# Patient Record
Sex: Male | Born: 1975 | Race: White | Hispanic: No
Health system: Southern US, Community
[De-identification: ages and names within clinical notes are randomized; demographics above are authoritative.]

## PROBLEM LIST (undated history)

## (undated) DIAGNOSIS — F1910 Other psychoactive substance abuse, uncomplicated: Secondary | ICD-10-CM

## (undated) DIAGNOSIS — R011 Cardiac murmur, unspecified: Secondary | ICD-10-CM

## (undated) DIAGNOSIS — K7460 Unspecified cirrhosis of liver: Secondary | ICD-10-CM

## (undated) DIAGNOSIS — M7110 Other infective bursitis, unspecified site: Secondary | ICD-10-CM

---

## 2012-08-23 ENCOUNTER — Emergency Department: Payer: Self-pay | Admitting: Emergency Medicine

## 2012-08-23 LAB — CBC WITH DIFFERENTIAL/PLATELET
Basophil %: 0.5 %
Eosinophil %: 0.7 %
HCT: 45.4 % (ref 40.0–52.0)
HGB: 16.1 g/dL (ref 13.0–18.0)
Lymphocyte %: 16.6 %
MCH: 32.9 pg (ref 26.0–34.0)
MCHC: 35.4 g/dL (ref 32.0–36.0)
MCV: 93 fL (ref 80–100)
Monocyte %: 10.2 %
Neutrophil #: 5.2 10*3/uL (ref 1.4–6.5)
RBC: 4.89 10*6/uL (ref 4.40–5.90)
WBC: 7.2 10*3/uL (ref 3.8–10.6)

## 2014-06-14 IMAGING — CR RIGHT HAND - COMPLETE 3+ VIEW
1 series · 3 of 3 positions shown · non-contrast
Comparison: none

REASON FOR EXAM: PAIN AND SWELLING, HX OF FB IN HAND 3 DAYS AGO
COMMENTS:

PROCEDURE:     DXR - DXR HAND RT COMPLETE W/OBLIQUES  - August 23, 2012  [DATE]
RESULT:     There is no evidence of fracture, dislocation, or malalignment.

[Series 1: pa · 0.17mm/px · 3 of 3 slices shown]
[im 1/3]
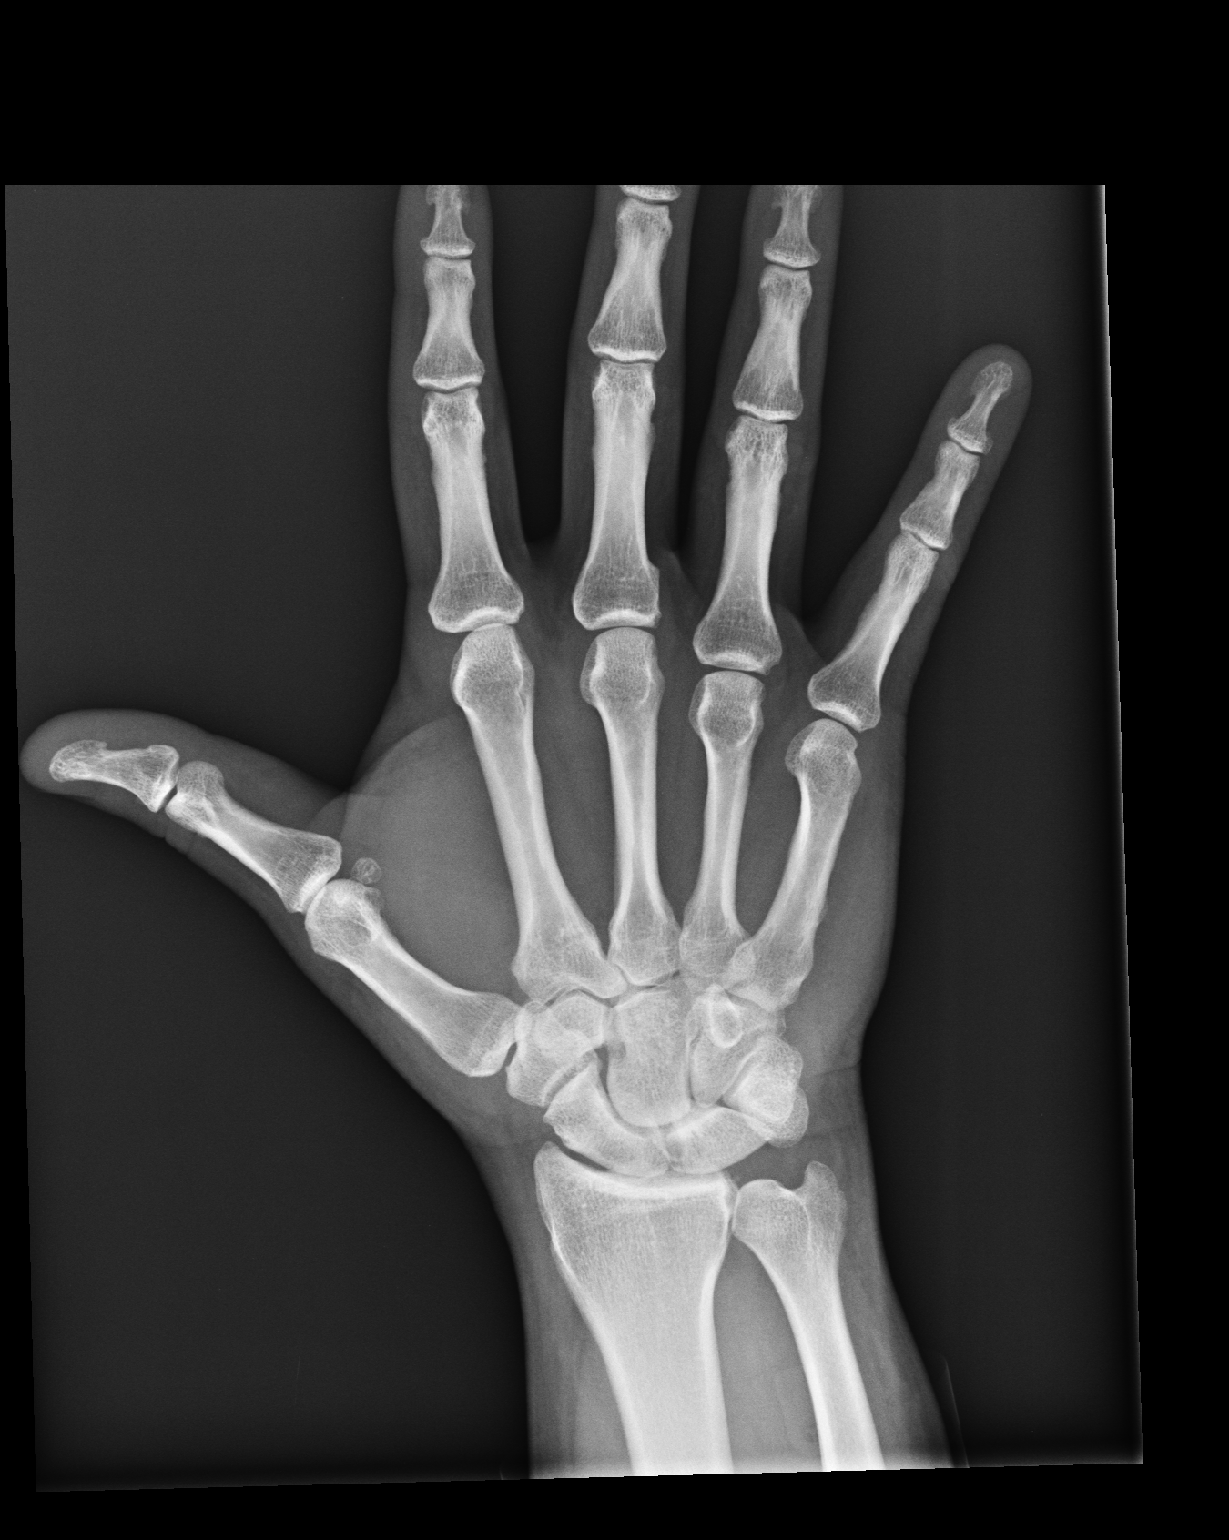
[im 2/3]
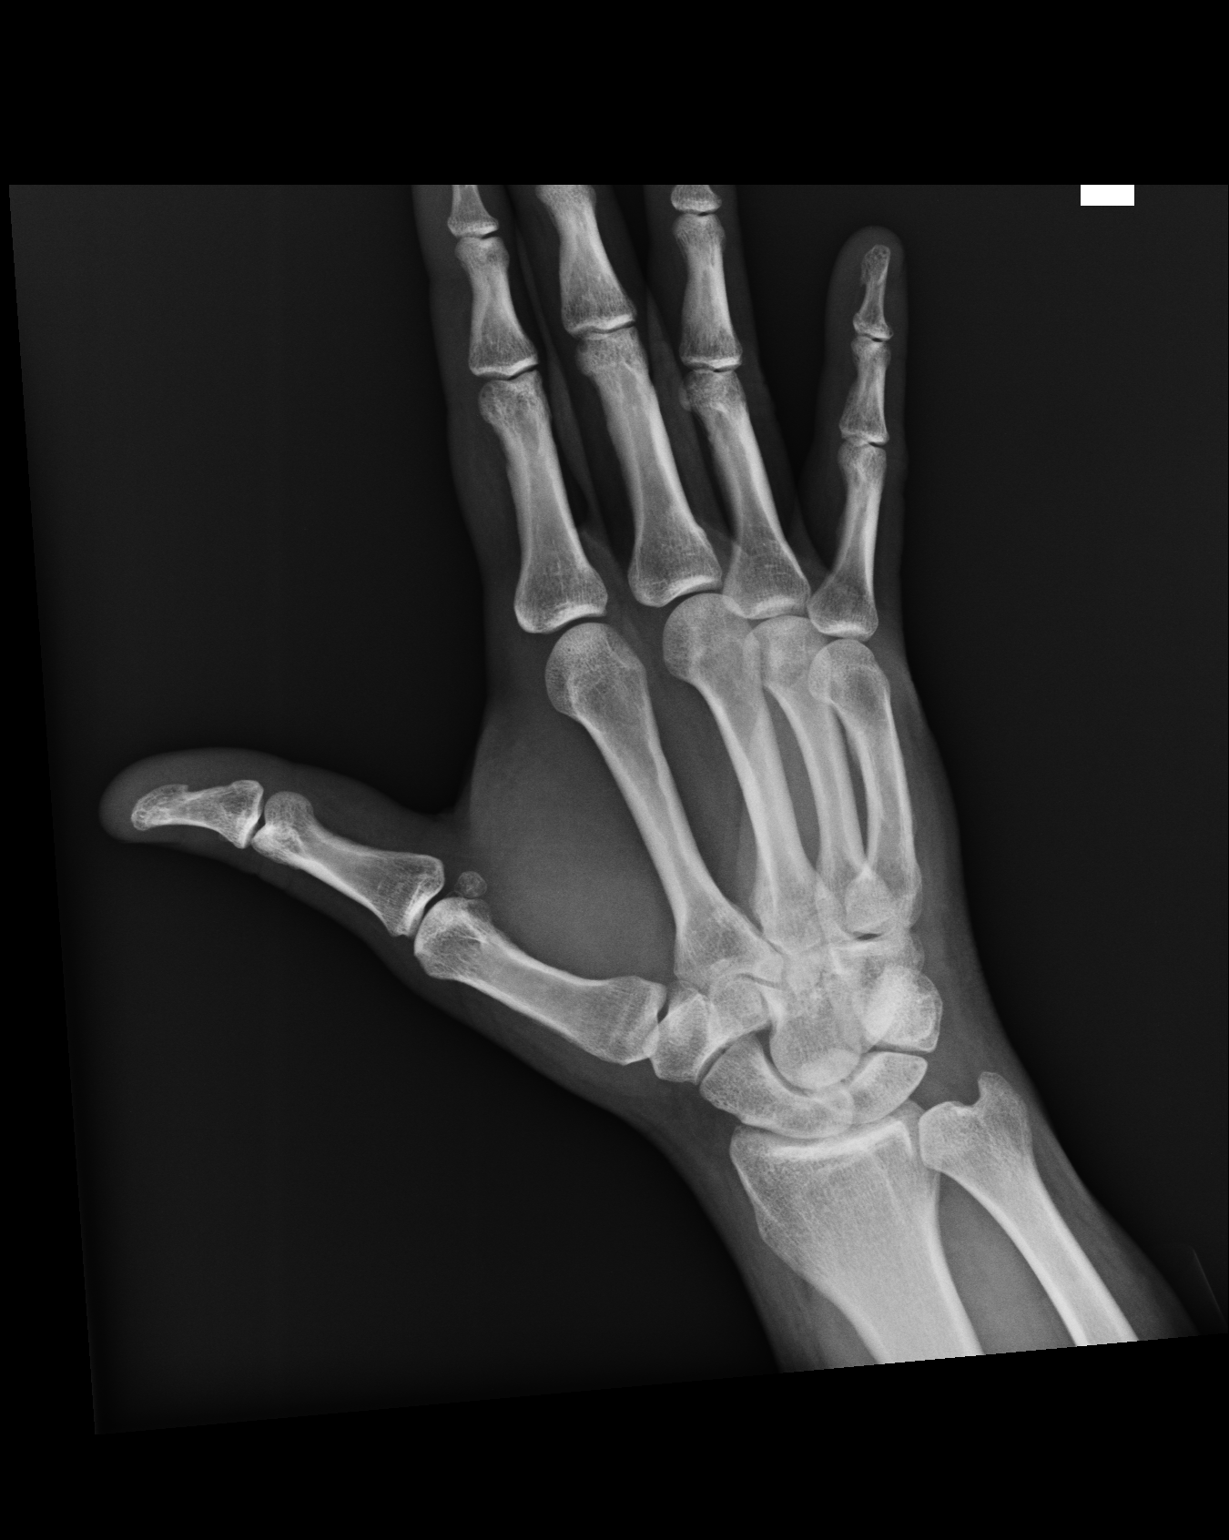
[im 3/3]
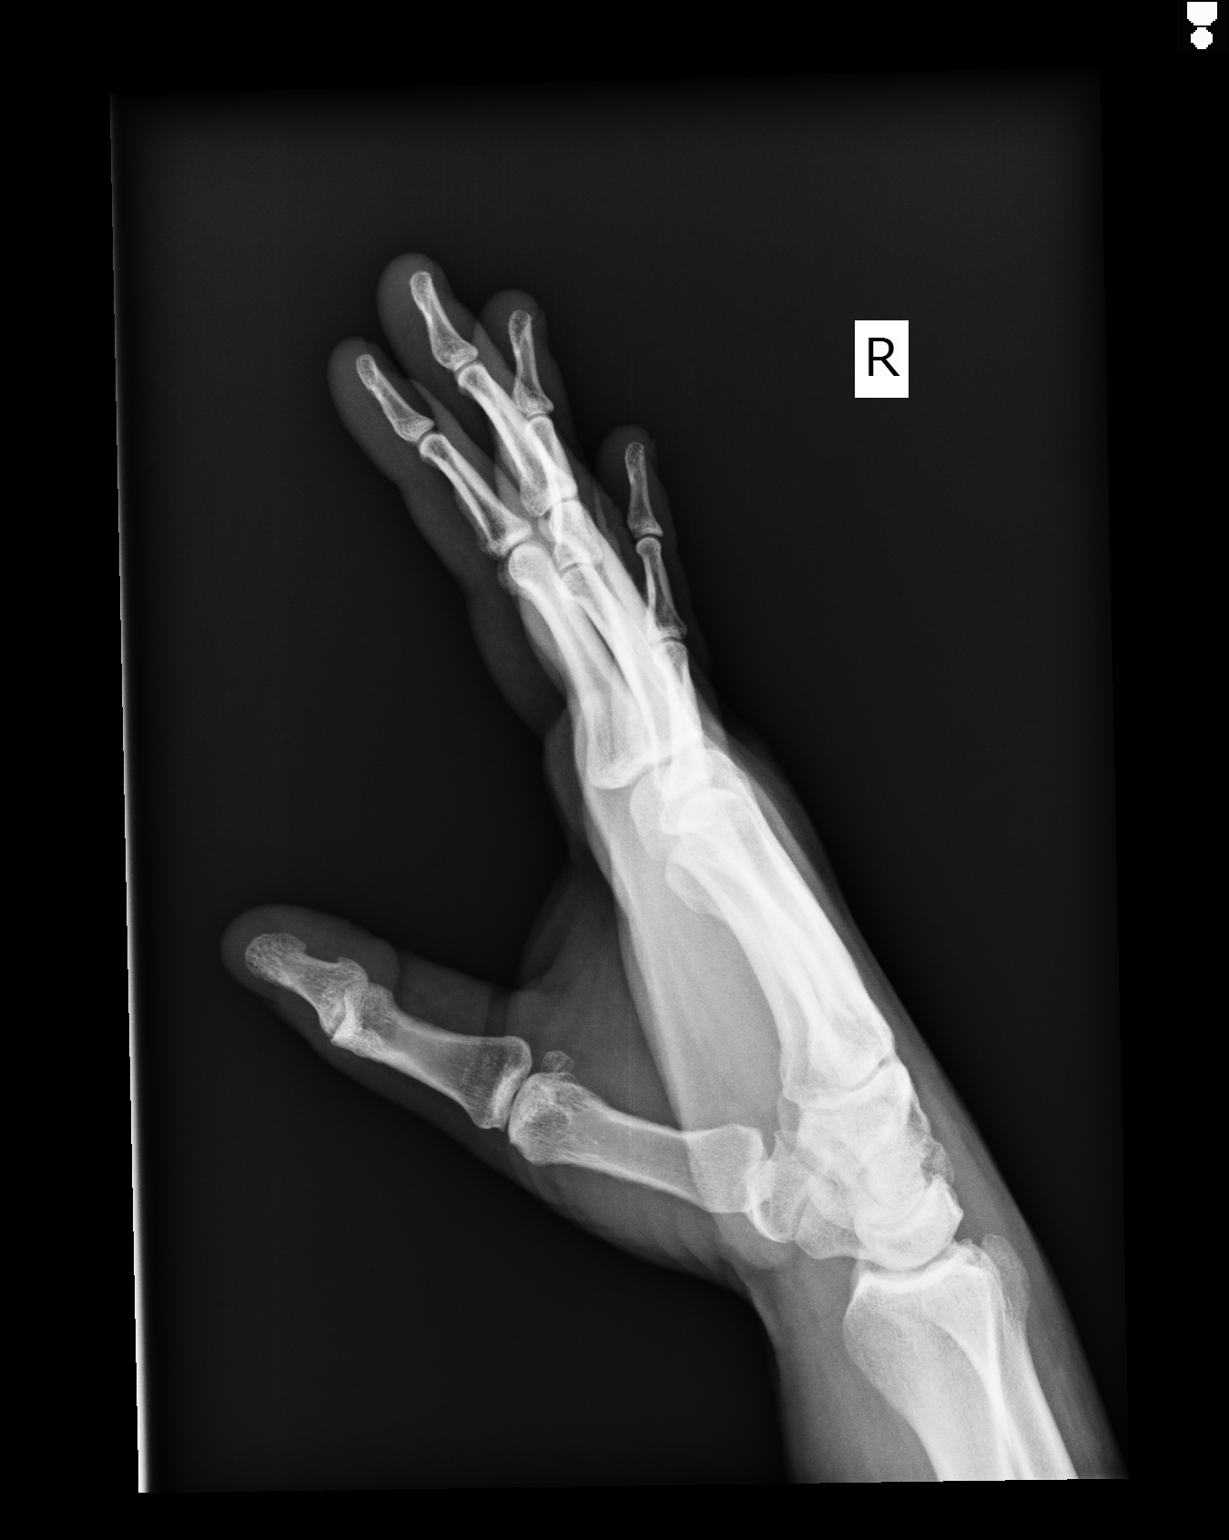

[3 of 3 positions shown; findings below may reference images not displayed]

IMPRESSION: 1. No evidence of acute abnormalities.
2. If there are persistent complaints of pain or persistent clinical
concern, a repeat evaluation in 7-10 days is recommended if clinically
warranted.

## 2022-04-19 ENCOUNTER — Other Ambulatory Visit: Payer: Self-pay

## 2022-04-19 DIAGNOSIS — K7031 Alcoholic cirrhosis of liver with ascites: Secondary | ICD-10-CM | POA: Diagnosis present

## 2022-04-19 DIAGNOSIS — F1010 Alcohol abuse, uncomplicated: Secondary | ICD-10-CM | POA: Diagnosis present

## 2022-04-19 DIAGNOSIS — D61818 Other pancytopenia: Secondary | ICD-10-CM | POA: Diagnosis present

## 2022-04-19 DIAGNOSIS — L03113 Cellulitis of right upper limb: Secondary | ICD-10-CM | POA: Diagnosis present

## 2022-04-19 DIAGNOSIS — Z880 Allergy status to penicillin: Secondary | ICD-10-CM

## 2022-04-19 DIAGNOSIS — K7682 Hepatic encephalopathy: Principal | ICD-10-CM | POA: Diagnosis present

## 2022-04-19 DIAGNOSIS — K8020 Calculus of gallbladder without cholecystitis without obstruction: Secondary | ICD-10-CM | POA: Diagnosis present

## 2022-04-19 DIAGNOSIS — F1210 Cannabis abuse, uncomplicated: Secondary | ICD-10-CM | POA: Diagnosis present

## 2022-04-19 DIAGNOSIS — I482 Chronic atrial fibrillation, unspecified: Secondary | ICD-10-CM | POA: Diagnosis present

## 2022-04-19 DIAGNOSIS — K140 Glossitis: Secondary | ICD-10-CM | POA: Diagnosis present

## 2022-04-19 DIAGNOSIS — F1510 Other stimulant abuse, uncomplicated: Secondary | ICD-10-CM | POA: Diagnosis present

## 2022-04-19 DIAGNOSIS — E876 Hypokalemia: Secondary | ICD-10-CM | POA: Diagnosis present

## 2022-04-19 DIAGNOSIS — F17210 Nicotine dependence, cigarettes, uncomplicated: Secondary | ICD-10-CM | POA: Diagnosis present

## 2022-04-20 ENCOUNTER — Other Ambulatory Visit: Payer: Self-pay

## 2022-04-20 ENCOUNTER — Inpatient Hospital Stay
Admission: EM | Admit: 2022-04-20 | Discharge: 2022-04-22 | DRG: 442 | Disposition: A | Discharge status: Home / Self Care | Payer: Self-pay | Attending: Internal Medicine | Admitting: Internal Medicine

## 2022-04-20 ENCOUNTER — Encounter: Payer: Self-pay | Admitting: Internal Medicine

## 2022-04-20 ENCOUNTER — Emergency Department: Payer: Self-pay

## 2022-04-20 ENCOUNTER — Inpatient Hospital Stay: Admit: 2022-04-20 | Payer: Self-pay

## 2022-04-20 DIAGNOSIS — R600 Localized edema: Principal | ICD-10-CM

## 2022-04-20 DIAGNOSIS — R188 Other ascites: Secondary | ICD-10-CM

## 2022-04-20 DIAGNOSIS — E876 Hypokalemia: Secondary | ICD-10-CM

## 2022-04-20 DIAGNOSIS — K8020 Calculus of gallbladder without cholecystitis without obstruction: Secondary | ICD-10-CM

## 2022-04-20 DIAGNOSIS — D61818 Other pancytopenia: Secondary | ICD-10-CM

## 2022-04-20 DIAGNOSIS — G9340 Encephalopathy, unspecified: Secondary | ICD-10-CM | POA: Diagnosis present

## 2022-04-20 DIAGNOSIS — K7682 Hepatic encephalopathy: Secondary | ICD-10-CM

## 2022-04-20 DIAGNOSIS — D649 Anemia, unspecified: Secondary | ICD-10-CM

## 2022-04-20 DIAGNOSIS — K7031 Alcoholic cirrhosis of liver with ascites: Secondary | ICD-10-CM

## 2022-04-20 HISTORY — DX: Unspecified cirrhosis of liver: K74.60

## 2022-04-20 HISTORY — DX: Cardiac murmur, unspecified: R01.1

## 2022-04-20 LAB — COMPREHENSIVE METABOLIC PANEL
ALT: 69 U/L — ABNORMAL HIGH (ref 0–44)
ALT: 65 U/L — ABNORMAL HIGH (ref 0–44)
AST: 81 U/L — ABNORMAL HIGH (ref 15–41)
AST: 73 U/L — ABNORMAL HIGH (ref 15–41)
Albumin: 2.9 g/dL — ABNORMAL LOW (ref 3.5–5.0)
Albumin: 2.7 g/dL — ABNORMAL LOW (ref 3.5–5.0)
Alkaline Phosphatase: 94 U/L (ref 38–126)
Alkaline Phosphatase: 94 U/L (ref 38–126)
Anion gap: 6 (ref 5–15)
Anion gap: 6 (ref 5–15)
BUN: 7 mg/dL (ref 6–20)
BUN: 6 mg/dL (ref 6–20)
CO2: 25 mmol/L (ref 22–32)
CO2: 26 mmol/L (ref 22–32)
Calcium: 8.3 mg/dL — ABNORMAL LOW (ref 8.9–10.3)
Calcium: 8.3 mg/dL — ABNORMAL LOW (ref 8.9–10.3)
Chloride: 107 mmol/L (ref 98–111)
Chloride: 107 mmol/L (ref 98–111)
Creatinine, Ser: 0.59 mg/dL — ABNORMAL LOW (ref 0.61–1.24)
Creatinine, Ser: 0.57 mg/dL — ABNORMAL LOW (ref 0.61–1.24)
GFR, Estimated: >60 mL/min (ref >60)
GFR, Estimated: >60 mL/min (ref >60)
Glucose, Bld: 142 mg/dL — ABNORMAL HIGH (ref 70–99)
Glucose, Bld: 114 mg/dL — ABNORMAL HIGH (ref 70–99)
Potassium: 2.9 mmol/L — ABNORMAL LOW (ref 3.5–5.1)
Potassium: 3.1 mmol/L — ABNORMAL LOW (ref 3.5–5.1)
Sodium: 138 mmol/L (ref 135–145)
Sodium: 139 mmol/L (ref 135–145)
Total Bilirubin: 5.5 mg/dL — ABNORMAL HIGH (ref 0.3–1.2)
Total Bilirubin: 5.6 mg/dL — ABNORMAL HIGH (ref 0.3–1.2)
Total Protein: 5.9 g/dL — ABNORMAL LOW (ref 6.5–8.1)
Total Protein: 5.7 g/dL — ABNORMAL LOW (ref 6.5–8.1)

## 2022-04-20 LAB — TSH: TSH: 0.833 u[IU]/mL (ref 0.350–4.500)

## 2022-04-20 LAB — CBC WITH DIFFERENTIAL/PLATELET
Abs Immature Granulocytes: 0.02 10*3/uL (ref 0.00–0.07)
Abs Immature Granulocytes: 0 10*3/uL (ref 0.00–0.07)
Basophils Absolute: 0 10*3/uL (ref 0.0–0.1)
Basophils Absolute: 0 10*3/uL (ref 0.0–0.1)
Basophils Relative: 1 %
Basophils Relative: 1 %
Eosinophils Absolute: 0.1 10*3/uL (ref 0.0–0.5)
Eosinophils Absolute: 0.1 10*3/uL (ref 0.0–0.5)
Eosinophils Relative: 4 %
Eosinophils Relative: 7 %
HCT: 29.2 % — ABNORMAL LOW (ref 39.0–52.0)
HCT: 30.5 % — ABNORMAL LOW (ref 39.0–52.0)
Hemoglobin: 10.1 g/dL — ABNORMAL LOW (ref 13.0–17.0)
Hemoglobin: 10.3 g/dL — ABNORMAL LOW (ref 13.0–17.0)
Immature Granulocytes: 1 %
Immature Granulocytes: 0 %
Lymphocytes Relative: 17 %
Lymphocytes Relative: 23 %
Lymphs Abs: 0.5 10*3/uL — ABNORMAL LOW (ref 0.7–4.0)
Lymphs Abs: 0.5 10*3/uL — ABNORMAL LOW (ref 0.7–4.0)
MCH: 35.4 pg — ABNORMAL HIGH (ref 26.0–34.0)
MCH: 35.2 pg — ABNORMAL HIGH (ref 26.0–34.0)
MCHC: 34.6 g/dL (ref 30.0–36.0)
MCHC: 33.8 g/dL (ref 30.0–36.0)
MCV: 102.5 fL — ABNORMAL HIGH (ref 80.0–100.0)
MCV: 104.1 fL — ABNORMAL HIGH (ref 80.0–100.0)
Monocytes Absolute: 0.4 10*3/uL (ref 0.1–1.0)
Monocytes Absolute: 0.2 10*3/uL (ref 0.1–1.0)
Monocytes Relative: 13 %
Monocytes Relative: 11 %
Neutro Abs: 1.9 10*3/uL (ref 1.7–7.7)
Neutro Abs: 1.2 10*3/uL — ABNORMAL LOW (ref 1.7–7.7)
Neutrophils Relative %: 64 %
Neutrophils Relative %: 58 %
Platelets: 49 10*3/uL — ABNORMAL LOW (ref 150–400)
Platelets: 41 10*3/uL — ABNORMAL LOW (ref 150–400)
RBC: 2.85 MIL/uL — ABNORMAL LOW (ref 4.22–5.81)
RBC: 2.93 MIL/uL — ABNORMAL LOW (ref 4.22–5.81)
RDW: 18.9 % — ABNORMAL HIGH (ref 11.5–15.5)
RDW: 18.9 % — ABNORMAL HIGH (ref 11.5–15.5)
WBC: 2.9 10*3/uL — ABNORMAL LOW (ref 4.0–10.5)
WBC: 2 10*3/uL — ABNORMAL LOW (ref 4.0–10.5)
nRBC: 0 % (ref 0.0–0.2)
nRBC: 0 % (ref 0.0–0.2)

## 2022-04-20 LAB — URINE DRUG SCREEN, QUALITATIVE (ARMC ONLY)
Amphetamines, Ur Screen: POSITIVE — AB
Barbiturates, Ur Screen: NOT DETECTED
Benzodiazepine, Ur Scrn: NOT DETECTED
Cannabinoid 50 Ng, Ur ~~LOC~~: POSITIVE — AB
Cocaine Metabolite,Ur ~~LOC~~: NOT DETECTED
MDMA (Ecstasy)Ur Screen: NOT DETECTED
Methadone Scn, Ur: NOT DETECTED
Opiate, Ur Screen: NOT DETECTED
Phencyclidine (PCP) Ur S: NOT DETECTED
Tricyclic, Ur Screen: NOT DETECTED

## 2022-04-20 LAB — URINALYSIS, ROUTINE W REFLEX MICROSCOPIC
Bilirubin Urine: NEGATIVE
Glucose, UA: NEGATIVE mg/dL
Hgb urine dipstick: NEGATIVE
Ketones, ur: NEGATIVE mg/dL
Leukocytes,Ua: NEGATIVE
Nitrite: NEGATIVE
Protein, ur: NEGATIVE mg/dL
Specific Gravity, Urine: 1.004 — ABNORMAL LOW (ref 1.005–1.030)
pH: 7 (ref 5.0–8.0)

## 2022-04-20 LAB — BRAIN NATRIURETIC PEPTIDE: B Natriuretic Peptide: 14.2 pg/mL (ref 0.0–100.0)

## 2022-04-20 LAB — IRON AND TIBC
Iron: 68 ug/dL (ref 45–182)
Saturation Ratios: 49 % — ABNORMAL HIGH (ref 17.9–39.5)
TIBC: 139 ug/dL — ABNORMAL LOW (ref 250–450)
UIBC: 71 ug/dL

## 2022-04-20 LAB — LIPASE, BLOOD: Lipase: 31 U/L (ref 11–51)

## 2022-04-20 LAB — ETHANOL: Alcohol, Ethyl (B): <10 mg/dL (ref <10)

## 2022-04-20 LAB — RETICULOCYTES
Immature Retic Fract: 24.5 % — ABNORMAL HIGH (ref 2.3–15.9)
RBC.: 2.94 MIL/uL — ABNORMAL LOW (ref 4.22–5.81)
Retic Count, Absolute: 143.8 10*3/uL (ref 19.0–186.0)
Retic Ct Pct: 4.9 % — ABNORMAL HIGH (ref 0.4–3.1)

## 2022-04-20 LAB — LACTIC ACID, PLASMA
Lactic Acid, Venous: 1.8 mmol/L (ref 0.5–1.9)
Lactic Acid, Venous: 1.6 mmol/L (ref 0.5–1.9)

## 2022-04-20 LAB — FOLATE: Folate: 21.3 ng/mL (ref >5.9)

## 2022-04-20 LAB — AMMONIA: Ammonia: 80 umol/L — ABNORMAL HIGH (ref 9–35)

## 2022-04-20 LAB — HIV ANTIBODY (ROUTINE TESTING W REFLEX): HIV Screen 4th Generation wRfx: NONREACTIVE

## 2022-04-20 LAB — TROPONIN I (HIGH SENSITIVITY)
Troponin I (High Sensitivity): 4 ng/L (ref <18)
Troponin I (High Sensitivity): 3 ng/L (ref <18)

## 2022-04-20 LAB — CBG MONITORING, ED: Glucose-Capillary: 86 mg/dL (ref 70–99)

## 2022-04-20 LAB — FERRITIN: Ferritin: 223 ng/mL (ref 24–336)

## 2022-04-20 LAB — VITAMIN B12: Vitamin B-12: 790 pg/mL (ref 180–914)

## 2022-04-20 MED ORDER — SODIUM CHLORIDE 0.9 % IV SOLN: INTRAVENOUS | Status: DC

## 2022-04-20 MED ORDER — FUROSEMIDE 40 MG PO TABS
40.0000 mg | ORAL_TABLET | Freq: Every day | ORAL | Status: DC
Start: 2022-04-20 — End: 2022-04-22
  Administered 2022-04-20 – 2022-04-22 (×3): 40 mg via ORAL
  Filled 2022-04-20 (×3): qty 1

## 2022-04-20 MED ORDER — SPIRONOLACTONE 25 MG PO TABS
100.0000 mg | ORAL_TABLET | Freq: Every day | ORAL | Status: DC
  Administered 2022-04-20 – 2022-04-22 (×3): 100 mg via ORAL
  Filled 2022-04-20 (×3): qty 4

## 2022-04-20 MED ORDER — ONDANSETRON HCL 4 MG PO TABS: 4.0000 mg | ORAL_TABLET | Freq: Four times a day (QID) | ORAL | Status: DC | PRN

## 2022-04-20 MED ORDER — LACTULOSE ENEMA
300.0000 mL | Freq: Three times a day (TID) | RECTAL | Status: DC
Start: 2022-04-20 — End: 2022-04-20
  Filled 2022-04-20 (×3): qty 300

## 2022-04-20 MED ORDER — LACTULOSE 10 GM/15ML PO SOLN
30.0000 g | Freq: Two times a day (BID) | ORAL | Status: DC
  Administered 2022-04-20 – 2022-04-21 (×3): 30 g via ORAL
  Administered 2022-04-22: 20 g via ORAL
  Filled 2022-04-20 (×4): qty 60

## 2022-04-20 MED ORDER — SODIUM CHLORIDE 0.9% FLUSH
3.0000 mL | Freq: Two times a day (BID) | INTRAVENOUS | Status: DC
Start: 2022-04-20 — End: 2022-04-22
  Administered 2022-04-20 – 2022-04-21 (×4): 3 mL via INTRAVENOUS

## 2022-04-20 MED ORDER — POTASSIUM CHLORIDE 10 MEQ/100ML IV SOLN
10.0000 meq | Freq: Once | INTRAVENOUS | Status: AC
  Administered 2022-04-20: 10 meq via INTRAVENOUS
  Filled 2022-04-20: qty 100

## 2022-04-20 MED ORDER — ONDANSETRON HCL 4 MG/2ML IJ SOLN: 4.0000 mg | Freq: Four times a day (QID) | INTRAMUSCULAR | Status: DC | PRN

## 2022-04-20 MED ORDER — ACETAMINOPHEN 325 MG PO TABS: 325.0000 mg | ORAL_TABLET | Freq: Once | ORAL | Status: DC

## 2022-04-20 MED ORDER — LACTULOSE ENEMA
300.0000 mL | Freq: Once | ORAL | Status: AC
  Administered 2022-04-20: 300 mL via RECTAL
  Filled 2022-04-20: qty 300

## 2022-04-20 NOTE — ED Notes (Signed)
Enema complete, pt assisted to the commode. Pt is alert at this time.

## 2022-04-20 NOTE — ED Notes (Signed)
Informed RN bed assigned 

## 2022-04-20 NOTE — ED Notes (Signed)
Hospitalist at the bedside 

## 2022-04-20 NOTE — H&P (Addendum)
History and Physical    Marco Spears GLO:[id number] DOB: 1975-11-24 DOA: 04/20/2022  PCP: Pcp, No  Patient coming from: home  I have personally briefly reviewed patient's old medical records in [facility]  Chief Complaint:  b/l leg swelling   HPI: Marco Spears is a 46 y.o. male with medical history significant of  Liver cirrhosis with ascites, hepatic encephalopathy, polysubstance abuse ( methamphetamine,marijuana ETOH), who has interim history of release for jail s/p 20 year sentence 3 weeks ago. Patient states since being out of jail he has relapsed and has abuse methamphetamine in addition to eoth. He states he has note taken his medication in 1-2 weeks. He notes he did not run out of his medications, but states he was not able to get to them. He states 2-3 days ago he has noted increase lower extremity edema that has been progressive.In addition patient also has complaint of RUQ pain. He does also endorse pain due to tongue ulcer. He denies any other medical problems at this time. He notes no chest pain, n/v/diarrhea/fever/ or chills. Due to  his progressive swelling and abdominal pain he presents to ED for further evaluation.  ED Course:  Afeb, bp 106/70, hr 88, rr 18  Sat 100% on ra  NA: 138, K 2.9, gly 142, cr 0.59, ast 81, alt 69  Lipase 31 ammonia 80 Wbc: 2.9, hgb 10.1, plt 49 RUQ u/s IMPRESSION: Changes of cirrhosis of the liver with mild ascites.   Cholelithiasis without complicating factors.   Venous dopolar negative   CT head 1. No acute finding. 2. 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   Review of Systems: As per HPI otherwise 10 point review of systems negative.   Past Medical History:  Diagnosis Date   Cirrhosis of liver (HCC)    Heart murmur     History reviewed. No pertinent surgical history. Unable to obtain at this time   reports that he has been smoking cigarettes. He has never  used smokeless tobacco. He reports current drug use. Drugs: Methamphetamines and Marijuana. He reports that he does not drink alcohol. Patient endorse etoh on my interview  Allergies  Allergen Reactions   Amoxicillin Rash    States it makes his skin turn red    History reviewed. No pertinent family history. Unable to obtain at this time   Prior to Admission medications   Not on File    Physical Exam: Vitals:   04/19/22 2319 04/19/22 2323 04/20/22 0330 04/20/22 0400  BP: 106/70  124/72 (!) 109/56  Pulse: 88  77 (!) 59  Resp: 18  (!) 27 15  Temp: 98.5 F (36.9 C)     TempSrc: Oral     SpO2: 100%  100% 99%  Weight:  104.3 kg    Height:  6\' 3"  (1.905 m)       Vitals:   04/19/22 2319 04/19/22 2323 04/20/22 0330 04/20/22 0400  BP: 106/70  124/72 (!) 109/56  Pulse: 88  77 (!) 59  Resp: 18  (!) 27 15  Temp: 98.5 F (36.9 C)     TempSrc: Oral     SpO2: 100%  100% 99%  Weight:  104.3 kg    Height:  6\' 3"  (1.905 m)    Constitutional: NAD, calm, comfortable, somnolent but arousable Eyes: PERRL, lids and conjunctivae normal ENMT: Mucous membranes are moist. Posterior pharynx clear of any exudate or lesions.Normal dentition. Tongue ulcer Neck:  normal, supple, no masses, no thyromegaly Respiratory: clear to auscultation bilaterally, no wheezing, no crackles. Normal respiratory effort. No accessory muscle use.  Cardiovascular: Regular rate and rhythm, no murmurs / rubs / gallops. No+ 3+ b/l edema lower extremity edema.  Abdomen: no tenderness, no masses palpated. No hepatosplenomegaly. Bowel sounds positive. No significant distention noted Musculoskeletal: no clubbing / cyanosis. No joint deformity upper and lower extremities. Good ROM, no contractures. Normal muscle tone.  Skin: no rashes, lesions, ulcers. No induration Neurologic: somnolent,CN 2-12 grossly intact. Sensation intact, . Strength 5/5 in all 4.  Psychiatric: Normal judgment and insight. Alert and oriented x 3. Normal  mood.    Labs on Admission: I have personally reviewed following labs and imaging studies  CBC: Recent Labs  Lab 04/19/22 2327  WBC 2.9*  NEUTROABS 1.9  HGB 10.1*  HCT 29.2*  MCV 102.5*  PLT 49*   Basic Metabolic Panel: Recent Labs  Lab 04/19/22 2327  NA 138  K 2.9*  CL 107  CO2 25  GLUCOSE 142*  BUN 7  CREATININE 0.59*  CALCIUM 8.3*   GFR: Estimated Creatinine Clearance: 147.6 mL/min (A) (by C-G formula based on SCr of 0.59 mg/dL (L)). Liver Function Tests: Recent Labs  Lab 04/19/22 2327  AST 81*  ALT 69*  ALKPHOS 94  BILITOT 5.5*  PROT 5.9*  ALBUMIN 2.9*   Recent Labs  Lab 04/19/22 2327  LIPASE 31   Recent Labs  Lab 04/19/22 2327  AMMONIA 80*   Coagulation Profile: No results for input(s): "INR", "PROTIME" in the last 168 hours. Cardiac Enzymes: No results for input(s): "CKTOTAL", "CKMB", "CKMBINDEX", "TROPONINI" in the last 168 hours. BNP (last 3 results) No results for input(s): "PROBNP" in the last 8760 hours. HbA1C: No results for input(s): "HGBA1C" in the last 72 hours. CBG: No results for input(s): "GLUCAP" in the last 168 hours. Lipid Profile: No results for input(s): "CHOL", "HDL", "LDLCALC", "TRIG", "CHOLHDL", "LDLDIRECT" in the last 72 hours. Thyroid Function Tests: No results for input(s): "TSH", "T4TOTAL", "FREET4", "T3FREE", "THYROIDAB" in the last 72 hours. Anemia Panel: No results for input(s): "VITAMINB12", "FOLATE", "FERRITIN", "TIBC", "IRON", "RETICCTPCT" in the last 72 hours. Urine analysis: No results found for: "COLORURINE", "APPEARANCEUR", "LABSPEC", "PHURINE", "GLUCOSEU", "HGBUR", "BILIRUBINUR", "KETONESUR", "PROTEINUR", "UROBILINOGEN", "NITRITE", "LEUKOCYTESUR"  Radiological Exams on Admission: CT Head Wo Contrast  Result Date: 04/20/2022 CLINICAL DATA:  Mental status change with unknown cause. Leg swelling and right upper quadrant pain. Recent drug abuse. EXAM: CT HEAD WITHOUT CONTRAST TECHNIQUE: Contiguous axial  images were obtained from the base of the skull through the vertex without intravenous contrast. RADIATION DOSE REDUCTION: This exam was performed according to the departmental dose-optimization program which includes automated exposure control, adjustment of the mA and/or kV according to patient size and/or use of iterative reconstruction technique. COMPARISON:  None Available. FINDINGS: Brain: Encephalomalacia in the left more than right anterior and inferior frontal lobes and in the medial left temporal lobe, a posttraumatic pattern. No evidence of acute infarct, hemorrhage, hydrocephalus, or mass. Vascular: No hyperdense vessel or unexpected calcification. Skull: Normal. Negative for fracture or focal lesion. Sinuses/Orbits: No acute finding. IMPRESSION: 1. No acute finding. 2. 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 Electronically Signed   By: Tiburcio Pea M.D.   On: 04/20/2022 05:06   US Venous Img Lower Bilateral (DVT)  Result Date: 04/20/2022 CLINICAL DATA:  Peripheral swelling for 1 day EXAM: BILATERAL LOWER EXTREMITY VENOUS DOPPLER ULTRASOUND TECHNIQUE: Gray-scale sonography  with graded compression, as well as color Doppler and duplex ultrasound were performed to evaluate the lower extremity deep venous systems from the level of the common femoral vein and including the common femoral, femoral, profunda femoral, popliteal and calf veins including the posterior tibial, peroneal and gastrocnemius veins when visible. The superficial great saphenous vein was also interrogated. Spectral Doppler was utilized to evaluate flow at rest and with distal augmentation maneuvers in the common femoral, femoral and popliteal veins. COMPARISON:  None Available. FINDINGS: RIGHT LOWER EXTREMITY Common Femoral Vein: No evidence of thrombus. Normal compressibility, respiratory phasicity and response to augmentation. Saphenofemoral Junction: No evidence of  thrombus. Normal compressibility and flow on color Doppler imaging. Profunda Femoral Vein: No evidence of thrombus. Normal compressibility and flow on color Doppler imaging. Femoral Vein: No evidence of thrombus. Normal compressibility, respiratory phasicity and response to augmentation. Popliteal Vein: No evidence of thrombus. Normal compressibility, respiratory phasicity and response to augmentation. Calf Veins: No evidence of thrombus. Normal compressibility and flow on color Doppler imaging. Superficial Great Saphenous Vein: No evidence of thrombus. Normal compressibility. Venous Reflux:  None. Other Findings:  None. LEFT LOWER EXTREMITY Common Femoral Vein: No evidence of thrombus. Normal compressibility, respiratory phasicity and response to augmentation. Saphenofemoral Junction: No evidence of thrombus. Normal compressibility and flow on color Doppler imaging. Profunda Femoral Vein: No evidence of thrombus. Normal compressibility and flow on color Doppler imaging. Femoral Vein: No evidence of thrombus. Normal compressibility, respiratory phasicity and response to augmentation. Popliteal Vein: No evidence of thrombus. Normal compressibility, respiratory phasicity and response to augmentation. Calf Veins: No evidence of thrombus. Normal compressibility and flow on color Doppler imaging. Superficial Great Saphenous Vein: No evidence of thrombus. Normal compressibility. Venous Reflux:  None. Other Findings:  None. IMPRESSION: No evidence of deep venous thrombosis in either lower extremity. Electronically Signed   By: Alcide Clever M.D.   On: 04/20/2022 02:37   US ABDOMEN LIMITED RUQ (LIVER/GB)  Result Date: 04/20/2022 CLINICAL DATA:  History of cirrhosis EXAM: ULTRASOUND ABDOMEN LIMITED RIGHT UPPER QUADRANT COMPARISON:  None Available. FINDINGS: Gallbladder: Cholelithiasis is noted without wall thickening or pericholecystic fluid. Common bile duct: Diameter: 3 mm Liver: Increased echogenicity is noted  consistent with the given clinical history of cirrhosis. No focal mass is noted. Portal vein is patent on color Doppler imaging with normal direction of blood flow towards the liver. Other: Mild ascites is noted in the left upper quadrant IMPRESSION: Changes of cirrhosis of the liver with mild ascites. Cholelithiasis without complicating factors. Electronically Signed   By: Alcide Clever M.D.   On: 04/20/2022 02:33    EKG: Independently reviewed. Atrial fib   Assessment/Plan  Acute hepatic encephalopathy  -continue with lactulose enema -neuro checks   Liver cirrhosis with ascites -resume lasix/ spironolactone   Polysubstance abuse Encourage cessation   Pancytopenia -platelet 49, hgb 10.1  -due to liver disease   Afib with CVR -noted on EKG  -unclear if new or old patient currently note able to give history -CHADS1 -consider cardiology -echo  for baseline   DVT prophylaxis: heparin Code Status: full Family Communication: none at bedside Disposition Plan: patient  expected to be admitted greater than 2 midnights  Consults called: consider gi consult in am  Admission status: progressive    Lurline Del MD [facility]   If 7PM-7AM, please contact night-coverage [email or web address] Password [facility]  04/20/2022, 5:40 AM

## 2022-04-21 ENCOUNTER — Inpatient Hospital Stay (HOSPITAL_COMMUNITY)
Admit: 2022-04-21 | Discharge: 2022-04-21 | Disposition: A | Discharge status: Home / Self Care | Payer: Self-pay | Attending: Internal Medicine | Admitting: Internal Medicine

## 2022-04-21 DIAGNOSIS — D61818 Other pancytopenia: Secondary | ICD-10-CM

## 2022-04-21 DIAGNOSIS — K7031 Alcoholic cirrhosis of liver with ascites: Secondary | ICD-10-CM

## 2022-04-21 DIAGNOSIS — E876 Hypokalemia: Secondary | ICD-10-CM

## 2022-04-21 DIAGNOSIS — R0609 Other forms of dyspnea: Secondary | ICD-10-CM

## 2022-04-21 DIAGNOSIS — K7682 Hepatic encephalopathy: Secondary | ICD-10-CM

## 2022-04-21 DIAGNOSIS — K8020 Calculus of gallbladder without cholecystitis without obstruction: Secondary | ICD-10-CM

## 2022-04-21 DIAGNOSIS — D649 Anemia, unspecified: Secondary | ICD-10-CM

## 2022-04-21 LAB — COMPREHENSIVE METABOLIC PANEL
ALT: 54 U/L — ABNORMAL HIGH (ref 0–44)
AST: 54 U/L — ABNORMAL HIGH (ref 15–41)
Albumin: 2.3 g/dL — ABNORMAL LOW (ref 3.5–5.0)
Alkaline Phosphatase: 87 U/L (ref 38–126)
Anion gap: 4 — ABNORMAL LOW (ref 5–15)
BUN: 6 mg/dL (ref 6–20)
CO2: 26 mmol/L (ref 22–32)
Calcium: 8.2 mg/dL — ABNORMAL LOW (ref 8.9–10.3)
Chloride: 111 mmol/L (ref 98–111)
Creatinine, Ser: 0.66 mg/dL (ref 0.61–1.24)
GFR, Estimated: >60 mL/min (ref >60)
Glucose, Bld: 110 mg/dL — ABNORMAL HIGH (ref 70–99)
Potassium: 3.4 mmol/L — ABNORMAL LOW (ref 3.5–5.1)
Sodium: 141 mmol/L (ref 135–145)
Total Bilirubin: 4.5 mg/dL — ABNORMAL HIGH (ref 0.3–1.2)
Total Protein: 5 g/dL — ABNORMAL LOW (ref 6.5–8.1)

## 2022-04-21 LAB — CBC WITH DIFFERENTIAL/PLATELET
Abs Immature Granulocytes: 0.01 10*3/uL (ref 0.00–0.07)
Basophils Absolute: 0 10*3/uL (ref 0.0–0.1)
Basophils Relative: 0 %
Eosinophils Absolute: 0.1 10*3/uL (ref 0.0–0.5)
Eosinophils Relative: 4 %
HCT: 27.9 % — ABNORMAL LOW (ref 39.0–52.0)
Hemoglobin: 9.7 g/dL — ABNORMAL LOW (ref 13.0–17.0)
Immature Granulocytes: 0 %
Lymphocytes Relative: 19 %
Lymphs Abs: 0.5 10*3/uL — ABNORMAL LOW (ref 0.7–4.0)
MCH: 36.1 pg — ABNORMAL HIGH (ref 26.0–34.0)
MCHC: 34.8 g/dL (ref 30.0–36.0)
MCV: 103.7 fL — ABNORMAL HIGH (ref 80.0–100.0)
Monocytes Absolute: 0.4 10*3/uL (ref 0.1–1.0)
Monocytes Relative: 13 %
Neutro Abs: 1.8 10*3/uL (ref 1.7–7.7)
Neutrophils Relative %: 64 %
Platelets: 54 10*3/uL — ABNORMAL LOW (ref 150–400)
RBC: 2.69 MIL/uL — ABNORMAL LOW (ref 4.22–5.81)
RDW: 19.2 % — ABNORMAL HIGH (ref 11.5–15.5)
WBC: 2.8 10*3/uL — ABNORMAL LOW (ref 4.0–10.5)
nRBC: 0 % (ref 0.0–0.2)

## 2022-04-21 LAB — ECHOCARDIOGRAM COMPLETE
AR max vel: 3.23 cm2
AV Area VTI: 3.67 cm2
AV Area mean vel: 3.09 cm2
AV Mean grad: 8 mmHg
AV Peak grad: 12.8 mmHg
Ao pk vel: 1.79 m/s
Area-P 1/2: 2.85 cm2
S' Lateral: 3 cm
Weight: 3728.42 oz

## 2022-04-21 LAB — MAGNESIUM: Magnesium: 1.8 mg/dL (ref 1.7–2.4)

## 2022-04-21 LAB — GLUCOSE, CAPILLARY: Glucose-Capillary: 105 mg/dL — ABNORMAL HIGH (ref 70–99)

## 2022-04-21 MED ORDER — POTASSIUM CHLORIDE 20 MEQ PO PACK
40.0000 meq | PACK | ORAL | Status: AC
  Administered 2022-04-21 (×2): 40 meq via ORAL
  Filled 2022-04-21 (×2): qty 2

## 2022-04-21 MED ORDER — DOXYCYCLINE HYCLATE 100 MG PO TABS
100.0000 mg | ORAL_TABLET | Freq: Two times a day (BID) | ORAL | Status: DC
  Administered 2022-04-21 – 2022-04-22 (×3): 100 mg via ORAL
  Filled 2022-04-21 (×3): qty 1

## 2022-04-21 NOTE — TOC Initial Note (Addendum)
Transition of Care [facility]) - Initial/Assessment Note    Patient Details  Name: Marco Spears MRN: [id number] Date of Birth: 1976/01/11  [facility]) CM/SW Contact:    Caryn Section, RN Phone Number: 04/21/2022, 2:17 PM  Clinical Narrative:     Patient states he was just released from jail and the only ID he has is his card from jail and a medicaid card.  He states someone named Dolphus Jenny who works at a [employer] has his information, but he has no way to contact her and she does not know he is in the hospital.    Patient states he has a brother, who "is in a bad way" and  is renting a room on [address]. In [place].  Brother is reportedly not doing well and he can only have one visitor a month.  Patient stayed there and will not be permitted to return to stay overnight.  He does, however plan to take a taxi to see brother on discharge.  Patient is not aware of any homeless shelters, as he states he was just released.    Information for [facility]-as patient does not have PCP, and homeless shelters provided to patient by [facility].  RNCM will attempt to contact Ms. Amie Critchley if information can be located.    TOC to continue to attempt to connect resources and follow for needs.   1434 Addendum: [facility] [place] ([address] [place], [place] [address] Hours: Open 24 hours Phone: [phone]), there is a waiting list of 3-4 weeks at this time for long term stay.  (They also have a detox program of 7 days, they do not have a meth program).    Patient will not need insurance for the long term program, and if he is willing to wait 3-4 weeks, they will screen patient, but he will have to call once per week to stay on waiting list, but if he fails to call even once, he will be removed from list. RNCM will give this information to patient, however he is sleeping at this time.     RNCM also explained that patient will need to go  to [facility] for identification if unable to retrieve from friend.               Expected Discharge Plan: Homeless Shelter Barriers to Discharge: Homeless with medical needs   Patient Goals and CMS Choice     Choice offered to / list presented to : NA  Expected Discharge Plan and Services Expected Discharge Plan: Homeless Shelter In-house Referral: Artist Discharge Planning Services: CM Consult, Medication Assistance   Living arrangements for the past 2 months:  (homeless)                                      Prior Living Arrangements/Services Living arrangements for the past 2 months:  (homeless) Lives with:: Other (Comment) (homeless) Patient language and need for interpreter reviewed:: Yes        Need for Family Participation in Patient Care: Yes (Comment) Care giver support system in place?: Yes (comment)   Criminal Activity/Legal Involvement Pertinent to Current Situation/Hospitalization: No - Comment as needed  Activities of Daily Living Home Assistive Devices/Equipment: None ADL Screening (condition at time of admission) Patient's cognitive ability adequate to safely complete daily activities?: Yes Is the patient deaf  or have difficulty hearing?: No Does the patient have difficulty seeing, even when wearing glasses/contacts?: No Does the patient have difficulty concentrating, remembering, or making decisions?: No Patient able to express need for assistance with ADLs?: Yes Does the patient have difficulty dressing or bathing?: No Independently performs ADLs?: Yes (appropriate for developmental age) Does the patient have difficulty walking or climbing stairs?: No Weakness of Legs: None Weakness of Arms/Hands: None  Permission Sought/Granted Permission sought to share information with : Case Manager Permission granted to share information with : Yes, Release of Information Signed              Emotional Assessment   Attitude/Demeanor/Rapport:  Gracious Affect (typically observed): Apprehensive Orientation: : Oriented to Self, Oriented to Place Alcohol / Substance Use: Alcohol Use, Illicit Drugs Psych Involvement: No (comment)  Admission diagnosis:  Hepatic encephalopathy (HCC) [K76.82] Hypokalemia [E87.6] Encephalopathy acute [G93.40] Bilateral lower extremity edema [R60.0] Cirrhosis of liver with ascites, unspecified hepatic cirrhosis type (HCC) [K74.60, R18.8] Patient Active Problem List   Diagnosis Date Noted   Acute hepatic encephalopathy (HCC) 04/21/2022   Alcoholic cirrhosis of liver with ascites (HCC) 04/21/2022   Hypokalemia 04/21/2022   Pancytopenia (HCC) 04/21/2022   Cholelithiasis 04/21/2022   Encephalopathy acute 04/20/2022   PCP:  Pcp, No Pharmacy:   [facility] [address] [place] [address] Phone: [phone] Fax: [phone]     Social Determinants of Health (SDOH) Interventions    Readmission Risk Interventions     No data to display

## 2022-04-22 ENCOUNTER — Other Ambulatory Visit: Payer: Self-pay

## 2022-04-22 LAB — CBC
HCT: 28.7 % — ABNORMAL LOW (ref 39.0–52.0)
Hemoglobin: 9.7 g/dL — ABNORMAL LOW (ref 13.0–17.0)
MCH: 35.5 pg — ABNORMAL HIGH (ref 26.0–34.0)
MCHC: 33.8 g/dL (ref 30.0–36.0)
MCV: 105.1 fL — ABNORMAL HIGH (ref 80.0–100.0)
Platelets: 49 10*3/uL — ABNORMAL LOW (ref 150–400)
RBC: 2.73 MIL/uL — ABNORMAL LOW (ref 4.22–5.81)
RDW: 18.9 % — ABNORMAL HIGH (ref 11.5–15.5)
WBC: 2.4 10*3/uL — ABNORMAL LOW (ref 4.0–10.5)
nRBC: 0 % (ref 0.0–0.2)

## 2022-04-22 LAB — BASIC METABOLIC PANEL
Anion gap: 5 (ref 5–15)
BUN: 7 mg/dL (ref 6–20)
CO2: 25 mmol/L (ref 22–32)
Calcium: 8.2 mg/dL — ABNORMAL LOW (ref 8.9–10.3)
Chloride: 109 mmol/L (ref 98–111)
Creatinine, Ser: 0.73 mg/dL (ref 0.61–1.24)
GFR, Estimated: >60 mL/min (ref >60)
Glucose, Bld: 141 mg/dL — ABNORMAL HIGH (ref 70–99)
Potassium: 3.8 mmol/L (ref 3.5–5.1)
Sodium: 139 mmol/L (ref 135–145)

## 2022-04-22 LAB — GLUCOSE, CAPILLARY: Glucose-Capillary: 132 mg/dL — ABNORMAL HIGH (ref 70–99)

## 2022-04-22 LAB — MAGNESIUM: Magnesium: 1.8 mg/dL (ref 1.7–2.4)

## 2022-04-22 MED ORDER — NYSTATIN 100000 UNIT/ML MT SUSP
5.0000 mL | Freq: Three times a day (TID) | OROMUCOSAL | 0 refills | Status: DC | PRN
  Filled 2022-04-22 (×2): qty 100, 7d supply, fill #0

## 2022-04-22 MED ORDER — FUROSEMIDE 40 MG PO TABS
40.0000 mg | ORAL_TABLET | Freq: Every day | ORAL | 0 refills | Status: DC
  Filled 2022-04-22: qty 30, 30d supply, fill #0

## 2022-04-22 MED ORDER — LACTULOSE 10 GM/15ML PO SOLN
30.0000 g | Freq: Two times a day (BID) | ORAL | 0 refills | Status: DC
  Filled 2022-04-22: qty 946, 11d supply, fill #0

## 2022-04-22 MED ORDER — SPIRONOLACTONE 100 MG PO TABS
100.0000 mg | ORAL_TABLET | Freq: Every day | ORAL | 0 refills | Status: DC
  Filled 2022-04-22: qty 30, 30d supply, fill #0

## 2022-04-22 MED ORDER — DOXYCYCLINE HYCLATE 100 MG PO TABS
100.0000 mg | ORAL_TABLET | Freq: Two times a day (BID) | ORAL | 0 refills | Status: AC
  Filled 2022-04-22: qty 10, 5d supply, fill #0

## 2022-04-22 MED ORDER — MAGIC MOUTHWASH W/LIDOCAINE: 5.0000 mL | Freq: Three times a day (TID) | ORAL | Status: DC | PRN

## 2022-04-22 MED ORDER — LACTULOSE 10 GM/15ML PO SOLN
30.0000 g | Freq: Two times a day (BID) | ORAL | 0 refills | Status: DC
  Filled 2022-04-22: qty 236, 3d supply, fill #0

## 2022-04-22 NOTE — Consult Note (Signed)
WOC Nurse wound follow up Wound type: no open wound Dressing procedure/placement/frequency: Bilateral unna boots applied for edema management. Pt to discharge today.    Discharge plans should include aggressive follow-up for compression therapy. Appears pt discharging to homeless shelter.  Student WOC- Leslye Peer, RN, [facility] Melody Eliberto Ivory MSN, RN, CWOCN, CNS, [employer]

## 2022-04-22 NOTE — Discharge Summary (Signed)
Physician Discharge Summary   Patient: Marco Spears MRN: [id number] DOB: Jun 12, 1976  Admit date:     04/20/2022  Discharge date: 04/22/22  Discharge Physician: Marrion Coy   PCP: Pcp, No   Recommendations at discharge:   [facility].  Social service has arranged it. Check-in to homeless shelter arranged by social services. Follow-up with GI in 2 weeks.  Discharge Diagnoses: Principal Problem:   Encephalopathy acute Active Problems:   Acute hepatic encephalopathy (HCC)   Alcoholic cirrhosis of liver with ascites (HCC)   Hypokalemia   Pancytopenia (HCC)   Cholelithiasis  Resolved Problems:   * No resolved hospital problems. *  Hospital Course: Marco Spears is a 46 y.o. male with medical history significant of  Liver cirrhosis with ascites, hepatic encephalopathy, polysubstance abuse ( methamphetamine,marijuana ETOH), who has interim history of release for jail s/p 20 year sentence 3 weeks ago. Patient states since being out of jail he has relapsed and has abuse methamphetamine in addition to eoth.  Patient came to the hospital complaining of right lower quadrant abdominal pain and leg edema. Patient also had some confusion with elevated ammonia at 80, started lactulose. Right upper quadrant ultrasound showed liver cirrhosis, cholelithiasis without cholecystitis.  Patient is diagnosed with hepatic encephalopathy, was treated with lactulose.  Patient has been having daily bowel movements, mental status had improved.  Currently he is medically stable to be discharged Patient also had echocardiogram performed in this admission, ejection fraction 65 to 70%, no significant valvular disease.  Assessment and Plan: Hepatic encephalopathy. Alcohol liver cirrhosis with ascites. Status seem to be improving, continue lactulose. Right upper quadrant ultrasound showed cholelithiasis without cholecystitis. Currently condition has been stabilized. I will  prescribe the lactulose, sent to the [facility] pharmacy.  He is advised to adhere to the treatment regimen.  I will also arrange for follow-up with GI as outpatient.  Pancytopenia secondary to liver cirrhosis. Continue diuretics with 40 mg of Lasix and 100 mg Aldactone.  Follow-up with GI as outpatient.   Hypokalemia. Condition improved, magnesium also normal.  Polysubstance abuse. Advised to quit.  Right hand small area of cellulitis. Patient had a injury to the right palm previously, still has some redness.  I will treat her with 5 days of doxycycline. Patient also has bilateral arms small lesions, appear to be caused by scratching of the skin.  Does not appear to be infected.   Chronic atrial fibrillation. No need for anticoagulation.  [facility]          Consultants: None Procedures performed: None  Disposition: Home Diet recommendation:  Discharge Diet Orders (From admission, onward)     Start     Ordered   04/22/22 0000  Diet - low sodium heart healthy        04/22/22 1003           Cardiac diet DISCHARGE MEDICATION: Allergies as of 04/22/2022       Reactions   Amoxicillin Rash   States it makes his skin turn red        Medication List     TAKE these medications    doxycycline 100 MG tablet Commonly known as: VIBRA-TABS Take 1 tablet (100 mg total) by mouth every 12 (twelve) hours for 5 days.   furosemide 40 MG tablet Commonly known as: LASIX Take 1 tablet (40 mg total) by mouth daily. Start taking on: April 23, 2022   lactulose 10  GM/15ML solution Commonly known as: CHRONULAC Take 45 mLs (30 g total) by mouth 2 (two) times daily.   magic mouthwash w/lidocaine Soln Take 5 mLs by mouth 3 (three) times daily as needed for mouth pain.   spironolactone 100 MG tablet Commonly known as: ALDACTONE Take 1 tablet (100 mg total) by mouth daily. Start taking on: April 23, 2022               Discharge Care  Instructions  (From admission, onward)           Start     Ordered   04/22/22 0000  Discharge wound care:       Comments: Cover right wrist wound with single layer of xeroform, top with dry dressing Unna boots   04/22/22 1003            Follow-up Information     Vanga, Loel Dubonnet, MD Follow up in 2 week(s).   Specialty: Gastroenterology Contact information: [address] [place] [address] [phone]                Discharge Exam: Ceasar Mons Weights   04/19/22 2323 04/21/22 0500  Weight: 104.3 kg 105.7 kg   General exam: Appears calm and comfortable  Respiratory system: Clear to auscultation. Respiratory effort normal. Cardiovascular system: S1 & S2 heard, RRR. No JVD, murmurs, rubs, gallops or clicks. No pedal edema. Gastrointestinal system: Abdomen is nondistended, soft and nontender. No organomegaly or masses felt. Normal bowel sounds heard. Central nervous system: Alert and oriented. No focal neurological deficits. Extremities: Symmetric 5 x 5 power. Skin: bilateral arm lesions appeared due to scratches. Right hand/wrist small area of redness Psychiatry: Judgement and insight appear normal. Mood & affect appropriate.    Condition at discharge: good  The results of significant diagnostics from this hospitalization (including imaging, microbiology, ancillary and laboratory) are listed below for reference.   Imaging Studies: ECHOCARDIOGRAM COMPLETE  Result Date: 04/21/2022    ECHOCARDIOGRAM REPORT   Patient Name:   Marco Spears Date of Exam: 04/21/2022 Medical Rec #:  [id number]       Height:       75.0 in Accession #:    [id number]      Weight:       233.0 lb Date of Birth:  Oct 04, 1976       BSA:          2.342 m Patient Age:    46 years        BP:           116/63 mmHg Patient Gender: M               HR:           89 bpm. Exam Location:  [facility] Procedure: 2D Echo, Color Doppler and Cardiac Doppler Indications:     Dyspnea R06.00  History:          Patient has no prior history of Echocardiogram examinations.                  Signs/Symptoms:Murmur.  Sonographer:     Cristela Blue Referring Phys:  [id number] SARA-MAIZ A THOMAS Diagnosing Phys: Yvonne Kendall MD  Sonographer Comments: Suboptimal apical window. IMPRESSIONS  1. Left ventricular ejection fraction, by estimation, is 65 to 70%. The left ventricle has normal function. The left ventricle has no regional wall motion abnormalities. Left ventricular diastolic parameters were normal.  2. Right ventricular systolic function is normal. The right ventricular size  is normal. Tricuspid regurgitation signal is inadequate for assessing PA pressure.  3. The mitral valve is normal in structure. Trivial mitral valve regurgitation. No evidence of mitral stenosis.  4. The aortic valve has an indeterminant number of cusps. There is mild thickening of the aortic valve. Aortic valve regurgitation is not visualized. Aortic valve sclerosis is present, with no evidence of aortic valve stenosis. FINDINGS  Left Ventricle: Left ventricular ejection fraction, by estimation, is 65 to 70%. The left ventricle has normal function. The left ventricle has no regional wall motion abnormalities. The left ventricular internal cavity size was normal in size. There is  borderline left ventricular hypertrophy. Left ventricular diastolic parameters were normal. Right Ventricle: The right ventricular size is normal. No increase in right ventricular wall thickness. Right ventricular systolic function is normal. Tricuspid regurgitation signal is inadequate for assessing PA pressure. Left Atrium: Left atrial size was normal in size. Right Atrium: Right atrial size was normal in size. Pericardium: The pericardium was not well visualized. Mitral Valve: The mitral valve is normal in structure. Trivial mitral valve regurgitation. No evidence of mitral valve stenosis. Tricuspid Valve: The tricuspid valve is normal in structure. Tricuspid valve  regurgitation is trivial. Aortic Valve: The aortic valve has an indeterminant number of cusps. There is mild thickening of the aortic valve. Aortic valve regurgitation is not visualized. Aortic valve sclerosis is present, with no evidence of aortic valve stenosis. Aortic valve mean gradient measures 8.0 mmHg. Aortic valve peak gradient measures 12.8 mmHg. Aortic valve area, by VTI measures 3.67 cm. Pulmonic Valve: The pulmonic valve was normal in structure. Pulmonic valve regurgitation is not visualized. No evidence of pulmonic stenosis. Aorta: The aortic root and ascending aorta are structurally normal, with no evidence of dilitation. Pulmonary Artery: The pulmonary artery is of normal size. Venous: The inferior vena cava was not well visualized. IAS/Shunts: The interatrial septum was not well visualized.  LEFT VENTRICLE PLAX 2D LVIDd:         4.20 cm   Diastology LVIDs:         3.00 cm   LV e' medial:    9.14 cm/s LV PW:         1.10 cm   LV E/e' medial:  12.1 LV IVS:        1.20 cm   LV e' lateral:   15.40 cm/s LVOT diam:     2.10 cm   LV E/e' lateral: 7.2 LV SV:         118 LV SV Index:   50 LVOT Area:     3.46 cm  RIGHT VENTRICLE RV Basal diam:  2.80 cm RV S prime:     18.30 cm/s TAPSE (M-mode): 2.3 cm LEFT ATRIUM           Index        RIGHT ATRIUM           Index LA diam:      4.10 cm 1.75 cm/m   RA Area:     11.12 cm LA Vol (A4C): 67.6 ml 28.87 ml/m  RA Volume:   24.77 ml  10.58 ml/m  AORTIC VALVE AV Area (Vmax):    3.23 cm AV Area (Vmean):   3.09 cm AV Area (VTI):     3.67 cm AV Vmax:           179.00 cm/s AV Vmean:          131.000 cm/s AV VTI:  0.322 m AV Peak Grad:      12.8 mmHg AV Mean Grad:      8.0 mmHg LVOT Vmax:         167.00 cm/s LVOT Vmean:        117.000 cm/s LVOT VTI:          0.341 m LVOT/AV VTI ratio: 1.06  AORTA Ao Root diam: 3.20 cm MITRAL VALVE MV Area (PHT): 2.85 cm     SHUNTS MV Decel Time: 266 msec     Systemic VTI:  0.34 m MV E velocity: 111.00 cm/s  Systemic Diam:  2.10 cm MV A velocity: 123.00 cm/s MV E/A ratio:  0.90 Yvonne Kendallhristopher End MD Electronically signed by Yvonne Kendallhristopher End MD Signature Date/Time: 04/21/2022/10:29:08 AM    Final    CT Head Wo Contrast  Result Date: 04/20/2022 CLINICAL DATA:  Mental status change with unknown cause. Leg swelling and right upper quadrant pain. Recent drug abuse. EXAM: CT HEAD WITHOUT CONTRAST TECHNIQUE: Contiguous axial images were obtained from the base of the skull through the vertex without intravenous contrast. RADIATION DOSE REDUCTION: This exam was performed according to the departmental dose-optimization program which includes automated exposure control, adjustment of the mA and/or kV according to patient size and/or use of iterative reconstruction technique. COMPARISON:  None Available. FINDINGS: Brain: Encephalomalacia in the left more than right anterior and inferior frontal lobes and in the medial left temporal lobe, a posttraumatic pattern. No evidence of acute infarct, hemorrhage, hydrocephalus, or mass. Vascular: No hyperdense vessel or unexpected calcification. Skull: Normal. Negative for fracture or focal lesion. Sinuses/Orbits: No acute finding. IMPRESSION: 1. No acute finding. 2. 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 Electronically Signed   By: Tiburcio PeaJonathan  Watts M.D.   On: 04/20/2022 05:06   US Venous Img Lower Bilateral (DVT)  Result Date: 04/20/2022 CLINICAL DATA:  Peripheral swelling for 1 day EXAM: BILATERAL LOWER EXTREMITY VENOUS DOPPLER ULTRASOUND TECHNIQUE: Gray-scale sonography with graded compression, as well as color Doppler and duplex ultrasound were performed to evaluate the lower extremity deep venous systems from the level of the common femoral vein and including the common femoral, femoral, profunda femoral, popliteal and calf veins including the posterior tibial, peroneal and gastrocnemius veins when visible. The superficial great saphenous  vein was also interrogated. Spectral Doppler was utilized to evaluate flow at rest and with distal augmentation maneuvers in the common femoral, femoral and popliteal veins. COMPARISON:  None Available. FINDINGS: RIGHT LOWER EXTREMITY Common Femoral Vein: No evidence of thrombus. Normal compressibility, respiratory phasicity and response to augmentation. Saphenofemoral Junction: No evidence of thrombus. Normal compressibility and flow on color Doppler imaging. Profunda Femoral Vein: No evidence of thrombus. Normal compressibility and flow on color Doppler imaging. Femoral Vein: No evidence of thrombus. Normal compressibility, respiratory phasicity and response to augmentation. Popliteal Vein: No evidence of thrombus. Normal compressibility, respiratory phasicity and response to augmentation. Calf Veins: No evidence of thrombus. Normal compressibility and flow on color Doppler imaging. Superficial Great Saphenous Vein: No evidence of thrombus. Normal compressibility. Venous Reflux:  None. Other Findings:  None. LEFT LOWER EXTREMITY Common Femoral Vein: No evidence of thrombus. Normal compressibility, respiratory phasicity and response to augmentation. Saphenofemoral Junction: No evidence of thrombus. Normal compressibility and flow on color Doppler imaging. Profunda Femoral Vein: No evidence of thrombus. Normal compressibility and flow on color Doppler imaging. Femoral Vein: No evidence of thrombus. Normal compressibility, respiratory phasicity and response to augmentation. Popliteal Vein: No evidence of  thrombus. Normal compressibility, respiratory phasicity and response to augmentation. Calf Veins: No evidence of thrombus. Normal compressibility and flow on color Doppler imaging. Superficial Great Saphenous Vein: No evidence of thrombus. Normal compressibility. Venous Reflux:  None. Other Findings:  None. IMPRESSION: No evidence of deep venous thrombosis in either lower extremity. Electronically Signed   By: Alcide Clever M.D.   On: 04/20/2022 02:37   US ABDOMEN LIMITED RUQ (LIVER/GB)  Result Date: 04/20/2022 CLINICAL DATA:  History of cirrhosis EXAM: ULTRASOUND ABDOMEN LIMITED RIGHT UPPER QUADRANT COMPARISON:  None Available. FINDINGS: Gallbladder: Cholelithiasis is noted without wall thickening or pericholecystic fluid. Common bile duct: Diameter: 3 mm Liver: Increased echogenicity is noted consistent with the given clinical history of cirrhosis. No focal mass is noted. Portal vein is patent on color Doppler imaging with normal direction of blood flow towards the liver. Other: Mild ascites is noted in the left upper quadrant IMPRESSION: Changes of cirrhosis of the liver with mild ascites. Cholelithiasis without complicating factors. Electronically Signed   By: Alcide Clever M.D.   On: 04/20/2022 02:33    Microbiology: No results found for this or any previous visit.  Labs: CBC: Recent Labs  Lab 04/19/22 2327 04/20/22 0639 04/21/22 0620 04/22/22 0610  WBC 2.9* 2.0* 2.8* 2.4*  NEUTROABS 1.9 1.2* 1.8  --   HGB 10.1* 10.3* 9.7* 9.7*  HCT 29.2* 30.5* 27.9* 28.7*  MCV 102.5* 104.1* 103.7* 105.1*  PLT 49* 41* 54* 49*   Basic Metabolic Panel: Recent Labs  Lab 04/19/22 2327 04/20/22 0639 04/21/22 0620 04/22/22 0610  NA 138 139 141 139  K 2.9* 3.1* 3.4* 3.8  CL 107 107 111 109  CO2 25 26 26 25   GLUCOSE 142* 114* 110* 141*  BUN 7 6 6 7   CREATININE 0.59* 0.57* 0.66 0.73  CALCIUM 8.3* 8.3* 8.2* 8.2*  MG  --   --  1.8 1.8   Liver Function Tests: Recent Labs  Lab 04/19/22 2327 04/20/22 0639 04/21/22 0620  AST 81* 73* 54*  ALT 69* 65* 54*  ALKPHOS 94 94 87  BILITOT 5.5* 5.6* 4.5*  PROT 5.9* 5.7* 5.0*  ALBUMIN 2.9* 2.7* 2.3*   CBG: Recent Labs  Lab 04/20/22 0832 04/21/22 0900 04/22/22 0741  GLUCAP 86 105* 132*    Discharge time spent: greater than 30 minutes.  Signed: 04/23/22, MD [facility] 04/22/2022

## 2022-04-22 NOTE — TOC Progression Note (Addendum)
Transition of Care [facility]) - Progression Note    Patient Details  Name: Marco Spears MRN: [id number] Date of Birth: Jul 15, 1976  [facility] CM/SW Contact  Caryn Section, RN Phone Number: 04/22/2022, 2:54 PM  Clinical Narrative:   Patient reiterates that he does not have his prison ID, so he is unable to go to homeless shelter.  He states DMV told him he cannot get an ID because he does not have his prison ID.  He states he cannot get in touch with his friend at this point.  RNCM discussed patient keeping his ID and other important documents in a place he is able to easy get them or with someone he can contact easily to avoid these situations.  Patient states he will get his paperwork and be sure to have it where he can reach it in the future.  He plans to work with DSS and get disability, food stamps and ID. RNCM provided the phone number for community resources, probation department and shelters to assist patient.  Offered substance abuse rehabilitation, patient refuses rehabilitaiton at this time, states that he was falsely positive for meth and he "does not want anything to do with rehabilitation."  RNCM reached out to [facility], states they are unsure of being able to accommodate without ID, but patient's information was provided to clinic and they will look into taking him for his medical needs.  [facility] for [facility] provided to patient, with the explanation to complete application and follow up with clinic ASAP due to medical needs. Patient agrees with this plan, verbalizes understanding.  RNCM again explored the possibility of friends and family accommodating patient.  Patient states again that brother is an addict and parents are deceased.  He states being in prison so long, he does not have friends.  RNCM paged chaplain to provide encouragement and a listening ear.  Medication Management provided medications for patient, who was verbally gracious for  assistance.  Patient states he understands he is homeless and would like to be discharged.  He states he will continue to work with resources given in the community.He states he has a IT trainer to assist him with disability and food stamps.  He does not recall the name or phone number at this time.  He plans to see the worker after discharge.  Addendum 1519 DSS made aware of patient discharge.  They were unable to advise RNCM of caseworker at this time.    Expected Discharge Plan: Homeless Shelter Barriers to Discharge: Homeless with medical needs  Expected Discharge Plan and Services Expected Discharge Plan: Homeless Shelter In-house Referral: Artist Discharge Planning Services: CM Consult, Medication Assistance   Living arrangements for the past 2 months:  (homeless) Expected Discharge Date: 04/22/22                                     Social Determinants of Health (SDOH) Interventions    Readmission Risk Interventions     No data to display

## 2022-04-22 NOTE — Progress Notes (Signed)
   04/22/22 1500  Clinical Encounter Type  Visited With Patient  Visit Type Initial;Social support  Referral From Care management  Consult/Referral To Chaplain   Chaplain responded to Case Management request to provide social and spiritual support to patient before discharge.

## 2022-04-22 NOTE — Progress Notes (Signed)
Patient discharged to home, AVS reviewed. IV removed. Pharmacy delivered prescriptions. Provided wound care supplies. Per MD patient can removed unna boots in one week if he is unable to [facility]. Patient was assisted to the medical mall entrance and provided a taxi voucher

## 2022-04-24 ENCOUNTER — Other Ambulatory Visit: Payer: Self-pay | Admitting: Pharmacy Technician

## 2022-04-24 NOTE — Progress Notes (Signed)
Patient only signed DOH Attestation.  Would need to provide current year's household income if PAP medications were needed.  Kewana Sanon J. Laiklyn Pilkenton Patient Advocate Specialist [facility]  

## 2022-05-05 ENCOUNTER — Emergency Department: Payer: Self-pay

## 2022-05-05 ENCOUNTER — Other Ambulatory Visit: Payer: Self-pay

## 2022-05-05 ENCOUNTER — Inpatient Hospital Stay
Admission: EM | Admit: 2022-05-05 | Discharge: 2022-05-13 | DRG: 872 | Disposition: A | Discharge status: Home / Self Care | Payer: Self-pay | Attending: Internal Medicine | Admitting: Internal Medicine

## 2022-05-05 DIAGNOSIS — Z5902 Unsheltered homelessness: Secondary | ICD-10-CM

## 2022-05-05 DIAGNOSIS — F1510 Other stimulant abuse, uncomplicated: Secondary | ICD-10-CM | POA: Diagnosis present

## 2022-05-05 DIAGNOSIS — Z880 Allergy status to penicillin: Secondary | ICD-10-CM

## 2022-05-05 DIAGNOSIS — D631 Anemia in chronic kidney disease: Secondary | ICD-10-CM | POA: Diagnosis present

## 2022-05-05 DIAGNOSIS — M609 Myositis, unspecified: Secondary | ICD-10-CM | POA: Diagnosis present

## 2022-05-05 DIAGNOSIS — D6959 Other secondary thrombocytopenia: Secondary | ICD-10-CM | POA: Diagnosis present

## 2022-05-05 DIAGNOSIS — L03115 Cellulitis of right lower limb: Secondary | ICD-10-CM | POA: Diagnosis present

## 2022-05-05 DIAGNOSIS — E871 Hypo-osmolality and hyponatremia: Secondary | ICD-10-CM | POA: Diagnosis present

## 2022-05-05 DIAGNOSIS — D649 Anemia, unspecified: Secondary | ICD-10-CM | POA: Diagnosis present

## 2022-05-05 DIAGNOSIS — R627 Adult failure to thrive: Secondary | ICD-10-CM | POA: Diagnosis present

## 2022-05-05 DIAGNOSIS — K7682 Hepatic encephalopathy: Secondary | ICD-10-CM | POA: Diagnosis present

## 2022-05-05 DIAGNOSIS — E46 Unspecified protein-calorie malnutrition: Secondary | ICD-10-CM | POA: Diagnosis present

## 2022-05-05 DIAGNOSIS — M71161 Other infective bursitis, right knee: Secondary | ICD-10-CM | POA: Diagnosis present

## 2022-05-05 DIAGNOSIS — F1210 Cannabis abuse, uncomplicated: Secondary | ICD-10-CM | POA: Diagnosis present

## 2022-05-05 DIAGNOSIS — Z6828 Body mass index (BMI) 28.0-28.9, adult: Secondary | ICD-10-CM

## 2022-05-05 DIAGNOSIS — F1910 Other psychoactive substance abuse, uncomplicated: Secondary | ICD-10-CM

## 2022-05-05 DIAGNOSIS — A400 Sepsis due to streptococcus, group A: Principal | ICD-10-CM | POA: Diagnosis present

## 2022-05-05 DIAGNOSIS — E876 Hypokalemia: Secondary | ICD-10-CM | POA: Diagnosis present

## 2022-05-05 DIAGNOSIS — Q825 Congenital non-neoplastic nevus: Secondary | ICD-10-CM

## 2022-05-05 DIAGNOSIS — K7460 Unspecified cirrhosis of liver: Secondary | ICD-10-CM

## 2022-05-05 DIAGNOSIS — R7989 Other specified abnormal findings of blood chemistry: Secondary | ICD-10-CM | POA: Diagnosis present

## 2022-05-05 DIAGNOSIS — A419 Sepsis, unspecified organism: Secondary | ICD-10-CM | POA: Diagnosis present

## 2022-05-05 DIAGNOSIS — Z79899 Other long term (current) drug therapy: Secondary | ICD-10-CM

## 2022-05-05 DIAGNOSIS — K7040 Alcoholic hepatic failure without coma: Secondary | ICD-10-CM | POA: Diagnosis present

## 2022-05-05 DIAGNOSIS — K7031 Alcoholic cirrhosis of liver with ascites: Secondary | ICD-10-CM | POA: Diagnosis present

## 2022-05-05 DIAGNOSIS — F17210 Nicotine dependence, cigarettes, uncomplicated: Secondary | ICD-10-CM | POA: Diagnosis present

## 2022-05-05 DIAGNOSIS — L0390 Cellulitis, unspecified: Secondary | ICD-10-CM | POA: Diagnosis present

## 2022-05-05 DIAGNOSIS — Z8619 Personal history of other infectious and parasitic diseases: Secondary | ICD-10-CM

## 2022-05-05 LAB — URINE DRUG SCREEN, QUALITATIVE (ARMC ONLY)
Amphetamines, Ur Screen: POSITIVE — AB
Barbiturates, Ur Screen: NOT DETECTED
Benzodiazepine, Ur Scrn: NOT DETECTED
Cannabinoid 50 Ng, Ur ~~LOC~~: POSITIVE — AB
Cocaine Metabolite,Ur ~~LOC~~: NOT DETECTED
MDMA (Ecstasy)Ur Screen: NOT DETECTED
Methadone Scn, Ur: NOT DETECTED
Opiate, Ur Screen: NOT DETECTED
Phencyclidine (PCP) Ur S: NOT DETECTED
Tricyclic, Ur Screen: NOT DETECTED

## 2022-05-05 LAB — COMPREHENSIVE METABOLIC PANEL
ALT: 36 U/L (ref 0–44)
AST: 45 U/L — ABNORMAL HIGH (ref 15–41)
Albumin: 2.3 g/dL — ABNORMAL LOW (ref 3.5–5.0)
Alkaline Phosphatase: 72 U/L (ref 38–126)
Anion gap: 5 (ref 5–15)
BUN: 14 mg/dL (ref 6–20)
CO2: 27 mmol/L (ref 22–32)
Calcium: 7.6 mg/dL — ABNORMAL LOW (ref 8.9–10.3)
Chloride: 100 mmol/L (ref 98–111)
Creatinine, Ser: 0.66 mg/dL (ref 0.61–1.24)
GFR, Estimated: >60 mL/min (ref >60)
Glucose, Bld: 117 mg/dL — ABNORMAL HIGH (ref 70–99)
Potassium: 3.3 mmol/L — ABNORMAL LOW (ref 3.5–5.1)
Sodium: 132 mmol/L — ABNORMAL LOW (ref 135–145)
Total Bilirubin: 4.6 mg/dL — ABNORMAL HIGH (ref 0.3–1.2)
Total Protein: 5.3 g/dL — ABNORMAL LOW (ref 6.5–8.1)

## 2022-05-05 LAB — URINALYSIS, ROUTINE W REFLEX MICROSCOPIC
Glucose, UA: NEGATIVE mg/dL
Hgb urine dipstick: NEGATIVE
Ketones, ur: NEGATIVE mg/dL
Leukocytes,Ua: NEGATIVE
Nitrite: NEGATIVE
Protein, ur: NEGATIVE mg/dL
Specific Gravity, Urine: 1.015 (ref 1.005–1.030)
pH: 6 (ref 5.0–8.0)

## 2022-05-05 LAB — CBC WITH DIFFERENTIAL/PLATELET
Abs Immature Granulocytes: 0.03 10*3/uL (ref 0.00–0.07)
Basophils Absolute: 0 10*3/uL (ref 0.0–0.1)
Basophils Relative: 0 %
Eosinophils Absolute: 0.1 10*3/uL (ref 0.0–0.5)
Eosinophils Relative: 1 %
HCT: 31.5 % — ABNORMAL LOW (ref 39.0–52.0)
Hemoglobin: 10.8 g/dL — ABNORMAL LOW (ref 13.0–17.0)
Immature Granulocytes: 1 %
Lymphocytes Relative: 8 %
Lymphs Abs: 0.4 10*3/uL — ABNORMAL LOW (ref 0.7–4.0)
MCH: 35.4 pg — ABNORMAL HIGH (ref 26.0–34.0)
MCHC: 34.3 g/dL (ref 30.0–36.0)
MCV: 103.3 fL — ABNORMAL HIGH (ref 80.0–100.0)
Monocytes Absolute: 0.6 10*3/uL (ref 0.1–1.0)
Monocytes Relative: 11 %
Neutro Abs: 4.4 10*3/uL (ref 1.7–7.7)
Neutrophils Relative %: 79 %
Platelets: 51 10*3/uL — ABNORMAL LOW (ref 150–400)
RBC: 3.05 MIL/uL — ABNORMAL LOW (ref 4.22–5.81)
RDW: 17.6 % — ABNORMAL HIGH (ref 11.5–15.5)
WBC: 5.5 10*3/uL (ref 4.0–10.5)
nRBC: 0 % (ref 0.0–0.2)

## 2022-05-05 LAB — FIBRINOGEN: Fibrinogen: 238 mg/dL (ref 210–475)

## 2022-05-05 LAB — CK: Total CK: 71 U/L (ref 49–397)

## 2022-05-05 LAB — APTT: aPTT: 38 seconds — ABNORMAL HIGH (ref 24–36)

## 2022-05-05 LAB — PROTIME-INR
INR: 1.5 — ABNORMAL HIGH (ref 0.8–1.2)
Prothrombin Time: 17.7 seconds — ABNORMAL HIGH (ref 11.4–15.2)

## 2022-05-05 LAB — AMMONIA: Ammonia: 34 umol/L (ref 9–35)

## 2022-05-05 LAB — LIPASE, BLOOD: Lipase: 31 U/L (ref 11–51)

## 2022-05-05 MED ORDER — VANCOMYCIN HCL 1500 MG/300ML IV SOLN
1500.0000 mg | Freq: Three times a day (TID) | INTRAVENOUS | Status: DC
  Administered 2022-05-06: 1500 mg via INTRAVENOUS
  Filled 2022-05-05 (×2): qty 300

## 2022-05-05 MED ORDER — ACETAMINOPHEN 325 MG PO TABS
650.0000 mg | ORAL_TABLET | Freq: Four times a day (QID) | ORAL | Status: DC | PRN
  Administered 2022-05-12: 650 mg via ORAL
  Filled 2022-05-05 (×2): qty 2

## 2022-05-05 MED ORDER — ONDANSETRON HCL 4 MG/2ML IJ SOLN
4.0000 mg | Freq: Four times a day (QID) | INTRAMUSCULAR | Status: DC | PRN
  Administered 2022-05-08 (×2): 4 mg via INTRAVENOUS
  Filled 2022-05-05 (×2): qty 2

## 2022-05-05 MED ORDER — ACETAMINOPHEN 650 MG RE SUPP: 650.0000 mg | Freq: Four times a day (QID) | RECTAL | Status: DC | PRN

## 2022-05-05 MED ORDER — LACTATED RINGERS IV SOLN: INTRAVENOUS | Status: DC

## 2022-05-05 MED ORDER — BISACODYL 5 MG PO TBEC: 5.0000 mg | DELAYED_RELEASE_TABLET | Freq: Every day | ORAL | Status: DC | PRN

## 2022-05-05 MED ORDER — IOHEXOL 300 MG/ML  SOLN
125.0000 mL | Freq: Once | INTRAMUSCULAR | Status: AC | PRN
  Administered 2022-05-05: 125 mL via INTRAVENOUS

## 2022-05-05 MED ORDER — OXYCODONE HCL 5 MG PO TABS
5.0000 mg | ORAL_TABLET | ORAL | Status: DC | PRN
  Administered 2022-05-06 (×2): 5 mg via ORAL
  Filled 2022-05-05 (×3): qty 1

## 2022-05-05 MED ORDER — LACTULOSE 10 GM/15ML PO SOLN
30.0000 g | Freq: Two times a day (BID) | ORAL | Status: DC
  Administered 2022-05-06 – 2022-05-12 (×8): 30 g via ORAL
  Filled 2022-05-05 (×16): qty 60

## 2022-05-05 MED ORDER — LACTATED RINGERS IV BOLUS
2000.0000 mL | Freq: Once | INTRAVENOUS | Status: AC
  Administered 2022-05-05: 2000 mL via INTRAVENOUS

## 2022-05-05 MED ORDER — MORPHINE SULFATE (PF) 2 MG/ML IV SOLN
2.0000 mg | INTRAVENOUS | Status: DC | PRN
  Administered 2022-05-06: 2 mg via INTRAVENOUS
  Filled 2022-05-05 (×2): qty 1

## 2022-05-05 MED ORDER — DOCUSATE SODIUM 100 MG PO CAPS
100.0000 mg | ORAL_CAPSULE | Freq: Two times a day (BID) | ORAL | Status: DC
  Administered 2022-05-05 – 2022-05-13 (×12): 100 mg via ORAL
  Filled 2022-05-05 (×16): qty 1

## 2022-05-05 MED ORDER — KETOROLAC TROMETHAMINE 15 MG/ML IJ SOLN
15.0000 mg | Freq: Once | INTRAMUSCULAR | Status: AC
  Administered 2022-05-05: 15 mg via INTRAVENOUS
  Filled 2022-05-05: qty 1

## 2022-05-05 MED ORDER — CLINDAMYCIN HCL 150 MG PO CAPS
600.0000 mg | ORAL_CAPSULE | Freq: Three times a day (TID) | ORAL | Status: DC
  Administered 2022-05-06 (×2): 600 mg via ORAL
  Filled 2022-05-05 (×2): qty 4

## 2022-05-05 MED ORDER — VANCOMYCIN HCL 2000 MG/400ML IV SOLN
2000.0000 mg | Freq: Once | INTRAVENOUS | Status: AC
  Administered 2022-05-05: 2000 mg via INTRAVENOUS
  Filled 2022-05-05: qty 400

## 2022-05-05 MED ORDER — CEFTRIAXONE SODIUM 2 G IJ SOLR
2.0000 g | INTRAMUSCULAR | Status: DC
  Administered 2022-05-06 – 2022-05-07 (×2): 2 g via INTRAVENOUS
  Filled 2022-05-05 (×2): qty 20

## 2022-05-05 MED ORDER — SPIRONOLACTONE 25 MG PO TABS
25.0000 mg | ORAL_TABLET | Freq: Two times a day (BID) | ORAL | Status: DC
Start: 2022-05-05 — End: 2022-05-08
  Administered 2022-05-05 – 2022-05-08 (×6): 25 mg via ORAL
  Filled 2022-05-05 (×6): qty 1

## 2022-05-05 MED ORDER — POLYETHYLENE GLYCOL 3350 17 G PO PACK: 17.0000 g | PACK | Freq: Every day | ORAL | Status: DC | PRN

## 2022-05-05 MED ORDER — ONDANSETRON HCL 4 MG PO TABS: 4.0000 mg | ORAL_TABLET | Freq: Four times a day (QID) | ORAL | Status: DC | PRN

## 2022-05-05 MED ORDER — SODIUM CHLORIDE 0.9 % IV SOLN
2.0000 g | Freq: Once | INTRAVENOUS | Status: AC
  Administered 2022-05-05: 2 g via INTRAVENOUS
  Filled 2022-05-05: qty 20

## 2022-05-05 NOTE — ED Triage Notes (Signed)
Pt to er room number 14 via ems, pt c/o swelling and redness to his R leg, pt has rash on his R leg, pt has swelling bilaterally, ems has placed 2 18 gauge iv and have given aprox of ns.  Pt states that he was working in the yard yesterday and noticed the rash this am.

## 2022-05-05 NOTE — ED Notes (Signed)
With CT 

## 2022-05-05 NOTE — Progress Notes (Signed)
Pharmacy Antibiotic Note  Marco Spears is a 46 y.o. male admitted on 05/05/2022 with cellulitis.  Pharmacy has been consulted for Vancomycin dosing.  Plan: Pt given initial dose of Vancomycin 2 gm x 1. Vancomycin 1500 mg IV Q 8 hrs. Goal AUC 400-550. Expected AUC: 427.5 SCr used: 0.66  Pharmacy will continue to follow and will adjust abx dosing whenever warranted.   Height: 6\' 3"  (190.5 cm) Weight: 104.3 kg (230 lb) IBW/kg (Calculated) : 84.5  Temp (24hrs), Avg:98.4 F (36.9 C), Min:98.3 F (36.8 C), Max:98.4 F (36.9 C)  Recent Labs  Lab 05/05/22 1632  WBC 5.5  CREATININE 0.66    Estimated Creatinine Clearance: 147.6 mL/min (by C-G formula based on SCr of 0.66 mg/dL).    Allergies  Allergen Reactions   Amoxicillin Rash    States it makes his skin turn red    Antimicrobials this admission: 7/11 Ceftriaxone >> 7/11 Vancomycin >> 7/12 Clindamycin >> x 5 days  Microbiology results: 7/11 BCx: Pending 7/11 UCx: Pending   Thank you for allowing pharmacy to be a part of this patient's care.  9/11, PharmD, [facility] 05/06/2022 3:12 AM

## 2022-05-05 NOTE — H&P (Signed)
History and Physical    Marco Spears XNT:[id number] DOB: 09-Oct-1976 DOA: 05/05/2022  PCP: Pcp, No    Patient coming from:  Home    Chief Complaint:  Cellulitis.   HPI:  Marco Spears is a 46 y.o. male seen in ed with complaints of  rt leg pain and swelling that started after a bit he thinks , although says it is not snakebite as he would have remembered that. The rt leg is red and inflamed from groin down  with lymphadenopathy on ct.pt is alert/ oriented and states it just happened today. No other complaints of fevers or chills.  Patient does not report any headaches blurred vision chest pain shortness of breath palpitations abdominal pain nausea vomiting diarrhea constipation.   Pt has past medical history of allergy to amoxicillin, history of alcoholic cirrhosis of the liver, homelessness.  ED Course:   Vitals:   05/05/22 2100 05/05/22 2309 05/05/22 2311 05/06/22 0000  BP: 109/66 113/62  115/63  Pulse: 89 93 93 92  Resp: 17 20  19   Temp:  99.4 F (37.4 C)    TempSrc:  Oral    SpO2: 98% 99% 99% 96%  Weight:      Height:      In ed pt meets sepsis criteria and lactic resulted alter.  Pt started on vancomycin/ rocephin and clindamycin.  Labs shows: Hypokalemia with a potassium of 3.3, AST of 45, ALT of 36, ammonia of 34, total bili of 4.6, CPK of 71, lactic acid of 2.5. CBC shows normal white count of 5.5 hemoglobin of 10.5 MCV 103.3 and platelet counts of 51,000. In the emergency room patient got empiric broad-spectrum antibiotics in addition to CT imaging which showed the cellulitis.   Review of Systems:  Review of Systems  Cardiovascular:  Positive for leg swelling.  Musculoskeletal:  Positive for joint pain and myalgias.  All other systems reviewed and are negative.     Past Medical History:  Diagnosis Date   Cirrhosis of liver (HCC)    Heart murmur     History reviewed. No pertinent surgical history.   reports that he has been smoking cigarettes.  He has never used smokeless tobacco. He reports current drug use. Drug: Marijuana. He reports that he does not drink alcohol.  Allergies  Allergen Reactions   Amoxicillin Rash    States it makes his skin turn red    History reviewed. No pertinent family history.  Prior to Admission medications   Medication Sig Start Date End Date Taking? Authorizing Provider  furosemide (LASIX) 40 MG tablet Take 1 tablet (40 mg total) by mouth daily. 04/23/22   04/25/22, MD  lactulose (CHRONULAC) 10 GM/15ML solution Take 45 mLs (30 g total) by mouth 2 (two) times daily. 04/22/22   04/24/22, MD  magic mouthwash (nystatin, hydrocortisone, diphenhydrAMINE) suspension Take 5 mLs by mouth 3 (three) times daily as needed for mouth pain. 04/22/22   04/24/22, MD  spironolactone (ALDACTONE) 100 MG tablet Take 1 tablet (100 mg total) by mouth daily. 04/23/22   04/25/22, MD    Physical Exam: Vitals:   05/05/22 2100 05/05/22 2309 05/05/22 2311 05/06/22 0000  BP: 109/66 113/62  115/63  Pulse: 89 93 93 92  Resp: 17 20  19   Temp:  99.4 F (37.4 C)    TempSrc:  Oral    SpO2: 98% 99% 99% 96%  Weight:      Height:       Physical  Exam Vitals and nursing note reviewed.  Constitutional:      General: He is not in acute distress.    Appearance: Normal appearance. He is not ill-appearing, toxic-appearing or diaphoretic.  HENT:     Head: Normocephalic and atraumatic.     Right Ear: Hearing and external ear normal.     Left Ear: Hearing and external ear normal.     Nose: Nose normal. No nasal deformity.     Mouth/Throat:     Lips: Pink.     Mouth: Mucous membranes are moist.     Tongue: No lesions.     Pharynx: Oropharynx is clear.  Eyes:     Extraocular Movements: Extraocular movements intact.     Pupils: Pupils are equal, round, and reactive to light.  Cardiovascular:     Rate and Rhythm: Normal rate and regular rhythm.     Pulses: Normal pulses.     Heart sounds: Normal heart sounds.   Pulmonary:     Effort: Pulmonary effort is normal.     Breath sounds: Normal breath sounds.  Abdominal:     General: Bowel sounds are normal. There is no distension.     Palpations: Abdomen is soft. There is no mass.     Tenderness: There is no abdominal tenderness. There is no guarding.     Hernia: No hernia is present.  Musculoskeletal:     Right lower leg: No edema.     Left lower leg: No edema.  Skin:    General: Skin is warm.  Neurological:     General: No focal deficit present.     Mental Status: He is alert and oriented to person, place, and time.     Cranial Nerves: Cranial nerves 2-12 are intact.     Motor: Motor function is intact.  Psychiatric:        Attention and Perception: Attention normal.        Mood and Affect: Mood normal.        Speech: Speech normal.        Behavior: Behavior normal. Behavior is cooperative.        Cognition and Memory: Cognition normal.      Labs on Admission: I have personally reviewed following labs and imaging studies BMET Recent Labs  Lab 05/05/22 1632  NA 132*  K 3.3*  CL 100  CO2 27  BUN 14  CREATININE 0.66  GLUCOSE 117*   Electrolytes Recent Labs  Lab 05/05/22 1632  CALCIUM 7.6*   Sepsis Markers Recent Labs  Lab 05/05/22 2326  LATICACIDVEN 2.5*   ABG No results for input(s): "PHART", "PCO2ART", "PO2ART" in the last 168 hours. Liver Enzymes Recent Labs  Lab 05/05/22 1632  AST 45*  ALT 36  ALKPHOS 72  BILITOT 4.6*  ALBUMIN 2.3*   Cardiac Enzymes No results for input(s): "TROPONINI", "PROBNP" in the last 168 hours. No results found for: "DDIMER" Coag's Recent Labs  Lab 05/05/22 1632  APTT 38*  INR 1.5*    No results found for this or any previous visit (from the past 240 hour(s)).   Current Facility-Administered Medications:    acetaminophen (TYLENOL) tablet 650 mg, 650 mg, Oral, Q6H PRN **OR** acetaminophen (TYLENOL) suppository 650 mg, 650 mg, Rectal, Q6H PRN, Gertha CalkinPatel, Katara Griner V, MD   bisacodyl  (DULCOLAX) EC tablet 5 mg, 5 mg, Oral, Daily PRN, Gertha CalkinPatel, Charlotta Lapaglia V, MD   cefTRIAXone (ROCEPHIN) 2 g in sodium chloride 0.9 % 100 mL IVPB, 2 g, Intravenous, Q24H, Khale Nigh,  Eliezer Mccoy, MD   clindamycin (CLEOCIN) capsule 600 mg, 600 mg, Oral, Q8H, Giana Castner V, MD   docusate sodium (COLACE) capsule 100 mg, 100 mg, Oral, BID, Irena Cords V, MD, 100 mg at 05/05/22 2314   lactated ringers infusion, , Intravenous, Continuous, Gertha Calkin, MD, Last Rate: 75 mL/hr at 05/05/22 2310, New Bag at 05/05/22 2310   lactulose (CHRONULAC) 10 GM/15ML solution 30 g, 30 g, Oral, BID, Allena Katz, Aubery Douthat V, MD   morphine (PF) 2 MG/ML injection 2 mg, 2 mg, Intravenous, Q2H PRN, Allena Katz, Eliezer Mccoy, MD   ondansetron (ZOFRAN) tablet 4 mg, 4 mg, Oral, Q6H PRN **OR** ondansetron (ZOFRAN) injection 4 mg, 4 mg, Intravenous, Q6H PRN, Irena Cords V, MD   oxyCODONE (Oxy IR/ROXICODONE) immediate release tablet 5 mg, 5 mg, Oral, Q4H PRN, Allena Katz, Kimo Bancroft V, MD   polyethylene glycol (MIRALAX / GLYCOLAX) packet 17 g, 17 g, Oral, Daily PRN, Gertha Calkin, MD   spironolactone (ALDACTONE) tablet 25 mg, 25 mg, Oral, BID, Irena Cords V, MD, 25 mg at 05/05/22 2314   vancomycin (VANCOREADY) IVPB 1500 mg/300 mL, 1,500 mg, Intravenous, Q8H, Belue, Lendon Collar, RPH, Last Rate: 150 mL/hr at 05/06/22 0118, 1,500 mg at 05/06/22 0118  Current Outpatient Medications:    B Complex-C (B-COMPLEX WITH VITAMIN C) tablet, Take 1 tablet by mouth daily., Disp: , Rfl:    ferrous sulfate 325 (65 FE) MG tablet, Take 325 mg by mouth daily with breakfast., Disp: , Rfl:    folic acid (FOLVITE) 1 MG tablet, Take 1 mg by mouth daily., Disp: , Rfl:    furosemide (LASIX) 40 MG tablet, Take 1 tablet (40 mg total) by mouth daily., Disp: 30 tablet, Rfl: 0   lactulose (CHRONULAC) 10 GM/15ML solution, Take 45 mLs (30 g total) by mouth 2 (two) times daily., Disp: 3000 mL, Rfl: 0   Multiple Vitamin (MULTIVITAMIN WITH MINERALS) TABS tablet, Take 1 tablet by mouth daily., Disp: , Rfl:     spironolactone (ALDACTONE) 100 MG tablet, Take 1 tablet (100 mg total) by mouth daily., Disp: 30 tablet, Rfl: 0   magic mouthwash (nystatin, hydrocortisone, diphenhydrAMINE) suspension, Take 5 mLs by mouth 3 (three) times daily as needed for mouth pain. (Patient not taking: Reported on 05/06/2022), Disp: 100 mL, Rfl: 0  COVID-19 Labs No results for input(s): "DDIMER", "FERRITIN", "LDH", "CRP" in the last 72 hours. No results found for: "SARSCOV2NAA"  Radiological Exams on Admission: CT FEMUR RIGHT W CONTRAST  Result Date: 05/05/2022 CLINICAL DATA:  Redness/swelling to right leg EXAM: CT OF THE LOWER RIGHT EXTREMITY WITH CONTRAST CT OF THE RIGHT TIBIA AND FIBULA WITH CONTRAST CT OF THE RIGHT FOOT WITH CONTRAST TECHNIQUE: Multidetector CT imaging of the lower right extremity was performed according to the standard protocol following intravenous contrast administration. RADIATION DOSE REDUCTION: This exam was performed according to the departmental dose-optimization program which includes automated exposure control, adjustment of the mA and/or kV according to patient size and/or use of iterative reconstruction technique. CONTRAST:  OMNIPAQUE IOHEXOL 300 MG/ML  SOLN COMPARISON:  Right lower extremity venous Doppler dated 05/05/2022 FINDINGS: CT of the right lower extremity performed, extending from the level of the lower pelvis through the right foot. Visualized pelvis is notable for moderate pelvic ascites and right inguinal nodes measuring up to 19 mm short axis (series 8/image 99), likely reactive. No evidence of DVT. Visualized osseous structures are within normal limits. No fracture is seen. No cortical destruction to suggest osteomyelitis. No drainable fluid  collection/abscess. Mild subcutaneous edema along the anterior upper thigh. Additional subcutaneous edema along the calf, more prominent laterally. Mild subcutaneous edema with skin thickening along the dorsal foot. These findings correspond to  the clinical appearance of cellulitis. No intramuscular abnormality. IMPRESSION: Cellulitis along the right lower extremity, as above. No drainable fluid collection/abscess. No evidence of osteomyelitis. Prominent right inguinal nodes, likely reactive. Moderate pelvic ascites, incompletely visualized. Electronically Signed   By: Charline Bills M.D.   On: 05/05/2022 21:15   CT TIBIA FIBULA RIGHT W CONTRAST  Result Date: 05/05/2022 CLINICAL DATA:  Redness/swelling to right leg EXAM: CT OF THE LOWER RIGHT EXTREMITY WITH CONTRAST CT OF THE RIGHT TIBIA AND FIBULA WITH CONTRAST CT OF THE RIGHT FOOT WITH CONTRAST TECHNIQUE: Multidetector CT imaging of the lower right extremity was performed according to the standard protocol following intravenous contrast administration. RADIATION DOSE REDUCTION: This exam was performed according to the departmental dose-optimization program which includes automated exposure control, adjustment of the mA and/or kV according to patient size and/or use of iterative reconstruction technique. CONTRAST:  OMNIPAQUE IOHEXOL 300 MG/ML  SOLN COMPARISON:  Right lower extremity venous Doppler dated 05/05/2022 FINDINGS: CT of the right lower extremity performed, extending from the level of the lower pelvis through the right foot. Visualized pelvis is notable for moderate pelvic ascites and right inguinal nodes measuring up to 19 mm short axis (series 8/image 99), likely reactive. No evidence of DVT. Visualized osseous structures are within normal limits. No fracture is seen. No cortical destruction to suggest osteomyelitis. No drainable fluid collection/abscess. Mild subcutaneous edema along the anterior upper thigh. Additional subcutaneous edema along the calf, more prominent laterally. Mild subcutaneous edema with skin thickening along the dorsal foot. These findings correspond to the clinical appearance of cellulitis. No intramuscular abnormality. IMPRESSION: Cellulitis along the right  lower extremity, as above. No drainable fluid collection/abscess. No evidence of osteomyelitis. Prominent right inguinal nodes, likely reactive. Moderate pelvic ascites, incompletely visualized. Electronically Signed   By: Charline Bills M.D.   On: 05/05/2022 21:15   CT FOOT RIGHT W CONTRAST  Result Date: 05/05/2022 CLINICAL DATA:  Redness/swelling to right leg EXAM: CT OF THE LOWER RIGHT EXTREMITY WITH CONTRAST CT OF THE RIGHT TIBIA AND FIBULA WITH CONTRAST CT OF THE RIGHT FOOT WITH CONTRAST TECHNIQUE: Multidetector CT imaging of the lower right extremity was performed according to the standard protocol following intravenous contrast administration. RADIATION DOSE REDUCTION: This exam was performed according to the departmental dose-optimization program which includes automated exposure control, adjustment of the mA and/or kV according to patient size and/or use of iterative reconstruction technique. CONTRAST:  OMNIPAQUE IOHEXOL 300 MG/ML  SOLN COMPARISON:  Right lower extremity venous Doppler dated 05/05/2022 FINDINGS: CT of the right lower extremity performed, extending from the level of the lower pelvis through the right foot. Visualized pelvis is notable for moderate pelvic ascites and right inguinal nodes measuring up to 19 mm short axis (series 8/image 99), likely reactive. No evidence of DVT. Visualized osseous structures are within normal limits. No fracture is seen. No cortical destruction to suggest osteomyelitis. No drainable fluid collection/abscess. Mild subcutaneous edema along the anterior upper thigh. Additional subcutaneous edema along the calf, more prominent laterally. Mild subcutaneous edema with skin thickening along the dorsal foot. These findings correspond to the clinical appearance of cellulitis. No intramuscular abnormality. IMPRESSION: Cellulitis along the right lower extremity, as above. No drainable fluid collection/abscess. No evidence of osteomyelitis. Prominent right  inguinal nodes, likely reactive. Moderate pelvic ascites,  incompletely visualized. Electronically Signed   By: Charline Bills M.D.   On: 05/05/2022 21:15   US Venous Img Lower Unilateral Right  Result Date: 05/05/2022 CLINICAL DATA:  RLE pain, swelling, erythema EXAM: RIGHT LOWER EXTREMITY VENOUS DOPPLER ULTRASOUND TECHNIQUE: Gray-scale sonography with compression, as well as color and duplex ultrasound, were performed to evaluate the deep venous system(s) from the level of the common femoral vein through the popliteal and proximal calf veins. COMPARISON:  None Available. FINDINGS: VENOUS Normal compressibility of the common femoral, superficial femoral, and popliteal veins, as well as the visualized calf veins. Visualized portions of profunda femoral vein and great saphenous vein unremarkable. No filling defects to suggest DVT on grayscale or color Doppler imaging. Doppler waveforms show normal direction of venous flow, normal respiratory plasticity and response to augmentation. Limited views of the contralateral common femoral vein are unremarkable. Other: Incidental lymph node identified in the right groin, which measures 4 x 1.4 cm. This node has a fatty hilum. IMPRESSION: No evidence of DVT in the right lower extremity. Electronically Signed   By: Feliberto Harts M.D.   On: 05/05/2022 18:12    EKG: Independently reviewed.  EKG shows sinus rhythm 97, left atrial enlargement, upright axis.   Assessment and Plan: * Cellulitis Patient presenting with extensive right leg cellulitis, due to, severity and extent patient started on triple antibiotic regimen with p.o. clindamycin. We will follow cultures and sensitivity. As needed Tylenol for pain medications.   Sepsis [facility]) Patient meets sepsis criteria with heart rate respirations and elevated lactic acid. We will continue with maintenance IV fluid regimen.   Anemia    Latest Ref Rng & Units 05/05/2022    4:32 PM 04/22/2022    6:10 AM  04/21/2022    6:20 AM  CBC  WBC 4.0 - 10.5 K/uL 5.5  2.4  2.8   Hemoglobin 13.0 - 17.0 g/dL 02.5  9.7  9.7   Hematocrit 39.0 - 52.0 % 31.5  28.7  27.9   Platelets 150 - 400 K/uL 51  49  54    Suspect anemia of chronic disease, alcoholism, possible iron deficiency and chronic blood loss from gastritis and upper GI issues. We will follow CBC and type and screen and transfuse as indicated. IV PPI therapy.   Alcoholic cirrhosis of liver with ascites (HCC) Aspiration, seizure precautions, ethanol level, CIWA precautions.  Hypokalemia Replace and follow levels.   DVT prophylaxis:  SCDs  Code Status:  Full code  Family Communication:  None  Disposition Plan:  Home  Consults called:  None  Admission status: Inpatient      Gertha Calkin MD [facility]  6 PM- 2 AM. Please contact me via secure Chat 6 PM-2 AM. [phone] ( Pager ) To contact the [facility] Attending or Consulting provider 7A - 7P or covering provider during after hours 7P -7A, for this patient.   Check the care team in [facility] and look for a) attending/consulting [facility] provider listed and b) the [facility] team listed Log into [email or web address] and use [facility]'s universal password to access. If you do not have the password, please contact the hospital operator. Locate the [facility] provider you are looking for under [facility] and page to a number that you can be directly reached. If you still have difficulty reaching the provider, please page the [facility] (Director on Call) for the Hospitalists listed on amion for assistance. [email or web address] 05/06/2022, 2:45 AM

## 2022-05-05 NOTE — ED Provider Notes (Signed)
[facility] Provider Note    Event Date/Time   First MD Initiated Contact with Patient 05/05/22 1541     (approximate)   History   Chief Complaint: Rash   HPI  Marco Spears is a 46 y.o. male with a past history of alcoholic cirrhosis, pancytopenia who comes ED complaining of pain, swelling, redness of the right leg that started this morning.  Constant, worsening.  Denies trauma.  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  He is homeless and sleeps outside.  Denies chest pain or shortness of breath, no dizziness or syncope.  Denies any recent tick bites.  Denies animal bites       Physical Exam   Triage Vital Signs: ED Triage Vitals  Enc Vitals Group     BP 05/05/22 1542 124/72     Pulse Rate 05/05/22 1542 95     Resp 05/05/22 1542 18     Temp 05/05/22 1542 98.4 F (36.9 C)     Temp Source 05/05/22 1542 Oral     SpO2 05/05/22 1542 98 %     Weight 05/05/22 1541 230 lb (104.3 kg)     Height 05/05/22 1541 6\' 3"  (1.905 m)     Head Circumference --      Peak Flow --      Pain Score 05/05/22 1541 10     Pain Loc --      Pain Edu? --      Excl. in GC? --     Most recent vital signs: Vitals:   05/05/22 1750 05/05/22 1800  BP:  112/70  Pulse:  88  Resp: 19   Temp:    SpO2:  99%    General: Awake, no distress.  CV:  Good peripheral perfusion.  Normal distal pulses.  No murmurs Resp:  Normal effort.  Clear to auscultation bilaterally Abd:  No distention.  Soft nontender Other:  Dry mucous membranes.  There is 2+ pitting edema bilateral lower extremities, right greater than left.  Calf circumference is greater on the right.  Left leg shows scattered dry skin abrasions, excoriations, possibly scabies infestation.  Right leg shows an area of what looks like confluent petechia on the right thigh, irregular border.  Very tender and warm to the touch.  No crepitus.  No fluctuance or drainage.  It does blanch.  No  bullae.  The right lower leg has scattered dry skin ulcerations.  He does have some scattered erythema on the soles of the feet.  The right palm has a few callused wounds which are nontender.    ED Results / Procedures / Treatments   Labs (all labs ordered are listed, but only abnormal results are displayed) Labs Reviewed  COMPREHENSIVE METABOLIC PANEL - Abnormal; Notable for the following components:      Result Value   Sodium 132 (*)    Potassium 3.3 (*)    Glucose, Bld 117 (*)    Calcium 7.6 (*)    Total Protein 5.3 (*)    Albumin 2.3 (*)    AST 45 (*)    Total Bilirubin 4.6 (*)    All other components within normal limits  URINALYSIS, ROUTINE W REFLEX MICROSCOPIC - Abnormal; Notable for the following components:   Color, Urine AMBER (*)    APPearance CLEAR (*)    Bilirubin Urine SMALL (*)    All other components within normal limits  URINE DRUG SCREEN, QUALITATIVE (  [facility] ONLY) - Abnormal; Notable for the following components:   Amphetamines, Ur Screen POSITIVE (*)    Cannabinoid 50 Ng, Ur Laura POSITIVE (*)    All other components within normal limits  CBC WITH DIFFERENTIAL/PLATELET - Abnormal; Notable for the following components:   RBC 3.05 (*)    Hemoglobin 10.8 (*)    HCT 31.5 (*)    MCV 103.3 (*)    MCH 35.4 (*)    RDW 17.6 (*)    Platelets 51 (*)    Lymphs Abs 0.4 (*)    All other components within normal limits  PROTIME-INR - Abnormal; Notable for the following components:   Prothrombin Time 17.7 (*)    INR 1.5 (*)    All other components within normal limits  APTT - Abnormal; Notable for the following components:   aPTT 38 (*)    All other components within normal limits  CULTURE, BLOOD (ROUTINE X 2)  CULTURE, BLOOD (ROUTINE X 2)  URINE CULTURE  LIPASE, BLOOD  CK     EKG Interpreted by me Sinus rhythm rate of 97.  Normal axis, normal intervals.  Poor R wave progression.  Normal ST segments and T waves.  No ischemic changes.   RADIOLOGY Ultrasound  right lower extremity interpreted by me, negative for occlusive DVT.  Radiology report reviewed.   PROCEDURES:  Procedures   MEDICATIONS ORDERED IN ED: Medications  lactated ringers bolus 2,000 mL (0 mLs Intravenous Stopped 05/05/22 1817)  cefTRIAXone (ROCEPHIN) 2 g in sodium chloride 0.9 % 100 mL IVPB (0 g Intravenous Stopped 05/05/22 1715)  vancomycin (VANCOREADY) IVPB 2000 mg/400 mL (2,000 mg Intravenous New Bag/Given 05/05/22 1719)  ketorolac (TORADOL) 15 MG/ML injection 15 mg (15 mg Intravenous Given 05/05/22 1619)     IMPRESSION / MDM / ASSESSMENT AND PLAN / ED COURSE  I reviewed the triage vital signs and the nursing notes.                              Differential diagnosis includes, but is not limited to, right leg cellulitis, DVT, tickborne illness, decompensated liver failure, AKI, electrolyte abnormality, dehydration, rhabdomyolysis  Patient's presentation is most consistent with acute presentation with potential threat to life or bodily function.  Patient presents with right leg pain and swelling above chronic baseline.  Labs consistent with chronic poor health including anemia, thrombocytopenia, liver dysfunction.  UDS positive for amphetamines and cannabinoids which patient admits to.  Urinalysis negative, CK normal, creatinine normal.  Ultrasound negative for DVT.  He does have an inguinal lymph node, and I think his presentation is most consistent with cellulitis.  Will start on ceftriaxone and vancomycin and admit for further management.  Case discussed with the hospitalist.  He is not septic.       FINAL CLINICAL IMPRESSION(S) / ED DIAGNOSES   Final diagnoses:  Cellulitis of right leg  Cirrhosis of liver without ascites, unspecified hepatic cirrhosis type (HCC)  Polysubstance abuse (HCC)     Rx / DC Orders   ED Discharge Orders     None        Note:  This document was prepared using Dragon voice recognition software and may include unintentional  dictation errors.   Sharman Cheek, MD 05/05/22 1925

## 2022-05-05 NOTE — ED Notes (Signed)
Pt in bed in bed, pt has blanket pulled up, resps even and unlabored, pt appears to be sleeping

## 2022-05-06 ENCOUNTER — Ambulatory Visit: Payer: Self-pay | Admitting: Gastroenterology

## 2022-05-06 ENCOUNTER — Inpatient Hospital Stay: Payer: Self-pay

## 2022-05-06 DIAGNOSIS — A419 Sepsis, unspecified organism: Secondary | ICD-10-CM

## 2022-05-06 DIAGNOSIS — L03115 Cellulitis of right lower limb: Secondary | ICD-10-CM

## 2022-05-06 DIAGNOSIS — K7031 Alcoholic cirrhosis of liver with ascites: Secondary | ICD-10-CM

## 2022-05-06 DIAGNOSIS — K7460 Unspecified cirrhosis of liver: Secondary | ICD-10-CM

## 2022-05-06 DIAGNOSIS — F1910 Other psychoactive substance abuse, uncomplicated: Secondary | ICD-10-CM

## 2022-05-06 DIAGNOSIS — E876 Hypokalemia: Secondary | ICD-10-CM

## 2022-05-06 LAB — SYNOVIAL CELL COUNT + DIFF, W/ CRYSTALS
Crystals, Fluid: NONE SEEN
Eosinophils-Synovial: 12 % — ABNORMAL HIGH (ref 0–1)
Lymphocytes-Synovial Fld: 18 % (ref 0–20)
Monocyte-Macrophage-Synovial Fluid: 9 % — ABNORMAL LOW (ref 50–90)
Neutrophil, Synovial: 60 % — ABNORMAL HIGH (ref 0–25)
Other Cells-SYN: 1
WBC, Synovial: 114 /mm3 (ref 0–200)

## 2022-05-06 LAB — URINE CULTURE: Culture: NO GROWTH

## 2022-05-06 LAB — T4, FREE: Free T4: 0.76 ng/dL (ref 0.61–1.12)

## 2022-05-06 LAB — SEDIMENTATION RATE: Sed Rate: 17 mm/hr — ABNORMAL HIGH (ref 0–15)

## 2022-05-06 LAB — TSH: TSH: 1.855 u[IU]/mL (ref 0.350–4.500)

## 2022-05-06 LAB — ETHANOL: Alcohol, Ethyl (B): <10 mg/dL (ref <10)

## 2022-05-06 LAB — LACTIC ACID, PLASMA: Lactic Acid, Venous: 2.5 mmol/L (ref 0.5–1.9)

## 2022-05-06 MED ORDER — VANCOMYCIN HCL 1500 MG/300ML IV SOLN
1500.0000 mg | Freq: Two times a day (BID) | INTRAVENOUS | Status: DC
  Administered 2022-05-06 – 2022-05-07 (×3): 1500 mg via INTRAVENOUS
  Filled 2022-05-06 (×3): qty 300

## 2022-05-06 MED ORDER — FERROUS SULFATE 325 (65 FE) MG PO TABS
325.0000 mg | ORAL_TABLET | Freq: Every day | ORAL | Status: DC
  Administered 2022-05-07 – 2022-05-13 (×7): 325 mg via ORAL
  Filled 2022-05-06 (×7): qty 1

## 2022-05-06 MED ORDER — ADULT MULTIVITAMIN W/MINERALS CH
1.0000 | ORAL_TABLET | Freq: Every day | ORAL | Status: DC
  Administered 2022-05-06 – 2022-05-13 (×8): 1 via ORAL
  Filled 2022-05-06 (×8): qty 1

## 2022-05-06 MED ORDER — FUROSEMIDE 10 MG/ML IJ SOLN
40.0000 mg | Freq: Every day | INTRAMUSCULAR | Status: DC
  Administered 2022-05-06 – 2022-05-08 (×2): 40 mg via INTRAVENOUS
  Filled 2022-05-06 (×2): qty 4

## 2022-05-06 MED ORDER — B COMPLEX-C PO TABS
1.0000 | ORAL_TABLET | Freq: Every day | ORAL | Status: DC
  Administered 2022-05-06 – 2022-05-13 (×8): 1 via ORAL
  Filled 2022-05-06 (×8): qty 1

## 2022-05-06 MED ORDER — FOLIC ACID 1 MG PO TABS
1.0000 mg | ORAL_TABLET | Freq: Every day | ORAL | Status: DC
  Administered 2022-05-06 – 2022-05-13 (×8): 1 mg via ORAL
  Filled 2022-05-06 (×8): qty 1

## 2022-05-06 NOTE — ED Notes (Signed)
Pt assisted to bedside toilet for BM at this time. Call light provided for when complete and privacy provided.

## 2022-05-06 NOTE — Progress Notes (Signed)
Pharmacy Antibiotic Note  Marco Spears is a 46 y.o. male admitted on 05/05/2022 with cellulitis.  Pharmacy has been consulted for Vancomycin dosing.  Today, 05/06/2022 Afebrile WBC WNL Renal: SCr 0.55 7/11 Bcx NG < 12h  Plan: Adjust vancomycin dose to 1500mg  IV q12h based on adjusting CrCl to expect value based on age AUC goal 400-600 Predicted AUC 425 using SCr rounded to 1 mg/dl (est CrCl using this SCr = 108 mcg/mL) Monitor renal function and need to check vancomycin levels Also on ceftriaxone and clindamycin, latter appears to be antitoxin therapy.  Typically continue x 3 days  Pharmacy will continue to follow and will adjust abx dosing whenever warranted.   Height: 6\' 3"  (190.5 cm) Weight: 104.3 kg (230 lb) IBW/kg (Calculated) : 84.5  Temp (24hrs), Avg:98.7 F (37.1 C), Min:98.3 F (36.8 C), Max:99.4 F (37.4 C)  Recent Labs  Lab 05/05/22 1632 05/05/22 2326  WBC 5.5  --   CREATININE 0.66  --   LATICACIDVEN  --  2.5*     Estimated Creatinine Clearance: 147.6 mL/min (by C-G formula based on SCr of 0.66 mg/dL).    Allergies  Allergen Reactions   Amoxicillin Rash    States it makes his skin turn red    Antimicrobials this admission: 7/11 Ceftriaxone >> 7/11 Vancomycin >> 7/12 Clindamycin >> x 5 days  Microbiology results: 7/11 BCx: NG<12 7/11 UCx: Pending   Thank you for allowing pharmacy to be a part of this patient's care.  9/11, PharmD, BCPS, BCIDP Work Cell: [phone] 05/06/2022 8:52 AM

## 2022-05-06 NOTE — Consult Note (Signed)
Patient ID: Marco Spears, male   DOB: [birth year], 46 y.o.   MRN: [id number]  HPI Marco Spears is a 46 y.o. male seen in consultation at the request of Dr. Allena Katz for cellulitis on his right lower extremity.  He does have a significant medical history of cirrhosis Childs C with ascites increased INR, increased bilirubin.  He is malnourished polysubstance dependence and is homeless.  He did have a history of hepatitis C and apparently was treated about 6 years ago. He reports generalized pain worse on the right leg.  Has difficulty with range of motion.  He is somewhat somnolent is able to carry a conversation and answer questions appropriately. Labs reviewed showing evidence of pancytopenia with significant thrombocytopenia.  INR is 1.5, platelets of 51,000 hemoglobin of 10.  Urine drug screen positive for amphetamines and cannabinoids.  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  There is evidence of pelvic ascites there is evidence of cellulitis to involve the thigh and upper leg.  There is no evidence of necrotizing infection or drainable collections  HPI  Past Medical History:  Diagnosis Date   Cirrhosis of liver (HCC)    Heart murmur     History reviewed. No pertinent surgical history.  History reviewed. No pertinent family history.  Social History Social History   Tobacco Use   Smoking status: Some Days    Types: Cigarettes   Smokeless tobacco: Never  Substance Use Topics   Alcohol use: Never   Drug use: Yes    Types: Marijuana    Allergies  Allergen Reactions   Amoxicillin Rash    States it makes his skin turn red    Current Facility-Administered Medications  Medication Dose Route Frequency Provider Last Rate Last Admin   acetaminophen (TYLENOL) tablet 650 mg  650 mg Oral Q6H PRN Gertha Calkin, MD       Or    acetaminophen (TYLENOL) suppository 650 mg  650 mg Rectal Q6H PRN Gertha Calkin, MD       bisacodyl (DULCOLAX) EC tablet 5 mg  5 mg Oral Daily PRN Gertha Calkin, MD       cefTRIAXone (ROCEPHIN) 2 g in sodium chloride 0.9 % 100 mL IVPB  2 g Intravenous Q24H Irena Cords V, MD       clindamycin (CLEOCIN) capsule 600 mg  600 mg Oral Q8H Irena Cords V, MD   600 mg at 05/06/22 0529   docusate sodium (COLACE) capsule 100 mg  100 mg Oral BID Gertha Calkin, MD   100 mg at 05/05/22 2314   lactated ringers infusion   Intravenous Continuous Gertha Calkin, MD 75 mL/hr at 05/05/22 2310 New Bag at 05/05/22 2310   lactulose (CHRONULAC) 10 GM/15ML solution 30 g  30 g Oral BID Gertha Calkin, MD       morphine (PF) 2 MG/ML injection 2 mg  2 mg Intravenous Q2H PRN Gertha Calkin, MD   2 mg at 05/06/22 0340   ondansetron (ZOFRAN) tablet 4 mg  4 mg Oral Q6H PRN Gertha Calkin, MD       Or   ondansetron (ZOFRAN) injection 4 mg  4 mg Intravenous Q6H PRN Gertha Calkin, MD       oxyCODONE (Oxy IR/ROXICODONE) immediate release tablet 5 mg  5 mg Oral Q4H PRN Gertha Calkin, MD       polyethylene glycol (MIRALAX / GLYCOLAX) packet 17 g  17 g Oral Daily PRN Gertha Calkin, MD       spironolactone (ALDACTONE) tablet 25 mg  25 mg Oral BID Irena Cords V, MD   25 mg at 05/05/22 2314   vancomycin (VANCOREADY) IVPB 1500 mg/300 mL  1,500 mg Intravenous Q12H Aleda Grana, RPH       Current Outpatient Medications  Medication Sig Dispense Refill   B Complex-C (B-COMPLEX WITH VITAMIN C) tablet Take 1 tablet by mouth daily.     ferrous sulfate 325 (65 FE) MG tablet Take 325 mg by mouth daily with breakfast.     folic acid (FOLVITE) 1 MG tablet Take 1 mg by mouth daily.     furosemide (LASIX) 40 MG tablet Take 1 tablet (40 mg total) by mouth daily. 30 tablet 0   lactulose (CHRONULAC) 10 GM/15ML solution Take 45 mLs (30 g total) by mouth 2 (two) times daily. 3000 mL 0   Multiple Vitamin (MULTIVITAMIN WITH MINERALS) TABS tablet Take 1  tablet by mouth daily.     spironolactone (ALDACTONE) 100 MG tablet Take 1 tablet (100 mg total) by mouth daily. 30 tablet 0   magic mouthwash (nystatin, hydrocortisone, diphenhydrAMINE) suspension Take 5 mLs by mouth 3 (three) times daily as needed for mouth pain. (Patient not taking: Reported on 05/06/2022) 100 mL 0     Review of Systems Full ROS  was asked and was negative except for the information on the HPI  Physical Exam Blood pressure 113/70, pulse 90, temperature 99.3 F (37.4 C), temperature source Oral, resp. rate 18, height 6\' 3"  (1.905 m), weight 104.3 kg, SpO2 96 %. CONSTITUTIONAL: Malnourished and somewhat somnolent he is able to answer appropriately. Mouth: Poor dentition with some of the incisors missing EYES: Pupils are equal, round,  Sclera are non-icteric. EARS, NOSE, MOUTH AND THROAT: The oral mucosa is pink and moist. Hearing is intact to voice. LYMPH NODES:  Lymph nodes in the neck are normal. RESPIRATORY:  Lungs are clear. There is normal respiratory effort, with equal breath sounds bilaterally, and without pathologic use of accessory muscles. CARDIOVASCULAR: Heart is regular without murmurs, gallops, or rubs. GI: The abdomen is  soft, there is evidence of ascites with positive wave sign. there are no palpable masses. There is no hepatosplenomegaly. There are normal bowel sounds  There is no evidence of peritonitis GU: Rectal deferred.   MUSCULOSKELETAL: Edema lower extremities bilaterally, on the right edema is  up to the upper thigh, there is blanching erythema.  There is no evidence of necrotizing infection.  There is tenderness to palpation.  There is no subcutaneous air, there is  also an effusion on the right knee NEUROLOGIC: Motor and sensation is grossly normal. Cranial nerves are grossly intact. PSYCH:  Oriented to person, place and time. Affect is normal.  Data Reviewed  I have personally reviewed the patient's imaging, laboratory findings and medical  records.    Assessment/Plan 46 year old male with end-stage liver disease evidenced by 52 Cirrhosis now presents with failure to thrive on cellulitis of his right lower extremity associated with likely decompensated liver failure.  Very challenging situation from both ethical perspective and therapeutic perspective.  He is homeless which makes things very complicated.  I had a candid discussion with him regarding goals of care and he is adamant that he wishes to have aggressive medical therapy  to include full code and any potential surgical intervention to save his life.  Life expectancy is not good given his malnutrition and level of cirrhosis. Fortunately, there is no need for emergent surgical intervention.  I do think he does have a component of a right knee effusion that may need to be sorted by our musculoskeletal specialist.  We will benefit from broad-spectrum antibiotic and if possible optimization of his cirrhosis.  Might need to involve hepatology to further assess liver issues.  From a general surgery perspective not much I can offer.  I will see him on a as needed basis. Note that I spent 75 minutes in this encounter including personally reviewing imaging studies, coordinating his care, counseling the patient to include end-of-life issues and performing appropriate documentation   Sterling Big, MD FACS General Surgeon 05/06/2022, 10:11 AM

## 2022-05-06 NOTE — Progress Notes (Signed)
[facility] NAME: Marco Spears    MR#:  [id number]  DATE OF BIRTH:  1976-02-02  SUBJECTIVE:   patient came in with significant pain right knee acute on server 423 days with ongoing chronic bilateral lower extremity swelling. [place] and has been doing yard work to make ends meet. Got out of jail three weeks ago. Patient said he was in the jail for 20+ years. Patient denies any IV drug use.   VITALS:  Blood pressure 116/71, pulse 92, temperature 98.9 F (37.2 C), resp. rate 18, height _0  (1.905 m), weight 104.3 kg, SpO2 97 %.  PHYSICAL EXAMINATION:   GENERAL:  46 y.o.-year-old patient lying in the bed with no acute distress.  LUNGS: Normal breath sounds bilaterally, no wheezing, rales, rhonchi.  CARDIOVASCULAR: S1, S2 normal. No murmurs, rubs, or gallops.  ABDOMEN: Soft, nontender, nondistended. Bowel sounds present.  EXTREMITIES:   Bilateral leg edema. Multiple tattoo+ in UE NEUROLOGIC: nonfocal  patient is alert and awake   LABORATORY PANEL:  CBC Recent Labs  Lab 05/05/22 1632  WBC 5.5  HGB 10.8*  HCT 31.5*  PLT 51*    Chemistries  Recent Labs  Lab 05/05/22 1632  NA 132*  K 3.3*  CL 100  CO2 27  GLUCOSE 117*  BUN 14  CREATININE 0.66  CALCIUM 7.6*  AST 45*  ALT 36  ALKPHOS 72  BILITOT 4.6*   Cardiac Enzymes No results for input(s): "TROPONINI" in the last 168 hours. RADIOLOGY:  CT FEMUR RIGHT W CONTRAST  Result Date: 05/05/2022 CLINICAL DATA:  Redness/swelling to right leg EXAM: CT OF THE LOWER RIGHT EXTREMITY WITH CONTRAST CT OF THE RIGHT TIBIA AND FIBULA WITH CONTRAST CT OF THE RIGHT FOOT WITH CONTRAST TECHNIQUE: Multidetector CT imaging of the lower right extremity was performed according to the standard protocol following intravenous contrast administration. RADIATION DOSE REDUCTION: This exam was performed according to the departmental dose-optimization program which includes  automated exposure control, adjustment of the mA and/or kV according to patient size and/or use of iterative reconstruction technique. CONTRAST:  129m OMNIPAQUE IOHEXOL 300 MG/ML  SOLN COMPARISON:  Right lower extremity venous Doppler dated 05/05/2022 FINDINGS: CT of the right lower extremity performed, extending from the level of the lower pelvis through the right foot. Visualized pelvis is notable for moderate pelvic ascites and right inguinal nodes measuring up to 19 mm short axis (series 8/image 99), likely reactive. No evidence of DVT. Visualized osseous structures are within normal limits. No fracture is seen. No cortical destruction to suggest osteomyelitis. No drainable fluid collection/abscess. Mild subcutaneous edema along the anterior upper thigh. Additional subcutaneous edema along the calf, more prominent laterally. Mild subcutaneous edema with skin thickening along the dorsal foot. These findings correspond to the clinical appearance of cellulitis. No intramuscular abnormality. IMPRESSION: Cellulitis along the right lower extremity, as above. No drainable fluid collection/abscess. No evidence of osteomyelitis. Prominent right inguinal nodes, likely reactive. Moderate pelvic ascites, incompletely visualized. Electronically Signed   By: SJulian HyM.D.   On: 05/05/2022 21:15   CT TIBIA FIBULA RIGHT W CONTRAST  Result Date: 05/05/2022 CLINICAL DATA:  Redness/swelling to right leg EXAM: CT OF THE LOWER RIGHT EXTREMITY WITH CONTRAST CT OF THE RIGHT TIBIA AND FIBULA WITH CONTRAST CT OF THE RIGHT FOOT WITH CONTRAST TECHNIQUE: Multidetector CT imaging of the lower right extremity was performed according to the standard protocol following intravenous contrast administration. RADIATION DOSE REDUCTION:  This exam was performed according to the departmental dose-optimization program which includes automated exposure control, adjustment of the mA and/or kV according to patient size and/or use of  iterative reconstruction technique. CONTRAST:  176m OMNIPAQUE IOHEXOL 300 MG/ML  SOLN COMPARISON:  Right lower extremity venous Doppler dated 05/05/2022 FINDINGS: CT of the right lower extremity performed, extending from the level of the lower pelvis through the right foot. Visualized pelvis is notable for moderate pelvic ascites and right inguinal nodes measuring up to 19 mm short axis (series 8/image 99), likely reactive. No evidence of DVT. Visualized osseous structures are within normal limits. No fracture is seen. No cortical destruction to suggest osteomyelitis. No drainable fluid collection/abscess. Mild subcutaneous edema along the anterior upper thigh. Additional subcutaneous edema along the calf, more prominent laterally. Mild subcutaneous edema with skin thickening along the dorsal foot. These findings correspond to the clinical appearance of cellulitis. No intramuscular abnormality. IMPRESSION: Cellulitis along the right lower extremity, as above. No drainable fluid collection/abscess. No evidence of osteomyelitis. Prominent right inguinal nodes, likely reactive. Moderate pelvic ascites, incompletely visualized. Electronically Signed   By: SJulian HyM.D.   On: 05/05/2022 21:15   CT FOOT RIGHT W CONTRAST  Result Date: 05/05/2022 CLINICAL DATA:  Redness/swelling to right leg EXAM: CT OF THE LOWER RIGHT EXTREMITY WITH CONTRAST CT OF THE RIGHT TIBIA AND FIBULA WITH CONTRAST CT OF THE RIGHT FOOT WITH CONTRAST TECHNIQUE: Multidetector CT imaging of the lower right extremity was performed according to the standard protocol following intravenous contrast administration. RADIATION DOSE REDUCTION: This exam was performed according to the departmental dose-optimization program which includes automated exposure control, adjustment of the mA and/or kV according to patient size and/or use of iterative reconstruction technique. CONTRAST:  1234mOMNIPAQUE IOHEXOL 300 MG/ML  SOLN COMPARISON:  Right lower  extremity venous Doppler dated 05/05/2022 FINDINGS: CT of the right lower extremity performed, extending from the level of the lower pelvis through the right foot. Visualized pelvis is notable for moderate pelvic ascites and right inguinal nodes measuring up to 19 mm short axis (series 8/image 99), likely reactive. No evidence of DVT. Visualized osseous structures are within normal limits. No fracture is seen. No cortical destruction to suggest osteomyelitis. No drainable fluid collection/abscess. Mild subcutaneous edema along the anterior upper thigh. Additional subcutaneous edema along the calf, more prominent laterally. Mild subcutaneous edema with skin thickening along the dorsal foot. These findings correspond to the clinical appearance of cellulitis. No intramuscular abnormality. IMPRESSION: Cellulitis along the right lower extremity, as above. No drainable fluid collection/abscess. No evidence of osteomyelitis. Prominent right inguinal nodes, likely reactive. Moderate pelvic ascites, incompletely visualized. Electronically Signed   By: SrJulian Hy.D.   On: 05/05/2022 21:15   [place] Img Lower Unilateral Right  Result Date: 05/05/2022 CLINICAL DATA:  RLE pain, swelling, erythema EXAM: RIGHT LOWER EXTREMITY VENOUS DOPPLER ULTRASOUND TECHNIQUE: Gray-scale sonography with compression, as well as color and duplex ultrasound, were performed to evaluate the deep venous system(s) from the level of the common femoral vein through the popliteal and proximal calf veins. COMPARISON:  None Available. FINDINGS: VENOUS Normal compressibility of the common femoral, superficial femoral, and popliteal veins, as well as the visualized calf veins. Visualized portions of profunda femoral vein and great saphenous vein unremarkable. No filling defects to suggest DVT on grayscale or color Doppler imaging. Doppler waveforms show normal direction of venous flow, normal respiratory plasticity and response to augmentation.  Limited views of the contralateral common femoral vein are unremarkable.  Other: Incidental lymph node identified in the right groin, which measures 4 x 1.4 cm. This node has a fatty hilum. IMPRESSION: No evidence of DVT in the right lower extremity. Electronically Signed   By: Margaretha Sheffield M.D.   On: 05/05/2022 18:12    Assessment and Plan Marco Spears is a 47 y.o. male with a past history of alcoholic cirrhosis, pancytopenia who comes ED complaining of pain, swelling, redness of the right leg that started this morning.  Constant, worsening.  Denies trauma.  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  Right leg cellulitis/sepsis right knee swelling ? Septic arthritis -- patient came in with severe right knee pain acute onset 3 to 4 days, tachycardia, tachypnea, elevated lactic acid, right knee erythema -- on IV vancomycin and Rocephin -- orthopedic consultation with Dr. Mack Guise -- ESR pending -- elevated CRP  Chronic of cirrhosis of liver alcoholic/history of hepatitis C Pancytopenia bilateral lower extremity edema -- resume spironolactone and will give IV Lasix -- lower extremity ultrasound negative for DVT -- continue lactulose -- ammonia within normal limit  polysubstance drug abuse failure to thrive Homeless -- urine drug screen positive for cannabinoid and amphetamine -- serum ethanol negative -- patient denies IV drug use         Procedures: Family communication : none Consults : orthopedic CODE STATUS: full DVT Prophylaxis : SCD Level of care: Telemetry Medical Status is: Inpatient Remains inpatient appropriate because: right knee infection    TOTAL TIME TAKING CARE OF THIS PATIENT: 35 minutes.  >50% time spent on counselling and coordination of care  Note: This dictation was prepared with Dragon dictation along with smaller phrase technology. Any transcriptional errors that result from this process are  unintentional.  Fritzi Mandes M.D    [facility]   CC: Primary care physician; Pcp, No

## 2022-05-06 NOTE — Assessment & Plan Note (Addendum)
Patient presented with extensive right leg cellulitis, due to, severity and extent patient started on triple antibiotic regimen with p.o. clindamycin. -Due to concern for septic bursitis orthopedics consulted. -MRI of the right knee showed diffuse mild subcutaneous soft tissue swelling/edema and skin thickening consistent with cellulitis, small focal fluid collection in the region of the prepatellar bursa could reflect bursitis could not exclude septic bursitis.  No findings suspicious for septic arthritis or osteomyelitis.  Very small knee joint effusion and 1 small air collection possibly recent joint aspiration.  Mild/early myositis but no findings for pyomyositis.  Incidental medial meniscus tear. -Patient seen by orthopedics and patient underwent irrigation and drainage of right knee prepatellar bursitis. -Deep surgical wound cultures grew group A strep. -ID consulted and antibiotics changed to IV cefazolin with resolution of sepsis and clinical improvement. -Patient will be discharged on 2 weeks of Keflex 500 mg every 6 hours until 05/28/2022 per ID. -Outpatient follow-up with ID and orthopedics. We will follow cultures and sensitivity. As needed Tylenol for pain medications.

## 2022-05-06 NOTE — TOC Initial Note (Signed)
Transition of Care [facility]) - Initial/Assessment Note    Patient Details  Name: Marco Spears MRN: [id number] Date of Birth: 03/28/1976  Transition of Care [facility]) CM/SW Contact:    Chapman Fitch, RN Phone Number: 05/06/2022, 2:27 PM  Clinical Narrative:                  Patient was assessed by [facility] 04/22/22.  See note from that admission below  "Patient reiterates that he does not have his prison ID, so he is unable to go to homeless shelter.  He states DMV told him he cannot get an ID because he does not have his prison ID.  He states he cannot get in touch with his friend at this point.  RNCM discussed patient keeping his ID and other important documents in a place he is able to easy get them or with someone he can contact easily to avoid these situations.  Patient states he will get his paperwork and be sure to have it where he can reach it in the future.  He plans to work with DSS and get disability, food stamps and ID. RNCM provided the phone number for community resources, probation department and shelters to assist patient.   Offered substance abuse rehabilitation, patient refuses rehabilitaiton at this time, states that he was falsely positive for meth and he "does not want anything to do with rehabilitation."   RNCM reached out to [facility], states they are unsure of being able to accommodate without ID, but patient's information was provided to clinic and they will look into taking him for his medical needs.  Application for [facility] provided to patient, with the explanation to complete application and follow up with clinic ASAP due to medical needs. Patient agrees with this plan, verbalizes understanding.   RNCM again explored the possibility of friends and family accommodating patient.  Patient states again that brother is an addict and parents are deceased.  He states being in prison so long, he does not have friends.  RNCM paged chaplain to provide encouragement and a  listening ear.   Medication Management provided medications for patient, who was verbally gracious for assistance.   Patient states he understands he is homeless and would like to be discharged.  He states he will continue to work with resources given in the community.He states he has a IT trainer to assist him with disability and food stamps.  He does not recall the name or phone number at this time.  He plans to see the worker after discharge.   Addendum 1519 DSS made aware of patient discharge.  They were unable to advise RNCM of caseworker at this time."  Patient states since discharge he has been staying the the woods/or under a bridge.  And is brother is allowed a visitor 1 night a week at a boarding house, so when he is able to he goes there.  - Patient confirms he still has resources packet from previous admission - Patient still doesn't have an ID, which limits him from medical care and homeless shelters.  Patient states his brother is supposed to bringing him his prison ID card this week.  And also his medicaid card. Patient is not sure if he has full medicaid or family medicaid - Patient states he will be going back to the street at discharge, and may need a cab voucher to his brothers.  - TOC to follow for medication needs  Patient Goals and CMS Choice        Expected Discharge Plan and Services                                                Prior Living Arrangements/Services                       Activities of Daily Living Home Assistive Devices/Equipment: None ADL Screening (condition at time of admission) Patient's cognitive ability adequate to safely complete daily activities?: Yes Is the patient deaf or have difficulty hearing?: No Does the patient have difficulty seeing, even when wearing glasses/contacts?: No Does the patient have difficulty concentrating, remembering, or making decisions?: No Patient able to express need for  assistance with ADLs?: Yes Does the patient have difficulty dressing or bathing?: No Independently performs ADLs?: Yes (appropriate for developmental age) Does the patient have difficulty walking or climbing stairs?: Yes Weakness of Legs: Both Weakness of Arms/Hands: None  Permission Sought/Granted                  Emotional Assessment              Admission diagnosis:  Polysubstance abuse (HCC) [F19.10] Cellulitis of right leg [L03.115] Sepsis (HCC) [A41.9] Cirrhosis of liver without ascites, unspecified hepatic cirrhosis type (HCC) [K74.60] Patient Active Problem List   Diagnosis Date Noted   Cellulitis 05/05/2022   Sepsis (HCC) 05/05/2022   Alcoholic cirrhosis of liver with ascites (HCC) 04/21/2022   Hypokalemia 04/21/2022   Anemia 04/21/2022   PCP:  Pcp, No Pharmacy:   [facility] [address] [place] [address] Phone: [phone] Fax: [phone]     Social Determinants of Health (SDOH) Interventions    Readmission Risk Interventions    05/06/2022    2:26 PM  Readmission Risk Prevention Plan  Transportation Screening Complete  Medication Review Oceanographer) Complete

## 2022-05-06 NOTE — ED Notes (Signed)
Pt assisted back into bed and bedside toilet cleaned with new pan in place for next use. Pt provided with PO juice per request.

## 2022-05-06 NOTE — Consult Note (Signed)
ORTHOPAEDIC CONSULTATION  REQUESTING PHYSICIAN: Para Skeans, MD  Chief Complaint: Right lower extremity swelling and knee pain  HPI: Marco Spears is a 46 y.o. male who complains of spontaneous onset of diffuse right lower extremity edema beginning on Monday.  Patient states he woke up with right lower extremity edema with erythema and knee pain.  Review of the medical record however shows that the patient was admitted on 04/20/2022 for bilateral lower extremity edema.  Patient has a history of liver cirrhosis with ascites and polysubstance abuse.  Patient explains that he is homeless.  He states he has had lower extremity edema before but is never had one leg so edematous previously.  Past Medical History:  Diagnosis Date   Cirrhosis of liver ([place])    Heart murmur    History reviewed. No pertinent surgical history. Social History   Socioeconomic History   Marital status: Single    Spouse name: Not on file   Number of children: Not on file   Years of education: Not on file   Highest education level: Not on file  Occupational History   Not on file  Tobacco Use   Smoking status: Some Days    Types: Cigarettes   Smokeless tobacco: Never  Substance and Sexual Activity   Alcohol use: Never   Drug use: Yes    Types: Marijuana   Sexual activity: Not on file  Other Topics Concern   Not on file  Social History Narrative   Not on file   Social Determinants of Health   Financial Resource Strain: Not on file  Food Insecurity: Not on file  Transportation Needs: Not on file  Physical Activity: Not on file  Stress: Not on file  Social Connections: Not on file   History reviewed. No pertinent family history. Allergies  Allergen Reactions   Amoxicillin Rash    States it makes his skin turn red   Prior to Admission medications   Medication Sig Start Date End Date Taking? Authorizing Provider  B Complex-C (B-COMPLEX WITH VITAMIN C) tablet Take 1 tablet by mouth daily.   Yes  [provider]  ferrous sulfate 325 (65 FE) MG tablet Take 325 mg by mouth daily with breakfast.   Yes [provider]  folic acid (FOLVITE) 1 MG tablet Take 1 mg by mouth daily.   Yes [provider]  furosemide (LASIX) 40 MG tablet Take 1 tablet (40 mg total) by mouth daily. 04/23/22  Yes Sharen Hones, MD  lactulose (CHRONULAC) 10 GM/15ML solution Take 45 mLs (30 g total) by mouth 2 (two) times daily. 04/22/22  Yes Sharen Hones, MD  Multiple Vitamin (MULTIVITAMIN WITH MINERALS) TABS tablet Take 1 tablet by mouth daily.   Yes [provider]  spironolactone (ALDACTONE) 100 MG tablet Take 1 tablet (100 mg total) by mouth daily. 04/23/22  Yes Sharen Hones, MD  magic mouthwash (nystatin, hydrocortisone, diphenhydrAMINE) suspension Take 5 mLs by mouth 3 (three) times daily as needed for mouth pain. Patient not taking: Reported on 05/06/2022 04/22/22   Sharen Hones, MD   CT FEMUR RIGHT W CONTRAST  Result Date: 05/05/2022 CLINICAL DATA:  Redness/swelling to right leg EXAM: CT OF THE LOWER RIGHT EXTREMITY WITH CONTRAST CT OF THE RIGHT TIBIA AND FIBULA WITH CONTRAST CT OF THE RIGHT FOOT WITH CONTRAST TECHNIQUE: Multidetector CT imaging of the lower right extremity was performed according to the standard protocol following intravenous contrast administration. RADIATION DOSE REDUCTION: This exam was performed according to the  departmental dose-optimization program which includes automated exposure control, adjustment of the mA and/or kV according to patient size and/or use of iterative reconstruction technique. CONTRAST:  120m OMNIPAQUE IOHEXOL 300 MG/ML  SOLN COMPARISON:  Right lower extremity venous Doppler dated 05/05/2022 FINDINGS: CT of the right lower extremity performed, extending from the level of the lower pelvis through the right foot. Visualized pelvis is notable for moderate pelvic ascites and right inguinal nodes measuring up to 19 mm short axis (series 8/image 99),  likely reactive. No evidence of DVT. Visualized osseous structures are within normal limits. No fracture is seen. No cortical destruction to suggest osteomyelitis. No drainable fluid collection/abscess. Mild subcutaneous edema along the anterior upper thigh. Additional subcutaneous edema along the calf, more prominent laterally. Mild subcutaneous edema with skin thickening along the dorsal foot. These findings correspond to the clinical appearance of cellulitis. No intramuscular abnormality. IMPRESSION: Cellulitis along the right lower extremity, as above. No drainable fluid collection/abscess. No evidence of osteomyelitis. Prominent right inguinal nodes, likely reactive. Moderate pelvic ascites, incompletely visualized. Electronically Signed   By: SJulian HyM.D.   On: 05/05/2022 21:15   CT TIBIA FIBULA RIGHT W CONTRAST  Result Date: 05/05/2022 CLINICAL DATA:  Redness/swelling to right leg EXAM: CT OF THE LOWER RIGHT EXTREMITY WITH CONTRAST CT OF THE RIGHT TIBIA AND FIBULA WITH CONTRAST CT OF THE RIGHT FOOT WITH CONTRAST TECHNIQUE: Multidetector CT imaging of the lower right extremity was performed according to the standard protocol following intravenous contrast administration. RADIATION DOSE REDUCTION: This exam was performed according to the departmental dose-optimization program which includes automated exposure control, adjustment of the mA and/or kV according to patient size and/or use of iterative reconstruction technique. CONTRAST:  1262mOMNIPAQUE IOHEXOL 300 MG/ML  SOLN COMPARISON:  Right lower extremity venous Doppler dated 05/05/2022 FINDINGS: CT of the right lower extremity performed, extending from the level of the lower pelvis through the right foot. Visualized pelvis is notable for moderate pelvic ascites and right inguinal nodes measuring up to 19 mm short axis (series 8/image 99), likely reactive. No evidence of DVT. Visualized osseous structures are within normal limits. No fracture is  seen. No cortical destruction to suggest osteomyelitis. No drainable fluid collection/abscess. Mild subcutaneous edema along the anterior upper thigh. Additional subcutaneous edema along the calf, more prominent laterally. Mild subcutaneous edema with skin thickening along the dorsal foot. These findings correspond to the clinical appearance of cellulitis. No intramuscular abnormality. IMPRESSION: Cellulitis along the right lower extremity, as above. No drainable fluid collection/abscess. No evidence of osteomyelitis. Prominent right inguinal nodes, likely reactive. Moderate pelvic ascites, incompletely visualized. Electronically Signed   By: SrJulian Hy.D.   On: 05/05/2022 21:15   CT FOOT RIGHT W CONTRAST  Result Date: 05/05/2022 CLINICAL DATA:  Redness/swelling to right leg EXAM: CT OF THE LOWER RIGHT EXTREMITY WITH CONTRAST CT OF THE RIGHT TIBIA AND FIBULA WITH CONTRAST CT OF THE RIGHT FOOT WITH CONTRAST TECHNIQUE: Multidetector CT imaging of the lower right extremity was performed according to the standard protocol following intravenous contrast administration. RADIATION DOSE REDUCTION: This exam was performed according to the departmental dose-optimization program which includes automated exposure control, adjustment of the mA and/or kV according to patient size and/or use of iterative reconstruction technique. CONTRAST:  12533mMNIPAQUE IOHEXOL 300 MG/ML  SOLN COMPARISON:  Right lower extremity venous Doppler dated 05/05/2022 FINDINGS: CT of the right lower extremity performed, extending from the level of the lower pelvis through the right foot. Visualized pelvis is notable for  moderate pelvic ascites and right inguinal nodes measuring up to 19 mm short axis (series 8/image 99), likely reactive. No evidence of DVT. Visualized osseous structures are within normal limits. No fracture is seen. No cortical destruction to suggest osteomyelitis. No drainable fluid collection/abscess. Mild subcutaneous  edema along the anterior upper thigh. Additional subcutaneous edema along the calf, more prominent laterally. Mild subcutaneous edema with skin thickening along the dorsal foot. These findings correspond to the clinical appearance of cellulitis. No intramuscular abnormality. IMPRESSION: Cellulitis along the right lower extremity, as above. No drainable fluid collection/abscess. No evidence of osteomyelitis. Prominent right inguinal nodes, likely reactive. Moderate pelvic ascites, incompletely visualized. Electronically Signed   By: Julian Hy M.D.   On: 05/05/2022 21:15   US Venous Img Lower Unilateral Right  Result Date: 05/05/2022 CLINICAL DATA:  RLE pain, swelling, erythema EXAM: RIGHT LOWER EXTREMITY VENOUS DOPPLER ULTRASOUND TECHNIQUE: Gray-scale sonography with compression, as well as color and duplex ultrasound, were performed to evaluate the deep venous system(s) from the level of the common femoral vein through the popliteal and proximal calf veins. COMPARISON:  None Available. FINDINGS: VENOUS Normal compressibility of the common femoral, superficial femoral, and popliteal veins, as well as the visualized calf veins. Visualized portions of profunda femoral vein and great saphenous vein unremarkable. No filling defects to suggest DVT on grayscale or color Doppler imaging. Doppler waveforms show normal direction of venous flow, normal respiratory plasticity and response to augmentation. Limited views of the contralateral common femoral vein are unremarkable. Other: Incidental lymph node identified in the right groin, which measures 4 x 1.4 cm. This node has a fatty hilum. IMPRESSION: No evidence of DVT in the right lower extremity. Electronically Signed   By: Margaretha Sheffield M.D.   On: 05/05/2022 18:12    Positive ROS: All other systems have been reviewed and were otherwise negative with the exception of those mentioned in the HPI and as above.  Physical Exam: General: Alert, no acute  distress  MUSCULOSKELETAL: Right lower extremity: Patient has diffuse edema.  There is a large erythematous area over the anterior right thigh.  Patient also has erythema over the anterior right knee.  Patient has numerous sores over his bilateral lower legs in different stages of healing.  Patient is unsure of the etiology of these lesions.  There is no active drainage seen from these lesions.  Patient has soft compartments and his lower legs and right thigh.  He can flex his knee to approximately 70 to 80 degrees with passive assistance and can extend his knee to approximately -10 to 20 degrees.  Patient has point tenderness over the anterior and lateral knee.  Distally he is neurovascular intact.  Patient had tenderness over the anterior knee on exam in the infrapatellar area.  Given the generalized swelling, no area of fluctuance could be palpated.    Assessment: Right lower extremity edema with right knee pain and erythema  Plan: I was consulted by Dr. Fritzi Mandes today regarding this patient's right knee pain and swelling.  It was requested that I aspirate the right knee for possible septic right knee joint.  Patient was also seen today by Dr. Caroleen Hamman.  A CT scan of the right femur and right tibia were performed today.  The CT scan showed cellulitis along the right lower extremity with no drainable collection/abscess.  There is no evidence of osteomyelitis.  Patient had reactive right inguinal nodes and moderate pelvic ascites.  Patient has met the medical criteria for  sepsis.  His white blood cell count is 5.5.  His urinalysis was negative for infection.  An ultrasound of the right lower extremity performed yesterday was negative for DVT.  I explained to the patient that I recommend an aspiration of his right knee to rule out a septic right knee joint.  Patient agreed to the procedure.  Procedure note: Patient was prepped with Betadine over the lateral aspect of the right knee.  Under sterile  conditions I injected 10 cc of 1% lidocaine plain.  After few minutes the patient was reprepped with Betadine.  Using sterile technique the patient had his right knee aspirated from a lateral approach.  Patient had a proximately 10 cc of blood-tinged synovial fluid withdrawn.  There is no evidence of purulence on his aspirate.  I hand-delivered the sample to the lab for cell count, crystals, Gram stain and culture.  I am recommending MRI evaluation of the right knee to rule out soft tissue infection versus intra-articular pathology causing right knee swelling.  I will follow-up with the synovial fluid culture results and MRI once they are available.  Patient will be kept n.p.o. after midnight in case either the lab or MRI suggest soft tissue infection requiring surgical intervention.     Thornton Park, MD    05/06/2022 7:30 PM

## 2022-05-06 NOTE — Assessment & Plan Note (Addendum)
    Latest Ref Rng & Units 05/05/2022    4:32 PM 04/22/2022    6:10 AM 04/21/2022    6:20 AM  CBC  WBC 4.0 - 10.5 K/uL 5.5  2.4  2.8   Hemoglobin 13.0 - 17.0 g/dL 01.7  9.7  9.7   Hematocrit 39.0 - 52.0 % 31.5  28.7  27.9   Platelets 150 - 400 K/uL 51  49  54    Suspect anemia of chronic disease, alcoholism, possible iron deficiency and chronic blood loss from gastritis and upper GI issues. We will follow CBC and type and screen and transfuse as indicated. -Patient initially placed on IV PPI and subsequently transition to oral PPI. -Hemoglobin remained stable at 10.5 by day of discharge.

## 2022-05-06 NOTE — Assessment & Plan Note (Addendum)
-  Patient noted with history of chronic cirrhosis of the liver alcoholic/history of hepatitis C treated/pancytopenia/anasarca/?  Ascites.   -Patient received IV Lasix during the hospitalization and resumed back on home regimen spironolactone.   -Patient also placed on lactulose initially, ammonia levels within normal limits.   -Patient initially placed on low-dose KCl while on IV Lasix.  -Patient -5.465 L during the hospitalization.  -Patient subsequently transition to oral Lasix 40 twice daily as well as spinal lactone 100 mg daily.   -Patient underwent ultrasound-guided paracentesis with 6 L of clear yellow fluid removed on 05/13/2022 without any complications.   -Outpatient follow-up with PCP.   -We will need repeat labs 1 week to follow-up on electrolytes and renal function.   -Patient maintained on Ativan withdrawal protocol during the hospitalization.

## 2022-05-06 NOTE — Assessment & Plan Note (Addendum)
-   Repleted during the hospitalization. -We will need repeat labs done in 1 week.

## 2022-05-06 NOTE — Assessment & Plan Note (Addendum)
Patient met sepsis criteria with tachycardia, tachypnea, acute elevated lactic acid level.  -Patient placed on IV fluids.  -See above #1 cellulitis.  -Sepsis had resolved by day of discharge.

## 2022-05-07 ENCOUNTER — Inpatient Hospital Stay: Payer: Self-pay | Admitting: Certified Registered Nurse Anesthetist

## 2022-05-07 ENCOUNTER — Encounter
Admission: EM | Disposition: A | Discharge status: Home / Self Care | Payer: Self-pay | Source: Home / Self Care | Attending: Internal Medicine

## 2022-05-07 ENCOUNTER — Encounter: Payer: Self-pay | Admitting: Internal Medicine

## 2022-05-07 ENCOUNTER — Other Ambulatory Visit: Payer: Self-pay

## 2022-05-07 DIAGNOSIS — K7469 Other cirrhosis of liver: Secondary | ICD-10-CM

## 2022-05-07 DIAGNOSIS — B1920 Unspecified viral hepatitis C without hepatic coma: Secondary | ICD-10-CM

## 2022-05-07 HISTORY — PX: INCISION AND DRAINAGE: SHX5863

## 2022-05-07 LAB — POCT I-STAT, CHEM 8
BUN: 6 mg/dL (ref 6–20)
Calcium, Ion: 1.06 mmol/L — ABNORMAL LOW (ref 1.15–1.40)
Chloride: 96 mmol/L — ABNORMAL LOW (ref 98–111)
Creatinine, Ser: 0.6 mg/dL — ABNORMAL LOW (ref 0.61–1.24)
Glucose, Bld: 109 mg/dL — ABNORMAL HIGH (ref 70–99)
HCT: 28 % — ABNORMAL LOW (ref 39.0–52.0)
Hemoglobin: 9.5 g/dL — ABNORMAL LOW (ref 13.0–17.0)
Potassium: 3.4 mmol/L — ABNORMAL LOW (ref 3.5–5.1)
Sodium: 134 mmol/L — ABNORMAL LOW (ref 135–145)
TCO2: 24 mmol/L (ref 22–32)

## 2022-05-07 LAB — CREATININE, SERUM
Creatinine, Ser: 0.79 mg/dL (ref 0.61–1.24)
GFR, Estimated: >60 mL/min (ref >60)

## 2022-05-07 LAB — SURGICAL PCR SCREEN
MRSA, PCR: NEGATIVE
Staphylococcus aureus: POSITIVE — AB

## 2022-05-07 SURGERY — INCISION AND DRAINAGE
Anesthesia: General | Site: Knee | Laterality: Right

## 2022-05-07 MED ORDER — ONDANSETRON HCL 4 MG/2ML IJ SOLN: 4.0000 mg | Freq: Once | INTRAMUSCULAR | Status: DC | PRN

## 2022-05-07 MED ORDER — FENTANYL CITRATE (PF) 100 MCG/2ML IJ SOLN
INTRAMUSCULAR | Status: AC
  Administered 2022-05-07: 25 ug via INTRAVENOUS
  Filled 2022-05-07: qty 2

## 2022-05-07 MED ORDER — CEFAZOLIN SODIUM-DEXTROSE 2-4 GM/100ML-% IV SOLN
2.0000 g | INTRAVENOUS | Status: AC
  Administered 2022-05-07: 2 g via INTRAVENOUS

## 2022-05-07 MED ORDER — SODIUM CHLORIDE 0.9 % IV SOLN: 2.0000 g | INTRAVENOUS | Status: DC

## 2022-05-07 MED ORDER — ONDANSETRON HCL 4 MG/2ML IJ SOLN
INTRAMUSCULAR | Status: AC
  Filled 2022-05-07: qty 2

## 2022-05-07 MED ORDER — DEXMEDETOMIDINE (PRECEDEX) IN NS 20 MCG/5ML (4 MCG/ML) IV SYRINGE
PREFILLED_SYRINGE | INTRAVENOUS | Status: DC | PRN
  Administered 2022-05-07 (×2): 8 ug via INTRAVENOUS

## 2022-05-07 MED ORDER — PROPOFOL 10 MG/ML IV BOLUS
INTRAVENOUS | Status: AC
  Filled 2022-05-07: qty 20

## 2022-05-07 MED ORDER — ACETAMINOPHEN 10 MG/ML IV SOLN
INTRAVENOUS | Status: AC
  Filled 2022-05-07: qty 100

## 2022-05-07 MED ORDER — FENTANYL CITRATE (PF) 100 MCG/2ML IJ SOLN
INTRAMUSCULAR | Status: AC
  Filled 2022-05-07: qty 2

## 2022-05-07 MED ORDER — LIDOCAINE HCL (PF) 2 % IJ SOLN
INTRAMUSCULAR | Status: AC
  Filled 2022-05-07: qty 5

## 2022-05-07 MED ORDER — CEFAZOLIN SODIUM-DEXTROSE 2-4 GM/100ML-% IV SOLN
INTRAVENOUS | Status: AC
  Filled 2022-05-07: qty 100

## 2022-05-07 MED ORDER — MUPIROCIN 2 % EX OINT
1.0000 | TOPICAL_OINTMENT | Freq: Two times a day (BID) | CUTANEOUS | Status: AC
  Administered 2022-05-07 – 2022-05-12 (×10): 1 via NASAL
  Filled 2022-05-07: qty 22

## 2022-05-07 MED ORDER — CHLORHEXIDINE GLUCONATE 0.12 % MT SOLN
15.0000 mL | Freq: Once | OROMUCOSAL | Status: AC
Start: 2022-05-07 — End: 2022-05-07

## 2022-05-07 MED ORDER — CHLORHEXIDINE GLUCONATE CLOTH 2 % EX PADS
6.0000 | MEDICATED_PAD | Freq: Every day | CUTANEOUS | Status: AC
  Administered 2022-05-08 – 2022-05-12 (×3): 6 via TOPICAL

## 2022-05-07 MED ORDER — TRAMADOL HCL 50 MG PO TABS
50.0000 mg | ORAL_TABLET | Freq: Four times a day (QID) | ORAL | Status: AC
  Administered 2022-05-07 – 2022-05-10 (×12): 50 mg via ORAL
  Filled 2022-05-07 (×12): qty 1

## 2022-05-07 MED ORDER — FENTANYL CITRATE (PF) 100 MCG/2ML IJ SOLN
25.0000 ug | INTRAMUSCULAR | Status: DC | PRN
  Administered 2022-05-07 (×3): 25 ug via INTRAVENOUS

## 2022-05-07 MED ORDER — MIDAZOLAM HCL 2 MG/2ML IJ SOLN
INTRAMUSCULAR | Status: DC | PRN
  Administered 2022-05-07: 2 mg via INTRAVENOUS

## 2022-05-07 MED ORDER — FENTANYL CITRATE (PF) 100 MCG/2ML IJ SOLN
INTRAMUSCULAR | Status: DC | PRN
Start: 2022-05-07 — End: 2022-05-07
  Administered 2022-05-07 (×2): 50 ug via INTRAVENOUS

## 2022-05-07 MED ORDER — LIDOCAINE HCL (CARDIAC) PF 100 MG/5ML IV SOSY
PREFILLED_SYRINGE | INTRAVENOUS | Status: DC | PRN
  Administered 2022-05-07: 100 mg via INTRAVENOUS

## 2022-05-07 MED ORDER — CHLORHEXIDINE GLUCONATE 0.12 % MT SOLN
OROMUCOSAL | Status: AC
  Administered 2022-05-07: 15 mL via OROMUCOSAL
  Filled 2022-05-07: qty 15

## 2022-05-07 MED ORDER — DEXMEDETOMIDINE HCL IN NACL 80 MCG/20ML IV SOLN
INTRAVENOUS | Status: AC
  Filled 2022-05-07: qty 20

## 2022-05-07 MED ORDER — ONDANSETRON HCL 4 MG/2ML IJ SOLN
INTRAMUSCULAR | Status: DC | PRN
  Administered 2022-05-07: 4 mg via INTRAVENOUS

## 2022-05-07 MED ORDER — LACTATED RINGERS IV SOLN: INTRAVENOUS | Status: DC | PRN

## 2022-05-07 MED ORDER — PROPOFOL 10 MG/ML IV BOLUS
INTRAVENOUS | Status: DC | PRN
  Administered 2022-05-07: 200 mg via INTRAVENOUS

## 2022-05-07 MED ORDER — DEXAMETHASONE SODIUM PHOSPHATE 10 MG/ML IJ SOLN
INTRAMUSCULAR | Status: DC | PRN
  Administered 2022-05-07: 10 mg via INTRAVENOUS

## 2022-05-07 MED ORDER — MIDAZOLAM HCL 2 MG/2ML IJ SOLN
INTRAMUSCULAR | Status: AC
  Filled 2022-05-07: qty 2

## 2022-05-07 MED ORDER — CEFAZOLIN SODIUM-DEXTROSE 2-4 GM/100ML-% IV SOLN
2.0000 g | Freq: Three times a day (TID) | INTRAVENOUS | Status: DC
  Administered 2022-05-08 – 2022-05-13 (×16): 2 g via INTRAVENOUS
  Filled 2022-05-07 (×18): qty 100

## 2022-05-07 MED ORDER — SODIUM CHLORIDE 0.9 % IR SOLN
Status: DC | PRN
  Administered 2022-05-07: 2008 mL

## 2022-05-07 SURGICAL SUPPLY — 44 items
BNDG COHESIVE 6X5 TAN ST LF (GAUZE/BANDAGES/DRESSINGS) ×2 IMPLANT
CUFF TOURN SGL QUICK 18X4 (TOURNIQUET CUFF) IMPLANT
CUFF TOURN SGL QUICK 24 (TOURNIQUET CUFF)
CUFF TRNQT CYL 24X4X16.5-23 (TOURNIQUET CUFF) IMPLANT
DRAPE 3/4 80X56 (DRAPES) ×2 IMPLANT
DRAPE INCISE IOBAN 66X60 STRL (DRAPES) ×2 IMPLANT
DRAPE SURG 17X11 SM STRL (DRAPES) ×4 IMPLANT
DRAPE U-SHAPE 47X51 STRL (DRAPES) ×2 IMPLANT
DRSG OPSITE POSTOP 3X4 (GAUZE/BANDAGES/DRESSINGS) ×1 IMPLANT
DURAPREP 26ML APPLICATOR (WOUND CARE) ×6 IMPLANT
ELECT CAUTERY BLADE 6.4 (BLADE) ×2 IMPLANT
ELECT REM PT RETURN 9FT ADLT (ELECTROSURGICAL) ×2
ELECTRODE REM PT RTRN 9FT ADLT (ELECTROSURGICAL) ×1 IMPLANT
GAUZE SPONGE 4X4 12PLY STRL (GAUZE/BANDAGES/DRESSINGS) ×2 IMPLANT
GAUZE XEROFORM 1X8 LF (GAUZE/BANDAGES/DRESSINGS) ×2 IMPLANT
GLOVE BIOGEL PI IND STRL 9 (GLOVE) ×1 IMPLANT
GLOVE BIOGEL PI INDICATOR 9 (GLOVE) ×1
GLOVE SURG ORTHO 9.0 STRL STRW (GLOVE) ×6 IMPLANT
GOWN STRL REUS TWL 2XL XL LVL4 (GOWN DISPOSABLE) ×2 IMPLANT
GOWN STRL REUS W/ TWL LRG LVL3 (GOWN DISPOSABLE) ×1 IMPLANT
GOWN STRL REUS W/TWL LRG LVL3 (GOWN DISPOSABLE) ×1
HEMOVAC 400ML (MISCELLANEOUS) ×2
KIT DRAIN HEMOVAC JP 7FR 400ML (MISCELLANEOUS) ×1 IMPLANT
KIT TURNOVER KIT A (KITS) ×2 IMPLANT
MANIFOLD NEPTUNE II (INSTRUMENTS) ×2 IMPLANT
NDL FILTER BLUNT 18X1 1/2 (NEEDLE) ×1 IMPLANT
NDL SAFETY ECLIPSE 18X1.5 (NEEDLE) ×1 IMPLANT
NEEDLE FILTER BLUNT 18X 1/2SAF (NEEDLE) ×1
NEEDLE FILTER BLUNT 18X1 1/2 (NEEDLE) ×1 IMPLANT
NEEDLE HYPO 18GX1.5 SHARP (NEEDLE) ×1
NS IRRIG 500ML POUR BTL (IV SOLUTION) ×2 IMPLANT
PACK EXTREMITY ARMC (MISCELLANEOUS) ×2 IMPLANT
STAPLER SKIN PROX 35W (STAPLE) ×2 IMPLANT
SUT ETHIBOND #5 BRAIDED 30INL (SUTURE) ×2 IMPLANT
SUT ETHILON 4-0 (SUTURE) ×1
SUT ETHILON 4-0 FS2 18XMFL BLK (SUTURE) ×1
SUT TICRON 2-0 30IN 311381 (SUTURE) ×2 IMPLANT
SUT VIC AB 0 CT1 27 (SUTURE) ×1
SUT VIC AB 0 CT1 27XCR 8 STRN (SUTURE) ×1 IMPLANT
SUT VIC AB 1 CTX 27 (SUTURE) ×2 IMPLANT
SUTURE ETHLN 4-0 FS2 18XMF BLK (SUTURE) IMPLANT
SYR 10ML LL (SYRINGE) ×2 IMPLANT
TAPE MICROFOAM 4IN (TAPE) ×2 IMPLANT
WATER STERILE IRR 500ML POUR (IV SOLUTION) ×2 IMPLANT

## 2022-05-07 NOTE — Anesthesia Preprocedure Evaluation (Addendum)
Anesthesia Evaluation  Patient identified by MRN, date of birth, ID band Patient awake    Reviewed: Allergy & Precautions, H&P , NPO status , Patient's Chart, lab work & pertinent test results, reviewed documented beta blocker date and time   History of Anesthesia Complications Negative for: history of anesthetic complications  Airway Mallampati: II  TM Distance: >3 FB Neck ROM: full    Dental  (+) Dental Advidsory Given, Poor Dentition, Missing, Chipped   Pulmonary neg shortness of breath, neg COPD, neg recent URI, Current Smoker,    Pulmonary exam normal breath sounds clear to auscultation       Cardiovascular Exercise Tolerance: Good (-) hypertension(-) angina(-) CAD, (-) Past MI and (-) Cardiac Stents Normal cardiovascular exam(-) dysrhythmias + Valvular Problems/Murmurs  Rhythm:regular Rate:Normal     Neuro/Psych negative neurological ROS  negative psych ROS   GI/Hepatic negative GI ROS, (+) Cirrhosis   Esophageal Varices  substance abuse  methamphetamine use, Hepatitis - (s/p treatment), C  Endo/Other  negative endocrine ROS  Renal/GU negative Renal ROS  negative genitourinary   Musculoskeletal   Abdominal   Peds  Hematology negative hematology ROS (+)   Anesthesia Other Findings Past Medical History: No date: Cirrhosis of liver (HCC) No date: Heart murmur   Reproductive/Obstetrics negative OB ROS                             Anesthesia Physical Anesthesia Plan  ASA: 3  Anesthesia Plan: General   Post-op Pain Management:    Induction: Intravenous  PONV Risk Score and Plan: 2 and Ondansetron, Dexamethasone and Treatment may vary due to age or medical condition  Airway Management Planned: Oral ETT and LMA  Additional Equipment:   Intra-op Plan:   Post-operative Plan: Extubation in OR  Informed Consent: I have reviewed the patients History and Physical, chart, labs  and discussed the procedure including the risks, benefits and alternatives for the proposed anesthesia with the patient or authorized representative who has indicated his/her understanding and acceptance.     Dental Advisory Given  Plan Discussed with: Anesthesiologist, CRNA and Surgeon  Anesthesia Plan Comments:        Anesthesia Quick Evaluation

## 2022-05-07 NOTE — Progress Notes (Signed)
[facility]  - [place] [facility]   PATIENT NAME: Marco Spears    MR#:  [id number]  DATE OF BIRTH:  [birth year]  SUBJECTIVE:   patient came in with significant pain right knee acute on server 423 days with ongoing chronic bilateral lower extremity swelling. [place] and has been doing yard work to make ends meet. Got out of jail three weeks ago. Patient said he was in the jail for 20+ years. Patient denies any IV drug use.  Patient is status post right knee incision and drainage.   VITALS:  Blood pressure 111/69, pulse 78, temperature 98.1 F (36.7 C), resp. rate 16, height 6\' 3"  (1.905 m), weight 103.9 kg, SpO2 94 %.  PHYSICAL EXAMINATION:   GENERAL:  46 y.o.-year-old patient lying in the bed with no acute distress.  LUNGS: Normal breath sounds bilaterally, no wheezing, rales, rhonchi.  CARDIOVASCULAR: S1, S2 normal. No murmurs, rubs, or gallops.  ABDOMEN: Soft, nontender, nondistended. Bowel sounds present.  EXTREMITIES:   Bilateral leg edema. Multiple tattoo+ in UE NEUROLOGIC: nonfocal  patient is alert and awake   LABORATORY PANEL:  CBC Recent Labs  Lab 05/05/22 1632 05/07/22 1005  WBC 5.5  --   HGB 10.8* 9.5*  HCT 31.5* 28.0*  PLT 51*  --      Chemistries  Recent Labs  Lab 05/05/22 1632 05/07/22 0401 05/07/22 1005  NA 132*  --  134*  K 3.3*  --  3.4*  CL 100  --  96*  CO2 27  --   --   GLUCOSE 117*  --  109*  BUN 14  --  6  CREATININE 0.66   < > 0.60*  CALCIUM 7.6*  --   --   AST 45*  --   --   ALT 36  --   --   ALKPHOS 72  --   --   BILITOT 4.6*  --   --    < > = values in this interval not displayed.    Cardiac Enzymes No results for input(s): "TROPONINI" in the last 168 hours. RADIOLOGY:  MR KNEE RIGHT WO CONTRAST  Result Date: 05/07/2022 CLINICAL DATA:  Swelling/inflammation. EXAM: MRI OF THE RIGHT KNEE WITHOUT CONTRAST TECHNIQUE: Multiplanar, multisequence MR imaging of the knee was performed. No  intravenous contrast was administered. COMPARISON:  CT scan 05/05/2022 FINDINGS: Diffuse and marked subcutaneous soft tissue swelling/edema and skin thickening consistent with cellulitis. Small focal fluid collection measuring 2 cm in the region of the prepatellar bursa could reflect bursitis. Could not exclude septic bursitis. Very small joint effusion with a small amount of air likely from a joint aspiration. Recommend clinical correlation. No findings suspicious for septic arthritis or osteomyelitis. Mild edema like signal changes in the anterior and posterior compartment musculature suggesting mild/early myositis but no findings for pyomyositis. Mild knee joint degenerative changes with degenerative chondrosis. Posterior horn medial meniscus tears noted. The knee ligaments are intact. IMPRESSION: 1. Diffuse and marked subcutaneous soft tissue swelling/edema and skin thickening consistent with cellulitis. 2. Small focal fluid collection in the region of the prepatellar bursa could reflect bursitis. Could not exclude septic bursitis. 3. No findings suspicious for septic arthritis or osteomyelitis. 4. Very small knee joint effusion and 1 small air collection possibly recent joint aspiration. 5. Mild/early myositis but no findings for pyomyositis. 6. Incidental medial meniscus tear Electronically Signed   By: 07/06/2022 M.D.   On: 05/07/2022 07:18   CT FEMUR  RIGHT W CONTRAST  Result Date: 05/05/2022 CLINICAL DATA:  Redness/swelling to right leg EXAM: CT OF THE LOWER RIGHT EXTREMITY WITH CONTRAST CT OF THE RIGHT TIBIA AND FIBULA WITH CONTRAST CT OF THE RIGHT FOOT WITH CONTRAST TECHNIQUE: Multidetector CT imaging of the lower right extremity was performed according to the standard protocol following intravenous contrast administration. RADIATION DOSE REDUCTION: This exam was performed according to the departmental dose-optimization program which includes automated exposure control, adjustment of the mA and/or  kV according to patient size and/or use of iterative reconstruction technique. CONTRAST:  OMNIPAQUE IOHEXOL 300 MG/ML  SOLN COMPARISON:  Right lower extremity venous Doppler dated 05/05/2022 FINDINGS: CT of the right lower extremity performed, extending from the level of the lower pelvis through the right foot. Visualized pelvis is notable for moderate pelvic ascites and right inguinal nodes measuring up to 19 mm short axis (series 8/image 99), likely reactive. No evidence of DVT. Visualized osseous structures are within normal limits. No fracture is seen. No cortical destruction to suggest osteomyelitis. No drainable fluid collection/abscess. Mild subcutaneous edema along the anterior upper thigh. Additional subcutaneous edema along the calf, more prominent laterally. Mild subcutaneous edema with skin thickening along the dorsal foot. These findings correspond to the clinical appearance of cellulitis. No intramuscular abnormality. IMPRESSION: Cellulitis along the right lower extremity, as above. No drainable fluid collection/abscess. No evidence of osteomyelitis. Prominent right inguinal nodes, likely reactive. Moderate pelvic ascites, incompletely visualized. Electronically Signed   By: Charline Bills M.D.   On: 05/05/2022 21:15   CT TIBIA FIBULA RIGHT W CONTRAST  Result Date: 05/05/2022 CLINICAL DATA:  Redness/swelling to right leg EXAM: CT OF THE LOWER RIGHT EXTREMITY WITH CONTRAST CT OF THE RIGHT TIBIA AND FIBULA WITH CONTRAST CT OF THE RIGHT FOOT WITH CONTRAST TECHNIQUE: Multidetector CT imaging of the lower right extremity was performed according to the standard protocol following intravenous contrast administration. RADIATION DOSE REDUCTION: This exam was performed according to the departmental dose-optimization program which includes automated exposure control, adjustment of the mA and/or kV according to patient size and/or use of iterative reconstruction technique. CONTRAST:  OMNIPAQUE  IOHEXOL 300 MG/ML  SOLN COMPARISON:  Right lower extremity venous Doppler dated 05/05/2022 FINDINGS: CT of the right lower extremity performed, extending from the level of the lower pelvis through the right foot. Visualized pelvis is notable for moderate pelvic ascites and right inguinal nodes measuring up to 19 mm short axis (series 8/image 99), likely reactive. No evidence of DVT. Visualized osseous structures are within normal limits. No fracture is seen. No cortical destruction to suggest osteomyelitis. No drainable fluid collection/abscess. Mild subcutaneous edema along the anterior upper thigh. Additional subcutaneous edema along the calf, more prominent laterally. Mild subcutaneous edema with skin thickening along the dorsal foot. These findings correspond to the clinical appearance of cellulitis. No intramuscular abnormality. IMPRESSION: Cellulitis along the right lower extremity, as above. No drainable fluid collection/abscess. No evidence of osteomyelitis. Prominent right inguinal nodes, likely reactive. Moderate pelvic ascites, incompletely visualized. Electronically Signed   By: Charline Bills M.D.   On: 05/05/2022 21:15   CT FOOT RIGHT W CONTRAST  Result Date: 05/05/2022 CLINICAL DATA:  Redness/swelling to right leg EXAM: CT OF THE LOWER RIGHT EXTREMITY WITH CONTRAST CT OF THE RIGHT TIBIA AND FIBULA WITH CONTRAST CT OF THE RIGHT FOOT WITH CONTRAST TECHNIQUE: Multidetector CT imaging of the lower right extremity was performed according to the standard protocol following intravenous contrast administration. RADIATION DOSE REDUCTION: This exam was performed according  to the departmental dose-optimization program which includes automated exposure control, adjustment of the mA and/or kV according to patient size and/or use of iterative reconstruction technique. CONTRAST:  OMNIPAQUE IOHEXOL 300 MG/ML  SOLN COMPARISON:  Right lower extremity venous Doppler dated 05/05/2022 FINDINGS: CT of the  right lower extremity performed, extending from the level of the lower pelvis through the right foot. Visualized pelvis is notable for moderate pelvic ascites and right inguinal nodes measuring up to 19 mm short axis (series 8/image 99), likely reactive. No evidence of DVT. Visualized osseous structures are within normal limits. No fracture is seen. No cortical destruction to suggest osteomyelitis. No drainable fluid collection/abscess. Mild subcutaneous edema along the anterior upper thigh. Additional subcutaneous edema along the calf, more prominent laterally. Mild subcutaneous edema with skin thickening along the dorsal foot. These findings correspond to the clinical appearance of cellulitis. No intramuscular abnormality. IMPRESSION: Cellulitis along the right lower extremity, as above. No drainable fluid collection/abscess. No evidence of osteomyelitis. Prominent right inguinal nodes, likely reactive. Moderate pelvic ascites, incompletely visualized. Electronically Signed   By: Charline Bills M.D.   On: 05/05/2022 21:15   US Venous Img Lower Unilateral Right  Result Date: 05/05/2022 CLINICAL DATA:  RLE pain, swelling, erythema EXAM: RIGHT LOWER EXTREMITY VENOUS DOPPLER ULTRASOUND TECHNIQUE: Gray-scale sonography with compression, as well as color and duplex ultrasound, were performed to evaluate the deep venous system(s) from the level of the common femoral vein through the popliteal and proximal calf veins. COMPARISON:  None Available. FINDINGS: VENOUS Normal compressibility of the common femoral, superficial femoral, and popliteal veins, as well as the visualized calf veins. Visualized portions of profunda femoral vein and great saphenous vein unremarkable. No filling defects to suggest DVT on grayscale or color Doppler imaging. Doppler waveforms show normal direction of venous flow, normal respiratory plasticity and response to augmentation. Limited views of the contralateral common femoral vein are  unremarkable. Other: Incidental lymph node identified in the right groin, which measures 4 x 1.4 cm. This node has a fatty hilum. IMPRESSION: No evidence of DVT in the right lower extremity. Electronically Signed   By: Feliberto Harts M.D.   On: 05/05/2022 18:12    Assessment and Plan Marco Spears is a 46 y.o. male with a past history of alcoholic cirrhosis, pancytopenia who comes ED complaining of pain, swelling, redness of the right leg that started this morning.  Constant, worsening.  Denies trauma.  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  Right leg cellulitis/sepsis right knee septic bursitis -- patient came in with severe right knee pain acute onset 3 to 4 days, tachycardia, tachypnea, elevated lactic acid, right knee erythema -- on IV vancomycin and Rocephin -- orthopedic consultation with Dr. Martha Clan -- MRI right kneeDiffuse and marked subcutaneous soft tissue swelling/edema and skin thickening consistent with cellulitis. 2. Small focal fluid collection in the region of the prepatellar bursa could reflect bursitis. Could not exclude septic bursitis. 3. No findings suspicious for septic arthritis or osteomyelitis. 4. Very small knee joint effusion and 1 small air collection possibly recent joint aspiration. 5. Mild/early myositis but no findings for pyomyositis. 6. Incidental medial meniscus tear -- patient is status post irrigation and drainage of right knee prepatellar bursitis -- surgical Deep wound culture spending -- infectious disease consultation with Dr. Joylene Draft  Chronic of cirrhosis of liver alcoholic/history of hepatitis C Pancytopenia bilateral lower extremity edema--severe -- resume spironolactone and will give IV Lasix -- lower  extremity ultrasound negative for DVT -- continue lactulose -- ammonia within normal limit  polysubstance drug abuse failure to thrive Homeless -- urine drug screen positive for cannabinoid and  amphetamine -- serum ethanol negative -- patient denies IV drug use    Procedures: right knee I and D prepatellar bursitis Family communication : none Consults : orthopedic CODE STATUS: full DVT Prophylaxis : SCD Level of care: Med-Surg Status is: Inpatient Remains inpatient appropriate because: right knee infection. ID consult, PT/OT    TOTAL TIME TAKING CARE OF THIS PATIENT: 35 minutes.  >50% time spent on counselling and coordination of care  Note: This dictation was prepared with Dragon dictation along with smaller phrase technology. Any transcriptional errors that result from this process are unintentional.  Enedina Finner M.D    [facility]   CC: Primary care physician; Pcp, No

## 2022-05-07 NOTE — Transfer of Care (Signed)
Immediate Anesthesia Transfer of Care Note  Patient: Marco Spears  Procedure(s) Performed: INCISION AND DRAINAGE (Right: Knee)  Patient Location: PACU  Anesthesia Type:General  Level of Consciousness: drowsy  Airway & Oxygen Therapy: Patient Spontanous Breathing and Patient connected to face mask oxygen  Post-op Assessment: Report given to RN and Post -op Vital signs reviewed and stable  Post vital signs: Reviewed and stable  Last Vitals:  Vitals Value Taken Time  BP 88/60 05/07/22 1145  Temp    Pulse 81 05/07/22 1147  Resp 18 05/07/22 1147  SpO2 94 % 05/07/22 1147  Vitals shown include unvalidated device data.  Last Pain:  Vitals:   05/07/22 1006  TempSrc: Oral  PainSc: 8       Patients Stated Pain Goal: 0 (05/06/22 2034)  Complications: No notable events documented.

## 2022-05-07 NOTE — Progress Notes (Signed)
Subjective:  I have reviewed the patient's MRI.  There is no evidence of a septic right knee joint, but there is a fluid collection in the pre-patella bursa.  Given his tenderness and erythema in this area on exam, I suspect that this is pre-patellar septic bursitis.   Objective:   VITALS:   Vitals:   05/06/22 1519 05/06/22 1949 05/07/22 0444 05/07/22 0840  BP: 119/79 115/69 115/67 114/70  Pulse: 86 93 94 88  Resp: 18 19 20 18   Temp: 98.6 F (37 C) (!) 97.5 F (36.4 C) 98.3 F (36.8 C) 99.6 F (37.6 C)  TempSrc:    Oral  SpO2: 98% 99% 94% 94%  Weight:      Height:        PHYSICAL EXAM: Right lower extremity: Patient has diffuse edema.  There is a large erythematous area over the anterior right thigh.  Patient also has erythema over the anterior right knee.  Patient has numerous sores over his bilateral lower legs in different stages of healing.  Patient is unsure of the etiology of these lesions.  There is no active drainage seen from these lesions.  Patient has soft compartments and his lower legs and right thigh.  He can flex his knee to approximately 70 to 80 degrees with passive assistance and can extend his knee to approximately -10 to 20 degrees.  Patient has point tenderness over the anterior and lateral knee.  Distally he is neurovascular intact.  Patient had tenderness over the anterior knee on exam in the infrapatellar area.    LABS  Results for orders placed or performed during the hospital encounter of 05/05/22 (from the past 24 hour(s))  Sedimentation rate     Status: Abnormal   Collection Time: 05/06/22  2:36 PM  Result Value Ref Range   Sed Rate 17 (H) 0 - 15 mm/hr  Body fluid culture w Gram Stain     Status: None (Preliminary result)   Collection Time: 05/06/22  7:30 PM   Specimen: KNEE; Body Fluid  Result Value Ref Range   Specimen Description      KNEE RIGHT Performed at [facility], [address], [place], [place]    Special Requests       Normal Performed at [facility], [address] [place], [place]    Gram Stain      NO SQUAMOUS EPITHELIAL CELLS SEEN NO WBC SEEN NO ORGANISMS SEEN Performed at [facility], [address] [place], [place]    Culture PENDING    Report Status PENDING   Synovial cell count + diff, w/ crystals     Status: Abnormal   Collection Time: 05/06/22  7:30 PM  Result Value Ref Range   Color, Synovial RED (A) YELLOW   Appearance-Synovial CLOUDY (A) CLEAR   Crystals, Fluid NO CRYSTALS SEEN    WBC, Synovial 114 0 - 200 /cu mm   Neutrophil, Synovial 60 (H) 0 - 25 %   Lymphocytes-Synovial Fld 18 0 - 20 %   Monocyte-Macrophage-Synovial Fluid 9 (L) 50 - 90 %   Eosinophils-Synovial 12 (H) 0 - 1 %   Other Cells-SYN 1   Creatinine, serum     Status: None   Collection Time: 05/07/22  4:01 AM  Result Value Ref Range   Creatinine, Ser 0.79 0.61 - 1.24 mg/dL   GFR, Estimated 05/09/22 >30 mL/min    MR KNEE RIGHT WO CONTRAST  Result Date: 05/07/2022 CLINICAL  DATA:  Swelling/inflammation. EXAM: MRI OF THE RIGHT KNEE WITHOUT CONTRAST TECHNIQUE: Multiplanar, multisequence MR imaging of the knee was performed. No intravenous contrast was administered. COMPARISON:  CT scan 05/05/2022 FINDINGS: Diffuse and marked subcutaneous soft tissue swelling/edema and skin thickening consistent with cellulitis. Small focal fluid collection measuring 2 cm in the region of the prepatellar bursa could reflect bursitis. Could not exclude septic bursitis. Very small joint effusion with a small amount of air likely from a joint aspiration. Recommend clinical correlation. No findings suspicious for septic arthritis or osteomyelitis. Mild edema like signal changes in the anterior and posterior compartment musculature suggesting mild/early myositis but no findings for pyomyositis. Mild knee joint degenerative changes with degenerative chondrosis. Posterior horn medial meniscus tears noted. The knee  ligaments are intact. IMPRESSION: 1. Diffuse and marked subcutaneous soft tissue swelling/edema and skin thickening consistent with cellulitis. 2. Small focal fluid collection in the region of the prepatellar bursa could reflect bursitis. Could not exclude septic bursitis. 3. No findings suspicious for septic arthritis or osteomyelitis. 4. Very small knee joint effusion and 1 small air collection possibly recent joint aspiration. 5. Mild/early myositis but no findings for pyomyositis. 6. Incidental medial meniscus tear Electronically Signed   By: Rudie Meyer M.D.   On: 05/07/2022 07:18   CT FEMUR RIGHT W CONTRAST  Result Date: 05/05/2022 CLINICAL DATA:  Redness/swelling to right leg EXAM: CT OF THE LOWER RIGHT EXTREMITY WITH CONTRAST CT OF THE RIGHT TIBIA AND FIBULA WITH CONTRAST CT OF THE RIGHT FOOT WITH CONTRAST TECHNIQUE: Multidetector CT imaging of the lower right extremity was performed according to the standard protocol following intravenous contrast administration. RADIATION DOSE REDUCTION: This exam was performed according to the departmental dose-optimization program which includes automated exposure control, adjustment of the mA and/or kV according to patient size and/or use of iterative reconstruction technique. CONTRAST:  OMNIPAQUE IOHEXOL 300 MG/ML  SOLN COMPARISON:  Right lower extremity venous Doppler dated 05/05/2022 FINDINGS: CT of the right lower extremity performed, extending from the level of the lower pelvis through the right foot. Visualized pelvis is notable for moderate pelvic ascites and right inguinal nodes measuring up to 19 mm short axis (series 8/image 99), likely reactive. No evidence of DVT. Visualized osseous structures are within normal limits. No fracture is seen. No cortical destruction to suggest osteomyelitis. No drainable fluid collection/abscess. Mild subcutaneous edema along the anterior upper thigh. Additional subcutaneous edema along the calf, more prominent  laterally. Mild subcutaneous edema with skin thickening along the dorsal foot. These findings correspond to the clinical appearance of cellulitis. No intramuscular abnormality. IMPRESSION: Cellulitis along the right lower extremity, as above. No drainable fluid collection/abscess. No evidence of osteomyelitis. Prominent right inguinal nodes, likely reactive. Moderate pelvic ascites, incompletely visualized. Electronically Signed   By: Charline Bills M.D.   On: 05/05/2022 21:15   CT TIBIA FIBULA RIGHT W CONTRAST  Result Date: 05/05/2022 CLINICAL DATA:  Redness/swelling to right leg EXAM: CT OF THE LOWER RIGHT EXTREMITY WITH CONTRAST CT OF THE RIGHT TIBIA AND FIBULA WITH CONTRAST CT OF THE RIGHT FOOT WITH CONTRAST TECHNIQUE: Multidetector CT imaging of the lower right extremity was performed according to the standard protocol following intravenous contrast administration. RADIATION DOSE REDUCTION: This exam was performed according to the departmental dose-optimization program which includes automated exposure control, adjustment of the mA and/or kV according to patient size and/or use of iterative reconstruction technique. CONTRAST:  OMNIPAQUE IOHEXOL 300 MG/ML  SOLN COMPARISON:  Right lower extremity venous Doppler dated  05/05/2022 FINDINGS: CT of the right lower extremity performed, extending from the level of the lower pelvis through the right foot. Visualized pelvis is notable for moderate pelvic ascites and right inguinal nodes measuring up to 19 mm short axis (series 8/image 99), likely reactive. No evidence of DVT. Visualized osseous structures are within normal limits. No fracture is seen. No cortical destruction to suggest osteomyelitis. No drainable fluid collection/abscess. Mild subcutaneous edema along the anterior upper thigh. Additional subcutaneous edema along the calf, more prominent laterally. Mild subcutaneous edema with skin thickening along the dorsal foot. These findings correspond to  the clinical appearance of cellulitis. No intramuscular abnormality. IMPRESSION: Cellulitis along the right lower extremity, as above. No drainable fluid collection/abscess. No evidence of osteomyelitis. Prominent right inguinal nodes, likely reactive. Moderate pelvic ascites, incompletely visualized. Electronically Signed   By: Charline Bills M.D.   On: 05/05/2022 21:15   CT FOOT RIGHT W CONTRAST  Result Date: 05/05/2022 CLINICAL DATA:  Redness/swelling to right leg EXAM: CT OF THE LOWER RIGHT EXTREMITY WITH CONTRAST CT OF THE RIGHT TIBIA AND FIBULA WITH CONTRAST CT OF THE RIGHT FOOT WITH CONTRAST TECHNIQUE: Multidetector CT imaging of the lower right extremity was performed according to the standard protocol following intravenous contrast administration. RADIATION DOSE REDUCTION: This exam was performed according to the departmental dose-optimization program which includes automated exposure control, adjustment of the mA and/or kV according to patient size and/or use of iterative reconstruction technique. CONTRAST:  OMNIPAQUE IOHEXOL 300 MG/ML  SOLN COMPARISON:  Right lower extremity venous Doppler dated 05/05/2022 FINDINGS: CT of the right lower extremity performed, extending from the level of the lower pelvis through the right foot. Visualized pelvis is notable for moderate pelvic ascites and right inguinal nodes measuring up to 19 mm short axis (series 8/image 99), likely reactive. No evidence of DVT. Visualized osseous structures are within normal limits. No fracture is seen. No cortical destruction to suggest osteomyelitis. No drainable fluid collection/abscess. Mild subcutaneous edema along the anterior upper thigh. Additional subcutaneous edema along the calf, more prominent laterally. Mild subcutaneous edema with skin thickening along the dorsal foot. These findings correspond to the clinical appearance of cellulitis. No intramuscular abnormality. IMPRESSION: Cellulitis along the right lower  extremity, as above. No drainable fluid collection/abscess. No evidence of osteomyelitis. Prominent right inguinal nodes, likely reactive. Moderate pelvic ascites, incompletely visualized. Electronically Signed   By: Charline Bills M.D.   On: 05/05/2022 21:15   US Venous Img Lower Unilateral Right  Result Date: 05/05/2022 CLINICAL DATA:  RLE pain, swelling, erythema EXAM: RIGHT LOWER EXTREMITY VENOUS DOPPLER ULTRASOUND TECHNIQUE: Gray-scale sonography with compression, as well as color and duplex ultrasound, were performed to evaluate the deep venous system(s) from the level of the common femoral vein through the popliteal and proximal calf veins. COMPARISON:  None Available. FINDINGS: VENOUS Normal compressibility of the common femoral, superficial femoral, and popliteal veins, as well as the visualized calf veins. Visualized portions of profunda femoral vein and great saphenous vein unremarkable. No filling defects to suggest DVT on grayscale or color Doppler imaging. Doppler waveforms show normal direction of venous flow, normal respiratory plasticity and response to augmentation. Limited views of the contralateral common femoral vein are unremarkable. Other: Incidental lymph node identified in the right groin, which measures 4 x 1.4 cm. This node has a fatty hilum. IMPRESSION: No evidence of DVT in the right lower extremity. Electronically Signed   By: Feliberto Harts M.D.   On: 05/05/2022 18:12    Assessment/Plan:  Day of Surgery   Principal Problem:   Cellulitis Active Problems:   Alcoholic cirrhosis of liver with ascites (HCC)   Hypokalemia   Anemia   Sepsis (HCC)   Cellulitis of right leg   Cirrhosis of liver without ascites (HCC)   Polysubstance abuse (HCC)  I am recommending an I&D of the right pre-patella bursa given the MRI findings and tenderness and erythema in this area on exam.  I have discussed this plan with the patient and he agrees with the plan for surgery.  I have  reviewed the details of the operation and post-op course with the patient.  We also reviewed the risks and benefits of the procedure including persistent or recurrent infection requiring further I&Ds.  Patient has been NPO after midnight.  I have informed Dr. Allena Katz of the surgical plan.  Continue IV antibiotics as ordered.   Juanell Fairly , MD 05/07/2022, 9:35 AM

## 2022-05-07 NOTE — Consult Note (Signed)
NAME: Marco Spears  DOB: 1976/02/21  MRN: [id number]  Date/Time: 05/07/2022 3:27 PM  REQUESTING PROVIDER: Dr.Patel Subjective:  REASON FOR CONSULT: Septic bursitis ? Marco Spears is a 46 y.o. 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 He noted a few hours later that his rt leg was painful, more swollen and erythematous and called EMS In the ED on 05/05/22 vitals were BP 113/62, temp 99.4, Pulse 93, RR 20 WBC 5.5, HB 10.8, Plt 51 and cr 0.66 He did not remember being bit by any insects ticks, He has had chronic wounds both legs as the legs itch. He is homeless since he was released from prison a month ago- He was in for 66yrs in different stints There was scattered patchy erythema rt leg [place] leg r/o DVT Blood culture sent and he was started on vanco/ceftriaxone CT leg showed [place] edema , no abscess and pelvic ascites. MRI knee showed diffuse  edema- Small prepatellar bursa. He as seen by ortho and the rt knee was aspirated with cell count if 114 and no crystals- Septic knee was ruled out and taken to OR today and underwent I/D of the prepatellar bursa rt knee. Cultures were sent I am seeing him for antibiotic management   Past Medical History:  Diagnosis Date   Cirrhosis of liver (HCC)    Heart murmur     History reviewed. No pertinent surgical history.  Social History   Socioeconomic History   Marital status: Single    Spouse name: Not on file   Number of children: Not on file   Years of education: Not on file   Highest education level: Not on file  Occupational History   Not on file  Tobacco Use   Smoking status: Some Days    Types: Cigarettes   Smokeless tobacco: Never  Substance and Sexual Activity    Alcohol use: Never   Drug use: Yes    Types: Marijuana   Sexual activity: Not on file  Other Topics Concern   Not on file  Social History Narrative   Not on file   Social Determinants of Health   Financial Resource Strain: Not on file  Food Insecurity: Not on file  Transportation Needs: Not on file  Physical Activity: Not on file  Stress: Not on file  Social Connections: Not on file  Intimate Partner Violence: Not on file    History reviewed. No pertinent family history. Allergies  Allergen Reactions   Amoxicillin Rash    States it makes his skin turn red   I? Current Facility-Administered Medications  Medication Dose Route Frequency Provider Last Rate Last Admin   acetaminophen (TYLENOL) tablet 650 mg  650 mg Oral Q6H PRN Juanell Fairly, MD       Or   acetaminophen (TYLENOL) suppository 650 mg  650 mg Rectal Q6H PRN Juanell Fairly, MD       B-complex with vitamin C tablet 1 tablet  1 tablet Oral Daily Juanell Fairly, MD   1 tablet at 05/07/22 1400   bisacodyl (DULCOLAX) EC tablet 5 mg  5 mg Oral Daily PRN Juanell Fairly, MD       cefTRIAXone (ROCEPHIN) 2 g in sodium chloride 0.9 %  100 mL IVPB  2 g Intravenous Q24H Thornton Park, MD 200 mL/hr at 05/06/22 1749 2 g at 05/06/22 1749   docusate sodium (COLACE) capsule 100 mg  100 mg Oral BID Thornton Park, MD   100 mg at 05/06/22 1122   ferrous sulfate tablet 325 mg  325 mg Oral Q breakfast Thornton Park, MD   325 mg at [id number]   folic acid (FOLVITE) tablet 1 mg  1 mg Oral Daily Thornton Park, MD   1 mg at 05/07/22 1401   furosemide (LASIX) injection 40 mg  40 mg Intravenous Daily Thornton Park, MD   40 mg at 05/06/22 1446   lactulose (CHRONULAC) 10 GM/15ML solution 30 g  30 g Oral BID Thornton Park, MD   30 g at 05/06/22 1123   morphine (PF) 2 MG/ML injection 2 mg  2 mg Intravenous Q2H PRN Thornton Park, MD   2 mg at 05/06/22 0340   multivitamin with minerals tablet 1 tablet  1 tablet Oral  Daily Thornton Park, MD   1 tablet at 05/07/22 1401   ondansetron (ZOFRAN) tablet 4 mg  4 mg Oral Q6H PRN Thornton Park, MD       Or   ondansetron [facility]) injection 4 mg  4 mg Intravenous Q6H PRN Thornton Park, MD       oxyCODONE (Oxy IR/ROXICODONE) immediate release tablet 5 mg  5 mg Oral Q4H PRN Thornton Park, MD   5 mg at 05/06/22 2034   polyethylene glycol (MIRALAX / GLYCOLAX) packet 17 g  17 g Oral Daily PRN Thornton Park, MD       spironolactone (ALDACTONE) tablet 25 mg  25 mg Oral BID Thornton Park, MD   25 mg at 05/07/22 1401   traMADol (ULTRAM) tablet 50 mg  50 mg Oral QID Fritzi Mandes, MD   50 mg at 05/07/22 1400   vancomycin (VANCOREADY) IVPB 1500 mg/300 mL  1,500 mg Intravenous Q12H Thornton Park, MD 150 mL/hr at 05/07/22 0910 1,500 mg at 05/07/22 0910     Abtx:  Anti-infectives (From admission, onward)    Start     Dose/Rate Route Frequency Ordered Stop   05/07/22 1005  ceFAZolin (ANCEF) 2-4 GM/100ML-% IVPB       Note to Pharmacy: Jordan Hawks H: cabinet override      05/07/22 1005 05/07/22 1103   05/07/22 0935  ceFAZolin (ANCEF) IVPB 2g/100 mL premix        2 g 200 mL/hr over 30 Minutes Intravenous 30 min pre-op 05/07/22 0935 05/07/22 1133   05/06/22 1800  cefTRIAXone (ROCEPHIN) 2 g in sodium chloride 0.9 % 100 mL IVPB        2 g 200 mL/hr over 30 Minutes Intravenous Every 24 hours 05/05/22 2144     05/06/22 1800  vancomycin (VANCOREADY) IVPB 1500 mg/300 mL        1,500 mg 150 mL/hr over 120 Minutes Intravenous Every 12 hours 05/06/22 0846     05/06/22 0600  clindamycin (CLEOCIN) capsule 600 mg  Status:  Discontinued        600 mg Oral Every 8 hours 05/05/22 2144 05/06/22 1419   05/06/22 0100  vancomycin (VANCOREADY) IVPB 1500 mg/300 mL  Status:  Discontinued        1,500 mg 150 mL/hr over 120 Minutes Intravenous Every 8 hours 05/05/22 2208 05/06/22 0846   05/05/22 1615  cefTRIAXone (ROCEPHIN) 2 g in sodium chloride 0.9 % 100 mL IVPB  2  g 200 mL/hr over 30 Minutes Intravenous  Once 05/05/22 1613 05/05/22 1715   05/05/22 1615  vancomycin (VANCOREADY) IVPB 2000 mg/400 mL        2,000 mg 200 mL/hr over 120 Minutes Intravenous  Once 05/05/22 1613 05/05/22 1930       REVIEW OF SYSTEMS:  Const: negative fever, negative chills, 20 pound weight gain he says due to fluid Eyes: negative diplopia or visual changes, negative eye pain ENT: negative coryza, negative sore throat Resp: some  cough, hemoptysis, has dyspnea Cards: negative for chest pain, palpitations, b/l lower extremity edema GU: negative for frequency, dysuria and hematuria GI:  abdominal tightness no diarrhea, bleeding, constipation Skin:healed and excoriated wounds legs Heme:  easy bruising  MS: weakness Neurolo:negative for headaches, dizziness, vertigo, some memory problems  Psych: negative for feelings of anxiety, depression  Endocrine: negative for thyroid, diabetes Allergy/Immunology- amoxicillin makes his skin red Objective:  VITALS:  BP 111/69   Pulse 78   Temp 98.1 F (36.7 C)   Resp 16   Ht 6\' 3"  (1.905 m)   Wt 103.9 kg   SpO2 94%   BMI 28.62 kg/m   PHYSICAL EXAM:  General: Alert, cooperative, slightly tachypnec at rest, disheveled Head: Normocephalic, without obvious abnormality, atraumatic. Eyes: Conjunctivae clear, anicteric sclerae. Pupils are equal ENT Nares normal. No drainage or sinus tenderness. Lips, mucosa, and tongue normal. No Thrush Neck: Supple, symmetrical, no adenopathy, thyroid: non tender no carotid bruit and no JVD.  Lungs: b/l air entry- crepts bases Heart: Tachycardia Abdomen: Soft,  distended.ascites Edema abdominal wall and legs and scrotum Extremities: edema legs Rt > left. Macular erythema over the rt knee and rt thigh Multiple excoriated wounds and healed wounds both legs Rt foot- 3rd toe area sore on touch with cracks in between toes        Skin: as above Lymph: Cervical, supraclavicular  normal. Neurologic: Grossly non-focal Pertinent Labs Lab Results CBC    Component Value Date/Time   WBC 5.5 05/05/2022 1632   RBC 3.05 (L) 05/05/2022 1632   HGB 9.5 (L) 05/07/2022 1005   HGB 16.1 08/23/2012 1434   HCT 28.0 (L) 05/07/2022 1005   HCT 45.4 08/23/2012 1434   PLT 51 (L) 05/05/2022 1632   PLT 133 (L) 08/23/2012 1434   MCV 103.3 (H) 05/05/2022 1632   MCV 93 08/23/2012 1434   MCH 35.4 (H) 05/05/2022 1632   MCHC 34.3 05/05/2022 1632   RDW 17.6 (H) 05/05/2022 1632   RDW 13.4 08/23/2012 1434   LYMPHSABS 0.4 (L) 05/05/2022 1632   LYMPHSABS 1.2 08/23/2012 1434   MONOABS 0.6 05/05/2022 1632   MONOABS 0.7 08/23/2012 1434   EOSABS 0.1 05/05/2022 1632   EOSABS 0.0 08/23/2012 1434   BASOSABS 0.0 05/05/2022 1632   BASOSABS 0.0 08/23/2012 1434       Latest Ref Rng & Units 05/07/2022   10:05 AM 05/07/2022    4:01 AM 05/05/2022    4:32 PM  CMP  Glucose 70 - 99 mg/dL 07/06/2022   027   BUN 6 - 20 mg/dL 6   14   Creatinine 253 - 1.24 mg/dL 6.64  4.03  4.74   Sodium 135 - 145 mmol/L 134   132   Potassium 3.5 - 5.1 mmol/L 3.4   3.3   Chloride 98 - 111 mmol/L 96   100   CO2 22 - 32 mmol/L   27   Calcium 8.9 - 10.3 mg/dL   7.6  Total Protein 6.5 - 8.1 g/dL   5.3   Total Bilirubin 0.3 - 1.2 mg/dL   4.6   Alkaline Phos 38 - 126 U/L   72   AST 15 - 41 U/L   45   ALT 0 - 44 U/L   36       Microbiology: Recent Results (from the past 240 hour(s))  Culture, blood (routine x 2)     Status: None (Preliminary result)   Collection Time: 05/05/22  4:31 PM   Specimen: BLOOD  Result Value Ref Range Status   Specimen Description BLOOD RIGHT ANTECUBITAL  Final   Special Requests   Final    BOTTLES DRAWN AEROBIC AND ANAEROBIC Blood Culture adequate volume   Culture   Final    NO GROWTH 2 DAYS Performed at [facility], [address], [place], [place] [address]    Report Status PENDING  Incomplete  Urine Culture     Status: None   Collection Time: 05/05/22  4:32 PM    Specimen: Urine, Clean Catch  Result Value Ref Range Status   Specimen Description   Final    URINE, CLEAN CATCH Performed at [facility], [address], [place], [place] [address]    Special Requests   Final    NONE Performed at [facility], [address] [place], [place] [address]    Culture   Final    NO GROWTH Performed at [facility], [address] [place], [place] [address]    Report Status 05/06/2022 FINAL  Final  Culture, blood (routine x 2)     Status: None (Preliminary result)   Collection Time: 05/05/22  4:34 PM   Specimen: BLOOD  Result Value Ref Range Status   Specimen Description BLOOD BLOOD LEFT HAND  Final   Special Requests   Final    BOTTLES DRAWN AEROBIC AND ANAEROBIC Blood Culture adequate volume   Culture   Final    NO GROWTH 2 DAYS Performed at [facility], [address], [place], [place] [address]    Report Status PENDING  Incomplete  Body fluid culture w Gram Stain     Status: None (Preliminary result)   Collection Time: 05/06/22  7:30 PM   Specimen: KNEE; Body Fluid  Result Value Ref Range Status   Specimen Description   Final    KNEE RIGHT Performed at [facility], [address], [place], [place] [address]    Special Requests   Final    Normal Performed at [facility], [address] [place], [place] [address]    Gram Stain   Final    NO SQUAMOUS EPITHELIAL CELLS SEEN NO WBC SEEN NO ORGANISMS SEEN Performed at [facility], [address] [place], [place] [address]    Culture PENDING  Incomplete   Report Status PENDING  Incomplete  Surgical pcr screen     Status: Abnormal   Collection Time: 05/07/22  9:17 AM   Specimen: Nasal Mucosa; Nasal Swab  Result Value Ref Range Status   MRSA, PCR NEGATIVE NEGATIVE Final   Staphylococcus aureus POSITIVE (A) NEGATIVE Final    Comment: (NOTE) The Xpert SA Assay (FDA approved for NASAL specimens in patients 20 years of age  and older), is one component of a comprehensive surveillance program. It is not intended to diagnose infection nor to guide or monitor treatment. Performed at [facility], [address], [place], [place] [address]  IMAGING RESULTS: MRI reviewed of rt leg Edema , no abscess  I have personally reviewed the films ? Impression/Recommendation ? 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

## 2022-05-07 NOTE — Anesthesia Procedure Notes (Signed)
Procedure Name: LMA Insertion Date/Time: 05/07/2022 11:00 AM  Performed by: Hezzie Bump, CRNAPre-anesthesia Checklist: Patient identified, Patient being monitored, Timeout performed, Emergency Drugs available and Suction available Patient Re-evaluated:Patient Re-evaluated prior to induction Oxygen Delivery Method: Circle system utilized Preoxygenation: Pre-oxygenation with 100% oxygen Induction Type: IV induction Ventilation: Mask ventilation without difficulty LMA: LMA inserted LMA Size: 4.5 Tube type: Oral Number of attempts: 1 Placement Confirmation: positive ETCO2 and breath sounds checked- equal and bilateral Tube secured with: Tape Dental Injury: Teeth and Oropharynx as per pre-operative assessment

## 2022-05-07 NOTE — Evaluation (Signed)
Physical Therapy Evaluation Patient Details Name: Marco Spears MRN: [id number] DOB: 22-Nov-1975 Today's Date: 05/07/2022  History of Present Illness  Marco Spears is a 46 y.o. male who complains of spontaneous onset of diffuse right lower extremity edema beginning on Monday.  Patient states he woke up with right lower extremity edema with erythema and knee pain.  Pt is s/p incision and drainage of prepatellar bursa of the R knee on 05/07/22.   Clinical Impression  Pt received in supine position and agreeable to therapy.  Pt notes he does not want to get out of the bed due to the pain in the R LE.  Pt educated on current WBAT status and the benefits of mobility of the LE in order to prevent muscular atrophy.  Pt agreed to bed level exercises and put forth good effort, but unable to perform with R LE as noted below.  Pt did attempt to perform all the exercises, just noted that pain was too intense for him to continue with the R LE exercises.  Pt deferred any bed mobility at this time.  Will continue to monitor during hospital stay.  Due to inability to care for himself at this time and no assistance from family members, recommendation to SNF is appropriate at this time.         Recommendations for follow up therapy are one component of a multi-disciplinary discharge planning process, led by the attending physician.  Recommendations may be updated based on patient status, additional functional criteria and insurance authorization.  Follow Up Recommendations Skilled nursing-short term rehab (<3 hours/day) Can patient physically be transported by private vehicle: No    Assistance Recommended at Discharge Frequent or constant Supervision/Assistance  Patient can return home with the following  A lot of help with walking and/or transfers;A lot of help with bathing/dressing/bathroom    Equipment Recommendations Rolling walker (2 wheels)  Recommendations for Other Services       Functional Status  Assessment Patient has had a recent decline in their functional status and demonstrates the ability to make significant improvements in function in a reasonable and predictable amount of time.     Precautions / Restrictions Restrictions Weight Bearing Restrictions: Yes RLE Weight Bearing: Weight bearing as tolerated      Mobility  Bed Mobility               General bed mobility comments: deferred due to pain in the R LE.    Transfers                        Ambulation/Gait                  Stairs            Wheelchair Mobility    Modified Rankin (Stroke Patients Only)       Balance                                             Pertinent Vitals/Pain Pain Assessment Pain Assessment: 0-10 Pain Score: 7  Pain Location: Tip of R Knee Pain Descriptors / Indicators: Tightness, Pressure Pain Intervention(s): Limited activity within patient's tolerance, Monitored during session    Home Living Family/patient expects to be discharged to:: Shelter/Homeless  Prior Function Prior Level of Function : Independent/Modified Independent                     Hand Dominance   Dominant Hand: Right    Extremity/Trunk Assessment        Lower Extremity Assessment Lower Extremity Assessment: RLE deficits/detail RLE Deficits / Details: deficits due to cellulitis and s/p drainage. RLE: Unable to fully assess due to pain       Communication      Cognition Arousal/Alertness: Awake/alert Behavior During Therapy: WFL for tasks assessed/performed Overall Cognitive Status: Within Functional Limits for tasks assessed                                          General Comments General comments (skin integrity, edema, etc.): not able to assess.    Exercises Total Joint Exercises Ankle Circles/Pumps: AROM, Strengthening, Both, 10 reps, Supine Quad Sets: AROM, Strengthening, Left,  10 reps, Right, 5 reps, Supine Gluteal Sets: AROM, Strengthening, Both, 10 reps, Supine Hip ABduction/ADduction: AROM, Strengthening, Left, 10 reps, Supine Straight Leg Raises: AROM, Strengthening, Left, 10 reps, Supine   Assessment/Plan    PT Assessment Patient needs continued PT services  PT Problem List Decreased strength;Decreased range of motion;Decreased activity tolerance;Decreased balance;Decreased mobility;Decreased knowledge of use of DME;Decreased safety awareness       PT Treatment Interventions DME instruction;Gait training;Functional mobility training;Therapeutic activities;Therapeutic exercise;Balance training;Neuromuscular re-education;Patient/family education    PT Goals (Current goals can be found in the Care Plan section)  Acute Rehab PT Goals Patient Stated Goal: for leg to stop hurting. PT Goal Formulation: With patient Time For Goal Achievement: 05/21/22 Potential to Achieve Goals: Good    Frequency Min 2X/week     Co-evaluation               AM-PAC PT "6 Clicks" Mobility  Outcome Measure Help needed turning from your back to your side while in a flat bed without using bedrails?: A Lot Help needed moving from lying on your back to sitting on the side of a flat bed without using bedrails?: A Lot Help needed moving to and from a bed to a chair (including a wheelchair)?: A Lot Help needed standing up from a chair using your arms (e.g., wheelchair or bedside chair)?: A Lot Help needed to walk in hospital room?: A Lot Help needed climbing 3-5 steps with a railing? : A Lot 6 Click Score: 12    End of Session   Activity Tolerance: Patient limited by pain Patient left: in bed;with call bell/phone within reach;with bed alarm set Nurse Communication: Mobility status PT Visit Diagnosis: Unsteadiness on feet (R26.81);Other abnormalities of gait and mobility (R26.89);Muscle weakness (generalized) (M62.81);Difficulty in walking, not elsewhere classified  (R26.2)    Time: 7106-2694 PT Time Calculation (min) (ACUTE ONLY): 32 min   Charges:   PT Evaluation $PT Eval Low Complexity: 1 Low PT Treatments $Therapeutic Exercise: 8-22 mins        Nolon Bussing, PT, DPT 05/07/22, 4:22 PM   Phineas Real 05/07/2022, 4:18 PM

## 2022-05-07 NOTE — Plan of Care (Signed)
  Problem: Clinical Measurements: Goal: Ability to avoid or minimize complications of infection will improve Outcome: Progressing   Problem: Skin Integrity: Goal: Skin integrity will improve Outcome: Progressing   Problem: Education: Goal: Knowledge of General Education information will improve Description: Including pain rating scale, medication(s)/side effects and non-pharmacologic comfort measures Outcome: Progressing   

## 2022-05-07 NOTE — Op Note (Signed)
05/07/2022  11:52 AM  PATIENT:  Marco Spears    PRE-OPERATIVE DIAGNOSIS:  Right knee septic bursitis  POST-OPERATIVE DIAGNOSIS:  Same  PROCEDURE:  INCISION AND DRAINAGE PREPATELLAR BURSA, RIGHT KNEE  SURGEON:  Thornton Park, MD  ANESTHESIA:   General  PREOPERATIVE INDICATIONS:  Marco Spears is a  46 y.o. male with a diagnosis of Right Knee prepatellar septic bursitis who failed conservative measures and elected for surgical management.    I discussed the risks and benefits of surgery. The risks include but are not limited to recurrent or persistent infection, bleeding , nerve or blood vessel injury, joint stiffness or loss of motion, persistent pain, weakness or instability and the need for further surgery, including repeat irrigation and debridement. Medical risks include but are not limited to DVT and pulmonary embolism, myocardial infarction, stroke, pneumonia, respiratory failure and death. Patient understood these risks and wished to proceed.    OPERATIVE FINDINGS: Right septic prepatellar bursitis  OPERATIVE PROCEDURE: Patient was met in the preoperative area.  The right knee was marked according to hospital's correct site of surgery protocol.  Patient brought to the operating room was placed supine on the operative table.  Patient underwent general anesthesia with LMA.  She was prepped and draped in a sterile fashion after tourniquet was applied to the right upper thigh.  A timeout was performed to verify the patient's name, date of birth, medical record number, correct site of surgery and correct procedure to be performed.  Once all in attendance were in agreement the case began.  Patient received 2 g of Ancef IV prior to the incision.  Patient had already been on vancomycin and ceftriaxone IV on the floor.  Patient's right thigh erythema had improved since last night during my examination.  He still had erythema over the anterior knee with a small area of fluctuance over  the patella tendon.  Patient's passive range of motion was full extension to approximate 120 degrees of flexion.  His thigh and leg compartments are soft and compressible.  His right lower extremity was well-perfused.  A midline incision was made over the patella tendon.  All bleeding vessels were cauterized during exposure.  Full-thickness skin flaps were developed.  Cloudy purulent fluid was observed draining from the prepatellar bursa consistent with septic bursitis.  Culture swabs were taken of this cloudy fluid and sent to the lab for Gram stain and culture.  Tissue samples of the prepatellar bursa which was thickened with scar tissue were also taken and sent to the lab for analysis.  Adhesions in the prepatellar bursa were released.  Patient then had the prepatellar bursa copiously irrigated with 2 L of GU infused saline.  Patient's skin was closed with interrupted 4-0 nylon sutures.  A dry sterile dressing was applied.  The patient was woken brought to the PACU in stable condition.  I was scrubbed and present for the entire case and all sharp, sponge and instrument counts were correct at the conclusion of the case.

## 2022-05-08 ENCOUNTER — Encounter: Payer: Self-pay | Admitting: Orthopedic Surgery

## 2022-05-08 DIAGNOSIS — R188 Other ascites: Secondary | ICD-10-CM

## 2022-05-08 DIAGNOSIS — M7110 Other infective bursitis, unspecified site: Secondary | ICD-10-CM

## 2022-05-08 MED ORDER — MORPHINE SULFATE (PF) 2 MG/ML IV SOLN
2.0000 mg | INTRAVENOUS | Status: DC | PRN
  Administered 2022-05-08 (×2): 2 mg via INTRAVENOUS
  Filled 2022-05-08 (×2): qty 1

## 2022-05-08 MED ORDER — PANTOPRAZOLE SODIUM 40 MG IV SOLR
40.0000 mg | Freq: Two times a day (BID) | INTRAVENOUS | Status: DC
  Administered 2022-05-08 – 2022-05-09 (×2): 40 mg via INTRAVENOUS
  Filled 2022-05-08 (×2): qty 10

## 2022-05-08 MED ORDER — FUROSEMIDE 10 MG/ML IJ SOLN: 40.0000 mg | Freq: Two times a day (BID) | INTRAMUSCULAR | Status: DC

## 2022-05-08 MED ORDER — SPIRONOLACTONE 25 MG PO TABS
50.0000 mg | ORAL_TABLET | Freq: Two times a day (BID) | ORAL | Status: DC
  Administered 2022-05-08 – 2022-05-13 (×10): 50 mg via ORAL
  Filled 2022-05-08 (×10): qty 2

## 2022-05-08 MED ORDER — APIXABAN 2.5 MG PO TABS
2.5000 mg | ORAL_TABLET | Freq: Two times a day (BID) | ORAL | Status: DC
  Administered 2022-05-08 – 2022-05-13 (×11): 2.5 mg via ORAL
  Filled 2022-05-08 (×11): qty 1

## 2022-05-08 MED ORDER — PROMETHAZINE HCL 25 MG/ML IJ SOLN
25.0000 mg | Freq: Once | INTRAMUSCULAR | Status: AC
  Administered 2022-05-08: 25 mg via INTRAVENOUS
  Filled 2022-05-08: qty 1

## 2022-05-08 MED ORDER — FUROSEMIDE 10 MG/ML IJ SOLN
40.0000 mg | Freq: Three times a day (TID) | INTRAMUSCULAR | Status: DC
  Administered 2022-05-08 – 2022-05-12 (×13): 40 mg via INTRAVENOUS
  Filled 2022-05-08 (×13): qty 4

## 2022-05-08 MED ORDER — METOCLOPRAMIDE HCL 5 MG/ML IJ SOLN
10.0000 mg | Freq: Three times a day (TID) | INTRAMUSCULAR | Status: DC | PRN
  Administered 2022-05-08: 10 mg via INTRAVENOUS
  Filled 2022-05-08: qty 2

## 2022-05-08 NOTE — Progress Notes (Signed)
Patient reports new onset of abd pain with n/v 9/10.  Zofran given with inefeective result.  MD notified. New orders given. Will continue to monitor.,

## 2022-05-08 NOTE — Progress Notes (Signed)
[facility]  - [place] at [facility]   PATIENT NAME: Marco Spears    MR#:  [id number]  DATE OF BIRTH:  1976/01/28  SUBJECTIVE:   patient came in with significant pain right knee acute on server 423 days with ongoing chronic bilateral lower extremity swelling. [place] and has been doing yard work to make ends meet. Got out of jail three weeks ago. Patient said he was in the jail for 20+ years. Patient denies any IV drug use.  Patient is status post right knee incision and drainage.   VITALS:  Blood pressure 116/66, pulse 78, temperature 98.3 F (36.8 C), temperature source Oral, resp. rate 18, height 6\' 3"  (1.905 m), weight 103.9 kg, SpO2 96 %.  PHYSICAL EXAMINATION:   GENERAL:  46 y.o.-year-old patient lying in the bed with no acute distress. Generalized Anasarca LUNGS: Normal breath sounds bilaterally CARDIOVASCULAR: S1, S2 normal. No murmurs, rubs, or gallops.  ABDOMEN: Soft, nontender, nondistended. Bowel sounds present. Abd wall edema+ ON Admisison  05/08/2022    Bilateral leg edema. Multiple tattoo+ in UE NEUROLOGIC: nonfocal  patient is alert and awake   LABORATORY PANEL:  CBC Recent Labs  Lab 05/05/22 1632 05/07/22 1005  WBC 5.5  --   HGB 10.8* 9.5*  HCT 31.5* 28.0*  PLT 51*  --      Chemistries  Recent Labs  Lab 05/05/22 1632 05/07/22 0401 05/07/22 1005  NA 132*  --  134*  K 3.3*  --  3.4*  CL 100  --  96*  CO2 27  --   --   GLUCOSE 117*  --  109*  BUN 14  --  6  CREATININE 0.66   < > 0.60*  CALCIUM 7.6*  --   --   AST 45*  --   --   ALT 36  --   --   ALKPHOS 72  --   --   BILITOT 4.6*  --   --    < > = values in this interval not displayed.    Cardiac Enzymes No results for input(s): "TROPONINI" in the last 168 hours. RADIOLOGY:  MR KNEE RIGHT WO CONTRAST  Result Date: 05/07/2022 CLINICAL DATA:  Swelling/inflammation. EXAM: MRI OF THE RIGHT KNEE WITHOUT CONTRAST TECHNIQUE: Multiplanar, multisequence MR  imaging of the knee was performed. No intravenous contrast was administered. COMPARISON:  CT scan 05/05/2022 FINDINGS: Diffuse and marked subcutaneous soft tissue swelling/edema and skin thickening consistent with cellulitis. Small focal fluid collection measuring 2 cm in the region of the prepatellar bursa could reflect bursitis. Could not exclude septic bursitis. Very small joint effusion with a small amount of air likely from a joint aspiration. Recommend clinical correlation. No findings suspicious for septic arthritis or osteomyelitis. Mild edema like signal changes in the anterior and posterior compartment musculature suggesting mild/early myositis but no findings for pyomyositis. Mild knee joint degenerative changes with degenerative chondrosis. Posterior horn medial meniscus tears noted. The knee ligaments are intact. IMPRESSION: 1. Diffuse and marked subcutaneous soft tissue swelling/edema and skin thickening consistent with cellulitis. 2. Small focal fluid collection in the region of the prepatellar bursa could reflect bursitis. Could not exclude septic bursitis. 3. No findings suspicious for septic arthritis or osteomyelitis. 4. Very small knee joint effusion and 1 small air collection possibly recent joint aspiration. 5. Mild/early myositis but no findings for pyomyositis. 6. Incidental medial meniscus tear Electronically Signed   By: 07/06/2022 M.D.   On: 05/07/2022  07:18    Assessment and Plan Marco Spears is a 46 y.o. male with a past history of alcoholic cirrhosis, pancytopenia who comes ED complaining of pain, swelling, redness of the right leg that started this morning.  Constant, worsening.  Denies trauma.  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  Right leg cellulitis/sepsis right knee septic bursitis -- patient came in with severe right knee pain acute onset 3 to 4 days, tachycardia, tachypnea, elevated lactic acid, right knee erythema -- on IV  vancomycin and Rocephin -- orthopedic consultation with Dr. Martha Clan -- MRI right kneeDiffuse and marked subcutaneous soft tissue swelling/edema and skin thickening consistent with cellulitis. 2. Small focal fluid collection in the region of the prepatellar bursa could reflect bursitis. Could not exclude septic bursitis. 3. No findings suspicious for septic arthritis or osteomyelitis. 4. Very small knee joint effusion and 1 small air collection possibly recent joint aspiration. 5. Mild/early myositis but no findings for pyomyositis. 6. Incidental medial meniscus tear -- patient is status post irrigation and drainage of right knee prepatellar bursitis -- surgical Deep wound culture spending -- infectious disease consultation with Dr. Rolland Bimler to IV cefazolin --Operative Wound/synovial fluid cultures no growth  Chronic of cirrhosis of liver alcoholic/history of hepatitis C Pancytopenia bilateral lower extremity edema--severe Anasarca -- resume spironolactone and will give IV Lasix--increased dose -- lower extremity ultrasound negative for DVT -- continue lactulose -- ammonia within normal limit  polysubstance drug abuse failure to thrive Homeless -- urine drug screen positive for cannabinoid and amphetamine -- serum ethanol negative -- patient denies IV drug use    Procedures: right knee I and D prepatellar bursitis Family communication : none Consults : orthopedic CODE STATUS: full DVT Prophylaxis : SCD Level of care: Med-Surg Status is: Inpatient Remains inpatient appropriate because: right knee infection. ID consult, PT/OT    TOTAL TIME TAKING CARE OF THIS PATIENT: 35 minutes.  >50% time spent on counselling and coordination of care  Note: This dictation was prepared with Dragon dictation along with smaller phrase technology. Any transcriptional errors that result from this process are unintentional.  Enedina Finner M.D    [facility]    CC: Primary care physician; Pcp, No

## 2022-05-08 NOTE — Progress Notes (Addendum)
Subjective:  POD #1 s/p I&D right knee pre-patella bursa.   Patient reports right pain as mild.    Objective:   VITALS:   Vitals:   05/07/22 1527 05/07/22 1932 05/08/22 0327 05/08/22 0821  BP: 122/69 116/76 118/72 116/66  Pulse: 79 82 78 78  Resp: 18 16 16 18   Temp: 98.9 F (37.2 C) 98 F (36.7 C) 98 F (36.7 C) 98.3 F (36.8 C)  TempSrc: Oral Oral  Oral  SpO2: 97% 95% 99% 96%  Weight:      Height:        PHYSICAL EXAM: Right knee:  Erythema and swelling modestly improved.  Neurovascular intact Sensation intact distally Intact pulses distally Dorsiflexion/Plantar flexion intact Incision: no drainage No cellulitis present Compartment soft  LABS  Results for orders placed or performed during the hospital encounter of 05/05/22 (from the past 24 hour(s))  Aerobic Culture w Gram Stain (superficial specimen)     Status: None (Preliminary result)   Collection Time: 05/07/22  9:18 PM   Specimen: KNEE; Wound  Result Value Ref Range   Specimen Description      KNEE RIGHT Performed at [facility], [address] [place], [place]    Special Requests      NONE Performed at [facility], [address], [place], [place]    Gram Stain      NO SQUAMOUS EPITHELIAL CELLS SEEN NO WBC SEEN NO ORGANISMS SEEN    Culture      NO GROWTH < 24 HOURS Performed at [facility], [address], [place], [place]    Report Status PENDING     MR KNEE RIGHT WO CONTRAST  Result Date: 05/07/2022 CLINICAL DATA:  Swelling/inflammation. EXAM: MRI OF THE RIGHT KNEE WITHOUT CONTRAST TECHNIQUE: Multiplanar, multisequence MR imaging of the knee was performed. No intravenous contrast was administered. COMPARISON:  CT scan 05/05/2022 FINDINGS: Diffuse and marked subcutaneous soft tissue swelling/edema and skin thickening consistent with cellulitis. Small focal fluid collection measuring 2 cm in the region of the prepatellar bursa could  reflect bursitis. Could not exclude septic bursitis. Very small joint effusion with a small amount of air likely from a joint aspiration. Recommend clinical correlation. No findings suspicious for septic arthritis or osteomyelitis. Mild edema like signal changes in the anterior and posterior compartment musculature suggesting mild/early myositis but no findings for pyomyositis. Mild knee joint degenerative changes with degenerative chondrosis. Posterior horn medial meniscus tears noted. The knee ligaments are intact. IMPRESSION: 1. Diffuse and marked subcutaneous soft tissue swelling/edema and skin thickening consistent with cellulitis. 2. Small focal fluid collection in the region of the prepatellar bursa could reflect bursitis. Could not exclude septic bursitis. 3. No findings suspicious for septic arthritis or osteomyelitis. 4. Very small knee joint effusion and 1 small air collection possibly recent joint aspiration. 5. Mild/early myositis but no findings for pyomyositis. 6. Incidental medial meniscus tear Electronically Signed   By: 07/06/2022 M.D.   On: 05/07/2022 07:18    Assessment/Plan: 1 Day Post-Op   Principal Problem:   Cellulitis Active Problems:   Alcoholic cirrhosis of liver with ascites (HCC)   Hypokalemia   Anemia   Sepsis (HCC)   Cellulitis of right leg   Cirrhosis of liver without ascites (HCC)   Polysubstance abuse (HCC)  Patient stable post-op.  Continue antibiotics per ID.  No growth on surgical cultures or knee aspiration so far.  May be discharged from an orthopaedic standpoint  on oral antibiotics per ID once cleared medically.   Follow up in clinic in 1-2 weeks for suture removal.  Can begin anticoagulation therapy today for DVT prophylaxis if medically appropriate.    Juanell Fairly , MD 05/08/2022, 1:40 PM

## 2022-05-08 NOTE — TOC Progression Note (Signed)
Transition of Care [facility]) - Progression Note    Patient Details  Name: Marco Spears MRN: [id number] Date of Birth: 03-12-1976  Transition of Care [facility]) CM/SW Contact  Margarito Liner, LCSW Phone Number: 05/08/2022, 11:09 AM  Clinical Narrative: PT recommending SNF. With patient's age, no insurance/PCP, homeless, just released from jail it is unlikely we will be able to get an LOG approved for him and find a SNF able to accept him. MD and PT are aware. Therapy will see him as much as possible to get him to a point safe enough to discharge.    Expected Discharge Plan and Services                                                 Social Determinants of Health (SDOH) Interventions    Readmission Risk Interventions    05/06/2022    2:26 PM  Readmission Risk Prevention Plan  Transportation Screening Complete  Medication Review [employer]) Complete

## 2022-05-08 NOTE — Progress Notes (Signed)
ID Pt seen with hospitalist  C/o vomiting Epigastric pain Abdomen fullness Pain rt leg slightly better But has so much swelling  O/e some distress at rest, pale Patient Vitals for the past 24 hrs:  BP Temp Temp src Pulse Resp SpO2  05/08/22 1551 129/75 97.8 F (36.6 C) -- 70 18 100 %  05/08/22 0821 116/66 98.3 F (36.8 C) Oral 78 18 96 %  05/08/22 0327 118/72 98 F (36.7 C) -- 78 16 99 %  05/07/22 1932 116/76 98 F (36.7 C) Oral 82 16 95 %   Chest b/l air enry Decreased bases Hss1s2 Abd - tenderness epigastrium Edema abdominal wall Edema both legs Multiple excoriations legs Erythema over the rt knee and rt thigh- patchy    Improving swelling and erythema Labs    Latest Ref Rng & Units 05/07/2022   10:05 AM 05/05/2022    4:32 PM 04/22/2022    6:10 AM  CBC  WBC 4.0 - 10.5 K/uL  5.5  2.4   Hemoglobin 13.0 - 17.0 g/dL 9.5  02.4  9.7   Hematocrit 39.0 - 52.0 % 28.0  31.5  28.7   Platelets 150 - 400 K/uL  51  49         Latest Ref Rng & Units 05/07/2022   10:05 AM 05/07/2022    4:01 AM 05/05/2022    4:32 PM  CMP  Glucose 70 - 99 mg/dL 097   353   BUN 6 - 20 mg/dL 6   14   Creatinine 2.99 - 1.24 mg/dL 2.42  6.83  4.19   Sodium 135 - 145 mmol/L 134   132   Potassium 3.5 - 5.1 mmol/L 3.4   3.3   Chloride 98 - 111 mmol/L 96   100   CO2 22 - 32 mmol/L   27   Calcium 8.9 - 10.3 mg/dL   7.6   Total Protein 6.5 - 8.1 g/dL   5.3   Total Bilirubin 0.3 - 1.2 mg/dL   4.6   Alkaline Phos 38 - 126 U/L   72   AST 15 - 41 U/L   45   ALT 0 - 44 U/L   36      Micro Bursa fluid culture NG so far  Impression/recommendation Septic bursitis  right knee- s/p wash out with cellulitis rt knee and thigh On cefazolin- May need upto 2 weeks- depend on how he improves  Decompensated cirrhosis with anasarca and edema legs  Thrombocytopenia and anemia due to the above   Cirrhosis due to Hepc which has been treated with SVR as per patient  Pt is homeless

## 2022-05-08 NOTE — Anesthesia Postprocedure Evaluation (Signed)
Anesthesia Post Note  Patient: Marco Spears  Procedure(s) Performed: INCISION AND DRAINAGE (Right: Knee)  Patient location during evaluation: PACU Anesthesia Type: General Level of consciousness: awake and alert Pain management: pain level controlled Vital Signs Assessment: post-procedure vital signs reviewed and stable Respiratory status: spontaneous breathing, nonlabored ventilation, respiratory function stable and patient connected to nasal cannula oxygen Cardiovascular status: blood pressure returned to baseline and stable Postop Assessment: no apparent nausea or vomiting Anesthetic complications: no   No notable events documented.   Last Vitals:  Vitals:   05/07/22 1932 05/08/22 0327  BP: 116/76 118/72  Pulse: 82 78  Resp: 16 16  Temp: 36.7 C 36.7 C  SpO2: 95% 99%    Last Pain:  Vitals:   05/07/22 1949  TempSrc:   PainSc: 0-No pain                 Lenard Simmer

## 2022-05-08 NOTE — Progress Notes (Signed)
Physical Therapy Treatment Patient Details Name: Marco Spears MRN: [id number] DOB: 1976/01/08 Today's Date: 05/08/2022   History of Present Illness Marco Spears is a 46 y.o. male who complains of spontaneous onset of diffuse right lower extremity edema beginning on Monday.  Patient states he woke up with right lower extremity edema with erythema and knee pain.  Pt is s/p incision and drainage of prepatellar bursa of the R knee on 05/07/22.    PT Comments    Pt was long sitting in bed with RN at bedside. He endorses nausea and previous vomiting however was cooperative and pleasant. Agreed to PT session and OOB activity. Was safely able to exit bed, stand to RW, and ambulate ~ 45 ft with RW. Pt has antalgic gait pattern but no LOB. Mostly limited by pain. Overall pt is progressing quickly. Acute PT will continue to address deficits while maximizing independence with ADLs.    Recommendations for follow up therapy are one component of a multi-disciplinary discharge planning process, led by the attending physician.  Recommendations may be updated based on patient status, additional functional criteria and insurance authorization.  Follow Up Recommendations  Follow physician's recommendations for discharge plan and follow up therapies Can patient physically be transported by private vehicle: No   Assistance Recommended at Discharge PRN  Patient can return home with the following A little help with walking and/or transfers;A little help with bathing/dressing/bathroom;Assist for transportation;Help with stairs or ramp for entrance   Equipment Recommendations  Rolling walker (2 wheels)       Precautions / Restrictions Precautions Precautions: None Restrictions Weight Bearing Restrictions: Yes RLE Weight Bearing: Weight bearing as tolerated     Mobility  Bed Mobility Overal bed mobility: Needs Assistance Bed Mobility: Supine to Sit  Supine to sit: Supervision  General bed mobility  comments: Pt required no physical assistance to exit bed. Slightly impulsive and does c/o pain but was able to perform.    Transfers Overall transfer level: Needs assistance Equipment used: Rolling walker (2 wheels) Transfers: Sit to/from Stand Sit to Stand: Min guard    General transfer comment: CGA for safety    Ambulation/Gait Ambulation/Gait assistance: Min guard Gait Distance (Feet): 45 Feet Assistive device: Rolling walker (2 wheels) Gait Pattern/deviations: Step-to pattern Gait velocity: decreased     General Gait Details: pt was able to ambulate with RW with antalgic step to gait pattern. no LOB but does tend to ambulate on toes on RLE. Encouraged heel strike and more wt on RLE but pt unable to tolerate. he does put forth great effort and was cooperative and pleasant throughout         Cognition Arousal/Alertness: Awake/alert Behavior During Therapy: WFL for tasks assessed/performed Overall Cognitive Status: Within Functional Limits for tasks assessed      General Comments: pt is A and O x 4               Pertinent Vitals/Pain Pain Assessment Pain Assessment: 0-10 Pain Score: 6  Pain Descriptors / Indicators: Tightness, Pressure Pain Intervention(s): Limited activity within patient's tolerance, Monitored during session, Premedicated before session, Repositioned     PT Goals (current goals can now be found in the care plan section) Acute Rehab PT Goals Patient Stated Goal: less pain and just feel better overall Progress towards PT goals: Progressing toward goals    Frequency    Min 2X/week      PT Plan Current plan remains appropriate       AM-PAC PT "  6 Clicks" Mobility   Outcome Measure  Help needed turning from your back to your side while in a flat bed without using bedrails?: A Little Help needed moving from lying on your back to sitting on the side of a flat bed without using bedrails?: A Little Help needed moving to and from a bed to a  chair (including a wheelchair)?: A Little Help needed standing up from a chair using your arms (e.g., wheelchair or bedside chair)?: A Little Help needed to walk in hospital room?: A Little Help needed climbing 3-5 steps with a railing? : A Little 6 Click Score: 18    End of Session   Activity Tolerance: Patient tolerated treatment well Patient left: in chair;with call bell/phone within reach;with nursing/sitter in room;with chair alarm set Nurse Communication: Mobility status PT Visit Diagnosis: Unsteadiness on feet (R26.81);Other abnormalities of gait and mobility (R26.89);Muscle weakness (generalized) (M62.81);Difficulty in walking, not elsewhere classified (R26.2)     Time: 8329-1916 PT Time Calculation (min) (ACUTE ONLY): 26 min  Charges:  $Gait Training: 8-22 mins $Therapeutic Activity: 8-22 mins                     Jetta Lout PTA 05/08/22, 5:37 PM

## 2022-05-09 MED ORDER — PHENOL 1.4 % MT LIQD
1.0000 | OROMUCOSAL | Status: DC | PRN
  Administered 2022-05-09: 1 via OROMUCOSAL
  Filled 2022-05-09: qty 177

## 2022-05-09 MED ORDER — PANTOPRAZOLE SODIUM 40 MG PO TBEC
40.0000 mg | DELAYED_RELEASE_TABLET | Freq: Every day | ORAL | Status: DC
  Administered 2022-05-10 – 2022-05-13 (×4): 40 mg via ORAL
  Filled 2022-05-09 (×4): qty 1

## 2022-05-09 NOTE — Plan of Care (Signed)
  Problem: Skin Integrity: Goal: Skin integrity will improve Outcome: Progressing   Problem: Education: Goal: Knowledge of General Education information will improve Description: Including pain rating scale, medication(s)/side effects and non-pharmacologic comfort measures Outcome: Progressing

## 2022-05-09 NOTE — Progress Notes (Signed)
[facility]  - [place] at [facility]   PATIENT NAME: Marco Spears    MR#:  [id number]  DATE OF BIRTH:  31-Oct-1975  SUBJECTIVE:  No vomiting. Tolerating po. sleepy Patient is status post right knee incision and drainage.   VITALS:  Blood pressure 129/74, pulse 95, temperature 98.3 F (36.8 C), temperature source Oral, resp. rate 18, height 6\' 3"  (1.905 m), weight 103.9 kg, SpO2 94 %.  PHYSICAL EXAMINATION:   GENERAL:  46 y.o.-year-old patient lying in the bed with no acute distress. Generalized Anasarca LUNGS: Normal breath sounds bilaterally CARDIOVASCULAR: S1, S2 normal. No murmurs, rubs, or gallops.  ABDOMEN: Soft, nontender, nondistended. Bowel sounds present. Abd wall edema+ ON Admisison  05/08/2022    Bilateral leg edema. Multiple tattoo+ in UE NEUROLOGIC: nonfocal  patient is alert and awake   LABORATORY PANEL:  CBC Recent Labs  Lab 05/05/22 1632 05/07/22 1005  WBC 5.5  --   HGB 10.8* 9.5*  HCT 31.5* 28.0*  PLT 51*  --      Chemistries  Recent Labs  Lab 05/05/22 1632 05/07/22 0401 05/07/22 1005  NA 132*  --  134*  K 3.3*  --  3.4*  CL 100  --  96*  CO2 27  --   --   GLUCOSE 117*  --  109*  BUN 14  --  6  CREATININE 0.66   < > 0.60*  CALCIUM 7.6*  --   --   AST 45*  --   --   ALT 36  --   --   ALKPHOS 72  --   --   BILITOT 4.6*  --   --    < > = values in this interval not displayed.    Cardiac Enzymes No results for input(s): "TROPONINI" in the last 168 hours. RADIOLOGY:  No results found.  Assessment and Plan Marco Spears is a 46 y.o. male with a past history of alcoholic cirrhosis, pancytopenia who comes ED complaining of pain, swelling, redness of the right leg that started this morning.  Constant, worsening.  Denies trauma.  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  Right leg cellulitis/sepsis right knee septic bursitis -- patient came in with severe right knee pain acute onset 3 to  4 days, tachycardia, tachypnea, elevated lactic acid, right knee erythema -- on IV vancomycin and Rocephin -- orthopedic consultation with Dr. 52 -- MRI right kneeDiffuse and marked subcutaneous soft tissue swelling/edema and skin thickening consistent with cellulitis. 2. Small focal fluid collection in the region of the prepatellar bursa could reflect bursitis. Could not exclude septic bursitis. 3. No findings suspicious for septic arthritis or osteomyelitis. 4. Very small knee joint effusion and 1 small air collection possibly recent joint aspiration. 5. Mild/early myositis but no findings for pyomyositis. 6. Incidental medial meniscus tear -- patient is status post irrigation and drainage of right knee prepatellar bursitis -- surgical Deep wound culture spending -- infectious disease consultation with Dr. Martha Clan to IV cefazolin --Operative Wound/synovial fluid cultures no growth  Chronic of cirrhosis of liver alcoholic/history of hepatitis C Pancytopenia bilateral lower extremity edema--severe Anasarca -- resume spironolactone and will give IV Lasix--increased dose -- lower extremity ultrasound negative for DVT -- continue lactulose -- ammonia within normal limit --UOP 5.8L  polysubstance drug abuse failure to thrive Homeless -- urine drug screen positive for cannabinoid and amphetamine -- serum ethanol negative -- patient denies IV drug use  Procedures: right knee I and D prepatellar bursitis Family communication : none Consults : orthopedic CODE STATUS: full DVT Prophylaxis : SCD Level of care: Med-Surg Status is: Inpatient Remains inpatient appropriate because: right knee infection. ID consult, PT/OT    TOTAL TIME TAKING CARE OF THIS PATIENT: 35 minutes.  >50% time spent on counselling and coordination of care  Note: This dictation was prepared with Dragon dictation along with smaller phrase technology. Any transcriptional errors that result  from this process are unintentional.  Marco Spears M.D    [facility]   CC: Primary care physician; Pcp, No

## 2022-05-09 NOTE — Progress Notes (Signed)
Subjective: 2 Days Post-Op Procedure(s) (LRB): INCISION AND DRAINAGE (Right) Patient is alert and oriented.  His knee pain is better.  Last white count was normal.  Patient reports pain as mild.  Objective:   VITALS:   Vitals:   05/08/22 1928 05/09/22 0354  BP: (!) 104/43 129/74  Pulse: (!) 57 95  Resp: 16 18  Temp: 97.7 F (36.5 C) 98.3 F (36.8 C)  SpO2: 99% 94%    Neurologically intact Incision: scant drainage  LABS Recent Labs    05/07/22 1005  HGB 9.5*  HCT 28.0*    Recent Labs    05/07/22 0401 05/07/22 1005  NA  --  134*  K  --  3.4*  BUN  --  6  CREATININE 0.79 0.60*  GLUCOSE  --  109*    No results for input(s): "LABPT", "INR" in the last 72 hours.   Assessment/Plan: 2 Days Post-Op Procedure(s) (LRB): INCISION AND DRAINAGE (Right)   Continue antibiotic treatment and and progressive ambulation.

## 2022-05-09 NOTE — Progress Notes (Signed)
Physical Therapy Treatment Patient Details Name: Marco Spears MRN: [id number] DOB: 27-Jan-1976 Today's Date: 05/09/2022   History of Present Illness Marco Spears is a 46 y.o. male who complains of spontaneous onset of diffuse right lower extremity edema beginning on Monday.  Patient states he woke up with right lower extremity edema with erythema and knee pain.  Pt is s/p incision and drainage of prepatellar bursa of the R knee on 05/07/22.    PT Comments    Pt was sitting EOB upon arriving requesting to go to BR for BM. He was able to stand to RW and ambulate with author only assisting by pushing IV pole. Pt had successful BM then proceeded to ambulate into hallway. Pt likes to have RW short with flexed posture due to low back pain. He tolerated gait ~ 120 ft without LOB or safety concern. Overall, pt is progressing well. He did mention upcoming DC and states confidence that he will be fine from a mobility standpoint. Author recommends RW prior to DC.    Recommendations for follow up therapy are one component of a multi-disciplinary discharge planning process, led by the attending physician.  Recommendations may be updated based on patient status, additional functional criteria and insurance authorization.  Follow Up Recommendations  Follow physician's recommendations for discharge plan and follow up therapies     Assistance Recommended at Discharge PRN  Patient can return home with the following A little help with walking and/or transfers;A little help with bathing/dressing/bathroom;Assist for transportation;Help with stairs or ramp for entrance   Equipment Recommendations  Rolling walker (2 wheels)       Precautions / Restrictions Precautions Precautions: None Restrictions Weight Bearing Restrictions: Yes RLE Weight Bearing: Weight bearing as tolerated     Mobility  Bed Mobility Overal bed mobility: Needs Assistance Bed Mobility: Sit to Supine, Supine to Sit     Supine to  sit: Supervision Sit to supine: Supervision     Transfers Overall transfer level: Needs assistance Equipment used: Rolling walker (2 wheels) Transfers: Sit to/from Stand Sit to Stand: Supervision     Ambulation/Gait Ambulation/Gait assistance: Supervision Gait Distance (Feet): 120 Feet Assistive device: Rolling walker (2 wheels) Gait Pattern/deviations: Antalgic Gait velocity: decreased   General Gait Details: pt was able to ambulate 120 ft with RW without LOB. pain limited distance. Recommend elevating RW however pt unwilling. states he has LBP    Balance Overall balance assessment: Needs assistance Sitting-balance support: Bilateral upper extremity supported, Feet supported Sitting balance-Leahy Scale: Good     Standing balance support: Bilateral upper extremity supported, During functional activity Standing balance-Leahy Scale: Good       Cognition Arousal/Alertness: Awake/alert Behavior During Therapy: WFL for tasks assessed/performed Overall Cognitive Status: Within Functional Limits for tasks assessed          General Comments: pt is A and O x 4               Pertinent Vitals/Pain Pain Assessment Pain Assessment: 0-10 Pain Score: 5  Pain Location: Tip of R Knee Pain Descriptors / Indicators: Tightness, Pressure Pain Intervention(s): Limited activity within patient's tolerance, Monitored during session, Premedicated before session, Repositioned     PT Goals (current goals can now be found in the care plan section) Acute Rehab PT Goals Patient Stated Goal: less pain and just feel better overall ( has been dealing with nausea) Progress towards PT goals: Progressing toward goals    Frequency    Min 2X/week  PT Plan Current plan remains appropriate       AM-PAC PT "6 Clicks" Mobility   Outcome Measure  Help needed turning from your back to your side while in a flat bed without using bedrails?: A Little Help needed moving from lying on  your back to sitting on the side of a flat bed without using bedrails?: A Little Help needed moving to and from a bed to a chair (including a wheelchair)?: A Little Help needed standing up from a chair using your arms (e.g., wheelchair or bedside chair)?: A Little Help needed to walk in hospital room?: A Little Help needed climbing 3-5 steps with a railing? : A Little 6 Click Score: 18    End of Session   Activity Tolerance: Patient tolerated treatment well Patient left: in bed;with call bell/phone within reach;with bed alarm set Nurse Communication: Mobility status PT Visit Diagnosis: Unsteadiness on feet (R26.81);Other abnormalities of gait and mobility (R26.89);Muscle weakness (generalized) (M62.81);Difficulty in walking, not elsewhere classified (R26.2)     Time: 0093-8182 PT Time Calculation (min) (ACUTE ONLY): 24 min  Charges:  $Gait Training: 8-22 mins $Therapeutic Activity: 8-22 mins                     Jetta Lout PTA 05/09/22, 5:01 PM

## 2022-05-10 LAB — BASIC METABOLIC PANEL
Anion gap: 6 (ref 5–15)
BUN: 13 mg/dL (ref 6–20)
CO2: 28 mmol/L (ref 22–32)
Calcium: 7.5 mg/dL — ABNORMAL LOW (ref 8.9–10.3)
Chloride: 99 mmol/L (ref 98–111)
Creatinine, Ser: 0.78 mg/dL (ref 0.61–1.24)
GFR, Estimated: >60 mL/min (ref >60)
Glucose, Bld: 137 mg/dL — ABNORMAL HIGH (ref 70–99)
Potassium: 3.4 mmol/L — ABNORMAL LOW (ref 3.5–5.1)
Sodium: 133 mmol/L — ABNORMAL LOW (ref 135–145)

## 2022-05-10 LAB — AEROBIC CULTURE W GRAM STAIN (SUPERFICIAL SPECIMEN)
Culture: NO GROWTH
Gram Stain: NONE SEEN

## 2022-05-10 LAB — CULTURE, BLOOD (ROUTINE X 2)
Culture: NO GROWTH
Culture: NO GROWTH
Special Requests: ADEQUATE
Special Requests: ADEQUATE

## 2022-05-10 LAB — BODY FLUID CULTURE W GRAM STAIN
Culture: NO GROWTH
Gram Stain: NONE SEEN
Special Requests: NORMAL

## 2022-05-10 NOTE — Progress Notes (Signed)
[facility]  - [place] [facility]   PATIENT NAME: Marco Spears    MR#:  [id number]  DATE OF BIRTH:  28-Jul-1976  SUBJECTIVE:  Improving slowly. Feels tired UOP good No fever Tolerating po diet   VITALS:  Blood pressure 114/75, pulse 84, temperature 98.6 F (37 C), temperature source Oral, resp. rate 18, height 6\' 3"  (1.905 m), weight 103.9 kg, SpO2 96 %.  PHYSICAL EXAMINATION:   GENERAL:  46 y.o.-year-old patient lying in the bed with no acute distress. Generalized Anasarca LUNGS: Normal breath sounds bilaterally CARDIOVASCULAR: S1, S2 normal. No murmurs, rubs, or gallops.  ABDOMEN: Soft, nontender, nondistended. Bowel sounds present. Abd wall edema+ ON Admisison  05/08/2022    Bilateral leg edema. Multiple tattoo+ in UE NEUROLOGIC: nonfocal  patient is alert and awake   LABORATORY PANEL:  CBC Recent Labs  Lab 05/05/22 1632 05/07/22 1005  WBC 5.5  --   HGB 10.8* 9.5*  HCT 31.5* 28.0*  PLT 51*  --      Chemistries  Recent Labs  Lab 05/05/22 1632 05/07/22 0401 05/10/22 0624  NA 132*   < > 133*  K 3.3*   < > 3.4*  CL 100   < > 99  CO2 27  --  28  GLUCOSE 117*   < > 137*  BUN 14   < > 13  CREATININE 0.66   < > 0.78  CALCIUM 7.6*  --  7.5*  AST 45*  --   --   ALT 36  --   --   ALKPHOS 72  --   --   BILITOT 4.6*  --   --    < > = values in this interval not displayed.    Cardiac Enzymes No results for input(s): "TROPONINI" in the last 168 hours. RADIOLOGY:  No results found.  Assessment and Plan Marco Spears is a 46 y.o. male with a past history of alcoholic cirrhosis, pancytopenia who comes ED complaining of pain, swelling, redness of the right leg that started this morning.  Constant, worsening.  Denies trauma.  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  Right leg cellulitis/sepsis right knee septic bursitis -- patient came in with severe right knee pain acute onset 3 to 4 days, tachycardia,  tachypnea, elevated lactic acid, right knee erythema -- on IV vancomycin and Rocephin -- orthopedic consultation with Dr. 52 -- MRI right kneeDiffuse and marked subcutaneous soft tissue swelling/edema and skin thickening consistent with cellulitis. 2. Small focal fluid collection in the region of the prepatellar bursa could reflect bursitis. Could not exclude septic bursitis. 3. No findings suspicious for septic arthritis or osteomyelitis. 4. Very small knee joint effusion and 1 small air collection possibly recent joint aspiration. 5. Mild/early myositis but no findings for pyomyositis. 6. Incidental medial meniscus tear -- patient is status post irrigation and drainage of right knee prepatellar bursitis -- surgical Deep wound culture spending -- infectious disease consultation with Dr. Martha Clan to IV cefazolin --Operative Wound/synovial fluid cultures no growth  Chronic of cirrhosis of liver alcoholic/history of hepatitis C Pancytopenia bilateral lower extremity edema--severe Anasarca -- resume spironolactone and will give IV Lasix--increased dose -- lower extremity ultrasound negative for DVT -- continue lactulose -- ammonia within normal limit --UOP 10.6 liters  polysubstance drug abuse failure to thrive Homeless -- urine drug screen positive for cannabinoid and amphetamine -- serum ethanol negative -- patient denies IV drug use  Procedures: right knee I and D prepatellar bursitis Family communication : none Consults : orthopedic CODE STATUS: full DVT Prophylaxis : SCD Level of care: Med-Surg Status is: Inpatient Remains inpatient appropriate because: right knee infection. Cont IV diuresis and IV abxs    TOTAL TIME TAKING CARE OF THIS PATIENT: 35 minutes.  >50% time spent on counselling and coordination of care  Note: This dictation was prepared with Dragon dictation along with smaller phrase technology. Any transcriptional errors that result  from this process are unintentional.  Enedina Finner M.D    [facility]   CC: Primary care physician; Pcp, No

## 2022-05-11 DIAGNOSIS — M71161 Other infective bursitis, right knee: Secondary | ICD-10-CM

## 2022-05-11 DIAGNOSIS — A491 Streptococcal infection, unspecified site: Secondary | ICD-10-CM

## 2022-05-11 LAB — CBC
HCT: 30.2 % — ABNORMAL LOW (ref 39.0–52.0)
Hemoglobin: 10.4 g/dL — ABNORMAL LOW (ref 13.0–17.0)
MCH: 34.4 pg — ABNORMAL HIGH (ref 26.0–34.0)
MCHC: 34.4 g/dL (ref 30.0–36.0)
MCV: 100 fL (ref 80.0–100.0)
Platelets: 101 10*3/uL — ABNORMAL LOW (ref 150–400)
RBC: 3.02 MIL/uL — ABNORMAL LOW (ref 4.22–5.81)
RDW: 16.2 % — ABNORMAL HIGH (ref 11.5–15.5)
WBC: 6.2 10*3/uL (ref 4.0–10.5)
nRBC: 0 % (ref 0.0–0.2)

## 2022-05-11 NOTE — Progress Notes (Signed)
ID Pt has les [place] rt leg Able to walk Swelling improving all over  O/e awake and alert Chest b/l air entry Crepts bases [id number] Abd soft Ascites Cns non focal Rt leg erythema over the thigh resolved Some redness over the knee- surgical site covered with hone comb  Labs    Latest Ref Rng & Units 05/11/2022    4:00 AM 05/07/2022   10:05 AM 05/05/2022    4:32 PM  CBC  WBC 4.0 - 10.5 K/uL 6.2   5.5   Hemoglobin 13.0 - 17.0 g/dL 28.6  9.5  38.1   Hematocrit 39.0 - 52.0 % 30.2  28.0  31.5   Platelets 150 - 400 K/uL 101   51         Latest Ref Rng & Units 05/10/2022    6:24 AM 05/07/2022   10:05 AM 05/07/2022    4:01 AM  CMP  Glucose 70 - 99 mg/dL 771  165    BUN 6 - 20 mg/dL 13  6    Creatinine 7.90 - 1.24 mg/dL 3.83  3.38  3.29   Sodium 135 - 145 mmol/L 133  134    Potassium 3.5 - 5.1 mmol/L 3.4  3.4    Chloride 98 - 111 mmol/L 99  96    CO2 22 - 32 mmol/L 28     Calcium 8.9 - 10.3 mg/dL 7.5       Micro Rt knee bursa fluid- Streptococcus pyogenes  Impression/recommendation Rt knee streptococcus pyogenes bursitis Underwent I/D On cefazolin and improving While in the hospital will continue IV and on discharge will go on keflex until 05/29/22  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

## 2022-05-11 NOTE — Progress Notes (Signed)
  Subjective:  POD #4 s/p I&D of the right prepatellar bursa.   Patient reports right knee pain as moderate.    Objective:   VITALS:   Vitals:   05/10/22 0800 05/10/22 2047 05/11/22 0427 05/11/22 0810  BP: 114/75 105/64 101/62 102/61  Pulse: 84 88 86 81  Resp: 18 16 18 18   Temp: 98.6 F (37 C) 98.3 F (36.8 C) 98.3 F (36.8 C) 98.7 F (37.1 C)  TempSrc: Oral Oral    SpO2: 96% 100% 91% 93%  Weight:      Height:        PHYSICAL EXAM: Right lower knee: Right lower extremity edema is improving.  Erythema, swelling and ecchymosis around the right knee are slowly improving.  Patient has no evidence of a knee effusion.  Knee range of motion is improving.  Patient distally is neurovascular intact.  I personally change the patient's dressings today.  There is no active drainage from the incision.  Compartments are soft and compressible.  There is no calf tenderness.   LABS  Results for orders placed or performed during the hospital encounter of 05/05/22 (from the past 24 hour(s))  CBC     Status: Abnormal   Collection Time: 05/11/22  4:00 AM  Result Value Ref Range   WBC 6.2 4.0 - 10.5 K/uL   RBC 3.02 (L) 4.22 - 5.81 MIL/uL   Hemoglobin 10.4 (L) 13.0 - 17.0 g/dL   HCT 05/13/22 (L) 54.2 - 70.6 %   MCV 100.0 80.0 - 100.0 fL   MCH 34.4 (H) 26.0 - 34.0 pg   MCHC 34.4 30.0 - 36.0 g/dL   RDW 23.7 (H) 62.8 - 31.5 %   Platelets 101 (L) 150 - 400 K/uL   nRBC 0.0 0.0 - 0.2 %    No results found.  Assessment/Plan: 4 Days Post-Op   Principal Problem:   Cellulitis Active Problems:   Alcoholic cirrhosis of liver with ascites (HCC)   Hypokalemia   Anemia   Sepsis (HCC)   Cellulitis of right leg   Cirrhosis of liver without ascites (HCC)   Polysubstance abuse (HCC)  Right knee incision does not have active drainage.  Erythema and ecchymosis around the right knee are improving.  Continue IV antibiotics per infectious disease.  Continue physical therapy as tolerated.  Patient is  weightbearing as tolerated on the right lower extremity.  Patient may be discharged from an orthopedic standpoint once cleared medically.  Patient will follow-up in the office in 7 to 10 days after discharge for wound check and suture removal.  Patient on Eliquis for DVT prophylaxis.    17.6 , MD 05/11/2022, 2:15 PM

## 2022-05-11 NOTE — Progress Notes (Signed)
[facility]  -  at [facility]   PATIENT NAME: Marco Spears    MR#:  [id number]  DATE OF BIRTH:  03/06/1976  SUBJECTIVE:  Improving slowly. Feels tired UOP good No fever Tolerating po diet. Ambulating up to the bathroom using walker   VITALS:  Blood pressure 102/61, pulse 81, temperature 98.7 F (37.1 C), resp. rate 18, height 6\' 3"  (1.905 m), weight 103.9 kg, SpO2 93 %.  PHYSICAL EXAMINATION:   GENERAL:  46 y.o.-year-old patient lying in the bed with no acute distress. Generalized Anasarca LUNGS: Normal breath sounds bilaterally CARDIOVASCULAR: S1, S2 normal. No murmurs, rubs, or gallops.  ABDOMEN: Soft, nontender, nondistended. Bowel sounds present. Abd wall edema+ ON Admisison  05/08/2022    Bilateral leg edema. Multiple tattoo+ in UE NEUROLOGIC: nonfocal  patient is alert and awake   LABORATORY PANEL:  CBC Recent Labs  Lab 05/11/22 0400  WBC 6.2  HGB 10.4*  HCT 30.2*  PLT 101*     Chemistries  Recent Labs  Lab 05/05/22 1632 05/07/22 0401 05/10/22 0624  NA 132*   < > 133*  K 3.3*   < > 3.4*  CL 100   < > 99  CO2 27  --  28  GLUCOSE 117*   < > 137*  BUN 14   < > 13  CREATININE 0.66   < > 0.78  CALCIUM 7.6*  --  7.5*  AST 45*  --   --   ALT 36  --   --   ALKPHOS 72  --   --   BILITOT 4.6*  --   --    < > = values in this interval not displayed.    Cardiac Enzymes No results for input(s): "TROPONINI" in the last 168 hours. RADIOLOGY:  No results found.  Assessment and Plan Marco Spears is a 46 y.o. male with a past history of alcoholic cirrhosis, pancytopenia who comes ED complaining of pain, swelling, redness of the right leg that started this morning.  Constant, worsening.  Denies trauma.  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  Right leg cellulitis/sepsis right knee septic bursitis -- patient came in with severe right knee pain acute onset 3 to 4 days, tachycardia, tachypnea,  elevated lactic acid, right knee erythema -- orthopedic consultation with Dr. 52 -- MRI right kneeDiffuse and marked subcutaneous soft tissue swelling/edema and skin thickening consistent with cellulitis. 2. Small focal fluid collection in the region of the prepatellar bursa could reflect bursitis. Could not exclude septic bursitis. 3. No findings suspicious for septic arthritis or osteomyelitis. 4. Very small knee joint effusion and 1 small air collection possibly recent joint aspiration. 5. Mild/early myositis but no findings for pyomyositis. 6. Incidental medial meniscus tear -- patient is status post irrigation and drainage of right knee prepatellar bursitis -- surgical Deep wound culture growing group a strep -- infectious disease consultation with Dr. Martha Clan to IV cefazolin -- sepsis resolved  Chronic of cirrhosis of liver alcoholic/history of hepatitis C Pancytopenia bilateral lower extremity edema--severe Anasarca -- resume spironolactone and will give IV Lasix--increased dose -- lower extremity ultrasound negative for DVT -- continue lactulose -- ammonia within normal limit --UOP ~ 15 liters  polysubstance drug abuse failure to thrive Homeless -- urine drug screen positive for cannabinoid and amphetamine -- serum ethanol negative -- patient denies IV drug use    Procedures: right knee I and D prepatellar bursitis Family communication :  none Consults : orthopedic CODE STATUS: full DVT Prophylaxis : SCD Level of care: Med-Surg Status is: Inpatient Remains inpatient appropriate because: right knee infection. Cont IV diuresis for 1 to 2 days since his responding very well and IV abxs     TOTAL TIME TAKING CARE OF THIS PATIENT: 35 minutes.  >50% time spent on counselling and coordination of care  Note: This dictation was prepared with Dragon dictation along with smaller phrase technology. Any transcriptional errors that result from this process  are unintentional.  Enedina Finner M.D    [facility]   CC: Primary care physician; Pcp, No

## 2022-05-11 NOTE — Progress Notes (Signed)
Physical Therapy Treatment Patient Details Name: Marco Spears MRN: [id number] DOB: 1975/11/17 Today's Date: 05/11/2022   History of Present Illness Pt is a 46 y.o. male who complains of spontaneous onset of diffuse right lower extremity edema beginning on Monday.  Patient states he woke up with right lower extremity edema with erythema and knee pain.  Pt is s/p incision and drainage of prepatellar bursa of the R knee on 05/07/22.    PT Comments    Pt was pleasant and motivated to participate during the session and put forth good effort throughout. Pt able to complete sit to supine w/ supervision. Pt able to complete sit to stands w/ supervision with extra time and effort; minimal wbing on RLE when initiating stand. Pt ambulate 165ft using RW w/ CGA but with very slow effortful gait and no LOB. Very reliant on UE support to advance LLE with minimal wbing on RLE during gait due to pain. Pt was able to perform static standing balance activities while reaching out of BOS with occasional reaching out for UE support when wbing on RLE. Current PT plan is to follow physician's recommendations upon discharge to safely address deficits listed in patient problem list for decreased caregiver assistance and eventual return to PLOF.    Recommendations for follow up therapy are one component of a multi-disciplinary discharge planning process, led by the attending physician.  Recommendations may be updated based on patient status, additional functional criteria and insurance authorization.  Follow Up Recommendations  Follow physician's recommendations for discharge plan and follow up therapies     Assistance Recommended at Discharge Intermittent Supervision/Assistance  Patient can return home with the following A little help with walking and/or transfers;A little help with bathing/dressing/bathroom;Assist for transportation;Help with stairs or ramp for entrance;Assistance with cooking/housework   Equipment  Recommendations  Rolling walker (2 wheels)    Recommendations for Other Services       Precautions / Restrictions Precautions Precautions: None Restrictions Weight Bearing Restrictions: Yes RLE Weight Bearing: Weight bearing as tolerated     Mobility  Bed Mobility Overal bed mobility: Needs Assistance Bed Mobility: Sit to Supine       Sit to supine: Supervision   General bed mobility comments: Pt recieved completing toileting and ending in bed    Transfers Overall transfer level: Needs assistance Equipment used: Rolling walker (2 wheels) Transfers: Sit to/from Stand Sit to Stand: Supervision           General transfer comment: extra time and effort; hestiant to WB on RLE    Ambulation/Gait Ambulation/Gait assistance: Min guard Gait Distance (Feet): 125 Feet Assistive device: Rolling walker (2 wheels) Gait Pattern/deviations: Step-to pattern, Decreased step length - right, Decreased step length - left, Decreased stance time - right, Antalgic Gait velocity: decreased     General Gait Details: very slow effort gait w/ no LOB; very reliant on UE support to advance with minimal WBing on RLE due to pain   Stairs             Wheelchair Mobility    Modified Rankin (Stroke Patients Only)       Balance Overall balance assessment: Needs assistance Sitting-balance support: Bilateral upper extremity supported, Feet supported Sitting balance-Leahy Scale: Good     Standing balance support: Bilateral upper extremity supported, During functional activity Standing balance-Leahy Scale: Fair Standing balance comment: able to perform static standing while reaching out of BOS w/ no LOB but with minimal WBing on RLE  Cognition Arousal/Alertness: Awake/alert Behavior During Therapy: WFL for tasks assessed/performed Overall Cognitive Status: Within Functional Limits for tasks assessed                                           Exercises General Exercises - Lower Extremity Ankle Circles/Pumps: Strengthening, Both, 10 reps Quad Sets: Strengthening, Both, 10 reps Gluteal Sets: Strengthening, Both, 10 reps Long Arc Quad: Strengthening, Both, 10 reps Heel Slides: Strengthening, Both, 10 reps    General Comments        Pertinent Vitals/Pain Pain Assessment Pain Assessment: 0-10 Pain Score: 9  Pain Location: R knee Pain Descriptors / Indicators: Aching, Constant, Grimacing, Discomfort Pain Intervention(s): Monitored during session, Repositioned    Home Living                          Prior Function            PT Goals (current goals can now be found in the care plan section) Progress towards PT goals: Progressing toward goals    Frequency    Min 2X/week      PT Plan Current plan remains appropriate    Co-evaluation              AM-PAC PT "6 Clicks" Mobility   Outcome Measure  Help needed turning from your back to your side while in a flat bed without using bedrails?: A Little Help needed moving from lying on your back to sitting on the side of a flat bed without using bedrails?: A Little Help needed moving to and from a bed to a chair (including a wheelchair)?: A Little Help needed standing up from a chair using your arms (e.g., wheelchair or bedside chair)?: A Little Help needed to walk in hospital room?: A Little Help needed climbing 3-5 steps with a railing? : A Little 6 Click Score: 18    End of Session Equipment Utilized During Treatment: Gait belt Activity Tolerance: Patient tolerated treatment well Patient left: in bed;with call bell/phone within reach;with bed alarm set Nurse Communication: Mobility status PT Visit Diagnosis: Unsteadiness on feet (R26.81);Muscle weakness (generalized) (M62.81);Difficulty in walking, not elsewhere classified (R26.2);Pain Pain - Right/Left: Right Pain - part of body: Knee     Time: 1135-1202 PT Time  Calculation (min) (ACUTE ONLY): 27 min  Charges:                        Marica Otter, SPT  05/11/2022, 1:10 PM

## 2022-05-12 ENCOUNTER — Inpatient Hospital Stay: Payer: Self-pay

## 2022-05-12 ENCOUNTER — Other Ambulatory Visit: Payer: Self-pay

## 2022-05-12 DIAGNOSIS — M7041 Prepatellar bursitis, right knee: Secondary | ICD-10-CM

## 2022-05-12 LAB — AEROBIC/ANAEROBIC CULTURE W GRAM STAIN (SURGICAL/DEEP WOUND)
Culture: NO GROWTH
Gram Stain: NONE SEEN
Gram Stain: NONE SEEN

## 2022-05-12 MED ORDER — SPIRONOLACTONE 50 MG PO TABS
100.0000 mg | ORAL_TABLET | Freq: Every day | ORAL | 3 refills | Status: DC
  Filled 2022-05-12: qty 30, 30d supply, fill #0
  Filled 2022-05-13: qty 60, 30d supply, fill #0

## 2022-05-12 MED ORDER — POTASSIUM CHLORIDE CRYS ER 20 MEQ PO TBCR
20.0000 meq | EXTENDED_RELEASE_TABLET | Freq: Every day | ORAL | Status: DC
  Administered 2022-05-12 – 2022-05-13 (×2): 20 meq via ORAL
  Filled 2022-05-12 (×2): qty 1

## 2022-05-12 MED ORDER — FUROSEMIDE 40 MG PO TABS
40.0000 mg | ORAL_TABLET | Freq: Two times a day (BID) | ORAL | 2 refills | Status: DC
  Filled 2022-05-12 – 2022-05-13 (×2): qty 60, 30d supply, fill #0

## 2022-05-12 MED ORDER — FUROSEMIDE 10 MG/ML IJ SOLN
40.0000 mg | Freq: Three times a day (TID) | INTRAMUSCULAR | Status: AC
  Administered 2022-05-12: 40 mg via INTRAVENOUS
  Filled 2022-05-12: qty 4

## 2022-05-12 MED ORDER — CEPHALEXIN 500 MG PO CAPS
500.0000 mg | ORAL_CAPSULE | Freq: Four times a day (QID) | ORAL | 0 refills | Status: DC
  Filled 2022-05-12: qty 64, 16d supply, fill #0

## 2022-05-12 MED ORDER — FOLIC ACID 1 MG PO TABS
1.0000 mg | ORAL_TABLET | Freq: Every day | ORAL | 0 refills | Status: DC
  Filled 2022-05-12: qty 30, 30d supply, fill #0

## 2022-05-12 MED ORDER — FERROUS SULFATE 325 (65 FE) MG PO TABS
325.0000 mg | ORAL_TABLET | Freq: Every day | ORAL | 3 refills | Status: DC
  Filled 2022-05-12: qty 30, 30d supply, fill #0

## 2022-05-12 MED ORDER — APIXABAN 2.5 MG PO TABS
2.5000 mg | ORAL_TABLET | Freq: Two times a day (BID) | ORAL | 0 refills | Status: DC
  Filled 2022-05-12: qty 20, 10d supply, fill #0

## 2022-05-12 MED ORDER — CEPHALEXIN 500 MG PO CAPS
500.0000 mg | ORAL_CAPSULE | Freq: Four times a day (QID) | ORAL | Status: DC
  Administered 2022-05-13 (×2): 500 mg via ORAL
  Filled 2022-05-12 (×2): qty 1

## 2022-05-12 MED ORDER — FUROSEMIDE 40 MG PO TABS
40.0000 mg | ORAL_TABLET | Freq: Two times a day (BID) | ORAL | Status: DC
  Administered 2022-05-13: 40 mg via ORAL
  Filled 2022-05-12: qty 1

## 2022-05-12 MED ORDER — POTASSIUM CHLORIDE CRYS ER 20 MEQ PO TBCR
20.0000 meq | EXTENDED_RELEASE_TABLET | Freq: Every day | ORAL | 0 refills | Status: DC
  Filled 2022-05-12: qty 30, 30d supply, fill #0

## 2022-05-12 NOTE — TOC Progression Note (Signed)
Transition of Care [facility]) - Progression Note    Patient Details  Name: Marco Spears MRN: [id number] Date of Birth: 06/19/1976  [facility] CM/SW Contact  Beverly Sessions, RN Phone Number: 05/12/2022, 10:27 AM  Clinical Narrative:      Met with patient at bed side.   He states he has not been able to obtain his prison ID or medicaid card from his brother  RW to be delivered to room [facility] following for DC medication needs Patient states he will need a cab voucher to [address] at discharge, which is his brothers boarding house where he will be staying a couple of days at discharge       Expected Discharge Plan and [place] (SDOH) Interventions    Readmission Risk Interventions    05/06/2022    2:26 PM  Readmission Risk Prevention Plan  Transportation Screening Complete  Medication Review [employer]) Complete

## 2022-05-12 NOTE — Progress Notes (Addendum)
[facility]  - [facility]   PATIENT NAME: Marco Spears    MR#:  [id number]  DATE OF BIRTH:  [birth year]  SUBJECTIVE:  Improving slowly. Feels tired UOP good No fever Tolerating po diet. Ambulating up to the bathroom using walker Pt feels he has fluid in his abdomen. Had paracentesis done about 7 months ago    VITALS:  Blood pressure 107/67, pulse 84, temperature 98.5 F (36.9 C), resp. rate 16, height 6\' 3"  (1.905 m), weight 103.9 kg, SpO2 96 %.  PHYSICAL EXAMINATION:   GENERAL:  46 y.o.-year-old patient lying in the bed with no acute distress. Generalized Anasarca LUNGS: Normal breath sounds bilaterally CARDIOVASCULAR: S1, S2 normal. No murmurs, rubs, or gallops.  ABDOMEN: Soft, nontender, +distended. Bowel sounds present. Abd wall edema+, ?fluid thrill ON Admisison  05/08/2022    Bilateral leg edema much improved  Multiple tattoo+ in UE NEUROLOGIC: nonfocal  patient is alert and awake   LABORATORY PANEL:  CBC Recent Labs  Lab 05/11/22 0400  WBC 6.2  HGB 10.4*  HCT 30.2*  PLT 101*     Chemistries  Recent Labs  Lab 05/05/22 1632 05/07/22 0401 05/10/22 0624  NA 132*   < > 133*  K 3.3*   < > 3.4*  CL 100   < > 99  CO2 27  --  28  GLUCOSE 117*   < > 137*  BUN 14   < > 13  CREATININE 0.66   < > 0.78  CALCIUM 7.6*  --  7.5*  AST 45*  --   --   ALT 36  --   --   ALKPHOS 72  --   --   BILITOT 4.6*  --   --    < > = values in this interval not displayed.    Cardiac Enzymes No results for input(s): "TROPONINI" in the last 168 hours. RADIOLOGY:  No results found.  Assessment and Plan Marco Spears is a 46 y.o. male with a past history of alcoholic cirrhosis, pancytopenia who comes ED complaining of pain, swelling, redness of the right leg that started this morning.  Constant, worsening.  Denies trauma.  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  Right leg cellulitis/sepsis right knee septic  bursitis -- patient came in with severe right knee pain acute onset 3 to 4 days, tachycardia, tachypnea, elevated lactic acid, right knee erythema -- orthopedic consultation with Dr. 52 -- MRI right kneeDiffuse and marked subcutaneous soft tissue swelling/edema and skin thickening consistent with cellulitis. 2. Small focal fluid collection in the region of the prepatellar bursa could reflect bursitis. Could not exclude septic bursitis. 3. No findings suspicious for septic arthritis or osteomyelitis. 4. Very small knee joint effusion and 1 small air collection possibly recent joint aspiration. 5. Mild/early myositis but no findings for pyomyositis. 6. Incidental medial meniscus tear -- patient is status post irrigation and drainage of right knee prepatellar bursitis -- surgical Deep wound culture growing group a strep -- infectious disease consultation with Dr. Martha Clan to IV cefazolin -- sepsis resolved  Chronic of cirrhosis of liver alcoholic/history of hepatitis C Pancytopenia bilateral lower extremity edema--severe Anasarca/?Ascites -- resume spironolactone and will give IV Lasix--increased dose -- lower extremity ultrasound negative for DVT -- continue lactulose -- ammonia within normal limit --UOP ~ 15 liters --change to po lasix 40 mg bid and spironolactone 100 mg daily --add low dose kCL --Rolland Bimler paracentesis ordered.  polysubstance drug abuse failure to thrive Homeless -- urine drug screen positive for cannabinoid and amphetamine -- serum ethanol negative -- patient denies IV drug use  DVT prophylaxis --given plt count and liver dz placed on po eliquis 2.5 mg bid--cont for 10 days till pt sees dr Shela Nevin as out pt  patient overall improving. If remains stable he should be able to discharge tomorrow with oral meds that will be given from [facility] employee pharmacy. Rolling walker provided.    Procedures: right knee I and D prepatellar bursitis Family  communication : none Consults : orthopedic CODE STATUS: full DVT Prophylaxis : eliquis Level of care: Med-Surg Status is: Inpatient Remains inpatient appropriate because: right knee infection. Cont IV diuresis for 1 to 2 days since his responding very well and IV abxs     TOTAL TIME TAKING CARE OF THIS PATIENT: 35 minutes.  >50% time spent on counselling and coordination of care  Note: This dictation was prepared with Dragon dictation along with smaller phrase technology. Any transcriptional errors that result from this process are unintentional.  Enedina Finner M.D    [facility]   CC: Primary care physician; Pcp, No

## 2022-05-12 NOTE — Progress Notes (Signed)
  Subjective:  POD #5 s/p I&D of the right knee prepatellar septic bursitis.   Patient reports right knee pain as mild to moderate.  Patient sleeping comfortably when I entered the room.    Objective:   VITALS:   Vitals:   05/11/22 0810 05/11/22 1715 05/11/22 2105 05/12/22 0824  BP: 102/61 113/72 (!) 95/58 107/67  Pulse: 81 (!) 101 91 84  Resp: 18 18 18 16   Temp: 98.7 F (37.1 C)  99 F (37.2 C) 98.5 F (36.9 C)  TempSrc:      SpO2: 93% 99% 97% 96%  Weight:      Height:        PHYSICAL EXAM: Right knee:  Neurovascular intact Sensation intact distally Intact pulses distally Dorsiflexion/Plantar flexion intact Dressing: moderate serous drainage.  I personally change the dressing.  There is no active drainage from the his incision.  No drainage could be expressed.  There is no purulent drainage. No cellulitis present Compartment soft  LABS  No results found for this or any previous visit (from the past 24 hour(s)).  No results found.  Assessment/Plan: 5 Days Post-Op   Principal Problem:   Cellulitis Active Problems:   Alcoholic cirrhosis of liver with ascites (HCC)   Hypokalemia   Anemia   Sepsis (HCC)   Cellulitis of right leg   Cirrhosis of liver without ascites (HCC)   Polysubstance abuse (HCC)   Continue dressing changes as needed.  Patient may continue physical therapy as tolerated.  Patient is weightbearing as tolerated right lower extremity.  Continue antibiotics per ID.  Continue Eliquis for DVT prophylaxis.  Patient may be discharged from an orthopedic standpoint, once cleared medically.  He will follow-up at [facility] in [place] in 7 to 10 days after discharge for suture removal and wound check.   , MD 05/12/2022, 10:43 AM

## 2022-05-12 NOTE — Progress Notes (Signed)
Physical Therapy Treatment Patient Details Name: Marco Spears MRN: [id number] DOB: 1976-10-05 Today's Date: 05/12/2022   History of Present Illness Pt is a 46 y.o. male who complains of spontaneous onset of diffuse right lower extremity edema beginning on Monday.  Patient states he woke up with right lower extremity edema with erythema and knee pain.  Pt is s/p incision and drainage of prepatellar bursa of the R knee on 05/07/22.    PT Comments    Pt was pleasant and motivated to participate during the session and put forth good effort throughout. Pt able to complete supine<>sit w/ supervision. Pt completes sit to stands w/ supervision but tends to have poor eccentric control. Pt ambulated 66ft x2 using RW w/ CGA with slow steady gait and improved weightbearing with RLE. Cued for step through pattern and able to do so but with decreased stance time on RLE and very antalgicre and still using UE support to help advance. Pt able to complete x2 steps backwards using RW w/ CGA following visual/verbal cuing on sequencing; steady throughout and no LOB. Current PT plan is to follow physician's recommendations upon discharge to safely address deficits listed in patient problem list for decreased caregiver assistance and eventual return to PLOF.   Recommendations for follow up therapy are one component of a multi-disciplinary discharge planning process, led by the attending physician.  Recommendations may be updated based on patient status, additional functional criteria and insurance authorization.  Follow Up Recommendations  Follow physician's recommendations for discharge plan and follow up therapies     Assistance Recommended at Discharge Intermittent Supervision/Assistance  Patient can return home with the following A little help with walking and/or transfers;A little help with bathing/dressing/bathroom;Assist for transportation;Help with stairs or ramp for entrance;Assistance with  cooking/housework   Equipment Recommendations  Rolling walker (2 wheels)    Recommendations for Other Services       Precautions / Restrictions Precautions Precautions: Fall Restrictions Weight Bearing Restrictions: Yes RLE Weight Bearing: Weight bearing as tolerated     Mobility  Bed Mobility Overal bed mobility: Needs Assistance Bed Mobility: Supine to Sit     Supine to sit: Supervision          Transfers Overall transfer level: Needs assistance Equipment used: Rolling walker (2 wheels) Transfers: Sit to/from Stand Sit to Stand: Supervision                Ambulation/Gait Ambulation/Gait assistance: Min guard Gait Distance (Feet): 40 Feet x2 Assistive device: Rolling walker (2 wheels) Gait Pattern/deviations: Step-to pattern, Decreased step length - right, Decreased step length - left, Decreased stance time - right, Antalgic Gait velocity: decreased     General Gait Details: slow effortful gait w/ no LOB. improved weightbearing on RLE   Stairs Stairs: Yes Stairs assistance: Min guard Stair Management: Backwards, Forwards, With walker Number of Stairs: 2 General stair comments: stable with no LOB; good use of UE support and LLE concentric/eccentric control   Wheelchair Mobility    Modified Rankin (Stroke Patients Only)       Balance Overall balance assessment: Needs assistance Sitting-balance support: Bilateral upper extremity supported, Feet supported Sitting balance-Leahy Scale: Good     Standing balance support: Bilateral upper extremity supported, During functional activity Standing balance-Leahy Scale: Fair                              Cognition Arousal/Alertness: Awake/alert Behavior During Therapy: WFL for tasks assessed/performed Overall  Cognitive Status: Within Functional Limits for tasks assessed                                          Exercises Other Exercises Other Exercises: pt education  on step sequencing going backwards using RW    General Comments        Pertinent Vitals/Pain Pain Assessment Pain Assessment: 0-10 Pain Score: 5  Pain Location: R knee Pain Descriptors / Indicators: Aching, Constant, Grimacing, Discomfort Pain Intervention(s): Monitored during session, Repositioned    Home Living                          Prior Function            PT Goals (current goals can now be found in the care plan section) Progress towards PT goals: Progressing toward goals    Frequency    Min 2X/week      PT Plan Current plan remains appropriate    Co-evaluation              AM-PAC PT "6 Clicks" Mobility   Outcome Measure  Help needed turning from your back to your side while in a flat bed without using bedrails?: None Help needed moving from lying on your back to sitting on the side of a flat bed without using bedrails?: None Help needed moving to and from a bed to a chair (including a wheelchair)?: A Little Help needed standing up from a chair using your arms (e.g., wheelchair or bedside chair)?: A Little Help needed to walk in hospital room?: A Little Help needed climbing 3-5 steps with a railing? : A Little 6 Click Score: 20    End of Session Equipment Utilized During Treatment: Gait belt Activity Tolerance: Patient tolerated treatment well Patient left: in chair;with call bell/phone within reach Nurse Communication: Mobility status (RN notified of chair alarm needing batteries) PT Visit Diagnosis: Unsteadiness on feet (R26.81);Muscle weakness (generalized) (M62.81);Difficulty in walking, not elsewhere classified (R26.2);Pain Pain - Right/Left: Right Pain - part of body: Knee     Time: 9833-8250 PT Time Calculation (min) (ACUTE ONLY): 30 min  Charges:                        Marica Otter, SPT  05/12/2022, 3:26 PM

## 2022-05-12 NOTE — Progress Notes (Signed)
ID Pt says when he walked and bent his knee he noted some blood from the surgical site Pain is better  than before   O/e awake and alert Patient Vitals for the past 24 hrs:  BP Temp Pulse Resp SpO2  05/12/22 1603 113/74 97.9 F (36.6 C) 85 16 100 %  05/12/22 0824 107/67 98.5 F (36.9 C) 84 16 96 %  05/11/22 2105 (!) 95/58 99 F (37.2 C) 91 18 97 %    Anasarca Chest b/l air entry [id number] Abdominal wall edema-  Edema legs/thighs rt > left Erythema thigh resolved He has port wine stain near the rt knee joint Surgical site clean- sutures in place Some tenderness on the rt side     Labs    Latest Ref Rng & Units 05/11/2022    4:00 AM 05/07/2022   10:05 AM 05/05/2022    4:32 PM  CBC  WBC 4.0 - 10.5 K/uL 6.2   5.5   Hemoglobin 13.0 - 17.0 g/dL 37.9  9.5  02.4   Hematocrit 39.0 - 52.0 % 30.2  28.0  31.5   Platelets 150 - 400 K/uL 101   51        Latest Ref Rng & Units 05/10/2022    6:24 AM 05/07/2022   10:05 AM 05/07/2022    4:01 AM  CMP  Glucose 70 - 99 mg/dL 097  353    BUN 6 - 20 mg/dL 13  6    Creatinine 2.99 - 1.24 mg/dL 2.42  6.83  4.19   Sodium 135 - 145 mmol/L 133  134    Potassium 3.5 - 5.1 mmol/L 3.4  3.4    Chloride 98 - 111 mmol/L 99  96    CO2 22 - 32 mmol/L 28     Calcium 8.9 - 10.3 mg/dL 7.5        Impression/recommendation Rt leg cellulitis and rt knee prepatellar bursitis due to Group A strep On cefazolin And improving Cannot use amoxicillin on discharge because of allergy, so will go on Keflex 500mg  Po Q 6 for 2 weeks until 05/28/22 Will follow him as OP  Cirrhosis liver- decompensated- was followed at [facility] [address]-  Treated hepc  Anasarca due to the above- being diuresed  Thrombocytopenia due to the above Anemia   Hyponatremia and hypokalemia due to cirrhosis  Discussed the management with the patient and the care team

## 2022-05-13 ENCOUNTER — Inpatient Hospital Stay: Payer: Self-pay

## 2022-05-13 ENCOUNTER — Other Ambulatory Visit: Payer: Self-pay

## 2022-05-13 DIAGNOSIS — K7030 Alcoholic cirrhosis of liver without ascites: Secondary | ICD-10-CM

## 2022-05-13 DIAGNOSIS — D649 Anemia, unspecified: Secondary | ICD-10-CM

## 2022-05-13 LAB — CBC
HCT: 31.7 % — ABNORMAL LOW (ref 39.0–52.0)
Hemoglobin: 10.5 g/dL — ABNORMAL LOW (ref 13.0–17.0)
MCH: 34.7 pg — ABNORMAL HIGH (ref 26.0–34.0)
MCHC: 33.1 g/dL (ref 30.0–36.0)
MCV: 104.6 fL — ABNORMAL HIGH (ref 80.0–100.0)
Platelets: 91 10*3/uL — ABNORMAL LOW (ref 150–400)
RBC: 3.03 MIL/uL — ABNORMAL LOW (ref 4.22–5.81)
RDW: 16.8 % — ABNORMAL HIGH (ref 11.5–15.5)
WBC: 4.7 10*3/uL (ref 4.0–10.5)
nRBC: 0 % (ref 0.0–0.2)

## 2022-05-13 LAB — BASIC METABOLIC PANEL
Anion gap: 3 — ABNORMAL LOW (ref 5–15)
BUN: 12 mg/dL (ref 6–20)
CO2: 31 mmol/L (ref 22–32)
Calcium: 7.6 mg/dL — ABNORMAL LOW (ref 8.9–10.3)
Chloride: 101 mmol/L (ref 98–111)
Creatinine, Ser: 0.77 mg/dL (ref 0.61–1.24)
GFR, Estimated: >60 mL/min (ref >60)
Glucose, Bld: 104 mg/dL — ABNORMAL HIGH (ref 70–99)
Potassium: 4 mmol/L (ref 3.5–5.1)
Sodium: 135 mmol/L (ref 135–145)

## 2022-05-13 LAB — MAGNESIUM: Magnesium: 2 mg/dL (ref 1.7–2.4)

## 2022-05-13 MED ORDER — ALBUMIN HUMAN 25 % IV SOLN
25.0000 g | Freq: Once | INTRAVENOUS | Status: AC
  Administered 2022-05-13: 25 g via INTRAVENOUS
  Filled 2022-05-13: qty 100

## 2022-05-13 NOTE — Assessment & Plan Note (Signed)
-   See above alcoholic cirrhosis.

## 2022-05-13 NOTE — Discharge Summary (Signed)
Physician Discharge Summary  Marco Spears VXB:[id number] DOB: 09/01/1976 DOA: 05/05/2022  PCP: Pcp, No  Admit date: 05/05/2022 Discharge date: 05/13/2022  Time spent: 60 minutes  Recommendations for Outpatient Follow-up:  Follow-up with PCP in 1 week.  On follow-up patient will need a basic metabolic profile done to follow-up on electrolytes and renal function.  Patient will need a magnesium level checked.  Patient need a CBC done to follow-up on counts. Follow-up with Dr. Mack Guise for follow-up on prepatellar bursitis. Follow-up with Dr. Ramon Dredge, ID in 2 weeks.   Discharge Diagnoses:  Principal Problem:   Cellulitis Active Problems:   Sepsis ([place])   Alcoholic cirrhosis of liver with ascites ([facility])   Anemia   Hypokalemia   Cellulitis of right leg   Cirrhosis of liver without ascites ([place])   Polysubstance abuse ([place])   Discharge Condition: Stable and improved  Diet recommendation: Heart healthy  Filed Weights   05/05/22 1541 05/07/22 1006  Weight: 104.3 kg 103.9 kg    History of present illness:  HPI per Dr. Elana Alm is a 46 y.o. male seen in ed with complaints of  rt leg pain and swelling that started after a bit he thinks , although says it is not snakebite as he would have remembered that. The rt leg is red and inflamed from groin down  with lymphadenopathy on ct.pt is alert/ oriented and states it just happened today. No other complaints of fevers or chills.   Patient does not report any headaches blurred vision chest pain shortness of breath palpitations abdominal pain nausea vomiting diarrhea constipation.     Pt has past medical history of allergy to amoxicillin, history of alcoholic cirrhosis of the liver, homelessness.    Hospital Course:   Assessment and Plan: * Cellulitis Patient presented with extensive right leg cellulitis, due to, severity and extent patient started on triple antibiotic regimen with p.o. clindamycin. -Due to concern for  septic bursitis orthopedics consulted. -MRI of the right knee showed diffuse mild subcutaneous soft tissue swelling/edema and skin thickening consistent with cellulitis, small focal fluid collection in the region of the prepatellar bursa could reflect bursitis could not exclude septic bursitis.  No findings suspicious for septic arthritis or osteomyelitis.  Very small knee joint effusion and 1 small air collection possibly recent joint aspiration.  Mild/early myositis but no findings for pyomyositis.  Incidental medial meniscus tear. -Patient seen by orthopedics and patient underwent irrigation and drainage of right knee prepatellar bursitis. -Deep surgical wound cultures grew group A strep. -ID consulted and antibiotics changed to IV cefazolin with resolution of sepsis and clinical improvement. -Patient will be discharged on 2 weeks of Keflex 500 mg every 6 hours until 05/28/2022 per ID. -Outpatient follow-up with ID and orthopedics. We will follow cultures and sensitivity. As needed Tylenol for pain medications.   Sepsis [facility]) Patient met sepsis criteria with tachycardia, tachypnea, acute elevated lactic acid level.  -Patient placed on IV fluids.  -See above #1 cellulitis.  -Sepsis had resolved by day of discharge.    Anemia    Latest Ref Rng & Units 05/05/2022    4:32 PM 04/22/2022    6:10 AM 04/21/2022    6:20 AM  CBC  WBC 4.0 - 10.5 K/uL 5.5  2.4  2.8   Hemoglobin 13.0 - 17.0 g/dL 10.8  9.7  9.7   Hematocrit 39.0 - 52.0 % 31.5  28.7  27.9   Platelets 150 - 400 K/uL 51  49  54  Suspect anemia of chronic disease, alcoholism, possible iron deficiency and chronic blood loss from gastritis and upper GI issues. We will follow CBC and type and screen and transfuse as indicated. -Patient initially placed on IV PPI and subsequently transition to oral PPI. -Hemoglobin remained stable at 10.5 by day of discharge.   Alcoholic cirrhosis of liver with ascites ([place]) -Patient noted with  history of chronic cirrhosis of the liver alcoholic/history of hepatitis C treated/pancytopenia/anasarca/?  Ascites.   -Patient received IV Lasix during the hospitalization and resumed back on home regimen spironolactone.   -Patient also placed on lactulose initially, ammonia levels within normal limits.   -Patient initially placed on low-dose KCl while on IV Lasix.  -Patient -5.465 L during the hospitalization.  -Patient subsequently transition to oral Lasix 40 twice daily as well as spinal lactone 100 mg daily.   -Patient underwent ultrasound-guided paracentesis with 6 L of clear yellow fluid removed on 05/13/2022 without any complications.   -Outpatient follow-up with PCP.   -We will need repeat labs 1 week to follow-up on electrolytes and renal function.   -Patient maintained on Ativan withdrawal protocol during the hospitalization.  Hypokalemia - Repleted during the hospitalization. -We will need repeat labs done in 1 week.  Polysubstance abuse ([place]) - Polysubstance cessation stressed to patient. -UDS was positive for cannabinoid and amphetamines. -Alcohol level was negative. -Patient denied any IV drug use.  Cirrhosis of liver without ascites ([place]) - See above alcoholic cirrhosis.        Procedures: CT right femur 05/05/2022 CT right foot 05/05/2022 CT tib-fib 05/05/2022 MRI right knee 05/06/2022 Ultrasound-guided paracentesis 05/13/2022--6 L clear yellow fluid removed per IR, Dr. Pascal Lux Right lower extremity Doppler ultrasound 05/05/2022 Incision and drainage prepatellar bursa, right knee per orthopedics Dr. Mack Guise 05/07/2022   Consultations: General surgery: Dr. Dahlia Byes 05/06/2022 Orthopedics: Dr. Mack Guise 05/06/2022 Infectious disease: Dr. Ramon Dredge 05/07/2022  Discharge Exam: Vitals:   05/13/22 0751 05/13/22 0900  BP: 104/63 116/72  Pulse: 79 83  Resp: 18   Temp: 98.9 F (37.2 C)   SpO2: 94% 95%    General: NAD Cardiovascular: Regular rate rhythm no murmurs  rubs or gallops.  No JVD.  No lower extremity edema. Respiratory: Clear to auscultation bilaterally.  No wheezes, no crackles, no rhonchi.  Discharge Instructions   Discharge Instructions     Diet - low sodium heart healthy   Complete by: As directed    Discharge wound care:   Complete by: As directed    Per orthopedics.   Increase activity slowly   Complete by: As directed       Allergies as of 05/13/2022       Reactions   Amoxicillin Rash   States it makes his skin turn red        Medication List     STOP taking these medications    Constulose 10 GM/15ML solution Generic drug: lactulose   magic mouthwash (nystatin, hydrocortisone, diphenhydrAMINE) suspension       TAKE these medications    B-complex with vitamin C tablet Take 1 tablet by mouth daily.   cephALEXin 500 MG capsule Commonly known as: KEFLEX Take 1 capsule (500 mg total) by mouth 4 (four) times daily for 16 days.   Eliquis 2.5 MG Tabs tablet Generic drug: apixaban Take 1 tablet (2.5 mg total) by mouth 2 (two) times daily for 10 days.   FeroSul 325 (65 FE) MG tablet Generic drug: ferrous sulfate Take 1 tablet (325 mg total) by mouth daily  with breakfast.   folic acid 1 MG tablet Commonly known as: FOLVITE Take 1 tablet (1 mg total) by mouth daily.   furosemide 40 MG tablet Commonly known as: LASIX Take 1 tablet (40 mg total) by mouth 2 (two) times daily. What changed: when to take this   multivitamin with minerals Tabs tablet Take 1 tablet by mouth daily.   spironolactone 50 MG tablet Commonly known as: ALDACTONE Take 2 tablets (100 mg total) by mouth daily. What changed: medication strength               Durable Medical Equipment  (From admission, onward)           Start     Ordered   05/12/22 1026  For home use only DME Walker rolling  Once       Question Answer Comment  Walker: With [place] Wheels   Patient needs a walker to treat with the following condition  Weakness      05/12/22 1026   05/10/22 2248  For home use only DME Walker rolling  Once       Question Answer Comment  Walker: With [place] Wheels   Patient needs a walker to treat with the following condition Generalized weakness      05/10/22 2247              Discharge Care Instructions  (From admission, onward)           Start     Ordered   05/13/22 0000  Discharge wound care:       Comments: Per orthopedics.   05/13/22 1442           Allergies  Allergen Reactions   Amoxicillin Rash    States it makes his skin turn red    Follow-up Information     [place], MD. Schedule an appointment as soon as possible for a visit.   Specialty: Orthopedic Surgery Why: right knee pre-patellar bursitis--f/u Contact information: [place] [address] [phone]         PCP. Schedule an appointment as soon as possible for a visit in 1 week(s).          Tsosie Billing, MD. Schedule an appointment as soon as possible for a visit in 2 week(s).   Specialty: Infectious Diseases Contact information: [place] [address] [phone]                  The results of significant diagnostics from this hospitalization (including imaging, microbiology, ancillary and laboratory) are listed below for reference.    Significant Diagnostic Studies: US Paracentesis  Result Date: 05/13/2022 INDICATION: Patient with history of alcoholic cirrhosis and recurrent ascites request received for therapeutic paracentesis. Per patient, he has had paracentesis performed at outside facilities previously. EXAM: ULTRASOUND GUIDED PARACENTESIS MEDICATIONS: Local 1% lidocaine only. COMPLICATIONS: None immediate. PROCEDURE: Informed written consent was obtained from the patient after a discussion of the risks, benefits and alternatives to treatment. A timeout was performed prior to the initiation of the procedure. Initial  ultrasound scanning demonstrates a large amount of ascites within the left upper abdominal quadrant. The left upper abdomen was prepped and draped in the usual sterile fashion. 1% lidocaine was used for local anesthesia. Following this, a 19 gauge, 7-cm, Yueh catheter was introduced. An ultrasound image was saved for documentation purposes. The paracentesis was performed. The catheter was removed and a dressing was applied. The patient tolerated the  procedure well without immediate post procedural complication. FINDINGS: A total of approximately 6 L of clear yellow fluid was removed. IMPRESSION: Successful ultrasound-guided paracentesis yielding 6 liters of peritoneal fluid. PLAN: If the patient eventually requires >/=2 paracenteses in a 30 day period, candidacy for formal evaluation by the [facility] will be assessed. This exam was performed by Tsosie Billing PA-C, and was supervised and interpreted by Dr. Pascal Lux. Electronically Signed   By: Sandi Mariscal M.D.   On: 05/13/2022 14:28   MR KNEE RIGHT WO CONTRAST  Result Date: 05/07/2022 CLINICAL DATA:  Swelling/inflammation. EXAM: MRI OF THE RIGHT KNEE WITHOUT CONTRAST TECHNIQUE: Multiplanar, multisequence MR imaging of the knee was performed. No intravenous contrast was administered. COMPARISON:  CT scan 05/05/2022 FINDINGS: Diffuse and marked subcutaneous soft tissue swelling/edema and skin thickening consistent with cellulitis. Small focal fluid collection measuring 2 cm in the region of the prepatellar bursa could reflect bursitis. Could not exclude septic bursitis. Very small joint effusion with a small amount of air likely from a joint aspiration. Recommend clinical correlation. No findings suspicious for septic arthritis or osteomyelitis. Mild edema like signal changes in the anterior and posterior compartment musculature suggesting mild/early myositis but no findings for pyomyositis. Mild knee joint  degenerative changes with degenerative chondrosis. Posterior horn medial meniscus tears noted. The knee ligaments are intact. IMPRESSION: 1. Diffuse and marked subcutaneous soft tissue swelling/edema and skin thickening consistent with cellulitis. 2. Small focal fluid collection in the region of the prepatellar bursa could reflect bursitis. Could not exclude septic bursitis. 3. No findings suspicious for septic arthritis or osteomyelitis. 4. Very small knee joint effusion and 1 small air collection possibly recent joint aspiration. 5. Mild/early myositis but no findings for pyomyositis. 6. Incidental medial meniscus tear Electronically Signed   By: Marijo Sanes M.D.   On: 05/07/2022 07:18   CT FEMUR RIGHT W CONTRAST  Result Date: 05/05/2022 CLINICAL DATA:  Redness/swelling to right leg EXAM: CT OF THE LOWER RIGHT EXTREMITY WITH CONTRAST CT OF THE RIGHT TIBIA AND FIBULA WITH CONTRAST CT OF THE RIGHT FOOT WITH CONTRAST TECHNIQUE: Multidetector CT imaging of the lower right extremity was performed according to the standard protocol following intravenous contrast administration. RADIATION DOSE REDUCTION: This exam was performed according to the departmental dose-optimization program which includes automated exposure control, adjustment of the mA and/or kV according to patient size and/or use of iterative reconstruction technique. CONTRAST:  117m OMNIPAQUE IOHEXOL 300 MG/ML  SOLN COMPARISON:  Right lower extremity venous Doppler dated 05/05/2022 FINDINGS: CT of the right lower extremity performed, extending from the level of the lower pelvis through the right foot. Visualized pelvis is notable for moderate pelvic ascites and right inguinal nodes measuring up to 19 mm short axis (series 8/image 99), likely reactive. No evidence of DVT. Visualized osseous structures are within normal limits. No fracture is seen. No cortical destruction to suggest osteomyelitis. No drainable fluid collection/abscess. Mild subcutaneous  edema along the anterior upper thigh. Additional subcutaneous edema along the calf, more prominent laterally. Mild subcutaneous edema with skin thickening along the dorsal foot. These findings correspond to the clinical appearance of cellulitis. No intramuscular abnormality. IMPRESSION: Cellulitis along the right lower extremity, as above. No drainable fluid collection/abscess. No evidence of osteomyelitis. Prominent right inguinal nodes, likely reactive. Moderate pelvic ascites, incompletely visualized. Electronically Signed   By: SJulian HyM.D.   On: 05/05/2022 21:15   CT TIBIA FIBULA RIGHT W CONTRAST  Result Date: 05/05/2022 CLINICAL DATA:  Redness/swelling to right leg EXAM: CT OF THE LOWER RIGHT EXTREMITY WITH CONTRAST CT OF THE RIGHT TIBIA AND FIBULA WITH CONTRAST CT OF THE RIGHT FOOT WITH CONTRAST TECHNIQUE: Multidetector CT imaging of the lower right extremity was performed according to the standard protocol following intravenous contrast administration. RADIATION DOSE REDUCTION: This exam was performed according to the departmental dose-optimization program which includes automated exposure control, adjustment of the mA and/or kV according to patient size and/or use of iterative reconstruction technique. CONTRAST:  159m OMNIPAQUE IOHEXOL 300 MG/ML  SOLN COMPARISON:  Right lower extremity venous Doppler dated 05/05/2022 FINDINGS: CT of the right lower extremity performed, extending from the level of the lower pelvis through the right foot. Visualized pelvis is notable for moderate pelvic ascites and right inguinal nodes measuring up to 19 mm short axis (series 8/image 99), likely reactive. No evidence of DVT. Visualized osseous structures are within normal limits. No fracture is seen. No cortical destruction to suggest osteomyelitis. No drainable fluid collection/abscess. Mild subcutaneous edema along the anterior upper thigh. Additional subcutaneous edema along the calf, more prominent  laterally. Mild subcutaneous edema with skin thickening along the dorsal foot. These findings correspond to the clinical appearance of cellulitis. No intramuscular abnormality. IMPRESSION: Cellulitis along the right lower extremity, as above. No drainable fluid collection/abscess. No evidence of osteomyelitis. Prominent right inguinal nodes, likely reactive. Moderate pelvic ascites, incompletely visualized. Electronically Signed   By: SJulian HyM.D.   On: 05/05/2022 21:15   CT FOOT RIGHT W CONTRAST  Result Date: 05/05/2022 CLINICAL DATA:  Redness/swelling to right leg EXAM: CT OF THE LOWER RIGHT EXTREMITY WITH CONTRAST CT OF THE RIGHT TIBIA AND FIBULA WITH CONTRAST CT OF THE RIGHT FOOT WITH CONTRAST TECHNIQUE: Multidetector CT imaging of the lower right extremity was performed according to the standard protocol following intravenous contrast administration. RADIATION DOSE REDUCTION: This exam was performed according to the departmental dose-optimization program which includes automated exposure control, adjustment of the mA and/or kV according to patient size and/or use of iterative reconstruction technique. CONTRAST:  1262mOMNIPAQUE IOHEXOL 300 MG/ML  SOLN COMPARISON:  Right lower extremity venous Doppler dated 05/05/2022 FINDINGS: CT of the right lower extremity performed, extending from the level of the lower pelvis through the right foot. Visualized pelvis is notable for moderate pelvic ascites and right inguinal nodes measuring up to 19 mm short axis (series 8/image 99), likely reactive. No evidence of DVT. Visualized osseous structures are within normal limits. No fracture is seen. No cortical destruction to suggest osteomyelitis. No drainable fluid collection/abscess. Mild subcutaneous edema along the anterior upper thigh. Additional subcutaneous edema along the calf, more prominent laterally. Mild subcutaneous edema with skin thickening along the dorsal foot. These findings correspond to the  clinical appearance of cellulitis. No intramuscular abnormality. IMPRESSION: Cellulitis along the right lower extremity, as above. No drainable fluid collection/abscess. No evidence of osteomyelitis. Prominent right inguinal nodes, likely reactive. Moderate pelvic ascites, incompletely visualized. Electronically Signed   By: SrJulian Hy.D.   On: 05/05/2022 21:15   [place] Img Lower Unilateral Right  Result Date: 05/05/2022 CLINICAL DATA:  RLE pain, swelling, erythema EXAM: RIGHT LOWER EXTREMITY VENOUS DOPPLER ULTRASOUND TECHNIQUE: Gray-scale sonography with compression, as well as color and duplex ultrasound, were performed to evaluate the deep venous system(s) from the level of the common femoral vein through the popliteal and proximal calf veins. COMPARISON:  None Available. FINDINGS: VENOUS Normal compressibility of the common femoral, superficial femoral, and popliteal veins, as well as the  visualized calf veins. Visualized portions of profunda femoral vein and great saphenous vein unremarkable. No filling defects to suggest DVT on grayscale or color Doppler imaging. Doppler waveforms show normal direction of venous flow, normal respiratory plasticity and response to augmentation. Limited views of the contralateral common femoral vein are unremarkable. Other: Incidental lymph node identified in the right groin, which measures 4 x 1.4 cm. This node has a fatty hilum. IMPRESSION: No evidence of DVT in the right lower extremity. Electronically Signed   By: Margaretha Sheffield M.D.   On: 05/05/2022 18:12   ECHOCARDIOGRAM COMPLETE  Result Date: 04/21/2022    ECHOCARDIOGRAM REPORT   Patient Name:   WIRT HEMMERICH Date of Exam: 04/21/2022 Medical Rec #:  [id number]       Height:       75.0 in Accession #:    [id number]      Weight:       233.0 lb Date of Birth:  06-11-1976       BSA:          2.342 m Patient Age:    47 years        BP:           116/63 mmHg Patient Gender: M               HR:            89 bpm. Exam Location:  [facility] Procedure: 2D Echo, Color Doppler and Cardiac Doppler Indications:     Dyspnea R06.00  History:         Patient has no prior history of Echocardiogram examinations.                  Signs/Symptoms:Murmur.  Sonographer:     Sherrie Sport Referring Phys:  [id number] SARA-MAIZ A THOMAS Diagnosing Phys: Nelva Bush MD  Sonographer Comments: Suboptimal apical window. IMPRESSIONS  1. Left ventricular ejection fraction, by estimation, is 65 to 70%. The left ventricle has normal function. The left ventricle has no regional wall motion abnormalities. Left ventricular diastolic parameters were normal.  2. Right ventricular systolic function is normal. The right ventricular size is normal. Tricuspid regurgitation signal is inadequate for assessing PA pressure.  3. The mitral valve is normal in structure. Trivial mitral valve regurgitation. No evidence of mitral stenosis.  4. The aortic valve has an indeterminant number of cusps. There is mild thickening of the aortic valve. Aortic valve regurgitation is not visualized. Aortic valve sclerosis is present, with no evidence of aortic valve stenosis. FINDINGS  Left Ventricle: Left ventricular ejection fraction, by estimation, is 65 to 70%. The left ventricle has normal function. The left ventricle has no regional wall motion abnormalities. The left ventricular internal cavity size was normal in size. There is  borderline left ventricular hypertrophy. Left ventricular diastolic parameters were normal. Right Ventricle: The right ventricular size is normal. No increase in right ventricular wall thickness. Right ventricular systolic function is normal. Tricuspid regurgitation signal is inadequate for assessing PA pressure. Left Atrium: Left atrial size was normal in size. Right Atrium: Right atrial size was normal in size. Pericardium: The pericardium was not well visualized. Mitral Valve: The mitral valve is normal in structure. Trivial mitral valve  regurgitation. No evidence of mitral valve stenosis. Tricuspid Valve: The tricuspid valve is normal in structure. Tricuspid valve regurgitation is trivial. Aortic Valve: The aortic valve has an indeterminant number of cusps. There is mild thickening of the aortic valve. Aortic valve regurgitation is  not visualized. Aortic valve sclerosis is present, with no evidence of aortic valve stenosis. Aortic valve mean gradient measures 8.0 mmHg. Aortic valve peak gradient measures 12.8 mmHg. Aortic valve area, by VTI measures 3.67 cm. Pulmonic Valve: The pulmonic valve was normal in structure. Pulmonic valve regurgitation is not visualized. No evidence of pulmonic stenosis. Aorta: The aortic root and ascending aorta are structurally normal, with no evidence of dilitation. Pulmonary Artery: The pulmonary artery is of normal size. Venous: The inferior vena cava was not well visualized. IAS/Shunts: The interatrial septum was not well visualized.  LEFT VENTRICLE PLAX 2D LVIDd:         4.20 cm   Diastology LVIDs:         3.00 cm   LV e' medial:    9.14 cm/s LV PW:         1.10 cm   LV E/e' medial:  12.1 LV IVS:        1.20 cm   LV e' lateral:   15.40 cm/s LVOT diam:     2.10 cm   LV E/e' lateral: 7.2 LV SV:         118 LV SV Index:   50 LVOT Area:     3.46 cm  RIGHT VENTRICLE RV Basal diam:  2.80 cm RV S prime:     18.30 cm/s TAPSE (M-mode): 2.3 cm LEFT ATRIUM           Index        RIGHT ATRIUM           Index LA diam:      4.10 cm 1.75 cm/m   RA Area:     11.12 cm LA Vol (A4C): 67.6 ml 28.87 ml/m  RA Volume:   24.77 ml  10.58 ml/m  AORTIC VALVE AV Area (Vmax):    3.23 cm AV Area (Vmean):   3.09 cm AV Area (VTI):     3.67 cm AV Vmax:           179.00 cm/s AV Vmean:          131.000 cm/s AV VTI:            0.322 m AV Peak Grad:      12.8 mmHg AV Mean Grad:      8.0 mmHg LVOT Vmax:         167.00 cm/s LVOT Vmean:        117.000 cm/s LVOT VTI:          0.341 m LVOT/AV VTI ratio: 1.06  AORTA Ao Root diam: 3.20 cm MITRAL  VALVE MV Area (PHT): 2.85 cm     SHUNTS MV Decel Time: 266 msec     Systemic VTI:  0.34 m MV E velocity: 111.00 cm/s  Systemic Diam: 2.10 cm MV A velocity: 123.00 cm/s MV E/A ratio:  0.90 Harrell Gave End MD Electronically signed by Nelva Bush MD Signature Date/Time: 04/21/2022/10:29:08 AM    Final    CT Head Wo Contrast  Result Date: 04/20/2022 CLINICAL DATA:  Mental status change with unknown cause. Leg swelling and right upper quadrant pain. Recent drug abuse. EXAM: CT HEAD WITHOUT CONTRAST TECHNIQUE: Contiguous axial images were obtained from the base of the skull through the vertex without intravenous contrast. RADIATION DOSE REDUCTION: This exam was performed according to the departmental dose-optimization program which includes automated exposure control, adjustment of the mA and/or kV according to patient size and/or use of iterative reconstruction technique. COMPARISON:  None Available. FINDINGS:  Brain: Encephalomalacia in the left more than right anterior and inferior frontal lobes and in the medial left temporal lobe, a posttraumatic pattern. No evidence of acute infarct, hemorrhage, hydrocephalus, or mass. Vascular: No hyperdense vessel or unexpected calcification. Skull: Normal. Negative for fracture or focal lesion. Sinuses/Orbits: No acute finding. IMPRESSION: 1. No acute finding. 2. 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 Electronically Signed   By: Jorje Guild M.D.   On: 04/20/2022 05:06   US Venous Img Lower Bilateral (DVT)  Result Date: 04/20/2022 CLINICAL DATA:  Peripheral swelling for 1 day EXAM: BILATERAL LOWER EXTREMITY VENOUS DOPPLER ULTRASOUND TECHNIQUE: Gray-scale sonography with graded compression, as well as color Doppler and duplex ultrasound were performed to evaluate the lower extremity deep venous systems from the level of the common femoral vein and including the common femoral, femoral, profunda femoral,  popliteal and calf veins including the posterior tibial, peroneal and gastrocnemius veins when visible. The superficial great saphenous vein was also interrogated. Spectral Doppler was utilized to evaluate flow at rest and with distal augmentation maneuvers in the common femoral, femoral and popliteal veins. COMPARISON:  None Available. FINDINGS: RIGHT LOWER EXTREMITY Common Femoral Vein: No evidence of thrombus. Normal compressibility, respiratory phasicity and response to augmentation. Saphenofemoral Junction: No evidence of thrombus. Normal compressibility and flow on color Doppler imaging. Profunda Femoral Vein: No evidence of thrombus. Normal compressibility and flow on color Doppler imaging. Femoral Vein: No evidence of thrombus. Normal compressibility, respiratory phasicity and response to augmentation. Popliteal Vein: No evidence of thrombus. Normal compressibility, respiratory phasicity and response to augmentation. Calf Veins: No evidence of thrombus. Normal compressibility and flow on color Doppler imaging. Superficial Great Saphenous Vein: No evidence of thrombus. Normal compressibility. Venous Reflux:  None. Other Findings:  None. LEFT LOWER EXTREMITY Common Femoral Vein: No evidence of thrombus. Normal compressibility, respiratory phasicity and response to augmentation. Saphenofemoral Junction: No evidence of thrombus. Normal compressibility and flow on color Doppler imaging. Profunda Femoral Vein: No evidence of thrombus. Normal compressibility and flow on color Doppler imaging. Femoral Vein: No evidence of thrombus. Normal compressibility, respiratory phasicity and response to augmentation. Popliteal Vein: No evidence of thrombus. Normal compressibility, respiratory phasicity and response to augmentation. Calf Veins: No evidence of thrombus. Normal compressibility and flow on color Doppler imaging. Superficial Great Saphenous Vein: No evidence of thrombus. Normal compressibility. Venous Reflux:   None. Other Findings:  None. IMPRESSION: No evidence of deep venous thrombosis in either lower extremity. Electronically Signed   By: Inez Catalina M.D.   On: 04/20/2022 02:37   US ABDOMEN LIMITED RUQ (LIVER/GB)  Result Date: 04/20/2022 CLINICAL DATA:  History of cirrhosis EXAM: ULTRASOUND ABDOMEN LIMITED RIGHT UPPER QUADRANT COMPARISON:  None Available. FINDINGS: Gallbladder: Cholelithiasis is noted without wall thickening or pericholecystic fluid. Common bile duct: Diameter: 3 mm Liver: Increased echogenicity is noted consistent with the given clinical history of cirrhosis. No focal mass is noted. Portal vein is patent on color Doppler imaging with normal direction of blood flow towards the liver. Other: Mild ascites is noted in the left upper quadrant IMPRESSION: Changes of cirrhosis of the liver with mild ascites. Cholelithiasis without complicating factors. Electronically Signed   By: Inez Catalina M.D.   On: 04/20/2022 02:33    Microbiology: Recent Results (from the past 240 hour(s))  Culture, blood (routine x 2)     Status: None   Collection Time: 05/05/22  4:31 PM   Specimen: BLOOD  Result Value Ref Range  Status   Specimen Description BLOOD RIGHT ANTECUBITAL  Final   Special Requests   Final    BOTTLES DRAWN AEROBIC AND ANAEROBIC Blood Culture adequate volume   Culture   Final    NO GROWTH 5 DAYS Performed at [facility], [address] [place], [place] [address]    Report Status 05/10/2022 FINAL  Final  Urine Culture     Status: None   Collection Time: 05/05/22  4:32 PM   Specimen: Urine, Clean Catch  Result Value Ref Range Status   Specimen Description   Final    URINE, CLEAN CATCH Performed at [facility], [address], [place], [place] [address]    Special Requests   Final    NONE Performed at [facility], [address] [place], [place] [address]    Culture   Final    NO GROWTH Performed at [facility], [place] [address]  [place], [place] [address]    Report Status 05/06/2022 FINAL  Final  Culture, blood (routine x 2)     Status: None   Collection Time: 05/05/22  4:34 PM   Specimen: BLOOD  Result Value Ref Range Status   Specimen Description BLOOD BLOOD LEFT HAND  Final   Special Requests   Final    BOTTLES DRAWN AEROBIC AND ANAEROBIC Blood Culture adequate volume   Culture   Final    NO GROWTH 5 DAYS Performed at [facility], [address] [place], [place] [address]    Report Status 05/10/2022 FINAL  Final  Body fluid culture w Gram Stain     Status: None   Collection Time: 05/06/22  7:30 PM   Specimen: KNEE; Body Fluid  Result Value Ref Range Status   Specimen Description   Final    KNEE RIGHT Performed at [facility], [address] [place], [place] [address]    Special Requests   Final    Normal Performed at [facility], [place] [place], [place] [address]    Gram Stain   Final    NO SQUAMOUS EPITHELIAL CELLS SEEN NO WBC SEEN NO ORGANISMS SEEN    Culture   Final    NO GROWTH 3 DAYS Performed at [facility], [place] [address], [place], [place] [address]    Report Status 05/10/2022 FINAL  Final  Surgical pcr screen     Status: Abnormal   Collection Time: 05/07/22  9:17 AM   Specimen: Nasal Mucosa; Nasal Swab  Result Value Ref Range Status   MRSA, PCR NEGATIVE NEGATIVE Final   Staphylococcus aureus POSITIVE (A) NEGATIVE Final    Comment: (NOTE) The Xpert SA Assay (FDA approved for NASAL specimens in patients 64 years of age and older), is one component of a comprehensive surveillance program. It is not intended to diagnose infection nor to guide or monitor treatment. Performed at [facility], [place],  [address]   Aerobic/Anaerobic Culture w Gram Stain (surgical/deep wound)     Status: None   Collection Time: 05/07/22 11:14 AM   Specimen: PATH Other; Tissue  Result Value Ref Range Status   Specimen  Description ABSCESS  Final   Special Requests RIGHT KNEE BURSA  Final   Gram Stain NO WBC SEEN NO ORGANISMS SEEN   Final   Culture   Final    RARE GROUP A STREP (S.PYOGENES) ISOLATED Beta hemolytic streptococci are predictably susceptible to penicillin and other beta lactams.  Susceptibility testing not routinely performed. NO ANAEROBES ISOLATED Performed at [facility], [place] [address] [place], [place] [address]    Report Status 05/12/2022 FINAL  Final  Aerobic/Anaerobic Culture w Gram Stain (surgical/deep wound)     Status: None   Collection Time: 05/07/22 11:18 AM   Specimen: PATH Other; Tissue  Result Value Ref Range Status   Specimen Description ABSCESS  Final   Special Requests RIGHT KNEE BURSA  Final   Gram Stain NO WBC SEEN NO ORGANISMS SEEN   Final   Culture   Final    No growth aerobically or anaerobically. Performed at [facility], [place] [address] [place], [place] [address]    Report Status 05/12/2022 FINAL  Final  Aerobic Culture w Gram Stain (superficial specimen)     Status: None   Collection Time: 05/07/22  9:18 PM   Specimen: KNEE; Wound  Result Value Ref Range Status   Specimen Description   Final    KNEE RIGHT Performed at [facility], [address], [place], [place] [address]    Special Requests   Final    NONE Performed at [facility], [address] [place], [place] [address]    Gram Stain   Final    NO SQUAMOUS EPITHELIAL CELLS SEEN NO WBC SEEN NO ORGANISMS SEEN    Culture   Final    NO GROWTH 2 DAYS Performed at [facility], [place] [address] [place],  [address]    Report Status 05/10/2022 FINAL  Final     Labs: Basic Metabolic Panel: Recent Labs  Lab 05/07/22 0401 05/07/22 1005 05/10/22 0624 05/13/22 1220  NA  --  134* 133* 135  K  --  3.4* 3.4* 4.0  CL  --  96* 99 101  CO2  --   --  28 31  GLUCOSE  --  109* 137* 104*  BUN  --  _0 CREATININE 0.79 0.60* 0.78 0.77  CALCIUM  --    --  7.5* 7.6*  MG  --   --   --  2.0   Liver Function Tests: No results for input(s): "AST", "ALT", "ALKPHOS", "BILITOT", "PROT", "ALBUMIN" in the last 168 hours. No results for input(s): "LIPASE", "AMYLASE" in the last 168 hours. No results for input(s): "AMMONIA" in the last 168 hours. CBC: Recent Labs  Lab 05/07/22 1005 05/11/22 0400 05/13/22 1220  WBC  --  6.2 4.7  HGB 9.5* 10.4* 10.5*  HCT 28.0* 30.2* 31.7*  MCV  --  100.0 104.6*  PLT  --  101* 91*   Cardiac Enzymes: No results for input(s): "CKTOTAL", "CKMB", "CKMBINDEX", "TROPONINI" in the last 168 hours. BNP: BNP (last 3 results) Recent Labs    04/19/22 2327  BNP 14.2    ProBNP (last 3 results) No results for input(s): "PROBNP" in the last 8760 hours.  CBG: No results for input(s): "GLUCAP" in the last 168 hours.     Signed:  Irine Seal MD.  [facility] 05/13/2022, 2:58 PM

## 2022-05-13 NOTE — Assessment & Plan Note (Signed)
-   Polysubstance cessation stressed to patient. -UDS was positive for cannabinoid and amphetamines. -Alcohol level was negative. -Patient denied any IV drug use.

## 2022-05-13 NOTE — Progress Notes (Signed)
PT Cancellation Note  Patient Details Name: Marco Spears MRN: [id number] DOB: 1976/08/30   Cancelled Treatment:    Reason Eval/Treat Not Completed: Patient declined to participate with PT services this session secondary to fatigue.  Will attempt to see pt at a future date/time as medically appropriate.     Ovidio Hanger PT, DPT 05/13/22, 2:37 PM

## 2022-05-13 NOTE — TOC Progression Note (Signed)
Transition of Care [facility]) - Progression Note    Patient Details  Name: Marco Spears MRN: [id number] Date of Birth: 1976/09/07  Transition of Care [facility]) CM/SW Contact  Caryn Section, RN Phone Number: 05/13/2022, 3:36 PM  Clinical Narrative:   Patient does not have Medicaid, as confirmed by Admissions here at [facility].  Patient will have to apply for Medicaid.  RNCM informed patient to report to DSS toapply for medicaid.  Patient requires a taxi voucher, to discharge to brother's residence [address] [place], [place].  [facility] contacted, but patient states he needs to be there by 1600, [facility] aware.  Hospitalist [facility],  Application must be complete prior to clinic seeing patient, application was provided to pateint.  RNCM attempted to reach [facility] for appointment, and left a message.  Patient states he is familiar with [facility] and believes he can get transportation there.  Patient has rolling walker to take with him, states he is not sure that he should use it.  RNCM emphasized the need for utilization to assist with leg healing.  Patient verbally understood.           Expected Discharge Plan and Services           Expected Discharge Date: 05/13/22                                     Social Determinants of Health (SDOH) Interventions    Readmission Risk Interventions    05/06/2022    2:26 PM  Readmission Risk Prevention Plan  Transportation Screening Complete  Medication Review [employer]) Complete

## 2022-05-13 NOTE — Procedures (Signed)
PROCEDURE SUMMARY:  Successful ultrasound guided paracentesis from the LUQ Yielded 6 L of clear yellow fluid.  No immediate complications.  The patient tolerated the procedure well.   Specimen was not sent for labs.  EBL <  14mL  If the patient eventually requires >/=2 paracenteses in a 30 day period, screening evaluation by the [facility] Interventional Radiology Portal Hypertension Clinic will be assessed.  Berneta Levins, PA-C 05/13/2022, 10:49 AM

## 2022-05-13 NOTE — Progress Notes (Signed)
Subjective:  POD #6 s/p I&D of the right knee septic prepatellar bursitis.   Patient reports right knee pain as mild.    Objective:   VITALS:   Vitals:   05/12/22 2123 05/13/22 0407 05/13/22 0751 05/13/22 0900  BP: 118/68 110/67 104/63 116/72  Pulse: 94 94 79 83  Resp: 20 18 18    Temp: 99 F (37.2 C) 98.1 F (36.7 C) 98.9 F (37.2 C)   TempSrc:   Oral   SpO2: 95% 94% 94% 95%  Weight:      Height:        PHYSICAL EXAM: Right lower extremity: Erythema and swelling of the right knee is dramatically improved.  Patient can painlessly actively flex and extend his knee.  Patient does not have any knee effusion.  The erythema over his right anterior thigh has also dramatically improved.  Distally is neurovascular intact.  He has minimal tenderness to palpation over the anterior knee.   LABS  Results for orders placed or performed during the hospital encounter of 05/05/22 (from the past 24 hour(s))  Basic metabolic panel     Status: Abnormal   Collection Time: 05/13/22 12:20 PM  Result Value Ref Range   Sodium 135 135 - 145 mmol/L   Potassium 4.0 3.5 - 5.1 mmol/L   Chloride 101 98 - 111 mmol/L   CO2 31 22 - 32 mmol/L   Glucose, Bld 104 (H) 70 - 99 mg/dL   BUN 12 6 - 20 mg/dL   Creatinine, Ser 05/15/22 0.61 - 1.24 mg/dL   Calcium 7.6 (L) 8.9 - 10.3 mg/dL   GFR, Estimated 2.99 >24 mL/min   Anion gap 3 (L) 5 - 15  Magnesium     Status: None   Collection Time: 05/13/22 12:20 PM  Result Value Ref Range   Magnesium 2.0 1.7 - 2.4 mg/dL  CBC     Status: Abnormal   Collection Time: 05/13/22 12:20 PM  Result Value Ref Range   WBC 4.7 4.0 - 10.5 K/uL   RBC 3.03 (L) 4.22 - 5.81 MIL/uL   Hemoglobin 10.5 (L) 13.0 - 17.0 g/dL   HCT 05/15/22 (L) 83.4 - 19.6 %   MCV 104.6 (H) 80.0 - 100.0 fL   MCH 34.7 (H) 26.0 - 34.0 pg   MCHC 33.1 30.0 - 36.0 g/dL   RDW 22.2 (H) 97.9 - 89.2 %   Platelets 91 (L) 150 - 400 K/uL   nRBC 0.0 0.0 - 0.2 %    11.9 Paracentesis  Result Date:  05/13/2022 INDICATION: Patient with history of alcoholic cirrhosis and recurrent ascites request received for therapeutic paracentesis. Per patient, he has had paracentesis performed at outside facilities previously. EXAM: ULTRASOUND GUIDED PARACENTESIS MEDICATIONS: Local 1% lidocaine only. COMPLICATIONS: None immediate. PROCEDURE: Informed written consent was obtained from the patient after a discussion of the risks, benefits and alternatives to treatment. A timeout was performed prior to the initiation of the procedure. Initial ultrasound scanning demonstrates a large amount of ascites within the left upper abdominal quadrant. The left upper abdomen was prepped and draped in the usual sterile fashion. 1% lidocaine was used for local anesthesia. Following this, a 19 gauge, 7-cm, Yueh catheter was introduced. An ultrasound image was saved for documentation purposes. The paracentesis was performed. The catheter was removed and a dressing was applied. The patient tolerated the procedure well without immediate post procedural complication. FINDINGS: A total of approximately 6 L of clear yellow fluid was removed. IMPRESSION: Successful ultrasound-guided paracentesis yielding 6  liters of peritoneal fluid. PLAN: If the patient eventually requires >/=2 paracenteses in a 30 day period, candidacy for formal evaluation by the [facility] Interventional Radiology Portal Hypertension Clinic will be assessed. This exam was performed by Pattricia Boss PA-C, and was supervised and interpreted by Dr. Grace Isaac. Electronically Signed   By: Simonne Come M.D.   On: 05/13/2022 14:28    Assessment/Plan: 6 Days Post-Op   Principal Problem:   Cellulitis Active Problems:   Alcoholic cirrhosis of liver with ascites (HCC)   Hypokalemia   Anemia   Sepsis (HCC)   Cellulitis of right leg   Cirrhosis of liver without ascites (HCC)   Polysubstance abuse (HCC)  Continue antibiotics per ID.  Continue Eliquis for DVT prophylaxis until  follow-up.  Patient may be discharged from an orthopedic standpoint, once cleared medically.  He will follow-up at [facility] in [place] in 7 to 10 days after discharge for suture removal and wound check.    Juanell Fairly , MD 05/13/2022, 3:01 PM

## 2022-05-15 ENCOUNTER — Ambulatory Visit: Payer: Self-pay

## 2022-05-15 NOTE — Telephone Encounter (Signed)
Called pt at number captured by [facility] [phone]. LMOM to call back on community line number.

## 2022-05-15 NOTE — Telephone Encounter (Signed)
Agent stated that at 7Am pt passed out, lost vision for 45 minutes and is having numbness in a limb.  Phone was still connected, but could not hear caller. Asked pt to call back no phone number listed.

## 2022-05-20 ENCOUNTER — Encounter: Payer: Self-pay | Admitting: Emergency Medicine

## 2022-05-20 DIAGNOSIS — M7989 Other specified soft tissue disorders: Secondary | ICD-10-CM | POA: Diagnosis present

## 2022-05-20 DIAGNOSIS — K149 Disease of tongue, unspecified: Secondary | ICD-10-CM | POA: Diagnosis present

## 2022-05-20 DIAGNOSIS — M71161 Other infective bursitis, right knee: Secondary | ICD-10-CM | POA: Diagnosis present

## 2022-05-20 DIAGNOSIS — Z5900 Homelessness unspecified: Secondary | ICD-10-CM

## 2022-05-20 DIAGNOSIS — K7460 Unspecified cirrhosis of liver: Secondary | ICD-10-CM | POA: Diagnosis present

## 2022-05-20 DIAGNOSIS — Z91199 Patient's noncompliance with other medical treatment and regimen due to unspecified reason: Secondary | ICD-10-CM

## 2022-05-20 DIAGNOSIS — Z91148 Patient's other noncompliance with medication regimen for other reason: Secondary | ICD-10-CM

## 2022-05-20 DIAGNOSIS — R188 Other ascites: Secondary | ICD-10-CM | POA: Diagnosis present

## 2022-05-20 DIAGNOSIS — F1510 Other stimulant abuse, uncomplicated: Secondary | ICD-10-CM | POA: Diagnosis present

## 2022-05-20 DIAGNOSIS — T8140XA Infection following a procedure, unspecified, initial encounter: Principal | ICD-10-CM | POA: Diagnosis present

## 2022-05-20 DIAGNOSIS — Z880 Allergy status to penicillin: Secondary | ICD-10-CM

## 2022-05-20 DIAGNOSIS — D696 Thrombocytopenia, unspecified: Secondary | ICD-10-CM | POA: Diagnosis present

## 2022-05-20 DIAGNOSIS — F1210 Cannabis abuse, uncomplicated: Secondary | ICD-10-CM | POA: Diagnosis present

## 2022-05-20 DIAGNOSIS — M1711 Unilateral primary osteoarthritis, right knee: Secondary | ICD-10-CM | POA: Diagnosis present

## 2022-05-20 DIAGNOSIS — Z7901 Long term (current) use of anticoagulants: Secondary | ICD-10-CM

## 2022-05-20 DIAGNOSIS — L03115 Cellulitis of right lower limb: Secondary | ICD-10-CM | POA: Diagnosis present

## 2022-05-20 DIAGNOSIS — B1920 Unspecified viral hepatitis C without hepatic coma: Secondary | ICD-10-CM | POA: Diagnosis present

## 2022-05-20 DIAGNOSIS — Z79899 Other long term (current) drug therapy: Secondary | ICD-10-CM

## 2022-05-20 DIAGNOSIS — E876 Hypokalemia: Secondary | ICD-10-CM | POA: Diagnosis present

## 2022-05-20 DIAGNOSIS — R011 Cardiac murmur, unspecified: Secondary | ICD-10-CM | POA: Diagnosis present

## 2022-05-20 DIAGNOSIS — T3696XA Underdosing of unspecified systemic antibiotic, initial encounter: Secondary | ICD-10-CM | POA: Diagnosis present

## 2022-05-20 DIAGNOSIS — B370 Candidal stomatitis: Secondary | ICD-10-CM | POA: Diagnosis present

## 2022-05-20 DIAGNOSIS — F17210 Nicotine dependence, cigarettes, uncomplicated: Secondary | ICD-10-CM | POA: Diagnosis present

## 2022-05-20 DIAGNOSIS — D649 Anemia, unspecified: Secondary | ICD-10-CM | POA: Diagnosis present

## 2022-05-20 LAB — COMPREHENSIVE METABOLIC PANEL
ALT: 44 U/L (ref 0–44)
AST: 65 U/L — ABNORMAL HIGH (ref 15–41)
Albumin: 2.5 g/dL — ABNORMAL LOW (ref 3.5–5.0)
Alkaline Phosphatase: 134 U/L — ABNORMAL HIGH (ref 38–126)
Anion gap: 4 — ABNORMAL LOW (ref 5–15)
BUN: 8 mg/dL (ref 6–20)
CO2: 26 mmol/L (ref 22–32)
Calcium: 7.6 mg/dL — ABNORMAL LOW (ref 8.9–10.3)
Chloride: 108 mmol/L (ref 98–111)
Creatinine, Ser: 0.74 mg/dL (ref 0.61–1.24)
GFR, Estimated: >60 mL/min (ref >60)
Glucose, Bld: 143 mg/dL — ABNORMAL HIGH (ref 70–99)
Potassium: 2.9 mmol/L — ABNORMAL LOW (ref 3.5–5.1)
Sodium: 138 mmol/L (ref 135–145)
Total Bilirubin: 3.2 mg/dL — ABNORMAL HIGH (ref 0.3–1.2)
Total Protein: 6.2 g/dL — ABNORMAL LOW (ref 6.5–8.1)

## 2022-05-20 LAB — CBC WITH DIFFERENTIAL/PLATELET
Abs Immature Granulocytes: 0.01 10*3/uL (ref 0.00–0.07)
Basophils Absolute: 0 10*3/uL (ref 0.0–0.1)
Basophils Relative: 1 %
Eosinophils Absolute: 0.2 10*3/uL (ref 0.0–0.5)
Eosinophils Relative: 4 %
HCT: 31.9 % — ABNORMAL LOW (ref 39.0–52.0)
Hemoglobin: 10.7 g/dL — ABNORMAL LOW (ref 13.0–17.0)
Immature Granulocytes: 0 %
Lymphocytes Relative: 16 %
Lymphs Abs: 0.6 10*3/uL — ABNORMAL LOW (ref 0.7–4.0)
MCH: 34.6 pg — ABNORMAL HIGH (ref 26.0–34.0)
MCHC: 33.5 g/dL (ref 30.0–36.0)
MCV: 103.2 fL — ABNORMAL HIGH (ref 80.0–100.0)
Monocytes Absolute: 0.3 10*3/uL (ref 0.1–1.0)
Monocytes Relative: 8 %
Neutro Abs: 2.8 10*3/uL (ref 1.7–7.7)
Neutrophils Relative %: 71 %
Platelets: 84 10*3/uL — ABNORMAL LOW (ref 150–400)
RBC: 3.09 MIL/uL — ABNORMAL LOW (ref 4.22–5.81)
RDW: 17 % — ABNORMAL HIGH (ref 11.5–15.5)
Smear Review: NORMAL
WBC: 3.9 10*3/uL — ABNORMAL LOW (ref 4.0–10.5)
nRBC: 0 % (ref 0.0–0.2)

## 2022-05-20 LAB — TROPONIN I (HIGH SENSITIVITY): Troponin I (High Sensitivity): 3 ng/L (ref <18)

## 2022-05-20 LAB — PROTIME-INR
INR: 1.7 — ABNORMAL HIGH (ref 0.8–1.2)
Prothrombin Time: 19.7 seconds — ABNORMAL HIGH (ref 11.4–15.2)

## 2022-05-20 LAB — CK: Total CK: 55 U/L (ref 49–397)

## 2022-05-20 LAB — LIPASE, BLOOD: Lipase: 40 U/L (ref 11–51)

## 2022-05-20 LAB — LACTIC ACID, PLASMA: Lactic Acid, Venous: 1.9 mmol/L (ref 0.5–1.9)

## 2022-05-20 LAB — APTT: aPTT: 36 seconds (ref 24–36)

## 2022-05-20 NOTE — ED Triage Notes (Addendum)
Pt arrived via [facility] from home with c/o hematuria x1 day. Pt takes Eliquis but has not since  . Pt with alcoholic cirrhosis of liver and last fluid draw was 7/11 during hospitalization (7L.) Pt sts he has fluid buildup in abdomen since. Abdomen is distended, open sores on bilateral arms, skin jaundiced.   Pt also sts he was to have right knee surgical site from 7/13 "looked at" and have sutures removed but sts he was in hospital and couldn't.   Right knee is red, draining and swollen.

## 2022-05-21 ENCOUNTER — Emergency Department: Payer: Self-pay | Admitting: Anesthesiology

## 2022-05-21 ENCOUNTER — Encounter: Payer: Self-pay | Admitting: Internal Medicine

## 2022-05-21 ENCOUNTER — Other Ambulatory Visit: Payer: Self-pay

## 2022-05-21 ENCOUNTER — Emergency Department: Payer: Self-pay

## 2022-05-21 ENCOUNTER — Encounter
Admission: EM | Disposition: A | Discharge status: Home / Self Care | Payer: Self-pay | Source: Home / Self Care | Attending: Internal Medicine

## 2022-05-21 ENCOUNTER — Inpatient Hospital Stay
Admission: EM | Admit: 2022-05-21 | Discharge: 2022-05-26 | DRG: 857 | Disposition: A | Discharge status: Home / Self Care | Payer: Self-pay | Attending: Internal Medicine | Admitting: Internal Medicine

## 2022-05-21 DIAGNOSIS — D696 Thrombocytopenia, unspecified: Secondary | ICD-10-CM

## 2022-05-21 DIAGNOSIS — E876 Hypokalemia: Secondary | ICD-10-CM | POA: Diagnosis present

## 2022-05-21 DIAGNOSIS — K7460 Unspecified cirrhosis of liver: Secondary | ICD-10-CM | POA: Diagnosis present

## 2022-05-21 DIAGNOSIS — F1010 Alcohol abuse, uncomplicated: Secondary | ICD-10-CM | POA: Diagnosis present

## 2022-05-21 DIAGNOSIS — D649 Anemia, unspecified: Secondary | ICD-10-CM | POA: Diagnosis present

## 2022-05-21 DIAGNOSIS — L03115 Cellulitis of right lower limb: Principal | ICD-10-CM

## 2022-05-21 DIAGNOSIS — K148 Other diseases of tongue: Secondary | ICD-10-CM | POA: Diagnosis present

## 2022-05-21 DIAGNOSIS — F1910 Other psychoactive substance abuse, uncomplicated: Secondary | ICD-10-CM | POA: Diagnosis present

## 2022-05-21 DIAGNOSIS — L0390 Cellulitis, unspecified: Secondary | ICD-10-CM | POA: Diagnosis present

## 2022-05-21 DIAGNOSIS — M7110 Other infective bursitis, unspecified site: Secondary | ICD-10-CM | POA: Diagnosis present

## 2022-05-21 DIAGNOSIS — Z7901 Long term (current) use of anticoagulants: Secondary | ICD-10-CM

## 2022-05-21 DIAGNOSIS — M009 Pyogenic arthritis, unspecified: Secondary | ICD-10-CM

## 2022-05-21 HISTORY — PX: IRRIGATION AND DEBRIDEMENT KNEE: SHX5185

## 2022-05-21 LAB — TROPONIN I (HIGH SENSITIVITY): Troponin I (High Sensitivity): 4 ng/L (ref <18)

## 2022-05-21 LAB — URINE DRUG SCREEN, QUALITATIVE (ARMC ONLY)
Amphetamines, Ur Screen: POSITIVE — AB
Barbiturates, Ur Screen: NOT DETECTED
Benzodiazepine, Ur Scrn: NOT DETECTED
Cannabinoid 50 Ng, Ur ~~LOC~~: POSITIVE — AB
Cocaine Metabolite,Ur ~~LOC~~: NOT DETECTED
MDMA (Ecstasy)Ur Screen: NOT DETECTED
Methadone Scn, Ur: NOT DETECTED
Opiate, Ur Screen: NOT DETECTED
Phencyclidine (PCP) Ur S: NOT DETECTED
Tricyclic, Ur Screen: NOT DETECTED

## 2022-05-21 LAB — URINALYSIS, ROUTINE W REFLEX MICROSCOPIC
Bacteria, UA: NONE SEEN
Bilirubin Urine: NEGATIVE
Glucose, UA: NEGATIVE mg/dL
Hgb urine dipstick: NEGATIVE
Ketones, ur: NEGATIVE mg/dL
Leukocytes,Ua: NEGATIVE
Nitrite: NEGATIVE
Protein, ur: 30 mg/dL — AB
Specific Gravity, Urine: 1.034 — ABNORMAL HIGH (ref 1.005–1.030)
pH: 5 (ref 5.0–8.0)

## 2022-05-21 LAB — HIV ANTIBODY (ROUTINE TESTING W REFLEX): HIV Screen 4th Generation wRfx: NONREACTIVE

## 2022-05-21 LAB — MAGNESIUM: Magnesium: 1.9 mg/dL (ref 1.7–2.4)

## 2022-05-21 LAB — LACTIC ACID, PLASMA: Lactic Acid, Venous: 1.5 mmol/L (ref 0.5–1.9)

## 2022-05-21 SURGERY — IRRIGATION AND DEBRIDEMENT KNEE
Anesthesia: General | Site: Knee | Laterality: Right

## 2022-05-21 MED ORDER — DEXMEDETOMIDINE (PRECEDEX) IN NS 20 MCG/5ML (4 MCG/ML) IV SYRINGE
PREFILLED_SYRINGE | INTRAVENOUS | Status: DC | PRN
  Administered 2022-05-21 (×2): 4 ug via INTRAVENOUS

## 2022-05-21 MED ORDER — THIAMINE HCL 100 MG PO TABS
100.0000 mg | ORAL_TABLET | Freq: Every day | ORAL | Status: DC
  Administered 2022-05-22 – 2022-05-26 (×5): 100 mg via ORAL
  Filled 2022-05-21 (×6): qty 1

## 2022-05-21 MED ORDER — HYDROMORPHONE HCL 1 MG/ML IJ SOLN
0.5000 mg | INTRAMUSCULAR | Status: DC | PRN
  Administered 2022-05-21: 0.5 mg via INTRAVENOUS

## 2022-05-21 MED ORDER — FENTANYL CITRATE (PF) 100 MCG/2ML IJ SOLN: 50.0000 ug | INTRAMUSCULAR | Status: DC | PRN

## 2022-05-21 MED ORDER — HYDROMORPHONE HCL 1 MG/ML IJ SOLN: 0.5000 mg | INTRAMUSCULAR | Status: DC | PRN

## 2022-05-21 MED ORDER — OXYCODONE HCL 5 MG PO TABS
5.0000 mg | ORAL_TABLET | ORAL | Status: DC | PRN
  Administered 2022-05-21 – 2022-05-22 (×2): 10 mg via ORAL
  Administered 2022-05-22: 5 mg via ORAL
  Filled 2022-05-21 (×3): qty 2

## 2022-05-21 MED ORDER — POTASSIUM CHLORIDE CRYS ER 20 MEQ PO TBCR
40.0000 meq | EXTENDED_RELEASE_TABLET | Freq: Once | ORAL | Status: AC
  Administered 2022-05-21: 40 meq via ORAL
  Filled 2022-05-21: qty 2

## 2022-05-21 MED ORDER — FENTANYL CITRATE (PF) 100 MCG/2ML IJ SOLN
INTRAMUSCULAR | Status: AC
  Filled 2022-05-21: qty 2

## 2022-05-21 MED ORDER — SPIRONOLACTONE 25 MG PO TABS
100.0000 mg | ORAL_TABLET | Freq: Every day | ORAL | Status: DC
  Administered 2022-05-22 – 2022-05-26 (×5): 100 mg via ORAL
  Filled 2022-05-21 (×5): qty 4

## 2022-05-21 MED ORDER — ACETAMINOPHEN 10 MG/ML IV SOLN
INTRAVENOUS | Status: DC | PRN
  Administered 2022-05-21: 650 mg via INTRAVENOUS

## 2022-05-21 MED ORDER — MORPHINE SULFATE (PF) 2 MG/ML IV SOLN: 2.0000 mg | INTRAVENOUS | Status: DC | PRN

## 2022-05-21 MED ORDER — FENTANYL CITRATE (PF) 100 MCG/2ML IJ SOLN
INTRAMUSCULAR | Status: DC | PRN
  Administered 2022-05-21 (×2): 50 ug via INTRAVENOUS

## 2022-05-21 MED ORDER — SODIUM CHLORIDE 0.9 % IV SOLN
2.0000 g | INTRAVENOUS | Status: DC
  Administered 2022-05-22 – 2022-05-26 (×5): 2 g via INTRAVENOUS
  Filled 2022-05-21 (×3): qty 2
  Filled 2022-05-21: qty 20
  Filled 2022-05-21: qty 2

## 2022-05-21 MED ORDER — VANCOMYCIN HCL 2000 MG/400ML IV SOLN
2000.0000 mg | Freq: Once | INTRAVENOUS | Status: AC
  Administered 2022-05-21: 2000 mg via INTRAVENOUS
  Filled 2022-05-21: qty 400

## 2022-05-21 MED ORDER — FENTANYL CITRATE (PF) 100 MCG/2ML IJ SOLN
INTRAMUSCULAR | Status: AC
  Administered 2022-05-21: 50 ug via INTRAVENOUS
  Filled 2022-05-21: qty 2

## 2022-05-21 MED ORDER — FERROUS SULFATE 325 (65 FE) MG PO TABS
325.0000 mg | ORAL_TABLET | Freq: Every day | ORAL | Status: DC
  Administered 2022-05-22 – 2022-05-26 (×5): 325 mg via ORAL
  Filled 2022-05-21 (×5): qty 1

## 2022-05-21 MED ORDER — DEXAMETHASONE SODIUM PHOSPHATE 10 MG/ML IJ SOLN
INTRAMUSCULAR | Status: DC | PRN
  Administered 2022-05-21: 10 mg via INTRAVENOUS

## 2022-05-21 MED ORDER — ACETAMINOPHEN 650 MG RE SUPP: 650.0000 mg | Freq: Four times a day (QID) | RECTAL | Status: DC | PRN

## 2022-05-21 MED ORDER — SODIUM CHLORIDE 0.9 % IR SOLN
Status: DC | PRN
  Administered 2022-05-21: 3012 mL

## 2022-05-21 MED ORDER — PROPOFOL 10 MG/ML IV BOLUS
INTRAVENOUS | Status: DC | PRN
  Administered 2022-05-21: 120 mg via INTRAVENOUS

## 2022-05-21 MED ORDER — PROPOFOL 10 MG/ML IV BOLUS
INTRAVENOUS | Status: AC
  Filled 2022-05-21: qty 20

## 2022-05-21 MED ORDER — ONDANSETRON HCL 4 MG/2ML IJ SOLN
4.0000 mg | Freq: Four times a day (QID) | INTRAMUSCULAR | Status: AC | PRN
  Administered 2022-05-23 – 2022-05-24 (×3): 4 mg via INTRAVENOUS
  Filled 2022-05-21 (×3): qty 2

## 2022-05-21 MED ORDER — SODIUM CHLORIDE 0.9 % IV SOLN
1.0000 g | Freq: Once | INTRAVENOUS | Status: DC
  Filled 2022-05-21: qty 10

## 2022-05-21 MED ORDER — OXYCODONE HCL 5 MG PO TABS
ORAL_TABLET | ORAL | Status: AC
  Filled 2022-05-21: qty 1

## 2022-05-21 MED ORDER — METHOCARBAMOL 1000 MG/10ML IJ SOLN
500.0000 mg | Freq: Once | INTRAMUSCULAR | Status: DC | PRN
Start: 2022-05-21 — End: 2022-05-21

## 2022-05-21 MED ORDER — NEOMYCIN-POLYMYXIN B GU 40-200000 IR SOLN
Status: AC
  Filled 2022-05-21: qty 40

## 2022-05-21 MED ORDER — FENTANYL CITRATE (PF) 100 MCG/2ML IJ SOLN: 25.0000 ug | INTRAMUSCULAR | Status: DC | PRN

## 2022-05-21 MED ORDER — ACETAMINOPHEN 10 MG/ML IV SOLN: 1000.0000 mg | Freq: Once | INTRAVENOUS | Status: DC | PRN

## 2022-05-21 MED ORDER — ACETAMINOPHEN 325 MG PO TABS: 650.0000 mg | ORAL_TABLET | Freq: Four times a day (QID) | ORAL | Status: DC | PRN

## 2022-05-21 MED ORDER — ONDANSETRON HCL 4 MG/2ML IJ SOLN
INTRAMUSCULAR | Status: DC | PRN
  Administered 2022-05-21: 4 mg via INTRAVENOUS

## 2022-05-21 MED ORDER — NYSTATIN 100000 UNIT/ML MT SUSP
5.0000 mL | Freq: Four times a day (QID) | OROMUCOSAL | Status: AC
Start: 2022-05-21 — End: 2022-05-23
  Administered 2022-05-21 – 2022-05-23 (×7): 500000 [IU] via ORAL
  Filled 2022-05-21 (×8): qty 5

## 2022-05-21 MED ORDER — MORPHINE SULFATE (PF) 4 MG/ML IV SOLN: 4.0000 mg | INTRAVENOUS | Status: DC | PRN

## 2022-05-21 MED ORDER — CEPHALEXIN 500 MG PO CAPS
500.0000 mg | ORAL_CAPSULE | Freq: Once | ORAL | Status: AC
  Administered 2022-05-21: 500 mg via ORAL
  Filled 2022-05-21: qty 1

## 2022-05-21 MED ORDER — ONDANSETRON HCL 4 MG PO TABS: 4.0000 mg | ORAL_TABLET | Freq: Four times a day (QID) | ORAL | Status: AC | PRN

## 2022-05-21 MED ORDER — FOLIC ACID 1 MG PO TABS
1.0000 mg | ORAL_TABLET | Freq: Every day | ORAL | Status: DC
  Administered 2022-05-22 – 2022-05-26 (×5): 1 mg via ORAL
  Filled 2022-05-21 (×6): qty 1

## 2022-05-21 MED ORDER — OXYCODONE HCL 5 MG PO TABS
5.0000 mg | ORAL_TABLET | Freq: Once | ORAL | Status: AC | PRN
  Administered 2022-05-21: 5 mg via ORAL

## 2022-05-21 MED ORDER — MIDAZOLAM HCL 2 MG/2ML IJ SOLN
INTRAMUSCULAR | Status: DC | PRN
  Administered 2022-05-21: 2 mg via INTRAVENOUS

## 2022-05-21 MED ORDER — CEFAZOLIN SODIUM-DEXTROSE 1-4 GM/50ML-% IV SOLN
INTRAVENOUS | Status: AC
  Filled 2022-05-21: qty 50

## 2022-05-21 MED ORDER — ONDANSETRON HCL 4 MG/2ML IJ SOLN
4.0000 mg | Freq: Once | INTRAMUSCULAR | Status: DC | PRN
Start: 2022-05-21 — End: 2022-05-21

## 2022-05-21 MED ORDER — MIDAZOLAM HCL 2 MG/2ML IJ SOLN
INTRAMUSCULAR | Status: AC
  Filled 2022-05-21: qty 2

## 2022-05-21 MED ORDER — HEPARIN SODIUM (PORCINE) 5000 UNIT/ML IJ SOLN
5000.0000 [IU] | Freq: Three times a day (TID) | INTRAMUSCULAR | Status: DC
Start: 2022-05-22 — End: 2022-05-26
  Administered 2022-05-22 – 2022-05-26 (×13): 5000 [IU] via SUBCUTANEOUS
  Filled 2022-05-21 (×13): qty 1

## 2022-05-21 MED ORDER — LIDOCAINE HCL (CARDIAC) PF 100 MG/5ML IV SOSY
PREFILLED_SYRINGE | INTRAVENOUS | Status: DC | PRN
  Administered 2022-05-21: 100 mg via INTRAVENOUS

## 2022-05-21 MED ORDER — POTASSIUM CHLORIDE 10 MEQ/100ML IV SOLN
10.0000 meq | INTRAVENOUS | Status: AC
  Administered 2022-05-21 (×4): 10 meq via INTRAVENOUS
  Filled 2022-05-21 (×4): qty 100

## 2022-05-21 MED ORDER — LORAZEPAM 1 MG PO TABS: 1.0000 mg | ORAL_TABLET | ORAL | Status: AC | PRN

## 2022-05-21 MED ORDER — FENTANYL CITRATE (PF) 100 MCG/2ML IJ SOLN
25.0000 ug | INTRAMUSCULAR | Status: DC | PRN
  Administered 2022-05-21: 50 ug via INTRAVENOUS

## 2022-05-21 MED ORDER — DEXTROSE 5 % IV SOLN: 1500.0000 mg | Freq: Once | INTRAVENOUS | Status: DC

## 2022-05-21 MED ORDER — OXYCODONE HCL 5 MG/5ML PO SOLN: 5.0000 mg | Freq: Once | ORAL | Status: AC | PRN

## 2022-05-21 MED ORDER — CEFAZOLIN SODIUM-DEXTROSE 2-4 GM/100ML-% IV SOLN: 2.0000 g | INTRAVENOUS | Status: DC

## 2022-05-21 MED ORDER — SODIUM CHLORIDE 0.9 % IV SOLN: INTRAVENOUS | Status: DC | PRN

## 2022-05-21 MED ORDER — ADULT MULTIVITAMIN W/MINERALS CH
1.0000 | ORAL_TABLET | Freq: Every day | ORAL | Status: DC
  Administered 2022-05-21 – 2022-05-26 (×6): 1 via ORAL
  Filled 2022-05-21 (×6): qty 1

## 2022-05-21 MED ORDER — HYDROMORPHONE HCL 1 MG/ML IJ SOLN
INTRAMUSCULAR | Status: AC
  Administered 2022-05-21: 0.5 mg via INTRAVENOUS
  Filled 2022-05-21: qty 1

## 2022-05-21 MED ORDER — METHOCARBAMOL 1000 MG/10ML IJ SOLN
500.0000 mg | Freq: Once | INTRAVENOUS | Status: DC
  Filled 2022-05-21: qty 5

## 2022-05-21 MED ORDER — LORAZEPAM 2 MG/ML IJ SOLN: 1.0000 mg | INTRAMUSCULAR | Status: AC | PRN

## 2022-05-21 MED ORDER — CEFAZOLIN SODIUM-DEXTROSE 2-4 GM/100ML-% IV SOLN
INTRAVENOUS | Status: AC
  Filled 2022-05-21: qty 100

## 2022-05-21 MED ORDER — SODIUM CHLORIDE 0.9 % IR SOLN
Status: DC | PRN
  Administered 2022-05-21: 150 mL

## 2022-05-21 MED ORDER — THIAMINE HCL 100 MG/ML IJ SOLN
100.0000 mg | Freq: Every day | INTRAMUSCULAR | Status: DC
  Administered 2022-05-21: 100 mg via INTRAVENOUS
  Filled 2022-05-21: qty 2

## 2022-05-21 SURGICAL SUPPLY — 31 items
BLADE SURG 15 STRL LF DISP TIS (BLADE) ×1 IMPLANT
BLADE SURG 15 STRL SS (BLADE) ×1
BNDG ELASTIC 6X5.8 VLCR STR LF (GAUZE/BANDAGES/DRESSINGS) ×2 IMPLANT
CAST PADDING 3X4FT ST 30246 (SOFTGOODS) ×1
DRAPE U-SHAPE 47X51 STRL (DRAPES) ×2 IMPLANT
DURAPREP 26ML APPLICATOR (WOUND CARE) ×5 IMPLANT
ELECT REM PT RETURN 9FT ADLT (ELECTROSURGICAL) ×2
ELECTRODE REM PT RTRN 9FT ADLT (ELECTROSURGICAL) ×1 IMPLANT
GAUZE SPONGE 4X4 12PLY STRL (GAUZE/BANDAGES/DRESSINGS) ×2 IMPLANT
GAUZE XEROFORM 1X8 LF (GAUZE/BANDAGES/DRESSINGS) ×2 IMPLANT
GLOVE BIOGEL PI IND STRL 9 (GLOVE) ×1 IMPLANT
GLOVE BIOGEL PI INDICATOR 9 (GLOVE) ×1
GLOVE BIOGEL PI ORTHO SZ9 (GLOVE) ×1 IMPLANT
GOWN STRL REUS TWL 2XL XL LVL4 (GOWN DISPOSABLE) ×2 IMPLANT
GOWN STRL REUS W/ TWL LRG LVL3 (GOWN DISPOSABLE) ×1 IMPLANT
GOWN STRL REUS W/TWL LRG LVL3 (GOWN DISPOSABLE) ×1
IMMBOLIZER KNEE 19 BLUE UNIV (SOFTGOODS) ×1 IMPLANT
IV NS IRRIG 3000ML ARTHROMATIC (IV SOLUTION) ×1 IMPLANT
MANIFOLD NEPTUNE II (INSTRUMENTS) ×2 IMPLANT
MAT ABSORB  FLUID 56X50 GRAY (MISCELLANEOUS) ×1
MAT ABSORB FLUID 56X50 GRAY (MISCELLANEOUS) ×1 IMPLANT
NS IRRIG 1000ML POUR BTL (IV SOLUTION) ×2 IMPLANT
PACK EXTREMITY ARMC (MISCELLANEOUS) ×2 IMPLANT
PAD CAST CTTN 3X4 STRL (SOFTGOODS) ×2 IMPLANT
PULSAVAC PLUS IRRIG FAN TIP (DISPOSABLE) ×2
SPONGE T-LAP 18X18 ~~LOC~~+RFID (SPONGE) ×1 IMPLANT
STOCKINETTE 48X4 2 PLY STRL (GAUZE/BANDAGES/DRESSINGS) ×1 IMPLANT
STOCKINETTE STRL 4IN 9604848 (GAUZE/BANDAGES/DRESSINGS) ×2 IMPLANT
STRIP CLOSURE SKIN 1/2X4 (GAUZE/BANDAGES/DRESSINGS) ×1 IMPLANT
TIP FAN IRRIG PULSAVAC PLUS (DISPOSABLE) IMPLANT
WATER STERILE IRR 500ML POUR (IV SOLUTION) ×2 IMPLANT

## 2022-05-21 NOTE — ED Provider Notes (Signed)
I assumed care of this patient at approximately 0 700.  Please see outgoing providers note for full details regarding patient's initial evaluation assessment.  In brief patient presents with a history of cirrhosis and polysubstance abuse including alcohol amphetamines as well as recent hospitalization with concerns for right leg cellulitis and a septic bursitis requiring OR I&D.  He is returning for some ongoing pain in his right lower extremity.  He did not go to follow-up visit and has sutures in place.  He is pending an MRI and Ortho consult at time of signout.  MRI my interpretation does show what appears to be a prepatellar fluid collection.  I reviewed radiology interpretation and agree their findings of soft tissue edema consistent with cellulitis as well as a fluid collection that is increased from prior in the patellar area as well as unchanged small joint knee effusion and patchy marrow edema.  I did reach out to Dr. Martha Clan evaluated patient.  Patient will be taken to the OR for washout.  He recommended hospitalist admission and I will start patient IV antibiotics and admit to hospital service.  Blood cultures obtained prior to signout.  Patient does not seem septic on arrival with no fever and stable vitals.  His CBC shows WC count 3.9 hemoglobin 10.7 prior to 10.58 days ago with platelets of 84.  CMP is remarkable for K of 2.9 without other significant acute metabolic derangements.  .Suture Removal  Date/Time: 05/21/2022 1:06 PM  Performed by: Gilles Chiquito, MD Authorized by: Gilles Chiquito, MD   Consent:    Consent obtained:  Verbal   Consent given by:  Patient   Risks discussed:  Pain and bleeding   Alternatives discussed:  No treatment Universal protocol:    Procedure explained and questions answered to patient or proxy's satisfaction: yes   Procedure details:    Wound appearance:  Tender, nonpurulent, draining, pink and moist   Number of sutures removed:  6 Post-procedure  details:    Procedure completion:  Tolerated well, no immediate complications     Gilles Chiquito, MD 05/21/22 1310

## 2022-05-21 NOTE — Op Note (Signed)
05/21/2022  3:31 PM  PATIENT:  Marco Spears    PRE-OPERATIVE DIAGNOSIS: Recurrent septic prepatellar bursitis, right knee  POST-OPERATIVE DIAGNOSIS:  Same  PROCEDURE: Irrigation debridement of right knee prepatellar bursa  SURGEON:  Thornton Park, MD  ANESTHESIA:   General  PREOPERATIVE INDICATIONS:  [name] is a  46 y.o. male with a diagnosis of right knee recurrent septic prepatellar bursitis.  Patient underwent I&D of the right prepatellar bursa on 05/07/2022.  He is homeless and has been noncompliant with his antibiotics.  I discussed the risks and benefits of surgery. The risks include but are not limited to recurrent or persistent infection, bleeding , nerve or blood vessel injury, joint stiffness or loss of motion, persistent pain, weakness or instability, development of a septic right knee joint, osteomyelitis and the need for further surgery. Medical risks include but are not limited to DVT and pulmonary embolism, myocardial infarction, stroke, pneumonia, respiratory failure and death. Patient understood these risks and wished to proceed.   OPERATIVE FINDINGS: Incompletely healed right anterior knee incision with serous appearing drainage.  OPERATIVE PROCEDURE: Patient was met in the preoperative area.  His right knee was marked according to hospital's correct site of surgery protocol.  Patient had swelling erythema and drainage from his incision.  Given the risk of worsening infection, patient was deemed emergent and brought to the OR for I&D for methamphetamine.    Patient was brought to the OR where he underwent general endotracheal intubation.  He was positioned supine on the operative table.  All bony prominences were adequately padded.  Patient had a tourniquet applied to his right upper thigh.  He was prepped and draped in a sterile fashion.  A timeout was performed to verify the patient's name, date of birth, medical record number, correct site of surgery correct  procedure to be performed.  Patient had received vancomycin and Rocephin prior to the case.  The patient's incision could be separated by hand.  Blood-tinged serous fluid was encountered in the prepatellar bursa and swabbed for Gram stain and culture.  Blunt digital debridement of prepatellar adhesions was performed.  Patient had all bleeding vessels cauterized.  The tourniquet was not inflated during this case.  The pulse lavage was used to irrigate the right knee with 3 L of GU and fused saline.  The wound was then inspected and was deemed to be dry.  No purulent fluid was encountered during the case.  The patient had the edges of his incision freshened with a fresh #15 blade.  Patient's wound was closed with interrupted 3-0 nylon.  A dry sterile compressive dressing was applied to the anterior knee.  The patient's knee was placed in a knee immobilizer to prevent flexion which may place tension on his incision and possibly lead to dehiscence.  I wass scrubbed and present for the entire case.  All sharp, sponge and instrument counts were correct at the conclusion the case.  The Patient will be admitted to the hospital service for IV antibiotics.

## 2022-05-21 NOTE — Progress Notes (Signed)
PHARMACY -  BRIEF ANTIBIOTIC NOTE   Pharmacy has received consult(s) for vancomycin from an ED provider.  The patient's profile has been reviewed for ht/wt/allergies/indication/available labs.    One time order placed for vancomycin 2,000 mg x 1  Further antibiotics/pharmacy consults should be ordered by admitting physician if indicated.                       Thank you, Jaynie Bream 05/21/2022  1:26 PM

## 2022-05-21 NOTE — Anesthesia Procedure Notes (Signed)
Procedure Name: LMA Insertion Date/Time: 05/21/2022 1:50 PM  Performed by: Cheral Bay, CRNAPre-anesthesia Checklist: Patient identified, Emergency Drugs available, Suction available and Patient being monitored Patient Re-evaluated:Patient Re-evaluated prior to induction Oxygen Delivery Method: Circle system utilized Preoxygenation: Pre-oxygenation with 100% oxygen Induction Type: IV induction Ventilation: Mask ventilation without difficulty LMA: LMA inserted LMA Size: 4.5 Tube type: Oral Number of attempts: 1 Placement Confirmation: positive ETCO2 and breath sounds checked- equal and bilateral Tube secured with: Tape Dental Injury: Teeth and Oropharynx as per pre-operative assessment

## 2022-05-21 NOTE — Plan of Care (Signed)

## 2022-05-21 NOTE — Assessment & Plan Note (Signed)
-   CIWA precaution

## 2022-05-21 NOTE — ED Provider Notes (Signed)
[facility] Provider Note    Event Date/Time   First MD Initiated Contact with Patient 05/21/22 0130     (approximate)   History   No chief complaint on file.   HPI  Marco Spears is a 46 y.o. male with past medical history that includes cirrhosis of the liver and polysubstance abuse including alcohol and amphetamines and cannabinoids (denies IV drug use) and recent hospitalization about 2 weeks ago with complications of cirrhosis and infection of the right leg with cellulitis and septic bursitis requiring orthopedic surgery to perform drainage in the OR.  His situation is further complicated by homelessness and medication noncompliance; he has not been taking the antibiotics that were prescribed for his infection.  He presents tonight for worsening swelling in the abdomen, the right leg, redness around the knee, and drainage.  He did not follow-up with anyone to have the sutures removed though they are still in place from the procedure.  He said that fluid leaks from the wound.  He also said that he is having increased swelling of his abdomen and he thinks more fluid needs to be taken off (they drained about 6 L with a paracentesis during his prior hospitalization).  No recent fevers but he has had chills.  No chest pain or shortness of breath.     Physical Exam   Triage Vital Signs: ED Triage Vitals  Enc Vitals Group     BP 05/20/22 2045 131/76     Pulse Rate 05/20/22 2045 (!) 105     Resp 05/20/22 2045 20     Temp 05/20/22 2045 98.7 F (37.1 C)     Temp Source 05/20/22 2045 Oral     SpO2 05/20/22 2045 99 %     Weight 05/20/22 2022 117.8 kg (259 lb 9.6 oz)     Height --      Head Circumference --      Peak Flow --      Pain Score --      Pain Loc --      Pain Edu? --      Excl. in GC? --     Most recent vital signs: Vitals:   05/21/22 0324 05/21/22 0600  BP: 104/69 113/71  Pulse: 83 84  Resp: 19 19  Temp: 97.8 F (36.6 C) 98.1 F (36.7  C)  SpO2: 100% 97%     General: Awake, appears uncomfortable, somewhat disheveled, nontoxic. CV:  Good peripheral perfusion.  Normal heart sounds. Resp:  Normal effort.  Lungs are clear to auscultation. Abd:  Some distention with mild ascites.  Generalized tenderness to palpation throughout without peritonitis. MSK:  Trace pitting edema in bilateral lower extremities.  Right leg is greater than left leg and there is some mild erythema centered on his knee but extending downwards into the calf, better than prior based on the medical record.  He has a surgical wound on the knee with sutures still in place.  The wound is tense and there is some serous fluid leaking from it.  Suture removal deferred to Dr. Katrinka Blazing (see his separate documentation)   ED Results / Procedures / Treatments   Labs (all labs ordered are listed, but only abnormal results are displayed) Labs Reviewed  COMPREHENSIVE METABOLIC PANEL - Abnormal; Notable for the following components:      Result Value   Potassium 2.9 (*)    Glucose, Bld 143 (*)    Calcium 7.6 (*)    Total  Protein 6.2 (*)    Albumin 2.5 (*)    AST 65 (*)    Alkaline Phosphatase 134 (*)    Total Bilirubin 3.2 (*)    Anion gap 4 (*)    All other components within normal limits  URINALYSIS, ROUTINE W REFLEX MICROSCOPIC - Abnormal; Notable for the following components:   Color, Urine AMBER (*)    APPearance CLEAR (*)    Specific Gravity, Urine 1.034 (*)    Protein, ur 30 (*)    All other components within normal limits  CBC WITH DIFFERENTIAL/PLATELET - Abnormal; Notable for the following components:   WBC 3.9 (*)    RBC 3.09 (*)    Hemoglobin 10.7 (*)    HCT 31.9 (*)    MCV 103.2 (*)    MCH 34.6 (*)    RDW 17.0 (*)    Platelets 84 (*)    Lymphs Abs 0.6 (*)    All other components within normal limits  PROTIME-INR - Abnormal; Notable for the following components:   Prothrombin Time 19.7 (*)    INR 1.7 (*)    All other components within  normal limits  URINE DRUG SCREEN, QUALITATIVE ([facility] ONLY) - Abnormal; Notable for the following components:   Amphetamines, Ur Screen POSITIVE (*)    Cannabinoid 50 Ng, Ur [place] POSITIVE (*)    All other components within normal limits  CULTURE, BLOOD (ROUTINE X 2)  CULTURE, BLOOD (ROUTINE X 2) W REFLEX TO ID PANEL  LIPASE, BLOOD  APTT  CK  LACTIC ACID, PLASMA  LACTIC ACID, PLASMA  MAGNESIUM  TROPONIN I (HIGH SENSITIVITY)  TROPONIN I (HIGH SENSITIVITY)     EKG  ED ECG REPORT I, Loleta Rose, the attending physician, personally viewed and interpreted this ECG.  Date: 05/20/2022 EKG Time: 21: 01 Rate: 98 Rhythm: normal sinus rhythm QRS Axis: normal Intervals: Prolonged QTc of 530 ms. ST/T Wave abnormalities: Non-specific ST segment / T-wave changes, but no clear evidence of acute ischemia. Narrative Interpretation: no definitive evidence of acute ischemia; does not meet STEMI criteria.    RADIOLOGY MRI knee without contrast pending at time of transfer of care to Dr. Katrinka Blazing    PROCEDURES:  Critical Care performed: No  Procedures   MEDICATIONS ORDERED IN ED: Medications  cephALEXin (KEFLEX) capsule 500 mg (has no administration in time range)  potassium chloride SA (KLOR-CON M) CR tablet 40 mEq (40 mEq Oral Given 05/21/22 0755)     IMPRESSION / MDM / ASSESSMENT AND PLAN / ED COURSE  I reviewed the triage vital signs and the nursing notes.                              Differential diagnosis includes, but is not limited to, persistent wound infection, abscess development, recurrent septic bursitis, septic arthritis, cellulitis, bacteremia, worsening ascites.  Patient's presentation is most consistent with acute presentation with potential threat to life or bodily function.  I ordered standard sepsis labs including the following: blood cultures x2, pro time-INR, CMP, urinalysis, urine drug screen, lactic acid, APTT, CBC with differential, high-sensitivity troponin,  lipase.  Lab work notable for mild leukopenia of 3.9 and thrombocytopenia of 84,000 platelets.  Urine drug screen positive for amphetamines and cannabinoids similar to prior.  No significant findings on urinalysis.  Comprehensive metabolic panel notable for potassium of 2.9 and elevated LFTs including a T. bili of 3.2, however I do not have a baseline against which  I can compare even from his prior hospitalization.  Lactic acid was reassuring at 1.5.  The patient is on the cardiac monitor to evaluate for evidence of arrhythmia and/or significant heart rate changes.  I reviewed the discharge summary from Dr. Janee Morn on 05/13/2022, as well as the surgical note by Dr. Martha Clan from the drainage of the septic bursitis.  The patient had group A strep and was supposed to be taking Keflex as an outpatient for at least another 1 week, but it sounds as if he has been noncompliant with this.   Given the concern for redevelopment of infection or worsening infection, I will repeat the MR knee without contrast which should provide adequate visualization of the joint itself as well as the soft tissues.  I think it is unlikely he has septic arthritis given that he is able to ambulate, but he may have redeveloped fluid or developed a deep abscess based on his prior surgery and noncompliance.   Patient is resting comfortably at this time.  He will need reassessment to determine appropriate antibiosis and/or surgical intervention.  No indication that he is having an emergent complication of his liver cirrhosis.  He may be a candidate for dalbavancin for the leg infection.  07:15: Transferring ED care to Dr. Antoine Primas to follow-up on MRI and reassess as well as to remove the sutures from the patient's right knee.      Rx / DC Orders   ED Discharge Orders          Ordered    Ambulatory referral to Infectious Disease       Comments: Cellulitis patient:  Received dalbavancin on 05/21/2022.   05/21/22 0756              Note:  This document was prepared using Dragon voice recognition software and may include unintentional dictation errors.   Loleta Rose, MD 05/21/22 [phone]

## 2022-05-21 NOTE — Consult Note (Signed)
ORTHOPAEDIC CONSULTATION  REQUESTING PHYSICIAN: No att. providers found  Chief Complaint: Right knee swelling and erythema  HPI: Marco Spears is a 46 y.o. homeless male who complains of right knee pain and swelling.  Patient is status post incision and drainage of right knee septic bursitis on 05/07/2022.  Patient was in the hospital received IV antibiotics.  Patient was seen by infectious disease and was prescribed antibiotics which were provided to the patient free of charge.  Patient today admits that he is not been compliant with taking his antibiotics.  He states that his right knee surgical incision continues to drain.  Past Medical History:  Diagnosis Date   Cirrhosis of liver (HCC)    Heart murmur    Past Surgical History:  Procedure Laterality Date   INCISION AND DRAINAGE Right 05/07/2022   Procedure: INCISION AND DRAINAGE;  Surgeon: Juanell Fairly, MD;  Location: [facility] ORS;  Service: Orthopedics;  Laterality: Right;   Social History   Socioeconomic History   Marital status: Single    Spouse name: Not on file   Number of children: Not on file   Years of education: Not on file   Highest education level: Not on file  Occupational History   Not on file  Tobacco Use   Smoking status: Some Days    Types: Cigarettes   Smokeless tobacco: Never  Substance and Sexual Activity   Alcohol use: Never   Drug use: Yes    Types: Marijuana   Sexual activity: Not on file  Other Topics Concern   Not on file  Social History Narrative   Not on file   Social Determinants of Health   Financial Resource Strain: Not on file  Food Insecurity: Not on file  Transportation Needs: Not on file  Physical Activity: Not on file  Stress: Not on file  Social Connections: Not on file   History reviewed. No pertinent family history. Allergies  Allergen Reactions   Amoxicillin Rash    TOLERATED CEFAZOLIN PRIOR States it makes his skin turn red   Prior to Admission medications    Medication Sig Start Date End Date Taking? Authorizing Provider  apixaban (ELIQUIS) 2.5 MG TABS tablet Take 1 tablet (2.5 mg total) by mouth 2 (two) times daily for 10 days. 05/12/22 05/23/22 Yes Enedina Finner, MD  B Complex-C (B-COMPLEX WITH VITAMIN C) tablet Take 1 tablet by mouth daily.    [provider]  cephALEXin (KEFLEX) 500 MG capsule Take 1 capsule (500 mg total) by mouth 4 (four) times daily for 16 days. 05/13/22 05/29/22  Enedina Finner, MD  ferrous sulfate 325 (65 FE) MG tablet Take 1 tablet (325 mg total) by mouth daily with breakfast. 05/12/22   Enedina Finner, MD  folic acid (FOLVITE) 1 MG tablet Take 1 tablet (1 mg total) by mouth daily. 05/12/22   Enedina Finner, MD  furosemide (LASIX) 40 MG tablet Take 1 tablet (40 mg total) by mouth 2 (two) times daily. 05/13/22   Enedina Finner, MD  Multiple Vitamin (MULTIVITAMIN WITH MINERALS) TABS tablet Take 1 tablet by mouth daily.    [provider]  spironolactone (ALDACTONE) 50 MG tablet Take 2 tablets (100 mg total) by mouth daily. 05/12/22   Enedina Finner, MD   MR KNEE RIGHT WO CONTRAST  Result Date: 05/21/2022 CLINICAL DATA:  Septic arthritis suspected, knee, no prior imaging increased pain, redness, swelling, discharge from right knee wound about 2 weeks after surgery for septic bursitis; non-compliant with antibiotics EXAM: MRI OF  THE RIGHT KNEE WITHOUT CONTRAST TECHNIQUE: Multiplanar, multisequence MR imaging of the knee was performed. No intravenous contrast was administered. COMPARISON:  MRI  05/06/2022 FINDINGS: Technical Note: Despite efforts by the technologist and patient, motion artifact is present on today's exam and could not be eliminated. This reduces exam sensitivity and specificity. Bones/Joint/Cartilage No acute fracture. No dislocation. Mild tricompartmental chondral irregularities, without interval progression from prior. Small knee joint effusion, similar in size to prior. Mild patchy marrow edema within the posterior aspect  of the medial tibial plateau is unchanged from prior and likely reactive to overlying medial meniscal tear. There is also mild patchy marrow edema within the lateral aspect of the patella. Preservation of the fatty T1 marrow signal. No focal bone erosion. Ligaments and Menisci Oblique undersurface tear of the medial meniscal posterior horn and body. Intact lateral meniscus. Intact cruciate and collateral ligaments. Muscles and Tendons Musculotendinous structures within normal limits. Soft tissues Diffuse circumferential soft tissue edema and ill-defined fluid throughout the knee with overlying skin thickening. Focal fluid collection within the prepatellar soft tissues has increased in size now measuring approximately 4.0 x 1.0 x 3.7 cm. Collection has communication to the skin surface anteriorly, likely related to interval incision and drainage. IMPRESSION: 1. Similar degree of diffuse circumferential soft tissue edema and ill-defined fluid throughout the knee compatible cellulitis. Focal fluid collection within the prepatellar soft tissues has increased in size now measuring approximately 4.0 x 1.0 x 3.7 cm. Collection has communication to the skin surface anteriorly, likely related to interval incision and drainage. 2. Unchanged small knee joint effusion is favored reactive secondary to internal derangement. Septic arthritis is felt to be unlikely. 3. Mild patchy marrow edema within the lateral aspect of the patella with preservation of the T1 marrow signal. This could be reactive or reflect a mild bone contusion. No evidence to suggest acute osteomyelitis at this time. 4. 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 Electronically Signed   By: Davina Poke D.O.   On: 05/21/2022 08:18    Positive ROS: All other systems have been reviewed and were otherwise negative with the exception of those mentioned in the HPI and as above.  Physical Exam: General: Alert, no acute  distress  MUSCULOSKELETAL: Right knee: Patient has an incompletely healed anterior right knee incision.  There is cloudy fluid draining from this incision with swelling in the prepatellar bursa and associated erythema.  Patient distally is neurovascular intact.  He has right lower extremity edema diffusely.  Assessment: Right knee recurrent septic bursitis  Plan: Patient had an MRI showing soft tissue edema throughout the knee compatible cellulitis.  There is a focal collection in the prepatellar soft tissues which is increased in size compared to his previous hospitalization.  There is no plans for a septic right knee joint.  I recommended repeat I&D of the right knee.  Given the patient's worsening clinical condition this case is deemed emergent to prevent spreading infection including extension into the right knee joint.  Patient has been NPO.  The right knee was marked according to hospital's correct site of surgery protocol.  I discussed the risks and benefits of surgery. The risks include but are not limited to recurrent or persistent infection, bleeding , nerve or blood vessel injury, joint stiffness or loss of motion, persistent pain, weakness or instability,  and the need for further surgery. Medical risks include but are not limited to DVT and pulmonary embolism, myocardial infarction, stroke, pneumonia, respiratory failure and death. Patient understood these  risks and wished to proceed.     Juanell Fairly, MD    05/21/2022 1:25 PM

## 2022-05-21 NOTE — Hospital Course (Signed)
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  Initial vitals in the emergency department showed temperature of 98.1, respiration rate of 19, heart rate of 84, blood pressure 113/71, SPO2 of 97% on room air.  Serum sodium is 138, potassium 2.9, chloride 108, bicarb 26, BUN of 8, serum creatinine 0.74, nonfasting blood glucose 143, EGFR greater than 60.  WBC was 3.9, hemoglobin 10.7, platelets 84.  ED treatment: Potassium chloride 40 mill) one-time dose, cephalexin 500 mg p.o., vancomycin.

## 2022-05-21 NOTE — Plan of Care (Signed)

## 2022-05-21 NOTE — Assessment & Plan Note (Signed)
Corrected calcium is 8.8.

## 2022-05-21 NOTE — ED Notes (Signed)
Patient transported to MRI 

## 2022-05-21 NOTE — Anesthesia Postprocedure Evaluation (Signed)
Anesthesia Post Note  Patient: Marco Spears  Procedure(s) Performed: IRRIGATION AND DEBRIDEMENT KNEE (Right: Knee)  Patient location during evaluation: PACU Anesthesia Type: General Level of consciousness: awake and alert, oriented and patient cooperative Pain management: pain level controlled Vital Signs Assessment: post-procedure vital signs reviewed and stable Respiratory status: spontaneous breathing, nonlabored ventilation and respiratory function stable Cardiovascular status: blood pressure returned to baseline and stable Postop Assessment: adequate PO intake Anesthetic complications: no   No notable events documented.   Last Vitals:  Vitals:   05/21/22 1530 05/21/22 1545  BP: 131/83 131/82  Pulse: 74 78  Resp: 19 19  Temp:  36.4 C  SpO2: 97% 98%    Last Pain:  Vitals:   05/21/22 1558  TempSrc:   PainSc: Asleep                 [name]

## 2022-05-21 NOTE — H&P (Addendum)
History and Physical   Marco Spears GBT:[id number] DOB: 1976-10-17 DOA: 05/21/2022  PCP: Pcp, No  Patient coming from: Home  I have personally briefly reviewed patient's old medical records in [place].  Chief Concern: Right knee wound  HPI: 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  Initial vitals in the emergency department showed temperature of 98.1, respiration rate of 19, heart rate of 84, blood pressure 113/71, SPO2 of 97% on room air.  Serum sodium is 138, potassium 2.9, chloride 108, bicarb 26, BUN of 8, serum creatinine 0.74, nonfasting blood glucose 143, EGFR greater than 60.  WBC was 3.9, hemoglobin 10.7, platelets 84.  ED treatment: Potassium chloride 40 mill) one-time dose, cephalexin 500 mg p.o., vancomycin.  At bedside patient was able to tell me his name, his age.  He knows he is in the hospital.  He does not appear to be in acute distress though he is mildly confused and sleepy post surgery and recovering in PACU.  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  He reports he has taken [place] treatment for in 2017/2018.  He endorses completion of course.  He further endorses that he is not taking his Eliquis.  He reports the Eliquis was prescribed for a DVT of his leg.  Social history: Patient is homeless  ROS: Unable to complete as patient is still recovering in PACU  ED Course: Discussed with emergency medicine provider, patient requiring hospitalization for chief concern for septic arthritis/bursitis.  Assessment/Plan  Principal Problem:   Septic arthritis (HCC) Active Problems:   Cellulitis   Anemia   Hypokalemia   Cirrhosis of liver without ascites (HCC)   Polysubstance  abuse (HCC)   Alcohol abuse   On anticoagulant therapy   Hypocalcemia   Tongue lesion   Assessment and Plan:  * Septic arthritis ([place]) - Presumed secondary to antibiotic noncompliance - Patient was discharged on 05/05/2022 with free Keflex 500 mg every 6 hours until 05/28/2022 however patient did not take his antibiotics as directed - Status post ceftriaxone 1 g IV per EDP and vancomycin 2 g IV one-time dose - We will check MRSA PCR, if negative no indications for vancomycin - Aerobic/anaerobic culture with Gram stain have been ordered for the right leg pending OR collection - We will continue with ceftriaxone  Hypokalemia - Magnesium is 1.9 on admission - Status post potassium chloride 40 mill equivalent p.o. one-time dose - Potassium chloride 10 mill equivalent IV administer over 60 minutes q. 1 hour x 4 - BMP in the a.m.  Tongue lesion  - On right lateral, query oral candidiasis versus carcinoma - Lesion does not appear consistent with aphthous ulcer - Nystatin swish and swallow - Check HIV - Recommended patient follow-up with PCP if lesion does not resolve and or reoccurs patient may need referral to oncology and/or ent for outpatient biopsy - TOC place for PCP - Patient endorses understanding and compliance  Hypocalcemia - Corrected calcium is 8.8  On anticoagulant therapy - Patient states he was prescribed Eliquis for lower extremity clots however he has not taken it for many weeks - Ultrasound of the bilateral lower extremity in June 2023 and the right lower extremity was read as negative for DVT -  I did not resume Eliquis on admission  Alcohol abuse - CIWA precaution  Chart reviewed.   DVT prophylaxis: Heparin 5000 units subcutaneous every 8 hours starting on 05/22/2022 Code Status: Full code Diet: npo Family Communication: No Disposition Plan: Pending clinical course Consults called: Orthopedic surgery Admission status: Telemetry medical, observation  Past  Medical History:  Diagnosis Date   Cirrhosis of liver ([place])    Heart murmur    Past Surgical History:  Procedure Laterality Date   INCISION AND DRAINAGE Right 05/07/2022   Procedure: INCISION AND DRAINAGE;  Surgeon: Thornton Park, MD;  Location: [facility] ORS;  Service: Orthopedics;  Laterality: Right;   Social History:  reports that he has been smoking cigarettes. He has never used smokeless tobacco. He reports current drug use. Drug: Marijuana. He reports that he does not drink alcohol.  Allergies  Allergen Reactions   Amoxicillin Rash    TOLERATED CEFAZOLIN PRIOR States it makes his skin turn red   History reviewed. No pertinent family history. Family history: Family history reviewed and not pertinent  Prior to Admission medications   Medication Sig Start Date End Date Taking? Authorizing Provider  apixaban (ELIQUIS) 2.5 MG TABS tablet Take 1 tablet (2.5 mg total) by mouth 2 (two) times daily for 10 days. 05/12/22 05/23/22 Yes Fritzi Mandes, MD  B Complex-C (B-COMPLEX WITH VITAMIN C) tablet Take 1 tablet by mouth daily.    [provider]  cephALEXin (KEFLEX) 500 MG capsule Take 1 capsule (500 mg total) by mouth 4 (four) times daily for 16 days. 05/13/22 05/29/22  Fritzi Mandes, MD  ferrous sulfate 325 (65 FE) MG tablet Take 1 tablet (325 mg total) by mouth daily with breakfast. 05/12/22   Fritzi Mandes, MD  folic acid (FOLVITE) 1 MG tablet Take 1 tablet (1 mg total) by mouth daily. 05/12/22   Fritzi Mandes, MD  furosemide (LASIX) 40 MG tablet Take 1 tablet (40 mg total) by mouth 2 (two) times daily. 05/13/22   Fritzi Mandes, MD  Multiple Vitamin (MULTIVITAMIN WITH MINERALS) TABS tablet Take 1 tablet by mouth daily.    [provider]  spironolactone (ALDACTONE) 50 MG tablet Take 2 tablets (100 mg total) by mouth daily. 05/12/22   Fritzi Mandes, MD   Physical Exam: Vitals:   05/21/22 1600 05/21/22 1615 05/21/22 1716 05/21/22 1932  BP: 122/84 123/87 111/72 103/62  Pulse: 68 60 76 72   Resp: 15 14 16 17   Temp:  97.6 F (36.4 C) (!) 97.5 F (36.4 C) 97.9 F (36.6 C)  TempSrc:      SpO2: 95% 94% 100% 100%  Weight:      Height:       Constitutional: appears age appropriate, NAD, calm, comfortable Eyes: PERRL, lids and conjunctivae normal ENMT: Mucous membranes are moist. Posterior pharynx clear of any exudate or lesions. Age-appropriate dentition. Hearing appropriate Neck: normal, supple, no masses, no thyromegaly Respiratory: clear to auscultation bilaterally, no wheezing, no crackles. Normal respiratory effort. No accessory muscle use.  Cardiovascular: Regular rate and rhythm, no murmurs / rubs / gallops. No extremity edema. 2+ pedal pulses. No carotid bruits.  Abdomen: no tenderness, no masses palpated, no hepatosplenomegaly. Bowel sounds positive.  Musculoskeletal: no clubbing / cyanosis. No joint deformity upper and lower extremities. Good ROM, no contractures, no atrophy. Normal muscle tone.  Skin: no rashes, lesions, ulcers. No induration.  Right knee wound with dressing and brace in place Neurologic: Sensation intact. Strength 5/5 in all 4.  Psychiatric: Normal judgment and insight.  Alert and oriented x 3. Normal mood.   EKG: independently reviewed, showing sinus rhythm with rate of 98, QTc 538  Chest x-ray on Admission: Not indicated at this time  MR KNEE RIGHT WO CONTRAST  Result Date: 05/21/2022 CLINICAL DATA:  Septic arthritis suspected, knee, no prior imaging increased pain, redness, swelling, discharge from right knee wound about 2 weeks after surgery for septic bursitis; non-compliant with antibiotics EXAM: MRI OF THE RIGHT KNEE WITHOUT CONTRAST TECHNIQUE: Multiplanar, multisequence MR imaging of the knee was performed. No intravenous contrast was administered. COMPARISON:  MRI  05/06/2022 FINDINGS: Technical Note: Despite efforts by the technologist and patient, motion artifact is present on today's exam and could not be eliminated. This reduces exam  sensitivity and specificity. Bones/Joint/Cartilage No acute fracture. No dislocation. Mild tricompartmental chondral irregularities, without interval progression from prior. Small knee joint effusion, similar in size to prior. Mild patchy marrow edema within the posterior aspect of the medial tibial plateau is unchanged from prior and likely reactive to overlying medial meniscal tear. There is also mild patchy marrow edema within the lateral aspect of the patella. Preservation of the fatty T1 marrow signal. No focal bone erosion. Ligaments and Menisci Oblique undersurface tear of the medial meniscal posterior horn and body. Intact lateral meniscus. Intact cruciate and collateral ligaments. Muscles and Tendons Musculotendinous structures within normal limits. Soft tissues Diffuse circumferential soft tissue edema and ill-defined fluid throughout the knee with overlying skin thickening. Focal fluid collection within the prepatellar soft tissues has increased in size now measuring approximately 4.0 x 1.0 x 3.7 cm. Collection has communication to the skin surface anteriorly, likely related to interval incision and drainage. IMPRESSION: 1. Similar degree of diffuse circumferential soft tissue edema and ill-defined fluid throughout the knee compatible cellulitis. Focal fluid collection within the prepatellar soft tissues has increased in size now measuring approximately 4.0 x 1.0 x 3.7 cm. Collection has communication to the skin surface anteriorly, likely related to interval incision and drainage. 2. Unchanged small knee joint effusion is favored reactive secondary to internal derangement. Septic arthritis is felt to be unlikely. 3. Mild patchy marrow edema within the lateral aspect of the patella with preservation of the T1 marrow signal. This could be reactive or reflect a mild bone contusion. No evidence to suggest acute osteomyelitis at this time. 4. 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  Electronically Signed   By: Davina Poke D.O.   On: 05/21/2022 08:18    Labs on Admission: I have personally reviewed following labs  CBC: Recent Labs  Lab 05/20/22 2043  WBC 3.9*  NEUTROABS 2.8  HGB 10.7*  HCT 31.9*  MCV 103.2*  PLT 84*   Basic Metabolic Panel: Recent Labs  Lab 05/20/22 2043 05/20/22 2355  NA 138  --   K 2.9*  --   CL 108  --   CO2 26  --   GLUCOSE 143*  --   BUN 8  --   CREATININE 0.74  --   CALCIUM 7.6*  --   MG  --  1.9   GFR: Estimated Creatinine Clearance: 147.6 mL/min (by C-G formula based on SCr of 0.74 mg/dL).  Liver Function Tests: Recent Labs  Lab 05/20/22 2043  AST 65*  ALT 44  ALKPHOS 134*  BILITOT 3.2*  PROT 6.2*  ALBUMIN 2.5*   Recent Labs  Lab 05/20/22 2043  LIPASE 40   Coagulation Profile: Recent Labs  Lab 05/20/22 2043  INR 1.7*   Cardiac Enzymes: Recent Labs  Lab 05/20/22 2043  CKTOTAL 55   Urine analysis:    Component Value Date/Time   COLORURINE AMBER (A) 05/20/2022 2043   APPEARANCEUR CLEAR (A) 05/20/2022 2043   LABSPEC 1.034 (H) 05/20/2022 2043   PHURINE 5.0 05/20/2022 2043   GLUCOSEU NEGATIVE 05/20/2022 2043   HGBUR NEGATIVE 05/20/2022 2043   [place] NEGATIVE 05/20/2022 2043   [place] NEGATIVE 05/20/2022 2043   PROTEINUR 30 (A) 05/20/2022 2043   NITRITE NEGATIVE 05/20/2022 2043   LEUKOCYTESUR NEGATIVE 05/20/2022 2043   Dr. Tobie Poet [facility]  If 7PM-7AM, please contact overnight-coverage provider If 7AM-7PM, please contact day coverage provider [email or web address]  05/21/2022, 7:55 PM

## 2022-05-21 NOTE — Assessment & Plan Note (Addendum)
-   Magnesium is 1.9 on admission - Status post potassium chloride 40 mill equivalent p.o. one-time dose - Potassium chloride 10 mill equivalent IV administer over 60 minutes q. 1 hour x 4 - BMP in the a.m.

## 2022-05-21 NOTE — Assessment & Plan Note (Signed)
-   Presumed secondary to antibiotic noncompliance - Patient was discharged on 05/05/2022 with free Keflex 500 mg every 6 hours until 05/28/2022 however patient did not take his antibiotics as directed - Status post ceftriaxone 1 g IV per EDP and vancomycin 2 g IV one-time dose - We will check MRSA PCR, if negative no indications for vancomycin - Aerobic/anaerobic culture with Gram stain have been ordered for the right leg pending OR collection - We will continue with ceftriaxone

## 2022-05-21 NOTE — Anesthesia Preprocedure Evaluation (Signed)
Anesthesia Evaluation  Patient identified by MRN, date of birth, ID band Patient awake  General Assessment Comment:  Meth + Surgeon declaring case emergency. NPO appropriate, no vomiting  Reviewed: Allergy & Precautions, H&P , NPO status , Patient's Chart, lab work & pertinent test results, reviewed documented beta blocker date and time   History of Anesthesia Complications Negative for: history of anesthetic complications  Airway Mallampati: II  TM Distance: >3 FB Neck ROM: full    Dental  (+) Dental Advidsory Given, Poor Dentition, Missing, Chipped   Pulmonary neg shortness of breath, neg COPD, neg recent URI, Current SmokerPatient did not abstain from smoking.,    Pulmonary exam normal breath sounds clear to auscultation       Cardiovascular Exercise Tolerance: Good (-) hypertension(-) angina(-) CAD, (-) Past MI and (-) Cardiac Stents Normal cardiovascular exam(-) dysrhythmias + Valvular Problems/Murmurs  Rhythm:regular Rate:Normal - Systolic murmurs    Neuro/Psych negative neurological ROS  negative psych ROS   GI/Hepatic negative GI ROS, (+) Cirrhosis   Esophageal Varices and ascites  substance abuse  methamphetamine use, Hepatitis - (s/p treatment), C  Endo/Other  negative endocrine ROS  Renal/GU negative Renal ROS  negative genitourinary   Musculoskeletal   Abdominal (+)  Abdomen: soft.    Peds  Hematology negative hematology ROS (+)   Anesthesia Other Findings Past Medical History: No date: Cirrhosis of liver (HCC) No date: Heart murmur   Reproductive/Obstetrics negative OB ROS                          Anesthesia Physical  Anesthesia Plan  ASA: 3 and emergent  Anesthesia Plan: General   Post-op Pain Management:    Induction: Intravenous  PONV Risk Score and Plan: 1 and Ondansetron, Dexamethasone, Treatment may vary due to age or medical condition and  Midazolam  Airway Management Planned: LMA  Additional Equipment: None  Intra-op Plan:   Post-operative Plan: Extubation in OR  Informed Consent: I have reviewed the patients History and Physical, chart, labs and discussed the procedure including the risks, benefits and alternatives for the proposed anesthesia with the patient or authorized representative who has indicated his/her understanding and acceptance.     Dental Advisory Given  Plan Discussed with: Anesthesiologist, CRNA and Surgeon  Anesthesia Plan Comments: (Discussed risks of anesthesia with patient, including PONV, sore throat, lip/dental/eye damage. Rare risks discussed as well, such as cardiorespiratory and neurological sequelae, and allergic reactions. Discussed the role of CRNA in patient's perioperative care. Patient understands. Discussed potential increased risks with active methamphetamine use.)       Anesthesia Quick Evaluation

## 2022-05-21 NOTE — Transfer of Care (Signed)
Immediate Anesthesia Transfer of Care Note  Patient: Marco Spears  Procedure(s) Performed: IRRIGATION AND DEBRIDEMENT KNEE (Right: Knee)  Patient Location: PACU  Anesthesia Type:General  Level of Consciousness: sedated  Airway & Oxygen Therapy: Patient Spontanous Breathing  Post-op Assessment: Report given to RN and Post -op Vital signs reviewed and stable  Post vital signs: Reviewed and stable  Last Vitals:  Vitals Value Taken Time  BP 116/72 05/21/22 1445  Temp 36 C 05/21/22 1445  Pulse 66 05/21/22 1449  Resp 18 05/21/22 1449  SpO2 96 % 05/21/22 1449  Vitals shown include unvalidated device data.  Last Pain:  Vitals:   05/21/22 1315  TempSrc: Oral  PainSc: 3          Complications: No notable events documented.

## 2022-05-21 NOTE — Assessment & Plan Note (Addendum)
-   On right lateral, query oral candidiasis versus carcinoma - Lesion does not appear consistent with aphthous ulcer - Nystatin swish and swallow - Check HIV - Recommended patient follow-up with PCP if lesion does not resolve and or reoccurs patient may need referral to oncology and/or ent for outpatient biopsy - TOC place for PCP - Patient endorses understanding and compliance

## 2022-05-21 NOTE — Assessment & Plan Note (Signed)
-   Patient states he was prescribed Eliquis for lower extremity clots however he has not taken it for many weeks - Ultrasound of the bilateral lower extremity in June 2023 and the right lower extremity was read as negative for DVT - I did not resume Eliquis on admission

## 2022-05-22 ENCOUNTER — Inpatient Hospital Stay: Payer: Self-pay

## 2022-05-22 ENCOUNTER — Other Ambulatory Visit: Payer: Self-pay

## 2022-05-22 ENCOUNTER — Encounter: Payer: Self-pay | Admitting: Orthopedic Surgery

## 2022-05-22 DIAGNOSIS — M7110 Other infective bursitis, unspecified site: Secondary | ICD-10-CM | POA: Diagnosis present

## 2022-05-22 DIAGNOSIS — K7460 Unspecified cirrhosis of liver: Secondary | ICD-10-CM

## 2022-05-22 LAB — BASIC METABOLIC PANEL
Anion gap: 4 — ABNORMAL LOW (ref 5–15)
BUN: 10 mg/dL (ref 6–20)
CO2: 26 mmol/L (ref 22–32)
Calcium: 8.4 mg/dL — ABNORMAL LOW (ref 8.9–10.3)
Chloride: 106 mmol/L (ref 98–111)
Creatinine, Ser: 0.7 mg/dL (ref 0.61–1.24)
GFR, Estimated: >60 mL/min (ref >60)
Glucose, Bld: 142 mg/dL — ABNORMAL HIGH (ref 70–99)
Potassium: 4.1 mmol/L (ref 3.5–5.1)
Sodium: 136 mmol/L (ref 135–145)

## 2022-05-22 LAB — CBC
HCT: 30.8 % — ABNORMAL LOW (ref 39.0–52.0)
Hemoglobin: 10.5 g/dL — ABNORMAL LOW (ref 13.0–17.0)
MCH: 34.7 pg — ABNORMAL HIGH (ref 26.0–34.0)
MCHC: 34.1 g/dL (ref 30.0–36.0)
MCV: 101.7 fL — ABNORMAL HIGH (ref 80.0–100.0)
Platelets: 55 10*3/uL — ABNORMAL LOW (ref 150–400)
RBC: 3.03 MIL/uL — ABNORMAL LOW (ref 4.22–5.81)
RDW: 15.8 % — ABNORMAL HIGH (ref 11.5–15.5)
WBC: 5.4 10*3/uL (ref 4.0–10.5)
nRBC: 0 % (ref 0.0–0.2)

## 2022-05-22 LAB — MAGNESIUM: Magnesium: 1.9 mg/dL (ref 1.7–2.4)

## 2022-05-22 MED ORDER — KETOROLAC TROMETHAMINE 15 MG/ML IJ SOLN
15.0000 mg | Freq: Four times a day (QID) | INTRAMUSCULAR | Status: DC
  Administered 2022-05-22: 15 mg via INTRAVENOUS
  Filled 2022-05-22: qty 1

## 2022-05-22 MED ORDER — OXYCODONE HCL 5 MG PO TABS
5.0000 mg | ORAL_TABLET | ORAL | Status: DC | PRN
  Filled 2022-05-22: qty 2

## 2022-05-22 MED ORDER — FUROSEMIDE 40 MG PO TABS
40.0000 mg | ORAL_TABLET | Freq: Two times a day (BID) | ORAL | Status: DC
  Administered 2022-05-22 – 2022-05-23 (×2): 40 mg via ORAL
  Filled 2022-05-22 (×2): qty 1

## 2022-05-22 MED ORDER — FOLIC ACID 1 MG PO TABS
1.0000 mg | ORAL_TABLET | Freq: Every day | ORAL | 0 refills | Status: AC
  Filled 2022-05-22: qty 30, 30d supply, fill #0

## 2022-05-22 MED ORDER — FUROSEMIDE 40 MG PO TABS
40.0000 mg | ORAL_TABLET | Freq: Two times a day (BID) | ORAL | 2 refills | Status: DC
  Filled 2022-05-22: qty 60, 30d supply, fill #0

## 2022-05-22 MED ORDER — CEPHALEXIN 500 MG PO CAPS
500.0000 mg | ORAL_CAPSULE | Freq: Four times a day (QID) | ORAL | 0 refills | Status: DC
Start: 2022-05-22 — End: 2022-05-26
  Filled 2022-05-22: qty 56, 14d supply, fill #0

## 2022-05-22 MED ORDER — NYSTATIN 100000 UNIT/ML MT SUSP
5.0000 mL | Freq: Four times a day (QID) | OROMUCOSAL | 0 refills | Status: DC
  Filled 2022-05-22: qty 60, 3d supply, fill #0

## 2022-05-22 MED ORDER — FERROUS SULFATE 325 (65 FE) MG PO TABS
325.0000 mg | ORAL_TABLET | Freq: Every day | ORAL | 3 refills | Status: AC
Start: 2022-05-22 — End: ?
  Filled 2022-05-22: qty 30, 30d supply, fill #0

## 2022-05-22 MED ORDER — SPIRONOLACTONE 50 MG PO TABS
100.0000 mg | ORAL_TABLET | Freq: Every day | ORAL | 3 refills | Status: DC
  Filled 2022-05-22: qty 60, 30d supply, fill #0

## 2022-05-22 MED ORDER — TRAMADOL HCL 50 MG PO TABS
50.0000 mg | ORAL_TABLET | Freq: Four times a day (QID) | ORAL | Status: DC | PRN
  Administered 2022-05-22 – 2022-05-23 (×4): 100 mg via ORAL
  Administered 2022-05-23 – 2022-05-24 (×2): 50 mg via ORAL
  Administered 2022-05-24: 100 mg via ORAL
  Administered 2022-05-24: 50 mg via ORAL
  Administered 2022-05-24: 100 mg via ORAL
  Filled 2022-05-22 (×9): qty 2

## 2022-05-22 NOTE — Evaluation (Signed)
Physical Therapy Evaluation Patient Details Name: Marco Spears MRN: [id number] DOB: Jun 03, 1976 Today's Date: 05/22/2022  History of Present Illness  Pt is a  46 y.o. male with a diagnosis of right knee recurrent septic prepatellar bursitis.  Patient underwent I&D of the right prepatellar bursa on 05/07/2022.   Clinical Impression  Patient alert, agreeable to PT only after education of MD approved mobility and expectations. Pt reported he removed the KI due to it bothering his knee incision. PT and pt reviewed importance of KI at all times, several times during session. The patient reported he is homeless, not currently willing to return to his brother's home to stay.   He was able to don underwear and gown with set up assist and cues while in bed ,as well as L sock and shoe without difficulty. Attempted to place R sock and shoe with KI in place, pt unable to reach and required maxA to assist. Supine <> sit with supervision, and sit <> Stand with RW and supervision as well. He ambulated ~172ft total with RW and CGA, 1 small LOB noted that pt was able to self correct. Pt tended to lift RW to advance it, and step to gait pattern noted. He was also able to void in standing independently at end of session.  Overall the patient demonstrated deficits (see "PT Problem List") that impede the patient's functional abilities, safety, and mobility and would benefit from skilled PT intervention. Recommendation at this time is outpatient PT to maximize function, safety, and to address ongoing R knee pain, ROM, and strengthening.        Recommendations for follow up therapy are one component of a multi-disciplinary discharge planning process, led by the attending physician.  Recommendations may be updated based on patient status, additional functional criteria and insurance authorization.  Follow Up Recommendations Outpatient PT Can patient physically be transported by private vehicle: Yes    Assistance  Recommended at Discharge PRN  Patient can return home with the following  A little help with bathing/dressing/bathroom;Assist for transportation    Equipment Recommendations None recommended by PT  Recommendations for Other Services       Functional Status Assessment Patient has had a recent decline in their functional status and demonstrates the ability to make significant improvements in function in a reasonable and predictable amount of time.     Precautions / Restrictions Precautions Precautions: Fall Required Braces or Orthoses: Knee Immobilizer - Right Knee Immobilizer - Right: On at all times;On when out of bed or walking (off only with PT for short arc ROM, no more than 45 degrees) Restrictions Weight Bearing Restrictions: Yes RLE Weight Bearing: Weight bearing as tolerated      Mobility  Bed Mobility Overal bed mobility: Needs Assistance Bed Mobility: Supine to Sit     Supine to sit: Supervision          Transfers Overall transfer level: Needs assistance Equipment used: Rolling walker (2 wheels) Transfers: Sit to/from Stand Sit to Stand: Supervision           General transfer comment: with wide base of support, more reliant on LLE    Ambulation/Gait Ambulation/Gait assistance: Min guard Gait Distance (Feet): 180 Feet Assistive device: Rolling walker (2 wheels)         General Gait Details: tends to lift walker to advance it, 1 small LOB pt self corrected. step to pattern  Careers information officer  Modified Rankin (Stroke Patients Only)       Balance Overall balance assessment: Mild deficits observed, not formally tested Sitting-balance support: Feet supported Sitting balance-Leahy Scale: Good     Standing balance support: Bilateral upper extremity supported, During functional activity Standing balance-Leahy Scale: Good Standing balance comment: able to void standing at standard commode                              Pertinent Vitals/Pain Pain Assessment Pain Assessment: Faces Faces Pain Scale: Hurts a little bit    Home Living Family/patient expects to be discharged to:: Shelter/Homeless                        Prior Function Prior Level of Function : Independent/Modified Independent                     Hand Dominance        Extremity/Trunk Assessment   Upper Extremity Assessment Upper Extremity Assessment: Overall WFL for tasks assessed    Lower Extremity Assessment Lower Extremity Assessment: Overall WFL for tasks assessed RLE Deficits / Details: able to lift both extremities against gravity very well       Communication      Cognition Arousal/Alertness: Awake/alert Behavior During Therapy: WFL for tasks assessed/performed Overall Cognitive Status: Within Functional Limits for tasks assessed                                          General Comments      Exercises     Assessment/Plan    PT Assessment Patient needs continued PT services  PT Problem List Decreased strength;Decreased range of motion;Decreased activity tolerance;Decreased balance;Decreased mobility;Decreased knowledge of use of DME;Decreased safety awareness       PT Treatment Interventions DME instruction;Gait training;Functional mobility training;Therapeutic activities;Therapeutic exercise;Balance training;Neuromuscular re-education;Patient/family education    PT Goals (Current goals can be found in the Care Plan section)  Acute Rehab PT Goals Patient Stated Goal: less pain and just feel better overall PT Goal Formulation: With patient Time For Goal Achievement: 06/05/22 Potential to Achieve Goals: Good    Frequency Min 2X/week     Co-evaluation               AM-PAC PT "6 Clicks" Mobility  Outcome Measure Help needed turning from your back to your side while in a flat bed without using bedrails?: None Help needed moving from lying on your back  to sitting on the side of a flat bed without using bedrails?: None Help needed moving to and from a bed to a chair (including a wheelchair)?: None Help needed standing up from a chair using your arms (e.g., wheelchair or bedside chair)?: None Help needed to walk in hospital room?: None Help needed climbing 3-5 steps with a railing? : A Little 6 Click Score: 23    End of Session   Activity Tolerance: Patient tolerated treatment well Patient left: in bed;with call bell/phone within reach;with bed alarm set;with nursing/sitter in room Nurse Communication: Mobility status PT Visit Diagnosis: Unsteadiness on feet (R26.81);Muscle weakness (generalized) (M62.81);Difficulty in walking, not elsewhere classified (R26.2);Pain Pain - Right/Left: Right Pain - part of body: Knee    Time: 2197-5883 PT Time Calculation (min) (ACUTE ONLY): 34 min   Charges:   PT Evaluation $PT  Eval Low Complexity: 1 Low PT Treatments $Therapeutic Activity: 23-37 mins        Olga Coaster PT, DPT 9:35 AM,05/22/22

## 2022-05-22 NOTE — Plan of Care (Signed)

## 2022-05-22 NOTE — Progress Notes (Signed)
PROGRESS NOTE    Marco Spears  WEX:[id number] DOB: 28-Mar-1976 DOA: 05/21/2022 PCP: Pcp, No    Brief Narrative:   46 year old male with history of homelessness, polysubstance abuse, liver cirrhosis presumed secondary to alcohol abuse who presents to the emergency department for chief concerns of suture removal for right knee.   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  He reports he has taken Harvoni treatment for in 2017/2018.  He endorses completion of course.  Patient taken the operating room for washout and I&D of knee on 7/27.  Tolerated procedure well.  Case discussed with orthopedics on 7/28.  Surgical site looks good.  No indication for repeat surgery from orthopedic standpoint.  They are okay with discharge.  Follow-up outpatient 10 to 14 days for wound check and suture removal.  Patient has mildly decompensated hepatic cirrhosis.  He has a distended belly with lower extremity edema.  States that he did not receive his home diuretics.  These have been restarted.   Assessment & Plan:   Principal Problem:   Septic arthritis (HCC) Active Problems:   Cellulitis   Anemia   Hypokalemia   Cirrhosis of liver without ascites (HCC)   Polysubstance abuse (HCC)   Alcohol abuse   On anticoagulant therapy   Hypocalcemia   Tongue lesion  Septic bursitis Presumed secondary to antibiotic nonadherence.  Was discharged on 7/11 with Keflex 500 mg every 6 hours until 8/3.  Did not take antibiotics as prescribed.  Received Rocephin and vancomycin in ED. Plan: Continue Rocephin Follow surgical cultures Anticipate discharge on 7/28 to complete antibiotic course, will need 10 to 14 days Multimodal pain control   Hypokalemia Likely in setting of cirrhosis.  Monitor and replace as necessary   Tongue lesion - On right lateral, query oral candidiasis versus carcinoma - Lesion does not appear consistent  with aphthous ulcer - Nystatin swish and swallow - Check HIV, negative - Recommended patient follow-up with PCP if lesion does not resolve and or reoccurs patient may need referral to oncology and/or ent for outpatient biopsy - TOC place for PCP - Patient endorses understanding and compliance   Hypocalcemia - Corrected calcium is 8.8   On anticoagulant therapy - Patient states he was prescribed Eliquis for lower extremity clots however he has not taken it for many weeks - Ultrasound of the bilateral lower extremity in June 2023 and the right lower extremity was read as negative for DVT - I did not resume Eliquis on admission   Alcohol abuse - CIWA precaution  Hepatic cirrhosis with mild decompensation History of hepatitis C status post Harvoni Patient reports recent weight gain, bilateral lower extremity edema, abdominal distention.  States that he has not received his diuretics.  Reports that he had a paracentesis well last week with 7 L fluid removed.  Will place IR consult for ultrasound-guided paracentesis.  DVT prophylaxis: SQ heparin Code Status: Full code Family Communication: None today Disposition Plan: Status is: Inpatient Remains inpatient appropriate because: Septic bursitis on IV antibiotics.  Anticipated date of discharge 7/29.      Level of care: Telemetry Medical  Consultants:  Orthopedics  Procedures:  Knee I&D with washout  Antimicrobials: Rocephin   Subjective: Seen and examined peer resting in bed.  No visible distress.  No complaints of pain.  Objective: Vitals:   05/21/22 1932 05/22/22 0042 05/22/22 0443 05/22/22 0804  BP: 103/62  122/65 128/74 127/78  Pulse: 72 90 79 87  Resp: 17 19 18    Temp: 97.9 F (36.6 C) 97.8 F (36.6 C) 97.6 F (36.4 C) 98 F (36.7 C)  TempSrc:      SpO2: 100% 97% 100% 100%  Weight:      Height:        Intake/Output Summary (Last 24 hours) at 05/22/2022 1320 Last data filed at 05/22/2022 0650 Gross per 24  hour  Intake 2899 ml  Output 1400 ml  Net 1499 ml   Filed Weights   05/20/22 2022 05/21/22 1315  Weight: 117.8 kg 104.3 kg    Examination:  General exam: Appears calm and comfortable  Respiratory system: Clear to auscultation. Respiratory effort normal. Cardiovascular system: S1-S2, RRR, no murmurs, 2+ pitting edema BLE pedal edema Gastrointestinal system: Soft, mild distention, nontender, normal bowel sounds Central nervous system: Alert and oriented. No focal neurological deficits. Extremities: Symmetric 5 x 5 power. Skin: No rashes, lesions or ulcers Psychiatry: Judgement and insight appear normal. Mood & affect appropriate.     Data Reviewed: I have personally reviewed following labs and imaging studies  CBC: Recent Labs  Lab 05/20/22 2043 05/22/22 0622  WBC 3.9* 5.4  NEUTROABS 2.8  --   HGB 10.7* 10.5*  HCT 31.9* 30.8*  MCV 103.2* 101.7*  PLT 84* 55*   Basic Metabolic Panel: Recent Labs  Lab 05/20/22 2043 05/20/22 2355 05/22/22 0622  NA 138  --  136  K 2.9*  --  4.1  CL 108  --  106  CO2 26  --  26  GLUCOSE 143*  --  142*  BUN 8  --  10  CREATININE 0.74  --  0.70  CALCIUM 7.6*  --  8.4*  MG  --  1.9 1.9   GFR: Estimated Creatinine Clearance: 147.6 mL/min (by C-G formula based on SCr of 0.7 mg/dL). Liver Function Tests: Recent Labs  Lab 05/20/22 2043  AST 65*  ALT 44  ALKPHOS 134*  BILITOT 3.2*  PROT 6.2*  ALBUMIN 2.5*   Recent Labs  Lab 05/20/22 2043  LIPASE 40   No results for input(s): "AMMONIA" in the last 168 hours. Coagulation Profile: Recent Labs  Lab 05/20/22 2043  INR 1.7*   Cardiac Enzymes: Recent Labs  Lab 05/20/22 2043  CKTOTAL 55   BNP (last 3 results) No results for input(s): "PROBNP" in the last 8760 hours. HbA1C: No results for input(s): "HGBA1C" in the last 72 hours. CBG: No results for input(s): "GLUCAP" in the last 168 hours. Lipid Profile: No results for input(s): "CHOL", "HDL", "LDLCALC", "TRIG",  "CHOLHDL", "LDLDIRECT" in the last 72 hours. Thyroid Function Tests: No results for input(s): "TSH", "T4TOTAL", "FREET4", "T3FREE", "THYROIDAB" in the last 72 hours. Anemia Panel: No results for input(s): "VITAMINB12", "FOLATE", "FERRITIN", "TIBC", "IRON", "RETICCTPCT" in the last 72 hours. Sepsis Labs: Recent Labs  Lab 05/20/22 2053 05/20/22 2355  LATICACIDVEN 1.9 1.5    Recent Results (from the past 240 hour(s))  Blood culture (routine x 2)     Status: None (Preliminary result)   Collection Time: 05/20/22  8:53 PM   Specimen: BLOOD  Result Value Ref Range Status   Specimen Description BLOOD RIGHT [facility]  Final   Special Requests   Final    BOTTLES DRAWN AEROBIC AND ANAEROBIC Blood Culture results may not be optimal due to an excessive volume of blood received in culture bottles   Culture   Final    NO GROWTH 2  DAYS Performed at [facility], [address] [place], [place] [address]    Report Status PENDING  Incomplete  Aerobic/Anaerobic Culture w Gram Stain (surgical/deep wound)     Status: None (Preliminary result)   Collection Time: 05/21/22  2:07 PM   Specimen: PATH Other; Body Fluid  Result Value Ref Range Status   Specimen Description   Final    WOUND Performed at [facility], [address], [place], [place] [address]    Special Requests   Final    RIGHT LEG Performed at [facility], [address] [place], [place] [address]    Gram Stain   Final    NO SQUAMOUS EPITHELIAL CELLS SEEN FEW WBC SEEN FEW GRAM POSITIVE COCCI    Culture   Final    CULTURE REINCUBATED FOR BETTER GROWTH Performed at [facility], [address] [place], [place] [address]    Report Status PENDING  Incomplete  Culture, blood (Routine X 2) w Reflex to ID Panel     Status: None (Preliminary result)   Collection Time: 05/21/22  5:18 PM   Specimen: BLOOD  Result Value Ref Range Status   Specimen Description BLOOD RIGHT ANTECUBITAL  Final   Special  Requests   Final    BOTTLES DRAWN AEROBIC AND ANAEROBIC Blood Culture adequate volume   Culture   Final    NO GROWTH < 12 HOURS Performed at [facility], [address] [place], [place] [address]    Report Status PENDING  Incomplete         Radiology Studies: MR KNEE RIGHT WO CONTRAST  Result Date: 05/21/2022 CLINICAL DATA:  Septic arthritis suspected, knee, no prior imaging increased pain, redness, swelling, discharge from right knee wound about 2 weeks after surgery for septic bursitis; non-compliant with antibiotics EXAM: MRI OF THE RIGHT KNEE WITHOUT CONTRAST TECHNIQUE: Multiplanar, multisequence MR imaging of the knee was performed. No intravenous contrast was administered. COMPARISON:  MRI  05/06/2022 FINDINGS: Technical Note: Despite efforts by the technologist and patient, motion artifact is present on today's exam and could not be eliminated. This reduces exam sensitivity and specificity. Bones/Joint/Cartilage No acute fracture. No dislocation. Mild tricompartmental chondral irregularities, without interval progression from prior. Small knee joint effusion, similar in size to prior. Mild patchy marrow edema within the posterior aspect of the medial tibial plateau is unchanged from prior and likely reactive to overlying medial meniscal tear. There is also mild patchy marrow edema within the lateral aspect of the patella. Preservation of the fatty T1 marrow signal. No focal bone erosion. Ligaments and Menisci Oblique undersurface tear of the medial meniscal posterior horn and body. Intact lateral meniscus. Intact cruciate and collateral ligaments. Muscles and Tendons Musculotendinous structures within normal limits. Soft tissues Diffuse circumferential soft tissue edema and ill-defined fluid throughout the knee with overlying skin thickening. Focal fluid collection within the prepatellar soft tissues has increased in size now measuring approximately 4.0 x 1.0 x 3.7 cm. Collection  has communication to the skin surface anteriorly, likely related to interval incision and drainage. IMPRESSION: 1. Similar degree of diffuse circumferential soft tissue edema and ill-defined fluid throughout the knee compatible cellulitis. Focal fluid collection within the prepatellar soft tissues has increased in size now measuring approximately 4.0 x 1.0 x 3.7 cm. Collection has communication to the skin surface anteriorly, likely related to interval incision and drainage. 2. Unchanged small knee joint effusion is favored reactive secondary to internal derangement. Septic arthritis is felt to  be unlikely. 3. Mild patchy marrow edema within the lateral aspect of the patella with preservation of the T1 marrow signal. This could be reactive or reflect a mild bone contusion. No evidence to suggest acute osteomyelitis at this time. 4. 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 Electronically Signed   By: Duanne Guess D.O.   On: 05/21/2022 08:18        Scheduled Meds:  ferrous sulfate  325 mg Oral Q breakfast   folic acid  1 mg Oral Daily   furosemide  40 mg Oral BID   heparin  5,000 Units Subcutaneous Q8H   multivitamin with minerals  1 tablet Oral Daily   nystatin  5 mL Oral QID   spironolactone  100 mg Oral Daily   thiamine  100 mg Oral Daily   Or   thiamine  100 mg Intravenous Daily   Continuous Infusions:  cefTRIAXone (ROCEPHIN)  IV 2 g (05/22/22 0926)     LOS: 0 days      Tresa Moore, MD [facility]   If 7PM-7AM, please contact night-coverage  05/22/2022, 1:20 PM

## 2022-05-22 NOTE — Progress Notes (Signed)
Met with the patient to discuss DC plan He [facility] he stated that he was going to go to [facility], I asked if he has an appointment, he stated that he is supposed to go fill out papers He has a rolling walker that he got on 7/18 He plans to DC to his brothers boarding house and will need a taxi voucher to go to [address] He will get medication at [facility] I explained to him the importance of taking mediation as prescribed, he said that he did not pick it up last admission because he immediately got worse and came back,, I explained if he does not take Antibiotics as prescribed then it will get worse, he stated that he will take his medication and he just wants to sleep right now,

## 2022-05-22 NOTE — TOC Progression Note (Signed)
Transition of Care [facility]) - Progression Note    Patient Details  Name: Marco Spears MRN: [id number] Date of Birth: [birth year]  [facility]) CM/SW Contact  Marlowe Sax, RN Phone Number: 05/22/2022, 1:28 PM  Clinical Narrative:     Spoke with the patient He got the meds filled but says he lost all of his meds and does not have anymore  Expected Discharge Plan: Home/Self Care Barriers to Discharge: Continued Medical Work up  Expected Discharge Plan and Services Expected Discharge Plan: Home/Self Care   Discharge Planning Services: CM Consult   Living arrangements for the past 2 months: Homeless                 DME Arranged: N/A         HH Arranged: NA           Social Determinants of Health (SDOH) Interventions    Readmission Risk Interventions    05/06/2022    2:26 PM  Readmission Risk Prevention Plan  Transportation Screening Complete  Medication Review [employer]) Complete

## 2022-05-22 NOTE — Progress Notes (Signed)
Subjective:  POD #1 s/p repeat I&D of the right prepatellar bursa.   Patient reports right knee pain as mild at rest.  Patient evaluated by physical therapy this morning.  He will continue to use his knee immobilizer until follow-up to avoid flexion beyond 45 degrees.  Surgical culture is pending.  Patient is on IV vancomycin and Rocephin.  Objective:   VITALS:   Vitals:   05/21/22 1932 05/22/22 0042 05/22/22 0443 05/22/22 0804  BP: 103/62 122/65 128/74 127/78  Pulse: 72 90 79 87  Resp: 17 19 18    Temp: 97.9 F (36.6 C) 97.8 F (36.6 C) 97.6 F (36.4 C) 98 F (36.7 C)  TempSrc:      SpO2: 100% 97% 100% 100%  Weight:      Height:        PHYSICAL EXAM: Right knee: I personally change the patient's dressings today.  There is no active drainage from his incision.  He does not have significant redness or erythema.  He can perform short arc range of motion of the right knee without pain.  He has bilateral lower extremity edema but distally is neurovascular intact.   LABS  Results for orders placed or performed during the hospital encounter of 05/21/22 (from the past 24 hour(s))  Aerobic/Anaerobic Culture w Gram Stain (surgical/deep wound)     Status: None (Preliminary result)   Collection Time: 05/21/22  2:07 PM   Specimen: PATH Other; Body Fluid  Result Value Ref Range   Specimen Description      WOUND Performed at [facility], [address] [place], [place]    Special Requests      RIGHT LEG Performed at [facility], [address] [place], [place]    Gram Stain      NO SQUAMOUS EPITHELIAL CELLS SEEN FEW WBC SEEN FEW GRAM POSITIVE COCCI    Culture      CULTURE REINCUBATED FOR BETTER GROWTH Performed at [facility], [address], [place], [place]    Report Status PENDING   Culture, blood (Routine X 2) w Reflex to ID Panel     Status: None (Preliminary result)   Collection Time: 05/21/22  5:18 PM    Specimen: BLOOD  Result Value Ref Range   Specimen Description BLOOD RIGHT ANTECUBITAL    Special Requests      BOTTLES DRAWN AEROBIC AND ANAEROBIC Blood Culture adequate volume   Culture      NO GROWTH < 12 HOURS Performed at [facility], [address] [place], [place]    Report Status PENDING   HIV Antibody (routine testing w rflx)     Status: None   Collection Time: 05/21/22  5:18 PM  Result Value Ref Range   HIV Screen 4th Generation wRfx Non Reactive Non Reactive  Basic metabolic panel     Status: Abnormal   Collection Time: 05/22/22  6:22 AM  Result Value Ref Range   Sodium 136 135 - 145 mmol/L   Potassium 4.1 3.5 - 5.1 mmol/L   Chloride 106 98 - 111 mmol/L   CO2 26 22 - 32 mmol/L   Glucose, Bld 142 (H) 70 - 99 mg/dL   BUN 10 6 - 20 mg/dL   Creatinine, Ser 05/24/22 0.61 - 1.24 mg/dL   Calcium 8.4 (L) 8.9 - 10.3 mg/dL   GFR, Estimated 8.58 >85 mL/min   Anion gap 4 (L) 5 - 15  CBC  Status: Abnormal   Collection Time: 05/22/22  6:22 AM  Result Value Ref Range   WBC 5.4 4.0 - 10.5 K/uL   RBC 3.03 (L) 4.22 - 5.81 MIL/uL   Hemoglobin 10.5 (L) 13.0 - 17.0 g/dL   HCT 65.7 (L) 84.6 - 96.2 %   MCV 101.7 (H) 80.0 - 100.0 fL   MCH 34.7 (H) 26.0 - 34.0 pg   MCHC 34.1 30.0 - 36.0 g/dL   RDW 95.2 (H) 84.1 - 32.4 %   Platelets 55 (L) 150 - 400 K/uL   nRBC 0.0 0.0 - 0.2 %  Magnesium     Status: None   Collection Time: 05/22/22  6:22 AM  Result Value Ref Range   Magnesium 1.9 1.7 - 2.4 mg/dL    MR KNEE RIGHT WO CONTRAST  Result Date: 05/21/2022 CLINICAL DATA:  Septic arthritis suspected, knee, no prior imaging increased pain, redness, swelling, discharge from right knee wound about 2 weeks after surgery for septic bursitis; non-compliant with antibiotics EXAM: MRI OF THE RIGHT KNEE WITHOUT CONTRAST TECHNIQUE: Multiplanar, multisequence MR imaging of the knee was performed. No intravenous contrast was administered. COMPARISON:  MRI  05/06/2022 FINDINGS:  Technical Note: Despite efforts by the technologist and patient, motion artifact is present on today's exam and could not be eliminated. This reduces exam sensitivity and specificity. Bones/Joint/Cartilage No acute fracture. No dislocation. Mild tricompartmental chondral irregularities, without interval progression from prior. Small knee joint effusion, similar in size to prior. Mild patchy marrow edema within the posterior aspect of the medial tibial plateau is unchanged from prior and likely reactive to overlying medial meniscal tear. There is also mild patchy marrow edema within the lateral aspect of the patella. Preservation of the fatty T1 marrow signal. No focal bone erosion. Ligaments and Menisci Oblique undersurface tear of the medial meniscal posterior horn and body. Intact lateral meniscus. Intact cruciate and collateral ligaments. Muscles and Tendons Musculotendinous structures within normal limits. Soft tissues Diffuse circumferential soft tissue edema and ill-defined fluid throughout the knee with overlying skin thickening. Focal fluid collection within the prepatellar soft tissues has increased in size now measuring approximately 4.0 x 1.0 x 3.7 cm. Collection has communication to the skin surface anteriorly, likely related to interval incision and drainage. IMPRESSION: 1. Similar degree of diffuse circumferential soft tissue edema and ill-defined fluid throughout the knee compatible cellulitis. Focal fluid collection within the prepatellar soft tissues has increased in size now measuring approximately 4.0 x 1.0 x 3.7 cm. Collection has communication to the skin surface anteriorly, likely related to interval incision and drainage. 2. Unchanged small knee joint effusion is favored reactive secondary to internal derangement. Septic arthritis is felt to be unlikely. 3. Mild patchy marrow edema within the lateral aspect of the patella with preservation of the T1 marrow signal. This could be reactive or  reflect a mild bone contusion. No evidence to suggest acute osteomyelitis at this time. 4. 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 Electronically Signed   By: Duanne Guess D.O.   On: 05/21/2022 08:18    Assessment/Plan: 1 Day Post-Op   Principal Problem:   Septic arthritis (HCC) Active Problems:   Hypokalemia   Anemia   Cellulitis   Cirrhosis of liver without ascites (HCC)   Polysubstance abuse (HCC)   Alcohol abuse   On anticoagulant therapy   Hypocalcemia   Tongue lesion  Right knee septic bursitis  Continue IV antibiotics.  Once cleared medically he can be discharged from an orthopedic standpoint.  Patient bounded to the hospital as he was not taking his oral antibiotics.  Patient will follow-up in my office in 10-14 days for wound check and suture removal.  Patient may be weightbearing as tolerated on the right lower extremity but recommend he continue to use the knee immobilizer to avoid knee flexion beyond 45 degrees which may cause surgical incision dehiscence.  Patient should elevate and ice his right knee whenever possible.  I will defer to the hospitalist service regarding treatment of patient's liver cirrhosis and bilateral lower extremity edema which is not related to his right knee septic bursitis.  Patient had no clinical evidence for a right septic knee joint.  There was no large effusion.  Patient did not have significant pain with knee flexion extension.  There is no large effusion to even aspirate.  Patient's diagnosis is a right septic knee bursitis, not septic arthritis.    Juanell Fairly , MD 05/22/2022, 12:25 PM

## 2022-05-22 NOTE — Procedures (Signed)
PROCEDURE SUMMARY:  Successful ultrasound guided paracentesis from the RLQ. Yielded 3.6 L of hazy, yellow fluid.  No immediate complications.  The patient tolerated the procedure well.   Specimen not sent for labs.  EBL < 32mL  If the patient eventually requires >/=2 paracenteses in a 30 day period, screening evaluation by the [facility] will be assessed.    Alex Gardener, AGNP-BC 05/22/2022, 3:42 PM

## 2022-05-23 DIAGNOSIS — K7031 Alcoholic cirrhosis of liver with ascites: Secondary | ICD-10-CM

## 2022-05-23 MED ORDER — GABAPENTIN 400 MG PO CAPS
400.0000 mg | ORAL_CAPSULE | Freq: Three times a day (TID) | ORAL | Status: DC
  Administered 2022-05-23 – 2022-05-26 (×10): 400 mg via ORAL
  Filled 2022-05-23 (×10): qty 1

## 2022-05-23 MED ORDER — FUROSEMIDE 10 MG/ML IJ SOLN
40.0000 mg | Freq: Two times a day (BID) | INTRAMUSCULAR | Status: DC
  Administered 2022-05-23 – 2022-05-26 (×6): 40 mg via INTRAVENOUS
  Filled 2022-05-23 (×6): qty 4

## 2022-05-23 MED ORDER — METOLAZONE 5 MG PO TABS
5.0000 mg | ORAL_TABLET | Freq: Once | ORAL | Status: AC
  Administered 2022-05-23: 5 mg via ORAL
  Filled 2022-05-23: qty 1

## 2022-05-23 NOTE — Progress Notes (Signed)
PROGRESS NOTE    Marco Spears  JJH:[id number] DOB: [birth year] DOA: 05/21/2022 PCP: Pcp, No    Brief Narrative:   46 year old male with history of homelessness, polysubstance abuse, liver cirrhosis presumed secondary to alcohol abuse who presents to the emergency department for chief concerns of suture removal for right knee.   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  He reports he has taken Har[facility]oni treatment for in 2017/2018.  He endorses completion of course.  Patient taken the operating room for washout and I&D of knee on 7/27.  Tolerated procedure well.  Case discussed with orthopedics on 7/28.  Surgical site looks good.  No indication for repeat surgery from orthopedic standpoint.  They are okay with discharge.  Follow-up outpatient 10 to 14 days for wound check and suture removal.  Patient has mildly decompensated hepatic cirrhosis.  He has a distended belly with lower extremity edema.  States that he did not receive his home diuretics.  These have been restarted.  Underwent image guided paracentesis on 7/28.  Yielded 3.6 L hazy yellow fluid.  No complications.  Appreciate interventional radiology assistance.   Assessment & Plan:   Principal Problem:   Septic arthritis (HCC) Active Problems:   Cellulitis   Anemia   Hypokalemia   Cirrhosis of liver without ascites (HCC)   Polysubstance abuse (HCC)   Alcohol abuse   On anticoagulant therapy   Hypocalcemia   Tongue lesion   Septic bursitis  Septic bursitis Presumed secondary to antibiotic nonadherence.  Was discharged on 7/11 with Keflex 500 mg every 6 hours until 8/3.  Did not take antibiotics as prescribed.  Received Rocephin and vancomycin in ED. seen by orthopedics on postprocedure day #1.  Presentation inconsistent with septic arthritis.  Okay for discharge from orthopedic standpoint. Plan: Continue Rocephin Follow surgical  cultures Anticipate transition to p.o. Keflex at time of discharge Multimodal pain control, patient only wants tramadol  Hepatic cirrhosis with mild decompensation History of hepatitis C status post Harvoni Patient reports recent weight gain, bilateral lower extremity edema, abdominal distention.  States that he has not received his diuretics.  Reports that he had a paracentesis well last week with 7 L fluid removed.  Repeat paracentesis on 7/28.  Yielded 3.6 L hazy fluid.  Specimens not sent for labs.  I suspect medication nonadherence is main etiology for decompensation. Plan: Switch to IV diuretic today, Lasix 40 mg IV twice daily Continue home Aldactone 100 mg daily   Hypokalemia Likely in setting of cirrhosis.  Monitor and replace as necessary   Tongue lesion - On right lateral, query oral candidiasis versus carcinoma - Lesion does not appear consistent with aphthous ulcer - Nystatin swish and swallow - Check HIV, negative - Recommended patient follow-up with PCP if lesion does not resolve and or reoccurs patient may need referral to oncology and/or ent for outpatient biopsy - TOC place for PCP - Patient endorses understanding and compliance   Hypocalcemia - Corrected calcium is 8.8   On anticoagulant therapy - Patient states he was prescribed Eliquis for lower extremity clots however he has not taken it for many weeks - Ultrasound of the bilateral lower extremity in June 2023 and the right lower extremity was read as negative for DVT - I did not resume Eliquis on admission   Alcohol abuse - CIWA precaution    DVT prophylaxis: SQ heparin Code Status: Full  code Family Communication: None today Disposition Plan: Status is: Inpatient Remains inpatient appropriate because: Septic bursitis on IV antibiotics.  Hepatic cirrhosis with mild decompensation.  Attempting IV diuresis.  If volume status improved anticipate discharge 7/30.      Level of care:  Med-Surg  Consultants:  Orthopedics  Procedures:  Knee I&D with washout  Antimicrobials: Rocephin   Subjective: Seen and examined peer resting in bed.  No visible distress.  No complaints of pain.  Endorses persistent swelling of lower extremities.  Objective: Vitals:   05/22/22 1452 05/22/22 1540 05/22/22 1941 05/23/22 0907  BP: (!) 123/57 105/60 117/71 111/66  Pulse: 84 85 82 99  Resp:   18   Temp:   98.5 F (36.9 C) 98 F (36.7 C)  TempSrc:   Oral   SpO2: 100% 100% 98% 98%  Weight:      Height:        Intake/Output Summary (Last 24 hours) at 05/23/2022 1232 Last data filed at 05/23/2022 0314 Gross per 24 hour  Intake 4.71 ml  Output --  Net 4.71 ml   Filed Weights   05/20/22 2022 05/21/22 1315  Weight: 117.8 kg 104.3 kg    Examination:  General exam: NAD Respiratory system: Clear to auscultation. Respiratory effort normal. Cardiovascular system: S1-S2, RRR, no murmurs, 2+ pitting edema BLE Gastrointestinal system: Soft, mild distention, nontender, normal bowel sounds Central nervous system: Alert and oriented. No focal neurological deficits. Extremities: Symmetric 5 x 5 power. Skin: No rashes, lesions or ulcers Psychiatry: Judgement and insight appear normal. Mood & affect appropriate.     Data Reviewed: I have personally reviewed following labs and imaging studies  CBC: Recent Labs  Lab 05/20/22 2043 05/22/22 0622  WBC 3.9* 5.4  NEUTROABS 2.8  --   HGB 10.7* 10.5*  HCT 31.9* 30.8*  MCV 103.2* 101.7*  PLT 84* 55*   Basic Metabolic Panel: Recent Labs  Lab 05/20/22 2043 05/20/22 2355 05/22/22 0622  NA 138  --  136  K 2.9*  --  4.1  CL 108  --  106  CO2 26  --  26  GLUCOSE 143*  --  142*  BUN 8  --  10  CREATININE 0.74  --  0.70  CALCIUM 7.6*  --  8.4*  MG  --  1.9 1.9   GFR: Estimated Creatinine Clearance: 147.6 mL/min (by C-G formula based on SCr of 0.7 mg/dL). Liver Function Tests: Recent Labs  Lab 05/20/22 2043  AST 65*   ALT 44  ALKPHOS 134*  BILITOT 3.2*  PROT 6.2*  ALBUMIN 2.5*   Recent Labs  Lab 05/20/22 2043  LIPASE 40   No results for input(s): "AMMONIA" in the last 168 hours. Coagulation Profile: Recent Labs  Lab 05/20/22 2043  INR 1.7*   Cardiac Enzymes: Recent Labs  Lab 05/20/22 2043  CKTOTAL 55   BNP (last 3 results) No results for input(s): "PROBNP" in the last 8760 hours. HbA1C: No results for input(s): "HGBA1C" in the last 72 hours. CBG: No results for input(s): "GLUCAP" in the last 168 hours. Lipid Profile: No results for input(s): "CHOL", "HDL", "LDLCALC", "TRIG", "CHOLHDL", "LDLDIRECT" in the last 72 hours. Thyroid Function Tests: No results for input(s): "TSH", "T4TOTAL", "FREET4", "T3FREE", "THYROIDAB" in the last 72 hours. Anemia Panel: No results for input(s): "VITAMINB12", "FOLATE", "FERRITIN", "TIBC", "IRON", "RETICCTPCT" in the last 72 hours. Sepsis Labs: Recent Labs  Lab 05/20/22 2053 05/20/22 2355  LATICACIDVEN 1.9 1.5    Recent Results (from  the past 240 hour(s))  Blood culture (routine x 2)     Status: None (Preliminary result)   Collection Time: 05/20/22  8:53 PM   Specimen: BLOOD  Result Value Ref Range Status   Specimen Description BLOOD RIGHT AC  Final   Special Requests   Final    BOTTLES DRAWN AEROBIC AND ANAEROBIC Blood Culture results may not be optimal due to an excessive volume of blood received in culture bottles   Culture   Final    NO GROWTH 3 DAYS Performed at [facility], [address], [place], [place] [address]    Report Status PENDING  Incomplete  Aerobic/Anaerobic Culture w Gram Stain (surgical/deep wound)     Status: None (Preliminary result)   Collection Time: 05/21/22  2:07 PM   Specimen: PATH Other; Body Fluid  Result Value Ref Range Status   Specimen Description   Final    WOUND Performed at [facility], [address], [place], [place] [address]    Special Requests   Final    RIGHT  LEG Performed at [facility], [address] [place], [place] [address]    Gram Stain   Final    NO SQUAMOUS EPITHELIAL CELLS SEEN FEW WBC SEEN FEW GRAM POSITIVE COCCI    Culture   Final    CULTURE REINCUBATED FOR BETTER GROWTH Performed at [facility], [address], [place], [place] [address]    Report Status PENDING  Incomplete  Culture, blood (Routine X 2) w Reflex to ID Panel     Status: None (Preliminary result)   Collection Time: 05/21/22  5:18 PM   Specimen: BLOOD  Result Value Ref Range Status   Specimen Description BLOOD RIGHT ANTECUBITAL  Final   Special Requests   Final    BOTTLES DRAWN AEROBIC AND ANAEROBIC Blood Culture adequate volume   Culture   Final    NO GROWTH 2 DAYS Performed at [facility], [address] [place], [place] [address]    Report Status PENDING  Incomplete         Radiology Studies: US Paracentesis  Result Date: 05/22/2022 INDICATION: Patient with history of polysubstance abuse, alcoholic cirrhosis and HCV infection. Patient referred to IR for therapeutic paracentesis for ascites. EXAM: ULTRASOUND GUIDED THERAPEUTIC LEFT UPPER QUADRANT PARACENTESIS MEDICATIONS: 10 mL 1 % lidocaine COMPLICATIONS: None immediate. PROCEDURE: Informed written consent was obtained from the patient after a discussion of the risks, benefits and alternatives to treatment. A timeout was performed prior to the initiation of the procedure. Initial ultrasound scanning demonstrates a moderate amount of ascites within the left upper abdominal quadrant. The left upper abdomen was prepped and draped in the usual sterile fashion. 1% lidocaine was used for local anesthesia. Following this, a 6 Fr Safe-T-Centesis catheter was introduced. An ultrasound image was saved for documentation purposes. The paracentesis was performed. The catheter was removed and a dressing was applied. The patient tolerated the procedure well without immediate post procedural  complication. Patient received post-procedure intravenous albumin; see nursing notes for details. FINDINGS: A total of approximately 3.6 L of hazy, yellow fluid was removed. IMPRESSION: Successful ultrasound-guided paracentesis yielding 3.6 liters of peritoneal fluid. PLAN: If the patient eventually requires >/=2 paracenteses in a 30 day period, candidacy for formal evaluation by the [facility] Interventional Radiology Portal Hypertension Clinic will be assessed. Read by: Alex Gardener, AGNP-BC Electronically Signed   By: Olive Bass M.D.   On: 05/22/2022 15:59  Scheduled Meds:  ferrous sulfate  325 mg Oral Q breakfast   folic acid  1 mg Oral Daily   furosemide  40 mg Intravenous BID   gabapentin  400 mg Oral TID   heparin  5,000 Units Subcutaneous Q8H   multivitamin with minerals  1 tablet Oral Daily   nystatin  5 mL Oral QID   spironolactone  100 mg Oral Daily   thiamine  100 mg Oral Daily   Or   thiamine  100 mg Intravenous Daily   Continuous Infusions:  cefTRIAXone (ROCEPHIN)  IV 2 g (05/23/22 1048)     LOS: 1 day      Tresa Moore, MD [facility]   If 7PM-7AM, please contact night-coverage  05/23/2022, 12:32 PM

## 2022-05-23 NOTE — Plan of Care (Signed)

## 2022-05-23 NOTE — Progress Notes (Signed)
Subjective: 2 Days Post-Op Procedure(s) (LRB): IRRIGATION AND DEBRIDEMENT KNEE (Right) Patient is awake alert and ambulating to the bathroom is a come see him.  Says his pain is some better.  He is doing well otherwise.  Still has some swelling in the legs.  Hemoglobin is 10.5.  White blood count is 5400.  He remains afebrile.  Patient reports pain as mild.  Objective:   VITALS:   Vitals:   05/22/22 1941 05/23/22 0907  BP: 117/71 111/66  Pulse: 82 99  Resp: 18   Temp: 98.5 F (36.9 C) 98 F (36.7 C)  SpO2: 98% 98%    Neurologically intact Incision: scant drainage  LABS Recent Labs    05/20/22 2043 05/22/22 0622  HGB 10.7* 10.5*  HCT 31.9* 30.8*  WBC 3.9* 5.4  PLT 84* 55*    Recent Labs    05/20/22 2043 05/22/22 0622  NA 138 136  K 2.9* 4.1  BUN 8 10  CREATININE 0.74 0.70  GLUCOSE 143* 142*    Recent Labs    05/20/22 2043  INR 1.7*     Assessment/Plan: 2 Days Post-Op Procedure(s) (LRB): IRRIGATION AND DEBRIDEMENT KNEE (Right)   Advance diet Continue ABX therapy due to Post-op infection Change dressings daily. Dr. Martha Clan will recheck him on Monday.

## 2022-05-23 NOTE — Plan of Care (Signed)

## 2022-05-24 LAB — CBC WITH DIFFERENTIAL/PLATELET
Abs Immature Granulocytes: 0.01 10*3/uL (ref 0.00–0.07)
Basophils Absolute: 0 10*3/uL (ref 0.0–0.1)
Basophils Relative: 1 %
Eosinophils Absolute: 0.3 10*3/uL (ref 0.0–0.5)
Eosinophils Relative: 7 %
HCT: 31.2 % — ABNORMAL LOW (ref 39.0–52.0)
Hemoglobin: 10.2 g/dL — ABNORMAL LOW (ref 13.0–17.0)
Immature Granulocytes: 0 %
Lymphocytes Relative: 18 %
Lymphs Abs: 0.7 10*3/uL (ref 0.7–4.0)
MCH: 34.1 pg — ABNORMAL HIGH (ref 26.0–34.0)
MCHC: 32.7 g/dL (ref 30.0–36.0)
MCV: 104.3 fL — ABNORMAL HIGH (ref 80.0–100.0)
Monocytes Absolute: 0.5 10*3/uL (ref 0.1–1.0)
Monocytes Relative: 13 %
Neutro Abs: 2.4 10*3/uL (ref 1.7–7.7)
Neutrophils Relative %: 61 %
Platelets: 57 10*3/uL — ABNORMAL LOW (ref 150–400)
RBC: 2.99 MIL/uL — ABNORMAL LOW (ref 4.22–5.81)
RDW: 16.7 % — ABNORMAL HIGH (ref 11.5–15.5)
WBC: 3.9 10*3/uL — ABNORMAL LOW (ref 4.0–10.5)
nRBC: 0 % (ref 0.0–0.2)

## 2022-05-24 LAB — BASIC METABOLIC PANEL
Anion gap: 3 — ABNORMAL LOW (ref 5–15)
BUN: 11 mg/dL (ref 6–20)
CO2: 28 mmol/L (ref 22–32)
Calcium: 7.9 mg/dL — ABNORMAL LOW (ref 8.9–10.3)
Chloride: 101 mmol/L (ref 98–111)
Creatinine, Ser: 0.75 mg/dL (ref 0.61–1.24)
GFR, Estimated: >60 mL/min (ref >60)
Glucose, Bld: 107 mg/dL — ABNORMAL HIGH (ref 70–99)
Potassium: 3.7 mmol/L (ref 3.5–5.1)
Sodium: 132 mmol/L — ABNORMAL LOW (ref 135–145)

## 2022-05-24 LAB — MAGNESIUM: Magnesium: 1.9 mg/dL (ref 1.7–2.4)

## 2022-05-24 MED ORDER — POTASSIUM CHLORIDE CRYS ER 20 MEQ PO TBCR
40.0000 meq | EXTENDED_RELEASE_TABLET | Freq: Once | ORAL | Status: AC
Start: 2022-05-24 — End: 2022-05-24
  Administered 2022-05-24: 40 meq via ORAL
  Filled 2022-05-24: qty 2

## 2022-05-24 MED ORDER — METOLAZONE 5 MG PO TABS
5.0000 mg | ORAL_TABLET | Freq: Once | ORAL | Status: AC
  Administered 2022-05-24: 5 mg via ORAL
  Filled 2022-05-24: qty 1

## 2022-05-24 MED ORDER — MAGNESIUM SULFATE 2 GM/50ML IV SOLN
2.0000 g | Freq: Once | INTRAVENOUS | Status: AC
  Administered 2022-05-24: 2 g via INTRAVENOUS
  Filled 2022-05-24: qty 50

## 2022-05-24 NOTE — Plan of Care (Signed)
°  Problem: Education: °Goal: Knowledge of General Education information will improve °Description: Including pain rating scale, medication(s)/side effects and non-pharmacologic comfort measures °Outcome: Progressing °  °Problem: Clinical Measurements: °Goal: Ability to maintain clinical measurements within normal limits will improve °Outcome: Progressing °Goal: Will remain free from infection °Outcome: Progressing °Goal: Diagnostic test results will improve °Outcome: Progressing °Goal: Respiratory complications will improve °Outcome: Progressing °Goal: Cardiovascular complication will be avoided °Outcome: Progressing °  °Problem: Activity: °Goal: Risk for activity intolerance will decrease °Outcome: Progressing °  °Problem: Nutrition: °Goal: Adequate nutrition will be maintained °Outcome: Progressing °  °Problem: Elimination: °Goal: Will not experience complications related to bowel motility °Outcome: Progressing °Goal: Will not experience complications related to urinary retention °Outcome: Progressing °  °

## 2022-05-24 NOTE — Plan of Care (Signed)

## 2022-05-24 NOTE — Progress Notes (Signed)
PROGRESS NOTE    Marco Spears  [id number] DOB: 1976-01-21 DOA: 05/21/2022 PCP: Pcp, No    Brief Narrative:   46 year old male with history of homelessness, polysubstance abuse, liver cirrhosis presumed secondary to alcohol abuse who presents to the emergency department for chief concerns of suture removal for right knee.   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  He reports he has taken [place] treatment for in 2017/2018.  He endorses completion of course.  Patient taken the operating room for washout and I&D of knee on 7/27.  Tolerated procedure well.  Case discussed with orthopedics on 7/28.  Surgical site looks good.  No indication for repeat surgery from orthopedic standpoint.  They are okay with discharge.  Follow-up outpatient 10 to 14 days for wound check and suture removal.  Patient has mildly decompensated hepatic cirrhosis.  He has a distended belly with lower extremity edema.  States that he did not receive his home diuretics.  These have been restarted.  Underwent image guided paracentesis on 7/28.  Yielded 3.6 L hazy yellow fluid.  No complications.  Appreciate interventional radiology assistance.   Assessment & Plan:   Principal Problem:   Septic arthritis ([place]) Active Problems:   Cellulitis   Anemia   Hypokalemia   Cirrhosis of liver without ascites (HCC)   Polysubstance abuse (HCC)   Alcohol abuse   On anticoagulant therapy   Hypocalcemia   Tongue lesion   Septic bursitis  Septic bursitis Presumed secondary to antibiotic nonadherence.  Was discharged on 7/11 with Keflex 500 mg every 6 hours until 8/3.  Did not take antibiotics as prescribed.  Received Rocephin and vancomycin in ED. seen by orthopedics on postprocedure day #1.  Presentation inconsistent with septic arthritis.  Okay for discharge from orthopedic standpoint. Plan: Continue Rocephin while inpatient Follow  surgical cultures, no growth to date Anticipate transition to p.o. Keflex at time of discharge Multimodal pain control, patient only wants tramadol  Hepatic cirrhosis with mild decompensation History of hepatitis C status post Harvoni Thrombocytopenia Patient reports recent weight gain, bilateral lower extremity edema, abdominal distention.  States that he has not received his diuretics.  Reports that he had a paracentesis well last week with 7 L fluid removed.  Repeat paracentesis on 7/28.  Yielded 3.6 L hazy fluid.  Specimens not sent for labs.  I suspect medication nonadherence is main etiology for decompensation. Plan: Continue IV diuretic for today 40 mg Lasix twice daily Continue home Aldactone 100 mg daily Metolazone 5 mg p.o. x1   Hypokalemia Likely in setting of cirrhosis.  Monitor and replace as necessary   Tongue lesion - On right lateral, query oral candidiasis versus carcinoma - Lesion does not appear consistent with aphthous ulcer - Nystatin swish and swallow - Check HIV, negative - Recommended patient follow-up with PCP if lesion does not resolve and or reoccurs patient may need referral to oncology and/or ent for outpatient biopsy - TOC place for PCP - Patient endorses understanding and compliance   Hypocalcemia - Corrected calcium is 8.8   On anticoagulant therapy - Patient states he was prescribed Eliquis for lower extremity clots however he has not taken it for many weeks - Ultrasound of the bilateral lower extremity in June 2023 and the right lower extremity was read as negative for DVT - I did not resume Eliquis on admission   Alcohol abuse - CIWA  precaution    DVT prophylaxis: SQ heparin Code Status: Full code Family Communication: None today Disposition Plan: Status is: Inpatient Remains inpatient appropriate because: Septic bursitis on IV antibiotics.  Hepatic cirrhosis with mild decompensation.  Attempting IV diuresis.  If volume status improved  anticipate discharge 7/31      Level of care: Med-Surg  Consultants:  Orthopedics  Procedures:  Knee I&D with washout  Antimicrobials: Rocephin   Subjective: Seen and examined peer resting in bed.  No visible distress.  No complaints of pain.  Endorses persistent swelling of lower extremities.  Objective: Vitals:   05/23/22 1643 05/23/22 2037 05/24/22 0434 05/24/22 0724  BP: 118/73 114/69 114/74 110/68  Pulse: 83 79 76 72  Resp:  17 17 19   Temp: 97.9 F (36.6 C) 98.3 F (36.8 C)  98.1 F (36.7 C)  TempSrc:      SpO2: 99% 98% 95% 96%  Weight:      Height:        Intake/Output Summary (Last 24 hours) at 05/24/2022 1143 Last data filed at 05/24/2022 0333 Gross per 24 hour  Intake 2605.29 ml  Output --  Net 2605.29 ml   Filed Weights   05/20/22 2022 05/21/22 1315  Weight: 117.8 kg 104.3 kg    Examination:  General exam: No acute distress Respiratory system: Clear to auscultation. Respiratory effort normal. Cardiovascular system: S1-S2, RRR, no murmurs, 2+ pitting edema BLE Gastrointestinal system: Soft, mild distention, nontender, normal bowel sounds Central nervous system: Alert and oriented. No focal neurological deficits. Extremities: Symmetric 5 x 5 power. Skin: No rashes, lesions or ulcers Psychiatry: Judgement and insight appear normal. Mood & affect appropriate.     Data Reviewed: I have personally reviewed following labs and imaging studies  CBC: Recent Labs  Lab 05/20/22 2043 05/22/22 0622 05/24/22 0834  WBC 3.9* 5.4 3.9*  NEUTROABS 2.8  --  2.4  HGB 10.7* 10.5* 10.2*  HCT 31.9* 30.8* 31.2*  MCV 103.2* 101.7* 104.3*  PLT 84* 55* 57*   Basic Metabolic Panel: Recent Labs  Lab 05/20/22 2043 05/20/22 2355 05/22/22 0622 05/24/22 0834  NA 138  --  136 132*  K 2.9*  --  4.1 3.7  CL 108  --  106 101  CO2 26  --  26 28  GLUCOSE 143*  --  142* 107*  BUN 8  --  10 11  CREATININE 0.74  --  0.70 0.75  CALCIUM 7.6*  --  8.4* 7.9*  MG   --  1.9 1.9 1.9   GFR: Estimated Creatinine Clearance: 147.6 mL/min (by C-G formula based on SCr of 0.75 mg/dL). Liver Function Tests: Recent Labs  Lab 05/20/22 2043  AST 65*  ALT 44  ALKPHOS 134*  BILITOT 3.2*  PROT 6.2*  ALBUMIN 2.5*   Recent Labs  Lab 05/20/22 2043  LIPASE 40   No results for input(s): "AMMONIA" in the last 168 hours. Coagulation Profile: Recent Labs  Lab 05/20/22 2043  INR 1.7*   Cardiac Enzymes: Recent Labs  Lab 05/20/22 2043  CKTOTAL 55   BNP (last 3 results) No results for input(s): "PROBNP" in the last 8760 hours. HbA1C: No results for input(s): "HGBA1C" in the last 72 hours. CBG: No results for input(s): "GLUCAP" in the last 168 hours. Lipid Profile: No results for input(s): "CHOL", "HDL", "LDLCALC", "TRIG", "CHOLHDL", "LDLDIRECT" in the last 72 hours. Thyroid Function Tests: No results for input(s): "TSH", "T4TOTAL", "FREET4", "T3FREE", "THYROIDAB" in the last 72 hours. Anemia Panel: No  results for input(s): "VITAMINB12", "FOLATE", "FERRITIN", "TIBC", "IRON", "RETICCTPCT" in the last 72 hours. Sepsis Labs: Recent Labs  Lab 05/20/22 2053 05/20/22 2355  LATICACIDVEN 1.9 1.5    Recent Results (from the past 240 hour(s))  Blood culture (routine x 2)     Status: None (Preliminary result)   Collection Time: 05/20/22  8:53 PM   Specimen: BLOOD  Result Value Ref Range Status   Specimen Description BLOOD RIGHT [facility]  Final   Special Requests   Final    BOTTLES DRAWN AEROBIC AND ANAEROBIC Blood Culture results may not be optimal due to an excessive volume of blood received in culture bottles   Culture   Final    NO GROWTH 4 DAYS Performed at [facility], [address] [place], [place] [address]    Report Status PENDING  Incomplete  Aerobic/Anaerobic Culture w Gram Stain (surgical/deep wound)     Status: None (Preliminary result)   Collection Time: 05/21/22  2:07 PM   Specimen: PATH Other; Body Fluid  Result Value Ref  Range Status   Specimen Description   Final    WOUND Performed at [facility], [address] [place], [place] [address]    Special Requests   Final    RIGHT LEG Performed at [facility], [address] [place], [place] [address]    Gram Stain   Final    NO SQUAMOUS EPITHELIAL CELLS SEEN FEW WBC SEEN FEW GRAM POSITIVE COCCI Performed at [facility], [address], [place], [place] [address]    Culture   Final    CULTURE REINCUBATED FOR BETTER GROWTH NO ANAEROBES ISOLATED; CULTURE IN PROGRESS FOR 5 DAYS    Report Status PENDING  Incomplete  Culture, blood (Routine X 2) w Reflex to ID Panel     Status: None (Preliminary result)   Collection Time: 05/21/22  5:18 PM   Specimen: BLOOD  Result Value Ref Range Status   Specimen Description BLOOD RIGHT ANTECUBITAL  Final   Special Requests   Final    BOTTLES DRAWN AEROBIC AND ANAEROBIC Blood Culture adequate volume   Culture   Final    NO GROWTH 3 DAYS Performed at [facility], [address] [place], [place] [address]    Report Status PENDING  Incomplete         Radiology Studies: US Paracentesis  Result Date: 05/22/2022 INDICATION: Patient with history of polysubstance abuse, alcoholic cirrhosis and HCV infection. Patient referred to IR for therapeutic paracentesis for ascites. EXAM: ULTRASOUND GUIDED THERAPEUTIC LEFT UPPER QUADRANT PARACENTESIS MEDICATIONS: 10 mL 1 % lidocaine COMPLICATIONS: None immediate. PROCEDURE: Informed written consent was obtained from the patient after a discussion of the risks, benefits and alternatives to treatment. A timeout was performed prior to the initiation of the procedure. Initial ultrasound scanning demonstrates a moderate amount of ascites within the left upper abdominal quadrant. The left upper abdomen was prepped and draped in the usual sterile fashion. 1% lidocaine was used for local anesthesia. Following this, a 6 Fr Safe-T-Centesis catheter was  introduced. An ultrasound image was saved for documentation purposes. The paracentesis was performed. The catheter was removed and a dressing was applied. The patient tolerated the procedure well without immediate post procedural complication. Patient received post-procedure intravenous albumin; see nursing notes for details. FINDINGS: A total of approximately 3.6 L of hazy, yellow fluid was removed. IMPRESSION: Successful ultrasound-guided paracentesis yielding 3.6 liters of peritoneal fluid. PLAN: If the patient eventually requires >/=2 paracenteses  in a 30 day period, candidacy for formal evaluation by the [facility] Interventional Radiology Portal Hypertension Clinic will be assessed. Read by: Alex Gardener, AGNP-BC Electronically Signed   By: Olive Bass M.D.   On: 05/22/2022 15:59        Scheduled Meds:  ferrous sulfate  325 mg Oral Q breakfast   folic acid  1 mg Oral Daily   furosemide  40 mg Intravenous BID   gabapentin  400 mg Oral TID   heparin  5,000 Units Subcutaneous Q8H   metolazone  5 mg Oral Once   multivitamin with minerals  1 tablet Oral Daily   potassium chloride  40 mEq Oral Once   spironolactone  100 mg Oral Daily   thiamine  100 mg Oral Daily   Or   thiamine  100 mg Intravenous Daily   Continuous Infusions:  cefTRIAXone (ROCEPHIN)  IV 2 g (05/24/22 0950)   magnesium sulfate bolus IVPB       LOS: 2 days      Tresa Moore, MD [facility]   If 7PM-7AM, please contact night-coverage  05/24/2022, 11:43 AM

## 2022-05-25 LAB — CULTURE, BLOOD (ROUTINE X 2): Culture: NO GROWTH

## 2022-05-25 MED ORDER — GUAIFENESIN-DM 100-10 MG/5ML PO SYRP
5.0000 mL | ORAL_SOLUTION | ORAL | Status: DC | PRN
  Administered 2022-05-25: 5 mL via ORAL
  Filled 2022-05-25: qty 5

## 2022-05-25 MED ORDER — PHENOL 1.4 % MT LIQD
1.0000 | OROMUCOSAL | Status: DC | PRN
  Administered 2022-05-25: 1 via OROMUCOSAL
  Filled 2022-05-25: qty 177

## 2022-05-25 NOTE — Plan of Care (Signed)

## 2022-05-25 NOTE — Progress Notes (Signed)
Physical Therapy Treatment Patient Details Name: Marco Spears MRN: [id number] DOB: 1976-08-01 Today's Date: 05/25/2022   History of Present Illness Pt is a  46 y.o. male with a diagnosis of right knee recurrent septic prepatellar bursitis.  Patient underwent I&D of the right prepatellar bursa on 05/07/2022.    PT Comments    Patient alert, oriented, agreeable to PT with some encouragement (was sleeping prior to PT arrival). The patient demonstrated good progress towards goals, but does still remain non compliant with KI. He was able to don this prior to mobility today without assistance, just some verbal cues. modI for bed mobility and transfers without RW today. He ambulated ~179ft, no LOB noted but he did endorse some dizziness towards the end of his walk that he said was due to medication. CGA-supervision for safety. Pt up in chair at end of session with all needs in reach. The patient would benefit from further skilled PT intervention to continue to progress towards goals. Recommendation remains appropriate.     Recommendations for follow up therapy are one component of a multi-disciplinary discharge planning process, led by the attending physician.  Recommendations may be updated based on patient status, additional functional criteria and insurance authorization.  Follow Up Recommendations  Outpatient PT Can patient physically be transported by private vehicle: Yes   Assistance Recommended at Discharge PRN  Patient can return home with the following A little help with bathing/dressing/bathroom;Assist for transportation   Equipment Recommendations  None recommended by PT    Recommendations for Other Services       Precautions / Restrictions Precautions Precautions: Fall Required Braces or Orthoses: Knee Immobilizer - Right Knee Immobilizer - Right: On at all times;On when out of bed or walking (off only with PT for short arc ROM, no more than 45 degrees) Restrictions Weight  Bearing Restrictions: Yes RLE Weight Bearing: Weight bearing as tolerated     Mobility  Bed Mobility Overal bed mobility: Modified Independent                  Transfers Overall transfer level: Modified independent Equipment used: None               General transfer comment: extended time, use of UE support    Ambulation/Gait Ambulation/Gait assistance: Min guard, Supervision Gait Distance (Feet): 180 Feet Assistive device: None         General Gait Details: pt walks without LOB, but did endorse some dizziness, reported it was medication related.   Stairs             Wheelchair Mobility    Modified Rankin (Stroke Patients Only)       Balance Overall balance assessment: Mild deficits observed, not formally tested Sitting-balance support: Feet supported Sitting balance-Leahy Scale: Good       Standing balance-Leahy Scale: Good                              Cognition Arousal/Alertness: Awake/alert Behavior During Therapy: WFL for tasks assessed/performed Overall Cognitive Status: Within Functional Limits for tasks assessed                                          Exercises      General Comments        Pertinent Vitals/Pain Pain Assessment Pain Assessment: No/denies pain  Home Living                          Prior Function            PT Goals (current goals can now be found in the care plan section) Progress towards PT goals: Progressing toward goals    Frequency    Min 2X/week      PT Plan Current plan remains appropriate    Co-evaluation              AM-PAC PT "6 Clicks" Mobility   Outcome Measure  Help needed turning from your back to your side while in a flat bed without using bedrails?: None Help needed moving from lying on your back to sitting on the side of a flat bed without using bedrails?: None Help needed moving to and from a bed to a chair (including a  wheelchair)?: None Help needed standing up from a chair using your arms (e.g., wheelchair or bedside chair)?: None Help needed to walk in hospital room?: None Help needed climbing 3-5 steps with a railing? : None 6 Click Score: 24    End of Session Equipment Utilized During Treatment: Gait belt Activity Tolerance: Patient tolerated treatment well Patient left: with call bell/phone within reach;in chair Nurse Communication: Mobility status PT Visit Diagnosis: Unsteadiness on feet (R26.81);Muscle weakness (generalized) (M62.81);Difficulty in walking, not elsewhere classified (R26.2);Pain Pain - Right/Left: Right Pain - part of body: Knee     Time: 0093-8182 PT Time Calculation (min) (ACUTE ONLY): 16 min  Charges:  $Therapeutic Activity: 8-22 mins                     Olga Coaster PT, DPT 11:13 AM,05/25/22

## 2022-05-25 NOTE — Progress Notes (Signed)
  Subjective:  POD #4 s/p repeat I&D right knee septic bursitis.   Patient reports right pain as mild.    Objective:   VITALS:   Vitals:   05/24/22 1514 05/24/22 1932 05/25/22 0346 05/25/22 0849  BP: 109/68 116/69 116/68 114/70  Pulse: 93 92 84 84  Resp: 18 16 16 16   Temp: 98.3 F (36.8 C) 97.9 F (36.6 C) 98 F (36.7 C) 98.2 F (36.8 C)  TempSrc:      SpO2: 97% 97% 94% 97%  Weight:      Height:        PHYSICAL EXAM: Right knee  Neurovascular intact Sensation intact distally Intact pulses distally Dorsiflexion/Plantar flexion intact Incision: dressing C/D/I No cellulitis present Compartment soft  LABS  No results found for this or any previous visit (from the past 24 hour(s)).  No results found.  Assessment/Plan: 4 Days Post-Op   Principal Problem:   Septic arthritis (HCC) Active Problems:   Hypokalemia   Anemia   Cellulitis   Cirrhosis of liver without ascites (HCC)   Polysubstance abuse (HCC)   Alcohol abuse   On anticoagulant therapy   Hypocalcemia   Tongue lesion   Septic bursitis   Continue IV antibiotics as prescribed.  Continue PT.  May be discharged from an orthopaedic standpoint on oral antibiotics once cleared medically.   Follow up in 7-10 days at [facility] after discharge for suture removal and wound check.  Will need medical follow up for other medical issues including cirrhosis.   [facility] , MD 05/25/2022, 1:00 PM

## 2022-05-25 NOTE — Progress Notes (Signed)
PROGRESS NOTE    Marco Spears  VOJ:[id number] DOB: [birth year] DOA: 05/21/2022 PCP: Pcp, No    Brief Narrative:   46 year old male with history of homelessness, polysubstance abuse, liver cirrhosis presumed secondary to alcohol abuse who presents to the emergency department for chief concerns of suture removal for right knee.   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  He reports he has taken Harvoni treatment for in 2017/2018.  He endorses completion of course.  Patient taken the operating room for washout and I&D of knee on 7/27.  Tolerated procedure well.  Case discussed with orthopedics on 7/28.  Surgical site looks good.  No indication for repeat surgery from orthopedic standpoint.  They are okay with discharge.  Follow-up outpatient 10 to 14 days for wound check and suture removal.  Patient has mildly decompensated hepatic cirrhosis.  He has a distended belly with lower extremity edema.  States that he did not receive his home diuretics.  These have been restarted.  Underwent image guided paracentesis on 7/28.  Yielded 3.6 L hazy yellow fluid.  No complications.  Appreciate interventional radiology assistance.   Assessment & Plan:   Principal Problem:   Septic arthritis (HCC) Active Problems:   Cellulitis   Anemia   Hypokalemia   Cirrhosis of liver without ascites (HCC)   Polysubstance abuse (HCC)   Alcohol abuse   On anticoagulant therapy   Hypocalcemia   Tongue lesion   Septic bursitis  Septic bursitis Presumed secondary to antibiotic nonadherence.  Was discharged on 7/11 with Keflex 500 mg every 6 hours until 8/3.  Did not take antibiotics as prescribed.  Received Rocephin and vancomycin in ED. seen by orthopedics on postprocedure day #1.  Presentation inconsistent with septic arthritis.  Okay for discharge from orthopedic standpoint. Plan: Continue Rocephin while inpatient Follow  surgical cultures, no growth to date Anticipate transition to p.o. Keflex at time of discharge Multimodal pain control, only tramadol and gabapentin  Hepatic cirrhosis with mild decompensation History of hepatitis C status post Harvoni Thrombocytopenia Patient reports recent weight gain, bilateral lower extremity edema, abdominal distention.  States that he has not received his diuretics.  Reports that he had a paracentesis well last week with 7 L fluid removed.  Repeat paracentesis on 7/28.  Yielded 3.6 L hazy fluid.  Specimens not sent for labs.  I suspect medication nonadherence is main etiology for decompensation. Plan: Continue IV Lasix 40 mg twice daily today Continue home Aldactone 100 mg daily Hold metolazone   Hypokalemia Likely in setting of cirrhosis.  Monitor and replace as necessary   Tongue lesion - On right lateral, query oral candidiasis versus carcinoma - Lesion does not appear consistent with aphthous ulcer - Nystatin swish and swallow - Check HIV, negative - Recommended patient follow-up with PCP if lesion does not resolve and or reoccurs patient may need referral to oncology and/or ent for outpatient biopsy - TOC place for PCP - Patient endorses understanding and compliance   Hypocalcemia - Corrected calcium is 8.8   On anticoagulant therapy - Patient states he was prescribed Eliquis for lower extremity clots however he has not taken it for many weeks - Ultrasound of the bilateral lower extremity in June 2023 and the right lower extremity was read as negative for DVT - I did not resume Eliquis on admission   Alcohol abuse - CIWA precaution    DVT  prophylaxis: SQ heparin Code Status: Full code Family Communication: None today Disposition Plan: Status is: Inpatient Remains inpatient appropriate because: Septic bursitis on IV antibiotics.  Hepatic cirrhosis with mild decompensation.  On IV diuretics.  Will diuresis in-house for 1 additional day and  transition back to oral diuretics in preparation for discharge 8/1.      Level of care: Med-Surg  Consultants:  Orthopedics  Procedures:  Knee I&D with washout  Antimicrobials: Rocephin   Subjective: Seen and examined.  Resting in bed.  No visible distress.  Lower extremity edema improving.  Objective: Vitals:   05/24/22 1514 05/24/22 1932 05/25/22 0346 05/25/22 0849  BP: 109/68 116/69 116/68 114/70  Pulse: 93 92 84 84  Resp: 18 16 16 16   Temp: 98.3 F (36.8 C) 97.9 F (36.6 C) 98 F (36.7 C) 98.2 F (36.8 C)  TempSrc:      SpO2: 97% 97% 94% 97%  Weight:      Height:        Intake/Output Summary (Last 24 hours) at 05/25/2022 1147 Last data filed at 05/24/2022 1936 Gross per 24 hour  Intake 270 ml  Output --  Net 270 ml   Filed Weights   05/20/22 2022 05/21/22 1315  Weight: 117.8 kg 104.3 kg    Examination:  General exam: NAD Respiratory system: Clear to auscultation. Respiratory effort normal. Cardiovascular system: S1-S2, RRR, no murmurs, 1+ pitting edema BLE Gastrointestinal system: Soft, mild distention, nontender, normal bowel sounds Central nervous system: Alert and oriented. No focal neurological deficits. Extremities: Symmetric 5 x 5 power. Skin: No rashes, lesions or ulcers Psychiatry: Judgement and insight appear normal. Mood & affect appropriate.     Data Reviewed: I have personally reviewed following labs and imaging studies  CBC: Recent Labs  Lab 05/20/22 2043 05/22/22 0622 05/24/22 0834  WBC 3.9* 5.4 3.9*  NEUTROABS 2.8  --  2.4  HGB 10.7* 10.5* 10.2*  HCT 31.9* 30.8* 31.2*  MCV 103.2* 101.7* 104.3*  PLT 84* 55* 57*   Basic Metabolic Panel: Recent Labs  Lab 05/20/22 2043 05/20/22 2355 05/22/22 0622 05/24/22 0834  NA 138  --  136 132*  K 2.9*  --  4.1 3.7  CL 108  --  106 101  CO2 26  --  26 28  GLUCOSE 143*  --  142* 107*  BUN 8  --  10 11  CREATININE 0.74  --  0.70 0.75  CALCIUM 7.6*  --  8.4* 7.9*  MG  --  1.9  1.9 1.9   GFR: Estimated Creatinine Clearance: 147.6 mL/min (by C-G formula based on SCr of 0.75 mg/dL). Liver Function Tests: Recent Labs  Lab 05/20/22 2043  AST 65*  ALT 44  ALKPHOS 134*  BILITOT 3.2*  PROT 6.2*  ALBUMIN 2.5*   Recent Labs  Lab 05/20/22 2043  LIPASE 40   No results for input(s): "AMMONIA" in the last 168 hours. Coagulation Profile: Recent Labs  Lab 05/20/22 2043  INR 1.7*   Cardiac Enzymes: Recent Labs  Lab 05/20/22 2043  CKTOTAL 55   BNP (last 3 results) No results for input(s): "PROBNP" in the last 8760 hours. HbA1C: No results for input(s): "HGBA1C" in the last 72 hours. CBG: No results for input(s): "GLUCAP" in the last 168 hours. Lipid Profile: No results for input(s): "CHOL", "HDL", "LDLCALC", "TRIG", "CHOLHDL", "LDLDIRECT" in the last 72 hours. Thyroid Function Tests: No results for input(s): "TSH", "T4TOTAL", "FREET4", "T3FREE", "THYROIDAB" in the last 72 hours. Anemia Panel: No  results for input(s): "VITAMINB12", "FOLATE", "FERRITIN", "TIBC", "IRON", "RETICCTPCT" in the last 72 hours. Sepsis Labs: Recent Labs  Lab 05/20/22 2053 05/20/22 2355  LATICACIDVEN 1.9 1.5    Recent Results (from the past 240 hour(s))  Blood culture (routine x 2)     Status: None   Collection Time: 05/20/22  8:53 PM   Specimen: BLOOD  Result Value Ref Range Status   Specimen Description BLOOD RIGHT [facility]  Final   Special Requests   Final    BOTTLES DRAWN AEROBIC AND ANAEROBIC Blood Culture results may not be optimal due to an excessive volume of blood received in culture bottles   Culture   Final    NO GROWTH 5 DAYS Performed at [facility], [address] [place], [place] [address]    Report Status 05/25/2022 FINAL  Final  Aerobic/Anaerobic Culture w Gram Stain (surgical/deep wound)     Status: None (Preliminary result)   Collection Time: 05/21/22  2:07 PM   Specimen: PATH Other; Body Fluid  Result Value Ref Range Status   Specimen  Description   Final    WOUND Performed at [facility], [address] [place], [place] [address]    Special Requests   Final    RIGHT LEG Performed at [facility], [address] [place], [place] [address]    Gram Stain   Final    NO SQUAMOUS EPITHELIAL CELLS SEEN FEW WBC SEEN FEW GRAM POSITIVE COCCI Performed at [facility], [address] [place], [place] [address]    Culture   Final    FEW STAPHYLOCOCCUS AUREUS SUSCEPTIBILITIES TO FOLLOW WITHIN MIXED ORGANISMS NO ANAEROBES ISOLATED; CULTURE IN PROGRESS FOR 5 DAYS    Report Status PENDING  Incomplete  Culture, blood (Routine X 2) w Reflex to ID Panel     Status: None (Preliminary result)   Collection Time: 05/21/22  5:18 PM   Specimen: BLOOD  Result Value Ref Range Status   Specimen Description BLOOD RIGHT ANTECUBITAL  Final   Special Requests   Final    BOTTLES DRAWN AEROBIC AND ANAEROBIC Blood Culture adequate volume   Culture   Final    NO GROWTH 4 DAYS Performed at [facility], [address] [place], [place] [address]    Report Status PENDING  Incomplete         Radiology Studies: No results found.      Scheduled Meds:  ferrous sulfate  325 mg Oral Q breakfast   folic acid  1 mg Oral Daily   furosemide  40 mg Intravenous BID   gabapentin  400 mg Oral TID   heparin  5,000 Units Subcutaneous Q8H   multivitamin with minerals  1 tablet Oral Daily   spironolactone  100 mg Oral Daily   thiamine  100 mg Oral Daily   Or   thiamine  100 mg Intravenous Daily   Continuous Infusions:  cefTRIAXone (ROCEPHIN)  IV 2 g (05/25/22 1101)     LOS: 3 days      Tresa Moore, MD [facility]   If 7PM-7AM, please contact night-coverage  05/25/2022, 11:47 AM

## 2022-05-26 ENCOUNTER — Inpatient Hospital Stay: Payer: Self-pay | Admitting: Infectious Diseases

## 2022-05-26 ENCOUNTER — Inpatient Hospital Stay: Payer: Self-pay

## 2022-05-26 ENCOUNTER — Other Ambulatory Visit: Payer: Self-pay

## 2022-05-26 LAB — CULTURE, BLOOD (ROUTINE X 2)
Culture: NO GROWTH
Special Requests: ADEQUATE

## 2022-05-26 LAB — AEROBIC/ANAEROBIC CULTURE W GRAM STAIN (SURGICAL/DEEP WOUND): Gram Stain: NONE SEEN

## 2022-05-26 MED ORDER — FUROSEMIDE 10 MG/ML IJ SOLN: 40.0000 mg | Freq: Every day | INTRAMUSCULAR | Status: DC

## 2022-05-26 MED ORDER — LINEZOLID 600 MG PO TABS
600.0000 mg | ORAL_TABLET | Freq: Two times a day (BID) | ORAL | Status: DC
  Administered 2022-05-26: 600 mg via ORAL
  Filled 2022-05-26: qty 1

## 2022-05-26 MED ORDER — GABAPENTIN 400 MG PO CAPS
400.0000 mg | ORAL_CAPSULE | Freq: Three times a day (TID) | ORAL | 0 refills | Status: DC
Start: 2022-05-26 — End: 2022-07-03
  Filled 2022-05-26: qty 63, 21d supply, fill #0

## 2022-05-26 MED ORDER — LINEZOLID 600 MG PO TABS
600.0000 mg | ORAL_TABLET | Freq: Two times a day (BID) | ORAL | 0 refills | Status: DC
  Filled 2022-05-26: qty 20, 10d supply, fill #0

## 2022-05-26 NOTE — Progress Notes (Addendum)
1219 Pt refuses to get up in the chair this morning for breakfast, says will get up in the chair later.   1340 Pt being transported to [place] at this time   1437 Pt returned to room from [place]  1534 PO ABX given. D/c paperwork reviewed with pt. Meds (gabapentin and Linezolid) sealed and given to pt. IV removed. Extra gauze and tegaderm provided. Immobilizer placed. [employer] taxi called

## 2022-05-26 NOTE — Discharge Summary (Signed)
Physician Discharge Summary  Marco Spears TDV:[id number] DOB: 1976-08-16 DOA: 05/21/2022  PCP: Oneita Hurt, No  Admit date: 05/21/2022 Discharge date: 05/26/2022  Admitted From: Home Disposition: Home  Recommendations for Outpatient Follow-up:  Follow up with PCP in 1-2 weeks Follow-up with orthopedics 7 to 10 days Outpatient referral to physical therapy  Home Health: No Equipment/Devices: Knee immobilizer  Discharge Condition: Stable CODE STATUS: Full Diet recommendation: Fluid restrict  Brief/Interim Summary: 46 year old male with history of homelessness, polysubstance abuse, liver cirrhosis presumed secondary to alcohol abuse who presents to the emergency department for chief concerns of suture removal for right knee.    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  He reports he has taken Harvoni treatment for in 2017/2018.  He endorses completion of course.   Patient taken the operating room for washout and I&D of knee on 7/27.  Tolerated procedure well.  Case discussed with orthopedics on 7/28.  Surgical site looks good.  No indication for repeat surgery from orthopedic standpoint.  They are okay with discharge.  Follow-up outpatient 10 to 14 days for wound check and suture removal.   Patient has mildly decompensated hepatic cirrhosis.  He has a distended belly with lower extremity edema.  States that he did not receive his home diuretics.  These have been restarted.   Underwent image guided paracentesis on 7/28.  Yielded 3.6 L hazy yellow fluid.  No complications.  Appreciate interventional radiology assistance.  Underwent repeat paracentesis on 8/1.  4.8 L fluid removed.  No indication for albumin.  Stable for discharge at this time.  Antibiotics, chronic diuretics, pain medication provided    Discharge Diagnoses:  Principal Problem:   Septic arthritis (HCC) Active Problems:   Cellulitis    Anemia   Hypokalemia   Cirrhosis of liver without ascites (HCC)   Polysubstance abuse (HCC)   Alcohol abuse   On anticoagulant therapy   Hypocalcemia   Tongue lesion   Septic bursitis  Septic bursitis Presumed secondary to antibiotic nonadherence.  Was discharged on 7/11 with Keflex 500 mg every 6 hours until 8/3.  Did not take antibiotics as prescribed.  Received Rocephin and vancomycin in ED. seen by orthopedics on postprocedure day #1.  Presentation inconsistent with septic arthritis.  Okay for discharge from orthopedic standpoint. Plan: Discontinue Rocephin.  Transition to linezolid at time of discharge.  Additional 10 days provided.  Prescription handed to patient at time of discharge.  Gabapentin for pain control.  Follow-up in orthopedic office 7 to 10 days.   Hepatic cirrhosis with mild decompensation History of hepatitis C status post Harvoni Thrombocytopenia Patient reports recent weight gain, bilateral lower extremity edema, abdominal distention.  States that he has not received his diuretics.  Reports that he had a paracentesis well last week with 7 L fluid removed.  Repeat paracentesis on 7/28.  Yielded 3.6 L hazy fluid.  Specimens not sent for labs.  I suspect medication nonadherence is main etiology for decompensation. Plan: Discontinue intravenous Lasix.  Can resume home Lasix and home Aldactone.  These prescriptions have been provided and handed to him at bedside     Tongue lesion Right lateral aspect of tongue.  Question oral candidiasis.  Appears to be improving with nystatin swish and swallow.  Will prescribe on discharge.  HIV is negative.  Recommend patient follow-up with PCP if lesion does not resolve or recurs.  May need follow-up  or referral to oncology and/or ENT for outpatient biopsy.   Discharge Instructions  Discharge Instructions     Ambulatory referral to Infectious Disease   Complete by: As directed    Cellulitis patient:  Received dalbavancin on  05/21/2022.   Ambulatory referral to Physical Therapy   Complete by: As directed    Diet - low sodium heart healthy   Complete by: As directed    Increase activity slowly   Complete by: As directed    No wound care   Complete by: As directed       Allergies as of 05/26/2022       Reactions   Amoxicillin Rash   TOLERATED CEFAZOLIN PRIOR States it makes his skin turn red        Medication List     STOP taking these medications    cephALEXin 500 MG capsule Commonly known as: KEFLEX   Eliquis 2.5 MG Tabs tablet Generic drug: apixaban       TAKE these medications    B-complex with vitamin C tablet Take 1 tablet by mouth daily.   FeroSul 325 (65 FE) MG tablet Generic drug: ferrous sulfate Take 1 tablet (325 mg total) by mouth daily with breakfast.   folic acid 1 MG tablet Commonly known as: FOLVITE Take 1 tablet (1 mg total) by mouth daily.   furosemide 40 MG tablet Commonly known as: LASIX Take 1 tablet (40 mg total) by mouth 2 (two) times daily.   gabapentin 400 MG capsule Commonly known as: NEURONTIN Take 1 capsule (400 mg total) by mouth 3 (three) times daily for 21 days.   linezolid 600 MG tablet Commonly known as: Zyvox Take 1 tablet (600 mg total) by mouth 2 (two) times daily.   multivitamin with minerals Tabs tablet Take 1 tablet by mouth daily.   nystatin 100000 UNIT/ML suspension Commonly known as: MYCOSTATIN Take 5 mLs (500,000 Units total) by mouth 4 (four) times daily.   spironolactone 50 MG tablet Commonly known as: ALDACTONE Take 2 tablets (100 mg total) by mouth daily.        Allergies  Allergen Reactions   Amoxicillin Rash    TOLERATED CEFAZOLIN PRIOR States it makes his skin turn red    Consultations: Orthopedics   Procedures/Studies: US Paracentesis  Result Date: 05/22/2022 INDICATION: Patient with history of polysubstance abuse, alcoholic cirrhosis and HCV infection. Patient referred to IR for therapeutic  paracentesis for ascites. EXAM: ULTRASOUND GUIDED THERAPEUTIC LEFT UPPER QUADRANT PARACENTESIS MEDICATIONS: 10 mL 1 % lidocaine COMPLICATIONS: None immediate. PROCEDURE: Informed written consent was obtained from the patient after a discussion of the risks, benefits and alternatives to treatment. A timeout was performed prior to the initiation of the procedure. Initial ultrasound scanning demonstrates a moderate amount of ascites within the left upper abdominal quadrant. The left upper abdomen was prepped and draped in the usual sterile fashion. 1% lidocaine was used for local anesthesia. Following this, a 6 Fr Safe-T-Centesis catheter was introduced. An ultrasound image was saved for documentation purposes. The paracentesis was performed. The catheter was removed and a dressing was applied. The patient tolerated the procedure well without immediate post procedural complication. Patient received post-procedure intravenous albumin; see nursing notes for details. FINDINGS: A total of approximately 3.6 L of hazy, yellow fluid was removed. IMPRESSION: Successful ultrasound-guided paracentesis yielding 3.6 liters of peritoneal fluid. PLAN: If the patient eventually requires >/=2 paracenteses in a 30 day period, candidacy for formal evaluation by the [facility] Interventional Radiology Portal Hypert[facility]  will be assessed. Read by: Narda Rutherford, AGNP-BC Electronically Signed   By: Albin Felling M.D.   On: 05/22/2022 15:59   MR KNEE RIGHT WO CONTRAST  Result Date: 05/21/2022 CLINICAL DATA:  Septic arthritis suspected, knee, no prior imaging increased pain, redness, swelling, discharge from right knee wound about 2 weeks after surgery for septic bursitis; non-compliant with antibiotics EXAM: MRI OF THE RIGHT KNEE WITHOUT CONTRAST TECHNIQUE: Multiplanar, multisequence MR imaging of the knee was performed. No intravenous contrast was administered. COMPARISON:  MRI  05/06/2022 FINDINGS: Technical Note: Despite  efforts by the technologist and patient, motion artifact is present on today's exam and could not be eliminated. This reduces exam sensitivity and specificity. Bones/Joint/Cartilage No acute fracture. No dislocation. Mild tricompartmental chondral irregularities, without interval progression from prior. Small knee joint effusion, similar in size to prior. Mild patchy marrow edema within the posterior aspect of the medial tibial plateau is unchanged from prior and likely reactive to overlying medial meniscal tear. There is also mild patchy marrow edema within the lateral aspect of the patella. Preservation of the fatty T1 marrow signal. No focal bone erosion. Ligaments and Menisci Oblique undersurface tear of the medial meniscal posterior horn and body. Intact lateral meniscus. Intact cruciate and collateral ligaments. Muscles and Tendons Musculotendinous structures within normal limits. Soft tissues Diffuse circumferential soft tissue edema and ill-defined fluid throughout the knee with overlying skin thickening. Focal fluid collection within the prepatellar soft tissues has increased in size now measuring approximately 4.0 x 1.0 x 3.7 cm. Collection has communication to the skin surface anteriorly, likely related to interval incision and drainage. IMPRESSION: 1. Similar degree of diffuse circumferential soft tissue edema and ill-defined fluid throughout the knee compatible cellulitis. Focal fluid collection within the prepatellar soft tissues has increased in size now measuring approximately 4.0 x 1.0 x 3.7 cm. Collection has communication to the skin surface anteriorly, likely related to interval incision and drainage. 2. Unchanged small knee joint effusion is favored reactive secondary to internal derangement. Septic arthritis is felt to be unlikely. 3. Mild patchy marrow edema within the lateral aspect of the patella with preservation of the T1 marrow signal. This could be reactive or reflect a mild bone  contusion. No evidence to suggest acute osteomyelitis at this time. 4. 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 Electronically Signed   By: Davina Poke D.O.   On: 05/21/2022 08:18   US Paracentesis  Result Date: 05/13/2022 INDICATION: Patient with history of alcoholic cirrhosis and recurrent ascites request received for therapeutic paracentesis. Per patient, he has had paracentesis performed at outside facilities previously. EXAM: ULTRASOUND GUIDED PARACENTESIS MEDICATIONS: Local 1% lidocaine only. COMPLICATIONS: None immediate. PROCEDURE: Informed written consent was obtained from the patient after a discussion of the risks, benefits and alternatives to treatment. A timeout was performed prior to the initiation of the procedure. Initial ultrasound scanning demonstrates a large amount of ascites within the left upper abdominal quadrant. The left upper abdomen was prepped and draped in the usual sterile fashion. 1% lidocaine was used for local anesthesia. Following this, a 19 gauge, 7-cm, Yueh catheter was introduced. An ultrasound image was saved for documentation purposes. The paracentesis was performed. The catheter was removed and a dressing was applied. The patient tolerated the procedure well without immediate post procedural complication. FINDINGS: A total of approximately 6 L of clear yellow fluid was removed. IMPRESSION: Successful ultrasound-guided paracentesis yielding 6 liters of peritoneal fluid. PLAN: If the patient eventually requires >/=2 paracenteses in a 30  day period, candidacy for formal evaluation by the [facility] Interventional Radiology Portal Hypertension Clinic will be assessed. This exam was performed by Tsosie Billing PA-C, and was supervised and interpreted by Dr. Pascal Lux. Electronically Signed   By: Sandi Mariscal M.D.   On: 05/13/2022 14:28   MR KNEE RIGHT WO CONTRAST  Result Date: 05/07/2022 CLINICAL DATA:  Swelling/inflammation. EXAM: MRI OF THE RIGHT  KNEE WITHOUT CONTRAST TECHNIQUE: Multiplanar, multisequence MR imaging of the knee was performed. No intravenous contrast was administered. COMPARISON:  CT scan 05/05/2022 FINDINGS: Diffuse and marked subcutaneous soft tissue swelling/edema and skin thickening consistent with cellulitis. Small focal fluid collection measuring 2 cm in the region of the prepatellar bursa could reflect bursitis. Could not exclude septic bursitis. Very small joint effusion with a small amount of air likely from a joint aspiration. Recommend clinical correlation. No findings suspicious for septic arthritis or osteomyelitis. Mild edema like signal changes in the anterior and posterior compartment musculature suggesting mild/early myositis but no findings for pyomyositis. Mild knee joint degenerative changes with degenerative chondrosis. Posterior horn medial meniscus tears noted. The knee ligaments are intact. IMPRESSION: 1. Diffuse and marked subcutaneous soft tissue swelling/edema and skin thickening consistent with cellulitis. 2. Small focal fluid collection in the region of the prepatellar bursa could reflect bursitis. Could not exclude septic bursitis. 3. No findings suspicious for septic arthritis or osteomyelitis. 4. Very small knee joint effusion and 1 small air collection possibly recent joint aspiration. 5. Mild/early myositis but no findings for pyomyositis. 6. Incidental medial meniscus tear Electronically Signed   By: Marijo Sanes M.D.   On: 05/07/2022 07:18   CT FEMUR RIGHT W CONTRAST  Result Date: 05/05/2022 CLINICAL DATA:  Redness/swelling to right leg EXAM: CT OF THE LOWER RIGHT EXTREMITY WITH CONTRAST CT OF THE RIGHT TIBIA AND FIBULA WITH CONTRAST CT OF THE RIGHT FOOT WITH CONTRAST TECHNIQUE: Multidetector CT imaging of the lower right extremity was performed according to the standard protocol following intravenous contrast administration. RADIATION DOSE REDUCTION: This exam was performed according to the  departmental dose-optimization program which includes automated exposure control, adjustment of the mA and/or kV according to patient size and/or use of iterative reconstruction technique. CONTRAST:  169mL OMNIPAQUE IOHEXOL 300 MG/ML  SOLN COMPARISON:  Right lower extremity venous Doppler dated 05/05/2022 FINDINGS: CT of the right lower extremity performed, extending from the level of the lower pelvis through the right foot. Visualized pelvis is notable for moderate pelvic ascites and right inguinal nodes measuring up to 19 mm short axis (series 8/image 99), likely reactive. No evidence of DVT. Visualized osseous structures are within normal limits. No fracture is seen. No cortical destruction to suggest osteomyelitis. No drainable fluid collection/abscess. Mild subcutaneous edema along the anterior upper thigh. Additional subcutaneous edema along the calf, more prominent laterally. Mild subcutaneous edema with skin thickening along the dorsal foot. These findings correspond to the clinical appearance of cellulitis. No intramuscular abnormality. IMPRESSION: Cellulitis along the right lower extremity, as above. No drainable fluid collection/abscess. No evidence of osteomyelitis. Prominent right inguinal nodes, likely reactive. Moderate pelvic ascites, incompletely visualized. Electronically Signed   By: Julian Hy M.D.   On: 05/05/2022 21:15   CT TIBIA FIBULA RIGHT W CONTRAST  Result Date: 05/05/2022 CLINICAL DATA:  Redness/swelling to right leg EXAM: CT OF THE LOWER RIGHT EXTREMITY WITH CONTRAST CT OF THE RIGHT TIBIA AND FIBULA WITH CONTRAST CT OF THE RIGHT FOOT WITH CONTRAST TECHNIQUE: Multidetector CT imaging of the lower right extremity was performed according  to the standard protocol following intravenous contrast administration. RADIATION DOSE REDUCTION: This exam was performed according to the departmental dose-optimization program which includes automated exposure control, adjustment of the mA  and/or kV according to patient size and/or use of iterative reconstruction technique. CONTRAST:  164mL OMNIPAQUE IOHEXOL 300 MG/ML  SOLN COMPARISON:  Right lower extremity venous Doppler dated 05/05/2022 FINDINGS: CT of the right lower extremity performed, extending from the level of the lower pelvis through the right foot. Visualized pelvis is notable for moderate pelvic ascites and right inguinal nodes measuring up to 19 mm short axis (series 8/image 99), likely reactive. No evidence of DVT. Visualized osseous structures are within normal limits. No fracture is seen. No cortical destruction to suggest osteomyelitis. No drainable fluid collection/abscess. Mild subcutaneous edema along the anterior upper thigh. Additional subcutaneous edema along the calf, more prominent laterally. Mild subcutaneous edema with skin thickening along the dorsal foot. These findings correspond to the clinical appearance of cellulitis. No intramuscular abnormality. IMPRESSION: Cellulitis along the right lower extremity, as above. No drainable fluid collection/abscess. No evidence of osteomyelitis. Prominent right inguinal nodes, likely reactive. Moderate pelvic ascites, incompletely visualized. Electronically Signed   By: Julian Hy M.D.   On: 05/05/2022 21:15   CT FOOT RIGHT W CONTRAST  Result Date: 05/05/2022 CLINICAL DATA:  Redness/swelling to right leg EXAM: CT OF THE LOWER RIGHT EXTREMITY WITH CONTRAST CT OF THE RIGHT TIBIA AND FIBULA WITH CONTRAST CT OF THE RIGHT FOOT WITH CONTRAST TECHNIQUE: Multidetector CT imaging of the lower right extremity was performed according to the standard protocol following intravenous contrast administration. RADIATION DOSE REDUCTION: This exam was performed according to the departmental dose-optimization program which includes automated exposure control, adjustment of the mA and/or kV according to patient size and/or use of iterative reconstruction technique. CONTRAST:  112mL OMNIPAQUE  IOHEXOL 300 MG/ML  SOLN COMPARISON:  Right lower extremity venous Doppler dated 05/05/2022 FINDINGS: CT of the right lower extremity performed, extending from the level of the lower pelvis through the right foot. Visualized pelvis is notable for moderate pelvic ascites and right inguinal nodes measuring up to 19 mm short axis (series 8/image 99), likely reactive. No evidence of DVT. Visualized osseous structures are within normal limits. No fracture is seen. No cortical destruction to suggest osteomyelitis. No drainable fluid collection/abscess. Mild subcutaneous edema along the anterior upper thigh. Additional subcutaneous edema along the calf, more prominent laterally. Mild subcutaneous edema with skin thickening along the dorsal foot. These findings correspond to the clinical appearance of cellulitis. No intramuscular abnormality. IMPRESSION: Cellulitis along the right lower extremity, as above. No drainable fluid collection/abscess. No evidence of osteomyelitis. Prominent right inguinal nodes, likely reactive. Moderate pelvic ascites, incompletely visualized. Electronically Signed   By: Julian Hy M.D.   On: 05/05/2022 21:15   US Venous Img Lower Unilateral Right  Result Date: 05/05/2022 CLINICAL DATA:  RLE pain, swelling, erythema EXAM: RIGHT LOWER EXTREMITY VENOUS DOPPLER ULTRASOUND TECHNIQUE: Gray-scale sonography with compression, as well as color and duplex ultrasound, were performed to evaluate the deep venous system(s) from the level of the common femoral vein through the popliteal and proximal calf veins. COMPARISON:  None Available. FINDINGS: VENOUS Normal compressibility of the common femoral, superficial femoral, and popliteal veins, as well as the visualized calf veins. Visualized portions of profunda femoral vein and great saphenous vein unremarkable. No filling defects to suggest DVT on grayscale or color Doppler imaging. Doppler waveforms show normal direction of venous flow, normal  respiratory plasticity and response to  augmentation. Limited views of the contralateral common femoral vein are unremarkable. Other: Incidental lymph node identified in the right groin, which measures 4 x 1.4 cm. This node has a fatty hilum. IMPRESSION: No evidence of DVT in the right lower extremity. Electronically Signed   By: Margaretha Sheffield M.D.   On: 05/05/2022 18:12      Subjective: Seen and examined on the day of discharge.  Complains of some abdominal distention, improved after paracentesis.  Stable for discharge home.  Discharge Exam: Vitals:   05/26/22 1355 05/26/22 1430  BP: 110/64 (!) 99/58  Pulse: 96 95  Resp:    Temp:    SpO2: 95% 96%   Vitals:   05/26/22 0809 05/26/22 1132 05/26/22 1355 05/26/22 1430  BP: 108/61 112/74 110/64 (!) 99/58  Pulse: 92 91 96 95  Resp: 14 16    Temp: 98.2 F (36.8 C) 98.1 F (36.7 C)    TempSrc:      SpO2: 96% 98% 95% 96%  Weight:      Height:        General: Pt is alert, awake, not in acute distress Cardiovascular: RRR, S1/S2 +, no rubs, no gallops Respiratory: CTA bilaterally, no wheezing, no rhonchi Abdominal: Soft, nontender, mild distention Extremities: Trace edema bilateral lower extremities    The results of significant diagnostics from this hospitalization (including imaging, microbiology, ancillary and laboratory) are listed below for reference.     Microbiology: Recent Results (from the past 240 hour(s))  Blood culture (routine x 2)     Status: None   Collection Time: 05/20/22  8:53 PM   Specimen: BLOOD  Result Value Ref Range Status   Specimen Description BLOOD RIGHT [facility]  Final   Special Requests   Final    BOTTLES DRAWN AEROBIC AND ANAEROBIC Blood Culture results may not be optimal due to an excessive volume of blood received in culture bottles   Culture   Final    NO GROWTH 5 DAYS Performed at [facility], [address] [place], [place] [address]    Report Status 05/25/2022 FINAL  Final   Aerobic/Anaerobic Culture w Gram Stain (surgical/deep wound)     Status: None   Collection Time: 05/21/22  2:07 PM   Specimen: PATH Other; Body Fluid  Result Value Ref Range Status   Specimen Description   Final    WOUND Performed at [facility], [address], [place], [place] [address]    Special Requests   Final    RIGHT LEG Performed at [facility], [address] [place], [place] [address]    Gram Stain   Final    NO SQUAMOUS EPITHELIAL CELLS SEEN FEW WBC SEEN FEW GRAM POSITIVE COCCI    Culture   Final    FEW METHICILLIN RESISTANT STAPHYLOCOCCUS AUREUS WITHIN MIXED ORGANISMS NO ANAEROBES ISOLATED Performed at [facility], [place] [address] [place],  [address]    Report Status 05/26/2022 FINAL  Final   Organism ID, Bacteria METHICILLIN RESISTANT STAPHYLOCOCCUS AUREUS  Final      Susceptibility   Methicillin resistant staphylococcus aureus - MIC*    CIPROFLOXACIN >=8 RESISTANT Resistant     ERYTHROMYCIN >=8 RESISTANT Resistant     GENTAMICIN <=0.5 SENSITIVE Sensitive     OXACILLIN >=4 RESISTANT Resistant     TETRACYCLINE <=1 SENSITIVE Sensitive     VANCOMYCIN <=0.5 SENSITIVE Sensitive     TRIMETH/SULFA 160 RESISTANT Resistant     CLINDAMYCIN <=0.25 SENSITIVE Sensitive  RIFAMPIN <=0.5 SENSITIVE Sensitive     Inducible Clindamycin NEGATIVE Sensitive     * FEW METHICILLIN RESISTANT STAPHYLOCOCCUS AUREUS  Culture, blood (Routine X 2) w Reflex to ID Panel     Status: None   Collection Time: 05/21/22  5:18 PM   Specimen: BLOOD  Result Value Ref Range Status   Specimen Description BLOOD RIGHT ANTECUBITAL  Final   Special Requests   Final    BOTTLES DRAWN AEROBIC AND ANAEROBIC Blood Culture adequate volume   Culture   Final    NO GROWTH 5 DAYS Performed at [facility], [address] [place], [place] [address]    Report Status 05/26/2022 FINAL  Final     Labs: BNP (last 3 results) Recent Labs    04/19/22 2327   BNP [id number]   Basic Metabolic Panel: Recent Labs  Lab 05/20/22 2043 05/20/22 2355 05/22/22 0622 05/24/22 0834  NA 138  --  136 132*  K 2.9*  --  4.1 3.7  CL 108  --  106 101  CO2 26  --  26 28  GLUCOSE 143*  --  142* 107*  BUN 8  --  10 11  CREATININE 0.74  --  0.70 0.75  CALCIUM 7.6*  --  8.4* 7.9*  MG  --  1.9 1.9 1.9   Liver Function Tests: Recent Labs  Lab 05/20/22 2043  AST 65*  ALT 44  ALKPHOS 134*  BILITOT 3.2*  PROT 6.2*  ALBUMIN 2.5*   Recent Labs  Lab 05/20/22 2043  LIPASE 40   No results for input(s): "AMMONIA" in the last 168 hours. CBC: Recent Labs  Lab 05/20/22 2043 05/22/22 0622 05/24/22 0834  WBC 3.9* 5.4 3.9*  NEUTROABS 2.8  --  2.4  HGB 10.7* 10.5* 10.2*  HCT 31.9* 30.8* 31.2*  MCV 103.2* 101.7* 104.3*  PLT 84* 55* 57*   Cardiac Enzymes: Recent Labs  Lab 05/20/22 2043  CKTOTAL 55   BNP: Invalid input(s): "POCBNP" CBG: No results for input(s): "GLUCAP" in the last 168 hours. D-Dimer No results for input(s): "DDIMER" in the last 72 hours. Hgb A1c No results for input(s): "HGBA1C" in the last 72 hours. Lipid Profile No results for input(s): "CHOL", "HDL", "LDLCALC", "TRIG", "CHOLHDL", "LDLDIRECT" in the last 72 hours. Thyroid function studies No results for input(s): "TSH", "T4TOTAL", "T3FREE", "THYROIDAB" in the last 72 hours.  Invalid input(s): "FREET3" Anemia work up No results for input(s): "VITAMINB12", "FOLATE", "FERRITIN", "TIBC", "IRON", "RETICCTPCT" in the last 72 hours. Urinalysis    Component Value Date/Time   COLORURINE AMBER (A) 05/20/2022 2043   APPEARANCEUR CLEAR (A) 05/20/2022 2043   LABSPEC 1.034 (H) 05/20/2022 2043   PHURINE 5.0 05/20/2022 2043   GLUCOSEU NEGATIVE 05/20/2022 2043   HGBUR NEGATIVE 05/20/2022 2043   [place] NEGATIVE 05/20/2022 2043   [place] NEGATIVE 05/20/2022 2043   PROTEINUR 30 (A) 05/20/2022 2043   NITRITE NEGATIVE 05/20/2022 2043   LEUKOCYTESUR NEGATIVE 05/20/2022 2043    Sepsis Labs Recent Labs  Lab 05/20/22 2043 05/22/22 0622 05/24/22 0834  WBC 3.9* 5.4 3.9*   Microbiology Recent Results (from the past 240 hour(s))  Blood culture (routine x 2)     Status: None   Collection Time: 05/20/22  8:53 PM   Specimen: BLOOD  Result Value Ref Range Status   Specimen Description BLOOD RIGHT [facility]  Final   Special Requests   Final    BOTTLES DRAWN AEROBIC AND ANAEROBIC Blood Culture results may not be optimal due  to an excessive volume of blood received in culture bottles   Culture   Final    NO GROWTH 5 DAYS Performed at [facility], [address] [place] [place], [place] [address]    Report Status 05/25/2022 FINAL  Final  Aerobic/Anaerobic Culture w Gram Stain (surgical/deep wound)     Status: None   Collection Time: 05/21/22  2:07 PM   Specimen: PATH Other; Body Fluid  Result Value Ref Range Status   Specimen Description   Final    WOUND Performed at [facility], [address] [place], [place] [address]    Special Requests   Final    RIGHT LEG Performed at [facility], [address] [place], [place] [address]    Gram Stain   Final    NO SQUAMOUS EPITHELIAL CELLS SEEN FEW WBC SEEN FEW GRAM POSITIVE COCCI    Culture   Final    FEW METHICILLIN RESISTANT STAPHYLOCOCCUS AUREUS WITHIN MIXED ORGANISMS NO ANAEROBES ISOLATED Performed at [facility], [address] [place], [place] [address]    Report Status 05/26/2022 FINAL  Final   Organism ID, Bacteria METHICILLIN RESISTANT STAPHYLOCOCCUS AUREUS  Final      Susceptibility   Methicillin resistant staphylococcus aureus - MIC*    CIPROFLOXACIN >=8 RESISTANT Resistant     ERYTHROMYCIN >=8 RESISTANT Resistant     GENTAMICIN <=0.5 SENSITIVE Sensitive     OXACILLIN >=4 RESISTANT Resistant     TETRACYCLINE <=1 SENSITIVE Sensitive     VANCOMYCIN <=0.5 SENSITIVE Sensitive     TRIMETH/SULFA 160 RESISTANT Resistant     CLINDAMYCIN <=0.25 SENSITIVE Sensitive      RIFAMPIN <=0.5 SENSITIVE Sensitive     Inducible Clindamycin NEGATIVE Sensitive     * FEW METHICILLIN RESISTANT STAPHYLOCOCCUS AUREUS  Culture, blood (Routine X 2) w Reflex to ID Panel     Status: None   Collection Time: 05/21/22  5:18 PM   Specimen: BLOOD  Result Value Ref Range Status   Specimen Description BLOOD RIGHT ANTECUBITAL  Final   Special Requests   Final    BOTTLES DRAWN AEROBIC AND ANAEROBIC Blood Culture adequate volume   Culture   Final    NO GROWTH 5 DAYS Performed at [facility], [address] [place], [place] [address]    Report Status 05/26/2022 FINAL  Final     Time coordinating discharge: Over 30 minutes  SIGNED:   Tresa Moore, MD  [facility] 05/26/2022, 3:11 PM Pager   If 7PM-7AM, please contact night-coverage

## 2022-05-26 NOTE — Plan of Care (Signed)
  Problem: Education: Goal: Knowledge of General Education information will improve Description: Including pain rating scale, medication(s)/side effects and non-pharmacologic comfort measures Outcome: Progressing   Problem: Health Behavior/Discharge Planning: Goal: Ability to manage health-related needs will improve Outcome: Progressing   Problem: Health Behavior/Discharge Planning: Goal: Ability to manage health-related needs will improve Outcome: Progressing   Problem: Activity: Goal: Risk for activity intolerance will decrease Outcome: Progressing   Problem: Nutrition: Goal: Adequate nutrition will be maintained Outcome: Progressing   Problem: Coping: Goal: Level of anxiety will decrease Outcome: Progressing

## 2022-05-26 NOTE — Progress Notes (Signed)
  Subjective:  POD #5 s/p repeat I&D right knee septic bursitis.   Patient reports right pain as mild.  Patient resting comfortably.  Complains of ascites.  States his dressing was changed already by nursing staff and had only a small amount of drainage.  Objective:   VITALS:   Vitals:   05/25/22 1707 05/25/22 2052 05/26/22 0429 05/26/22 0809  BP: 123/80 120/70 109/67 108/61  Pulse: 99 91 90 92  Resp: 16 16 15 14   Temp: 98 F (36.7 C) 98.3 F (36.8 C) 98.4 F (36.9 C) 98.2 F (36.8 C)  TempSrc:  Oral Oral   SpO2: 100% 97% 93% 96%  Weight:      Height:        PHYSICAL EXAM: Right knee: Neurovascular intact Sensation intact distally Intact pulses distally Dorsiflexion/Plantar flexion intact dressing C/D/I No cellulitis present Compartment soft  LABS  No results found for this or any previous visit (from the past 24 hour(s)).  No results found.  Assessment/Plan: 5 Days Post-Op   Principal Problem:   Septic arthritis (HCC) Active Problems:   Hypokalemia   Anemia   Cellulitis   Cirrhosis of liver without ascites (HCC)   Polysubstance abuse (HCC)   Alcohol abuse   On anticoagulant therapy   Hypocalcemia   Tongue lesion   Septic bursitis  Continue IV antibiotics as prescribed.  Continue PT.  May be discharged from an orthopaedic standpoint on oral antibiotics once cleared medically.   Follow up in 7-10 days at [facility] after discharge for suture removal and wound check.  Will need medical follow up for other medical issues including cirrhosis/ascites.    [facility] , MD 05/26/2022, 11:19 AM

## 2022-05-26 NOTE — Progress Notes (Signed)
PT Cancellation Note  Patient Details Name: Marco Spears MRN: [id number] DOB: 06-06-1976   Cancelled Treatment:    Reason Eval/Treat Not Completed: Other (comment). Pt sleeping soundly, PT to re-attempt as able.    Olga Coaster PT, DPT 11:08 AM,05/26/22

## 2022-06-01 ENCOUNTER — Emergency Department: Payer: Self-pay

## 2022-06-01 ENCOUNTER — Other Ambulatory Visit: Payer: Self-pay

## 2022-06-01 ENCOUNTER — Inpatient Hospital Stay
Admission: EM | Admit: 2022-06-01 | Discharge: 2022-06-15 | DRG: 871 | Disposition: A | Discharge status: Home / Self Care | Payer: Self-pay | Attending: Internal Medicine | Admitting: Internal Medicine

## 2022-06-01 DIAGNOSIS — T43655A Adverse effect of methamphetamines, initial encounter: Secondary | ICD-10-CM | POA: Diagnosis present

## 2022-06-01 DIAGNOSIS — L03115 Cellulitis of right lower limb: Secondary | ICD-10-CM

## 2022-06-01 DIAGNOSIS — F17210 Nicotine dependence, cigarettes, uncomplicated: Secondary | ICD-10-CM | POA: Diagnosis present

## 2022-06-01 DIAGNOSIS — D61818 Other pancytopenia: Secondary | ICD-10-CM | POA: Diagnosis not present

## 2022-06-01 DIAGNOSIS — M7981 Nontraumatic hematoma of soft tissue: Secondary | ICD-10-CM | POA: Diagnosis present

## 2022-06-01 DIAGNOSIS — Z5900 Homelessness unspecified: Secondary | ICD-10-CM

## 2022-06-01 DIAGNOSIS — R188 Other ascites: Secondary | ICD-10-CM

## 2022-06-01 DIAGNOSIS — Z880 Allergy status to penicillin: Secondary | ICD-10-CM

## 2022-06-01 DIAGNOSIS — R7881 Bacteremia: Secondary | ICD-10-CM

## 2022-06-01 DIAGNOSIS — Z79899 Other long term (current) drug therapy: Secondary | ICD-10-CM

## 2022-06-01 DIAGNOSIS — R6520 Severe sepsis without septic shock: Secondary | ICD-10-CM | POA: Diagnosis present

## 2022-06-01 DIAGNOSIS — K7031 Alcoholic cirrhosis of liver with ascites: Secondary | ICD-10-CM | POA: Diagnosis present

## 2022-06-01 DIAGNOSIS — A4151 Sepsis due to Escherichia coli [E. coli]: Principal | ICD-10-CM | POA: Diagnosis present

## 2022-06-01 DIAGNOSIS — R4182 Altered mental status, unspecified: Secondary | ICD-10-CM | POA: Diagnosis present

## 2022-06-01 DIAGNOSIS — Z7901 Long term (current) use of anticoagulants: Secondary | ICD-10-CM

## 2022-06-01 DIAGNOSIS — Z6830 Body mass index (BMI) 30.0-30.9, adult: Secondary | ICD-10-CM

## 2022-06-01 DIAGNOSIS — E871 Hypo-osmolality and hyponatremia: Secondary | ICD-10-CM | POA: Diagnosis present

## 2022-06-01 DIAGNOSIS — E440 Moderate protein-calorie malnutrition: Secondary | ICD-10-CM | POA: Diagnosis present

## 2022-06-01 DIAGNOSIS — G9341 Metabolic encephalopathy: Secondary | ICD-10-CM | POA: Diagnosis present

## 2022-06-01 DIAGNOSIS — D649 Anemia, unspecified: Secondary | ICD-10-CM | POA: Diagnosis present

## 2022-06-01 DIAGNOSIS — D696 Thrombocytopenia, unspecified: Secondary | ICD-10-CM | POA: Diagnosis present

## 2022-06-01 DIAGNOSIS — Y929 Unspecified place or not applicable: Secondary | ICD-10-CM

## 2022-06-01 DIAGNOSIS — K766 Portal hypertension: Secondary | ICD-10-CM | POA: Diagnosis present

## 2022-06-01 DIAGNOSIS — K652 Spontaneous bacterial peritonitis: Secondary | ICD-10-CM | POA: Diagnosis present

## 2022-06-01 DIAGNOSIS — Z86718 Personal history of other venous thrombosis and embolism: Secondary | ICD-10-CM

## 2022-06-01 DIAGNOSIS — Y900 Blood alcohol level of less than 20 mg/100 ml: Secondary | ICD-10-CM | POA: Diagnosis present

## 2022-06-01 DIAGNOSIS — G928 Other toxic encephalopathy: Secondary | ICD-10-CM | POA: Diagnosis present

## 2022-06-01 DIAGNOSIS — M009 Pyogenic arthritis, unspecified: Secondary | ICD-10-CM | POA: Diagnosis present

## 2022-06-01 DIAGNOSIS — F1010 Alcohol abuse, uncomplicated: Secondary | ICD-10-CM | POA: Diagnosis present

## 2022-06-01 DIAGNOSIS — R400 Somnolence: Secondary | ICD-10-CM

## 2022-06-01 DIAGNOSIS — D689 Coagulation defect, unspecified: Secondary | ICD-10-CM | POA: Diagnosis present

## 2022-06-01 DIAGNOSIS — A419 Sepsis, unspecified organism: Principal | ICD-10-CM

## 2022-06-01 DIAGNOSIS — Z91199 Patient's noncompliance with other medical treatment and regimen due to unspecified reason: Secondary | ICD-10-CM

## 2022-06-01 DIAGNOSIS — M62838 Other muscle spasm: Secondary | ICD-10-CM | POA: Diagnosis not present

## 2022-06-01 DIAGNOSIS — B1920 Unspecified viral hepatitis C without hepatic coma: Secondary | ICD-10-CM | POA: Diagnosis present

## 2022-06-01 DIAGNOSIS — F1910 Other psychoactive substance abuse, uncomplicated: Secondary | ICD-10-CM | POA: Diagnosis present

## 2022-06-01 DIAGNOSIS — R1084 Generalized abdominal pain: Secondary | ICD-10-CM

## 2022-06-01 DIAGNOSIS — E876 Hypokalemia: Secondary | ICD-10-CM | POA: Diagnosis present

## 2022-06-01 DIAGNOSIS — Z20822 Contact with and (suspected) exposure to covid-19: Secondary | ICD-10-CM | POA: Diagnosis present

## 2022-06-01 HISTORY — DX: Other psychoactive substance abuse, uncomplicated: F19.10

## 2022-06-01 HISTORY — DX: Other infective bursitis, unspecified site: M71.10

## 2022-06-01 LAB — URINALYSIS, COMPLETE (UACMP) WITH MICROSCOPIC
Bacteria, UA: NONE SEEN
Bilirubin Urine: NEGATIVE
Glucose, UA: NEGATIVE mg/dL
Hgb urine dipstick: NEGATIVE
Ketones, ur: NEGATIVE mg/dL
Leukocytes,Ua: NEGATIVE
Nitrite: NEGATIVE
Protein, ur: NEGATIVE mg/dL
Specific Gravity, Urine: 1.026 (ref 1.005–1.030)
pH: 6 (ref 5.0–8.0)

## 2022-06-01 LAB — COMPREHENSIVE METABOLIC PANEL
ALT: 38 U/L (ref 0–44)
AST: 50 U/L — ABNORMAL HIGH (ref 15–41)
Albumin: 2.4 g/dL — ABNORMAL LOW (ref 3.5–5.0)
Alkaline Phosphatase: 115 U/L (ref 38–126)
Anion gap: 7 (ref 5–15)
BUN: 22 mg/dL — ABNORMAL HIGH (ref 6–20)
CO2: 24 mmol/L (ref 22–32)
Calcium: 8.2 mg/dL — ABNORMAL LOW (ref 8.9–10.3)
Chloride: 100 mmol/L (ref 98–111)
Creatinine, Ser: 0.95 mg/dL (ref 0.61–1.24)
GFR, Estimated: >60 mL/min (ref >60)
Glucose, Bld: 123 mg/dL — ABNORMAL HIGH (ref 70–99)
Potassium: 3.4 mmol/L — ABNORMAL LOW (ref 3.5–5.1)
Sodium: 131 mmol/L — ABNORMAL LOW (ref 135–145)
Total Bilirubin: 5.1 mg/dL — ABNORMAL HIGH (ref 0.3–1.2)
Total Protein: 6 g/dL — ABNORMAL LOW (ref 6.5–8.1)

## 2022-06-01 LAB — CBC WITH DIFFERENTIAL/PLATELET
Abs Immature Granulocytes: 0.02 10*3/uL (ref 0.00–0.07)
Basophils Absolute: 0 10*3/uL (ref 0.0–0.1)
Basophils Relative: 1 %
Eosinophils Absolute: 0.1 10*3/uL (ref 0.0–0.5)
Eosinophils Relative: 1 %
HCT: 31.6 % — ABNORMAL LOW (ref 39.0–52.0)
Hemoglobin: 10.7 g/dL — ABNORMAL LOW (ref 13.0–17.0)
Immature Granulocytes: 0 %
Lymphocytes Relative: 5 %
Lymphs Abs: 0.3 10*3/uL — ABNORMAL LOW (ref 0.7–4.0)
MCH: 33.4 pg (ref 26.0–34.0)
MCHC: 33.9 g/dL (ref 30.0–36.0)
MCV: 98.8 fL (ref 80.0–100.0)
Monocytes Absolute: 0.2 10*3/uL (ref 0.1–1.0)
Monocytes Relative: 4 %
Neutro Abs: 5.5 10*3/uL (ref 1.7–7.7)
Neutrophils Relative %: 89 %
Platelets: 55 10*3/uL — ABNORMAL LOW (ref 150–400)
RBC: 3.2 MIL/uL — ABNORMAL LOW (ref 4.22–5.81)
RDW: 15.9 % — ABNORMAL HIGH (ref 11.5–15.5)
WBC: 6.2 10*3/uL (ref 4.0–10.5)
nRBC: 0 % (ref 0.0–0.2)

## 2022-06-01 LAB — RESP PANEL BY RT-PCR (FLU A&B, COVID) ARPGX2
Influenza A by PCR: NEGATIVE
Influenza B by PCR: NEGATIVE
SARS Coronavirus 2 by RT PCR: NEGATIVE

## 2022-06-01 LAB — ETHANOL: Alcohol, Ethyl (B): <10 mg/dL (ref <10)

## 2022-06-01 LAB — LACTIC ACID, PLASMA
Lactic Acid, Venous: 2.7 mmol/L (ref 0.5–1.9)
Lactic Acid, Venous: 2 mmol/L (ref 0.5–1.9)

## 2022-06-01 LAB — URINE DRUG SCREEN, QUALITATIVE (ARMC ONLY)
Amphetamines, Ur Screen: POSITIVE — AB
Barbiturates, Ur Screen: NOT DETECTED
Benzodiazepine, Ur Scrn: NOT DETECTED
Cannabinoid 50 Ng, Ur ~~LOC~~: POSITIVE — AB
Cocaine Metabolite,Ur ~~LOC~~: NOT DETECTED
MDMA (Ecstasy)Ur Screen: NOT DETECTED
Methadone Scn, Ur: NOT DETECTED
Opiate, Ur Screen: NOT DETECTED
Phencyclidine (PCP) Ur S: NOT DETECTED
Tricyclic, Ur Screen: NOT DETECTED

## 2022-06-01 LAB — PROCALCITONIN: Procalcitonin: <0.1 ng/mL

## 2022-06-01 LAB — APTT: aPTT: 35 seconds (ref 24–36)

## 2022-06-01 LAB — T4, FREE: Free T4: 1.12 ng/dL (ref 0.61–1.12)

## 2022-06-01 LAB — AMMONIA: Ammonia: 33 umol/L (ref 9–35)

## 2022-06-01 LAB — MRSA NEXT GEN BY PCR, NASAL: MRSA by PCR Next Gen: NOT DETECTED

## 2022-06-01 LAB — PROTIME-INR
INR: 1.5 — ABNORMAL HIGH (ref 0.8–1.2)
Prothrombin Time: 17.8 seconds — ABNORMAL HIGH (ref 11.4–15.2)

## 2022-06-01 LAB — TSH: TSH: 1.859 u[IU]/mL (ref 0.350–4.500)

## 2022-06-01 MED ORDER — LACTATED RINGERS IV BOLUS (SEPSIS)
1000.0000 mL | Freq: Once | INTRAVENOUS | Status: AC
  Administered 2022-06-01: 1000 mL via INTRAVENOUS

## 2022-06-01 MED ORDER — THIAMINE HCL 100 MG/ML IJ SOLN
100.0000 mg | Freq: Every day | INTRAMUSCULAR | Status: DC
  Administered 2022-06-02 – 2022-06-14 (×13): 100 mg via INTRAVENOUS
  Filled 2022-06-01 (×13): qty 2

## 2022-06-01 MED ORDER — CHLORHEXIDINE GLUCONATE CLOTH 2 % EX PADS
6.0000 | MEDICATED_PAD | Freq: Every day | CUTANEOUS | Status: DC
  Administered 2022-06-01 – 2022-06-02 (×2): 6 via TOPICAL

## 2022-06-01 MED ORDER — VANCOMYCIN HCL IN DEXTROSE 1-5 GM/200ML-% IV SOLN
1000.0000 mg | Freq: Once | INTRAVENOUS | Status: AC
  Administered 2022-06-01: 1000 mg via INTRAVENOUS
  Filled 2022-06-01: qty 200

## 2022-06-01 MED ORDER — NICOTINE 21 MG/24HR TD PT24
21.0000 mg | MEDICATED_PATCH | Freq: Every day | TRANSDERMAL | Status: DC
  Administered 2022-06-03: 21 mg via TRANSDERMAL
  Filled 2022-06-01 (×10): qty 1

## 2022-06-01 MED ORDER — SODIUM CHLORIDE 0.9 % IV SOLN
2.0000 g | Freq: Once | INTRAVENOUS | Status: AC
  Administered 2022-06-01: 2 g via INTRAVENOUS
  Filled 2022-06-01: qty 20

## 2022-06-01 MED ORDER — POTASSIUM CHLORIDE 10 MEQ/100ML IV SOLN: 10.0000 meq | INTRAVENOUS | Status: AC

## 2022-06-01 MED ORDER — MORPHINE SULFATE (PF) 2 MG/ML IV SOLN
2.0000 mg | INTRAVENOUS | Status: DC | PRN
  Administered 2022-06-02 – 2022-06-03 (×2): 2 mg via INTRAVENOUS
  Filled 2022-06-01 (×2): qty 1

## 2022-06-01 MED ORDER — POTASSIUM CHLORIDE 10 MEQ/100ML IV SOLN
10.0000 meq | INTRAVENOUS | Status: AC
  Administered 2022-06-01 – 2022-06-02 (×2): 10 meq via INTRAVENOUS
  Filled 2022-06-01 (×2): qty 100

## 2022-06-01 MED ORDER — LACTATED RINGERS IV SOLN: INTRAVENOUS | Status: AC

## 2022-06-01 MED ORDER — DEXTROSE-NACL 5-0.45 % IV SOLN: INTRAVENOUS | Status: DC

## 2022-06-01 MED ORDER — ACETAMINOPHEN 650 MG RE SUPP: 650.0000 mg | Freq: Four times a day (QID) | RECTAL | Status: DC | PRN

## 2022-06-01 MED ORDER — ORAL CARE MOUTH RINSE: 15.0000 mL | OROMUCOSAL | Status: DC | PRN

## 2022-06-01 MED ORDER — ACETAMINOPHEN 325 MG PO TABS
650.0000 mg | ORAL_TABLET | Freq: Four times a day (QID) | ORAL | Status: DC | PRN
  Administered 2022-06-02 – 2022-06-04 (×5): 650 mg via ORAL
  Filled 2022-06-01 (×6): qty 2

## 2022-06-01 MED ORDER — SODIUM CHLORIDE 0.9 % IV SOLN: INTRAVENOUS | Status: AC

## 2022-06-01 MED ORDER — SODIUM CHLORIDE 0.9% FLUSH
3.0000 mL | Freq: Two times a day (BID) | INTRAVENOUS | Status: DC
  Administered 2022-06-01 – 2022-06-14 (×27): 3 mL via INTRAVENOUS

## 2022-06-01 MED ORDER — LACTATED RINGERS IV BOLUS (SEPSIS): 500.0000 mL | Freq: Once | INTRAVENOUS | Status: DC

## 2022-06-01 NOTE — ED Notes (Signed)
MD Bradler placed 18g IV in LAC with UC guided. BC were collected at this time.

## 2022-06-01 NOTE — ED Notes (Signed)
Provider at bedside attempting to place second IV.

## 2022-06-01 NOTE — Assessment & Plan Note (Addendum)
Altered mental status. Patient was found altered and doing drugs. Differentials include trauma, bleed, acute metabolic encephalopathy. Head ct negative for any acute findings.

## 2022-06-01 NOTE — Assessment & Plan Note (Signed)
Replace and follow. ?

## 2022-06-01 NOTE — Assessment & Plan Note (Signed)
Pt Meets septic criteria from suspected knee source. Pt got vancomycin and rocephin.  Knee today is swollen and red.  Orthopedic consulted per edmd , Dr. Okey Dupre.

## 2022-06-01 NOTE — H&P (Addendum)
History and Physical    Marco Spears [id number] DOB: 1976-07-16 DOA: 06/01/2022  PCP: Pcp, No    Patient coming from:  Unknown    Chief Complaint:  AMS   HPI:  Marco Spears is a 46 y.o. male seen in ed brought by EMS for altered mental status where patient was picked up from a home where he had been on meth.  Patient's right knee incision had clear drainage per report patient was intermittently arousable but restless agitated and almost paranoid.  Patient reports pain over the right knee.  Patient gets startled with sound. Patient is altered and somnolent and not oriented and responds to when called but falls asleep immediately.  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  Patient had received Harvoni for his hep C and has had a DVT for which she has taken Eliquis.  Patient has past medical history of allergies to amoxicillin. Patient had diarrhea right knee recurrent septic prepatellar bursitis and has had I&D on 05/07/2022.  Patient had paracentesis on 05/22/2022 with 3.6 L of hazy yellow fluid.  Last paracentesis was on 05/26/2022 where he had 4.8 L removed. Patient is a large hematoma about 12 cm on his lower abdominal area more towards suprapubic area.   ED Course:   Vitals:   06/01/22 1545 06/01/22 1600 06/01/22 1615 06/01/22 1630  BP: 122/65 112/66 115/68 120/64  Pulse: (!) 116 (!) 118 (!) 115 (!) 113  Resp: (!) 27 (!) 23 (!) 28 (!) 27  Temp:    98.4 F (36.9 C)  TempSrc:    Oral  SpO2:    98%  Weight:      Patient meets sepsis criteria with heart rate respiratory rate white count, O2 sats of 98% on room air. CMP shows hyponatremia with a sodium of 131, potassium 3.4, glucose 123, BUN of 22, creatinine of 0.95, calcium of 8.2, AST of 50, total bili of 5.1. Lactic acid today is 2.7 with a repeat of 2.0. CBC shows normal white count of 6.2 hemoglobin of 10.7 RDW of 15.9 and  platelet counts of 55,000. Urine drug screen positive for amphetamines and THC. Last abdominal ultrasound was in June 2023 showing mild ascites, cirrhosis, cholelithiasis.  Review of Systems:  Review of Systems  Unable to perform ROS: Patient unresponsive  Neurological:  Positive for sensory change.   Past Medical History:  Diagnosis Date   Cirrhosis of liver ([place])    Heart murmur     Past Surgical History:  Procedure Laterality Date   INCISION AND DRAINAGE Right 05/07/2022   Procedure: INCISION AND DRAINAGE;  Surgeon: Thornton Park, MD;  Location: [facility] ORS;  Service: Orthopedics;  Laterality: Right;   IRRIGATION AND DEBRIDEMENT KNEE Right 05/21/2022   Procedure: IRRIGATION AND DEBRIDEMENT KNEE;  Surgeon: Thornton Park, MD;  Location: [facility] ORS;  Service: Orthopedics;  Laterality: Right;     reports that he has been smoking cigarettes. He has never used smokeless tobacco. He reports current drug use. Drugs: Marijuana, Methamphetamines, and IV. He reports that he does not drink alcohol.  Allergies  Allergen Reactions   Amoxicillin Rash    TOLERATED CEFAZOLIN PRIOR States it makes his skin turn red    History reviewed. No pertinent family history.  Prior to Admission medications   Medication Sig Start Date End Date Taking? Authorizing Provider  B Complex-C (B-COMPLEX WITH VITAMIN C) tablet Take 1 tablet by mouth  daily.    [provider]  ferrous sulfate 325 (65 FE) MG tablet Take 1 tablet (325 mg total) by mouth daily with breakfast. 05/22/22   Tresa Moore, MD  folic acid (FOLVITE) 1 MG tablet Take 1 tablet (1 mg total) by mouth daily. 05/22/22   Tresa Moore, MD  furosemide (LASIX) 40 MG tablet Take 1 tablet (40 mg total) by mouth 2 (two) times daily. 05/22/22   Tresa Moore, MD  gabapentin (NEURONTIN) 400 MG capsule Take 1 capsule (400 mg total) by mouth 3 (three) times daily for 21 days. 05/26/22 06/16/22  Tresa Moore, MD  linezolid  (ZYVOX) 600 MG tablet Take 1 tablet (600 mg total) by mouth 2 (two) times daily. 05/26/22   Tresa Moore, MD  Multiple Vitamin (MULTIVITAMIN WITH MINERALS) TABS tablet Take 1 tablet by mouth daily.    [provider]  nystatin (MYCOSTATIN) 100000 UNIT/ML suspension Take 5 mLs (500,000 Units total) by mouth 4 (four) times daily. 05/22/22   Tresa Moore, MD  spironolactone (ALDACTONE) 50 MG tablet Take 2 tablets (100 mg total) by mouth daily. 05/22/22   Tresa Moore, MD    Physical Exam: Vitals:   06/01/22 1545 06/01/22 1600 06/01/22 1615 06/01/22 1630  BP: 122/65 112/66 115/68 120/64  Pulse: (!) 116 (!) 118 (!) 115 (!) 113  Resp: (!) 27 (!) 23 (!) 28 (!) 27  Temp:    98.4 F (36.9 C)  TempSrc:    Oral  SpO2:    98%  Weight:       Physical Exam Vitals and nursing note reviewed.  Constitutional:      General: He is sleeping. He is not in acute distress.    Appearance: He is not ill-appearing, toxic-appearing or diaphoretic.     Interventions: Nasal cannula in place.  HENT:     Head: Normocephalic and atraumatic.     Right Ear: External ear normal.     Left Ear: External ear normal.     Nose: Nose normal. No nasal deformity.     Mouth/Throat:     Lips: Pink.     Mouth: Mucous membranes are moist.     Tongue: No lesions.     Pharynx: Oropharynx is clear.  Eyes:     General: Lids are normal.  Cardiovascular:     Rate and Rhythm: Normal rate and regular rhythm.     Pulses: Normal pulses.          Dorsalis pedis pulses are 2+ on the right side and 2+ on the left side.       Posterior tibial pulses are 2+ on the right side and 2+ on the left side.     Heart sounds: Normal heart sounds.  Pulmonary:     Effort: Pulmonary effort is normal.     Breath sounds: Normal breath sounds.  Abdominal:     General: Bowel sounds are normal. There is distension.     Palpations: Abdomen is soft. There is no mass.     Tenderness: There is no abdominal tenderness. There  is no guarding.     Hernia: No hernia is present.       Comments: Large bruise.  Soft area otherwise no mass or collection felt under. Indwelling foley orange urine from elevated bilirubin,.   Musculoskeletal:        General: Swelling present.     Right knee: Swelling and erythema present.     Right lower  leg: 2+ Edema present.     Left lower leg: 2+ Edema present.       Legs:  Skin:    General: Skin is dry.  Neurological:     Mental Status: He is easily aroused. He is lethargic.     Cranial Nerves: Cranial nerves 2-12 are intact.     Motor: Motor function is intact.  Psychiatric:        Attention and Perception: Attention normal.        Speech: Speech normal.        Cognition and Memory: Cognition normal.      Labs on Admission: I have personally reviewed following labs and imaging studies BMET Recent Labs  Lab 06/01/22 1153  NA 131*  K 3.4*  CL 100  CO2 24  BUN 22*  CREATININE 0.95  GLUCOSE 123*   Electrolytes Recent Labs  Lab 06/01/22 1153  CALCIUM 8.2*   Sepsis Markers Recent Labs  Lab 06/01/22 1153 06/01/22 1405  LATICACIDVEN 2.7* 2.0*   ABG No results for input(s): "PHART", "PCO2ART", "PO2ART" in the last 168 hours. Liver Enzymes Recent Labs  Lab 06/01/22 1153  AST 50*  ALT 38  ALKPHOS 115  BILITOT 5.1*  ALBUMIN 2.4*   Cardiac Enzymes No results for input(s): "TROPONINI", "PROBNP" in the last 168 hours. No results found for: "DDIMER" Coag's Recent Labs  Lab 06/01/22 1153  APTT 35  INR 1.5*    Recent Results (from the past 240 hour(s))  Resp Panel by RT-PCR (Flu A&B, Covid) Anterior Nasal Swab     Status: None   Collection Time: 06/01/22 12:34 PM   Specimen: Anterior Nasal Swab  Result Value Ref Range Status   SARS Coronavirus 2 by RT PCR NEGATIVE NEGATIVE Final    Comment: (NOTE) SARS-CoV-2 target nucleic acids are NOT DETECTED.  The SARS-CoV-2 RNA is generally detectable in upper respiratory specimens during the acute phase  of infection. The lowest concentration of SARS-CoV-2 viral copies this assay can detect is 138 copies/mL. A negative result does not preclude SARS-Cov-2 infection and should not be used as the sole basis for treatment or other patient management decisions. A negative result may occur with  improper specimen collection/handling, submission of specimen other than nasopharyngeal swab, presence of viral mutation(s) within the areas targeted by this assay, and inadequate number of viral copies(<138 copies/mL). A negative result must be combined with clinical observations, patient history, and epidemiological information. The expected result is Negative.  Fact Sheet for Patients:  EntrepreneurPulse.com.au  Fact Sheet for Healthcare Providers:  IncredibleEmployment.be  This test is no t yet approved or cleared by the [place] FDA and  has been authorized for detection and/or diagnosis of SARS-CoV-2 by FDA under an Emergency Use Authorization (EUA). This EUA will remain  in effect (meaning this test can be used) for the duration of the COVID-19 declaration under Section 564(b)(1) of the Act, 21 U.S.C.section 360bbb-3(b)(1), unless the authorization is terminated  or revoked sooner.       Influenza A by PCR NEGATIVE NEGATIVE Final   Influenza B by PCR NEGATIVE NEGATIVE Final    Comment: (NOTE) The Xpert Xpress SARS-CoV-2/FLU/RSV plus assay is intended as an aid in the diagnosis of influenza from Nasopharyngeal swab specimens and should not be used as a sole basis for treatment. Nasal washings and aspirates are unacceptable for Xpert Xpress SARS-CoV-2/FLU/RSV testing.  Fact Sheet for Patients: EntrepreneurPulse.com.au  Fact Sheet for Healthcare Providers: IncredibleEmployment.be  This test is not yet approved  or cleared by the [place] and has been authorized for detection and/or diagnosis of  SARS-CoV-2 by FDA under an Emergency Use Authorization (EUA). This EUA will remain in effect (meaning this test can be used) for the duration of the COVID-19 declaration under Section 564(b)(1) of the Act, 21 U.S.C. section 360bbb-3(b)(1), unless the authorization is terminated or revoked.  Performed at [facility], [address] [place], [place] [address]      Current Facility-Administered Medications:    0.9 %  sodium chloride infusion, , Intravenous, Continuous, Para Skeans, MD, Held at 06/01/22 1641   acetaminophen (TYLENOL) tablet 650 mg, 650 mg, Oral, Q6H PRN **OR** acetaminophen (TYLENOL) suppository 650 mg, 650 mg, Rectal, Q6H PRN, Para Skeans, MD   [COMPLETED] lactated ringers bolus 1,000 mL, 1,000 mL, Intravenous, Once, Stopped at 06/01/22 1315 **AND** [COMPLETED] lactated ringers bolus 1,000 mL, 1,000 mL, Intravenous, Once, Stopped at 06/01/22 1330 **AND** [COMPLETED] lactated ringers bolus 1,000 mL, 1,000 mL, Intravenous, Once, Stopped at 06/01/22 1504 **AND** lactated ringers bolus 500 mL, 500 mL, Intravenous, Once, Naaman Plummer, MD, Held at 06/01/22 1316   lactated ringers infusion, , Intravenous, Continuous, Bradler, Vista Lawman, MD, Held at 06/01/22 1233   morphine (PF) 2 MG/ML injection 2 mg, 2 mg, Intravenous, Q4H PRN, Para Skeans, MD   nicotine (NICODERM CQ - dosed in mg/24 hours) patch 21 mg, 21 mg, Transdermal, Daily, Devinn Voshell V, MD   sodium chloride flush (NS) 0.9 % injection 3 mL, 3 mL, Intravenous, Q12H, Florina Ou V, MD  Current Outpatient Medications:    B Complex-C (B-COMPLEX WITH VITAMIN C) tablet, Take 1 tablet by mouth daily., Disp: , Rfl:    ferrous sulfate 325 (65 FE) MG tablet, Take 1 tablet (325 mg total) by mouth daily with breakfast., Disp: 30 tablet, Rfl: 3   folic acid (FOLVITE) 1 MG tablet, Take 1 tablet (1 mg total) by mouth daily., Disp: 30 tablet, Rfl: 0   furosemide (LASIX) 40 MG tablet, Take 1 tablet (40 mg total) by mouth 2  (two) times daily., Disp: 60 tablet, Rfl: 2   gabapentin (NEURONTIN) 400 MG capsule, Take 1 capsule (400 mg total) by mouth 3 (three) times daily for 21 days., Disp: 63 capsule, Rfl: 0   linezolid (ZYVOX) 600 MG tablet, Take 1 tablet (600 mg total) by mouth 2 (two) times daily., Disp: 20 tablet, Rfl: 0   Multiple Vitamin (MULTIVITAMIN WITH MINERALS) TABS tablet, Take 1 tablet by mouth daily., Disp: , Rfl:    nystatin (MYCOSTATIN) 100000 UNIT/ML suspension, Take 5 mLs (500,000 Units total) by mouth 4 (four) times daily., Disp: 60 mL, Rfl: 0   spironolactone (ALDACTONE) 50 MG tablet, Take 2 tablets (100 mg total) by mouth daily., Disp: 60 tablet, Rfl: 3  COVID-19 Labs No results for input(s): "DDIMER", "FERRITIN", "LDH", "CRP" in the last 72 hours. Lab Results  Component Value Date   [place] NEGATIVE 06/01/2022    Radiological Exams on Admission: MR KNEE RIGHT WO CONTRAST  Result Date: 06/01/2022 CLINICAL DATA:  Osteomyelitis suspected, knee, xray done EXAM: MRI OF THE RIGHT KNEE WITHOUT CONTRAST TECHNIQUE: Multiplanar, multisequence MR imaging of the knee was performed. No intravenous contrast was administered. COMPARISON:  Right knee MRI 05/21/2022 FINDINGS: MENISCI Medial: Undersurface tear of the posterior horn and body of the medial meniscus, with mild protrusion of meniscus tissue into the medial inferior gutter, similar to prior. Lateral: Intact lateral meniscus. LIGAMENTS Cruciates: ACL and PCL are intact. Collaterals:  Medial collateral ligament is intact. Lateral collateral ligament complex is intact. CARTILAGE Patellofemoral:  Mild chondrosis.  No focal chondral defect. Medial:  Mild chondrosis.  No focal chondral defect Lateral:  Mild chondrosis.  No focal chondral defect. JOINT: Small joint effusion. POPLITEAL FOSSA: MinisculeBaker's cyst. EXTENSOR MECHANISM: Intact quadriceps tendon. Intact patellar tendon. BONES: No evidence of acute fracture. Decreased marrow edema within the  patella. Preserved T1 marrow signal throughout. Other: Diffuse, circumferential skin thickening and soft tissue swelling along the knee. There is a subcutaneous fluid collection in the prepatellar soft tissues, measuring 4.3 x 1.0 x 3.4 cm (volume = 7.7 cm^3), previously measuring 4.0 x 1.0 x 3.7 cm (volume = 7.7 cm^3), not significantly changed. There appears to be a communication with the skin surface anteriorly (axial PD image 21). IMPRESSION: Diffuse skin thickening and soft tissue swelling along the knee, similar prior, compatible with cellulitis. No significant change in size of the prepatellar soft tissue fluid collection, measuring 4.3 x 1.0 x 3.4 cm, with communication with the skin surface anteriorly, correlate with drainage. Unchanged small joint effusion, likely reactive. Decreased marrow edema in the patella with preserved T1 marrow signal, suggesting resolving contusion or reactive marrow change. No evidence of osteomyelitis or convincing findings to suggest septic arthritis. Unchanged mild tricompartment osteoarthritis and undersurface tear of the medial meniscus. Electronically Signed   By: Caprice Renshaw M.D.   On: 06/01/2022 15:17   CT Head Wo Contrast  Result Date: 06/01/2022 CLINICAL DATA:  Mental status change, unknown cause EXAM: CT HEAD WITHOUT CONTRAST TECHNIQUE: Contiguous axial images were obtained from the base of the skull through the vertex without intravenous contrast. RADIATION DOSE REDUCTION: This exam was performed according to the departmental dose-optimization program which includes automated exposure control, adjustment of the mA and/or kV according to patient size and/or use of iterative reconstruction technique. COMPARISON:  04/20/2022 FINDINGS: Brain: There is no acute intracranial hemorrhage, mass effect, or edema. No acute appearing loss of gray-white differentiation. Left anteroinferior frontal encephalomalacia and anterior left temporal encephalomalacia again noted. There  is no extra-axial fluid collection. Ventricles and sulci are within normal limits in size and configuration. Vascular: No hyperdense vessel or unexpected calcification. Skull: Calvarium is unremarkable. Sinuses/Orbits: No acute finding. Other: None. IMPRESSION: No acute intracranial abnormality. Stable chronic findings detailed above. Electronically Signed   By: Guadlupe Spanish M.D.   On: 06/01/2022 13:57   DG Chest Port 1 View  Result Date: 06/01/2022 CLINICAL DATA:  Questionable sepsis. EXAM: PORTABLE CHEST 1 VIEW COMPARISON:  None Available. FINDINGS: Trachea is midline. Heart size is accentuated by AP technique and low lung volumes. Mild central pulmonary vascular congestion. No airspace consolidation or pleural fluid. IMPRESSION: Central pulmonary vascular congestion may be due in part to low lung volumes. Difficult to exclude impending edema. Electronically Signed   By: Leanna Battles M.D.   On: 06/01/2022 13:12    EKG: Independently reviewed.  EKG shows sinus tachycardia at 104 PR interval of 168, QTc prolonged at 541.   Assessment and Plan: * AMS (altered mental status) Altered mental status. Patient was found altered and doing drugs. Differentials include trauma, bleed, acute metabolic encephalopathy. Head ct negative for any acute findings.    Alcoholic cirrhosis of liver with ascites (HCC) CIWA protocol. Stepdown admit with Telemetry. Aspiration/Fall/ Seizures/Withdrawal precaution. NPO.  Hyperbilirubinemia on LFT and urine is orange.    Latest Ref Rng & Units 06/01/2022   11:53 AM 05/24/2022    8:34 AM 05/22/2022    6:22  AM  CMP  Glucose 70 - 99 mg/dL 976  734  193   BUN 6 - 20 mg/dL 22  11  10    Creatinine 0.61 - 1.24 mg/dL  7.90  2.40   Sodium 135 - 145 mmol/L 131  132  136   Potassium 3.5 - 5.1 mmol/L 3.4  3.7  4.1   Chloride 98 - 111 mmol/L 100  101  106   CO2 22 - 32 mmol/L 24  28  26    Calcium 8.9 - 10.3 mg/dL 8.2  7.9  8.4   Total Protein 6.5 - 8.1 g/dL 6.0      Total Bilirubin 0.3 - 1.2 mg/dL 5.1     Alkaline Phos 38 - 126 U/L 115     AST 15 - 41 U/L 50     ALT 0 - 44 U/L 38      USG of abdomen is pending. Suspect hep c related cirrhosis. We will get hepatitis panel.    Septic arthritis (HCC) Pt Meets septic criteria from suspected knee source. Pt got vancomycin and rocephin.  Knee today is swollen and red.  Orthopedic consulted per edmd , Dr. 9.73.    Anemia    Latest Ref Rng & Units 06/01/2022   11:53 AM 05/24/2022    8:34 AM 05/22/2022    6:22 AM  CBC  WBC 4.0 - 10.5 K/uL 6.2  3.9  5.4   Hemoglobin 13.0 - 17.0 g/dL 05/26/2022  05/24/2022  53.2   Hematocrit 39.0 - 52.0 % 31.6  31.2  30.8   Platelets 150 - 400 K/uL 55  57  55   stable will follow Type and screen.    Hypokalemia Replace and follow.   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  We will follow. NO medication  antiplatelet therapy for dvt.     Polysubstance abuse (HCC) Supportive care. NPO. withdrawal precaution. seizures precaution.     DVT prophylaxis:  Compression stockings.   Code Status:  Full code    Family Communication:  None    Disposition Plan:  None    Consults called:  Orthopaedics.    Admission status: Inpatient.     99.2 MD [facility]  6 PM- 2 AM. Please contact me via secure Chat 6 PM-2 AM. [phone] ( Pager ) To contact the [facility] Attending or Consulting provider 7A - 7P or covering provider during after hours 7P -7A, for this patient.   Check the care team in [facility] and look for a) attending/consulting [facility] provider listed and b) the [facility] team listed Log into [email or web address] and use [place]'s universal password to access. If you do not have the password, please contact the hospital operator. Locate the [facility] provider you are looking for under [facility] and page to a number that you can be directly reached. If you still have difficulty reaching the provider, please page the [facility]  (Director on Call) for the Hospitalists listed on amion for assistance. [email or web address] 06/01/2022, 5:00 PM   1:23 AM: Vitals reviewed pt still tachycardic and RR high we will repeat electrolytes and h/h and treat as needed.

## 2022-06-01 NOTE — Assessment & Plan Note (Addendum)
CIWA protocol. Stepdown admit with Telemetry. Aspiration/Fall/ Seizures/Withdrawal precaution. NPO.  Hyperbilirubinemia on LFT and urine is orange.    Latest Ref Rng & Units 06/01/2022   11:53 AM 05/24/2022    8:34 AM 05/22/2022    6:22 AM  CMP  Glucose 70 - 99 mg/dL 993  716  967   BUN 6 - 20 mg/dL 22  11  10    Creatinine 0.61 - 1.24 mg/dL  8.93  8.10   Sodium 135 - 145 mmol/L 131  132  136   Potassium 3.5 - 5.1 mmol/L 3.4  3.7  4.1   Chloride 98 - 111 mmol/L 100  101  106   CO2 22 - 32 mmol/L 24  28  26    Calcium 8.9 - 10.3 mg/dL 8.2  7.9  8.4   Total Protein 6.5 - 8.1 g/dL 6.0     Total Bilirubin 0.3 - 1.2 mg/dL 5.1     Alkaline Phos 38 - 126 U/L 115     AST 15 - 41 U/L 50     ALT 0 - 44 U/L 38      USG of abdomen is pending. Suspect hep c related cirrhosis. We will get hepatitis panel.

## 2022-06-01 NOTE — Consult Note (Signed)
ORTHOPAEDIC CONSULTATION  REQUESTING PHYSICIAN: Gertha Calkin, MD  Chief Complaint: Right knee erythema and swelling  HPI: Marco Spears is a 46 y.o. male who presented to the ER with altered mental status and was noted to have swelling, erythema and mild serous drainage from his right knee incision.  The patient is status post right knee septic prepatellar bursitis I&D on 7/13 and 7/27 with Dr. Martha Clan.  He was discharged on 8/1 on oral antibiotics.  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  Past Medical History:  Diagnosis Date   Cirrhosis of liver (HCC)    Heart murmur    Past Surgical History:  Procedure Laterality Date   INCISION AND DRAINAGE Right 05/07/2022   Procedure: INCISION AND DRAINAGE;  Surgeon: Juanell Fairly, MD;  Location: [facility] ORS;  Service: Orthopedics;  Laterality: Right;   IRRIGATION AND DEBRIDEMENT KNEE Right 05/21/2022   Procedure: IRRIGATION AND DEBRIDEMENT KNEE;  Surgeon: Juanell Fairly, MD;  Location: [facility] ORS;  Service: Orthopedics;  Laterality: Right;   Social History   Socioeconomic History   Marital status: Single    Spouse name: Not on file   Number of children: Not on file   Years of education: Not on file   Highest education level: Not on file  Occupational History   Not on file  Tobacco Use   Smoking status: Some Days    Types: Cigarettes   Smokeless tobacco: Never  Substance and Sexual Activity   Alcohol use: Never   Drug use: Yes    Types: Marijuana, Methamphetamines, IV   Sexual activity: Not on file  Other Topics Concern   Not on file  Social History Narrative   Not on file   Social Determinants of Health   Financial Resource Strain: Not on file  Food Insecurity: Not on file  Transportation Needs: Not on file  Physical Activity: Not on file  Stress: Not on file  Social Connections: Not on file   History reviewed. No pertinent family  history. Allergies  Allergen Reactions   Amoxicillin Rash    TOLERATED CEFAZOLIN PRIOR States it makes his skin turn red   Prior to Admission medications   Medication Sig Start Date End Date Taking? Authorizing Provider  B Complex-C (B-COMPLEX WITH VITAMIN C) tablet Take 1 tablet by mouth daily.    [provider]  ferrous sulfate 325 (65 FE) MG tablet Take 1 tablet (325 mg total) by mouth daily with breakfast. 05/22/22   Tresa Moore, MD  folic acid (FOLVITE) 1 MG tablet Take 1 tablet (1 mg total) by mouth daily. 05/22/22   Tresa Moore, MD  furosemide (LASIX) 40 MG tablet Take 1 tablet (40 mg total) by mouth 2 (two) times daily. 05/22/22   Tresa Moore, MD  gabapentin (NEURONTIN) 400 MG capsule Take 1 capsule (400 mg total) by mouth 3 (three) times daily for 21 days. 05/26/22 06/16/22  Tresa Moore, MD  linezolid (ZYVOX) 600 MG tablet Take 1 tablet (600 mg total) by mouth 2 (two) times daily. 05/26/22   Tresa Moore, MD  Multiple Vitamin (MULTIVITAMIN WITH MINERALS) TABS tablet Take 1 tablet by mouth daily.    [provider]  nystatin (MYCOSTATIN) 100000 UNIT/ML suspension Take 5 mLs (500,000 Units total) by mouth 4 (four) times daily. 05/22/22   Tresa Moore, MD  spironolactone (ALDACTONE) 50 MG tablet Take 2 tablets (100 mg total) by mouth daily. 05/22/22  Tresa Moore, MD   MR KNEE RIGHT WO CONTRAST  Result Date: 06/01/2022 CLINICAL DATA:  Osteomyelitis suspected, knee, xray done EXAM: MRI OF THE RIGHT KNEE WITHOUT CONTRAST TECHNIQUE: Multiplanar, multisequence MR imaging of the knee was performed. No intravenous contrast was administered. COMPARISON:  Right knee MRI 05/21/2022 FINDINGS: MENISCI Medial: Undersurface tear of the posterior horn and body of the medial meniscus, with mild protrusion of meniscus tissue into the medial inferior gutter, similar to prior. Lateral: Intact lateral meniscus. LIGAMENTS Cruciates: ACL and PCL are  intact. Collaterals: Medial collateral ligament is intact. Lateral collateral ligament complex is intact. CARTILAGE Patellofemoral:  Mild chondrosis.  No focal chondral defect. Medial:  Mild chondrosis.  No focal chondral defect Lateral:  Mild chondrosis.  No focal chondral defect. JOINT: Small joint effusion. POPLITEAL FOSSA: MinisculeBaker's cyst. EXTENSOR MECHANISM: Intact quadriceps tendon. Intact patellar tendon. BONES: No evidence of acute fracture. Decreased marrow edema within the patella. Preserved T1 marrow signal throughout. Other: Diffuse, circumferential skin thickening and soft tissue swelling along the knee. There is a subcutaneous fluid collection in the prepatellar soft tissues, measuring 4.3 x 1.0 x 3.4 cm (volume = 7.7 cm^3), previously measuring 4.0 x 1.0 x 3.7 cm (volume = 7.7 cm^3), not significantly changed. There appears to be a communication with the skin surface anteriorly (axial PD image 21). IMPRESSION: Diffuse skin thickening and soft tissue swelling along the knee, similar prior, compatible with cellulitis. No significant change in size of the prepatellar soft tissue fluid collection, measuring 4.3 x 1.0 x 3.4 cm, with communication with the skin surface anteriorly, correlate with drainage. Unchanged small joint effusion, likely reactive. Decreased marrow edema in the patella with preserved T1 marrow signal, suggesting resolving contusion or reactive marrow change. No evidence of osteomyelitis or convincing findings to suggest septic arthritis. Unchanged mild tricompartment osteoarthritis and undersurface tear of the medial meniscus. Electronically Signed   By: Caprice Renshaw M.D.   On: 06/01/2022 15:17   CT Head Wo Contrast  Result Date: 06/01/2022 CLINICAL DATA:  Mental status change, unknown cause EXAM: CT HEAD WITHOUT CONTRAST TECHNIQUE: Contiguous axial images were obtained from the base of the skull through the vertex without intravenous contrast. RADIATION DOSE REDUCTION: This  exam was performed according to the departmental dose-optimization program which includes automated exposure control, adjustment of the mA and/or kV according to patient size and/or use of iterative reconstruction technique. COMPARISON:  04/20/2022 FINDINGS: Brain: There is no acute intracranial hemorrhage, mass effect, or edema. No acute appearing loss of gray-white differentiation. Left anteroinferior frontal encephalomalacia and anterior left temporal encephalomalacia again noted. There is no extra-axial fluid collection. Ventricles and sulci are within normal limits in size and configuration. Vascular: No hyperdense vessel or unexpected calcification. Skull: Calvarium is unremarkable. Sinuses/Orbits: No acute finding. Other: None. IMPRESSION: No acute intracranial abnormality. Stable chronic findings detailed above. Electronically Signed   By: Guadlupe Spanish M.D.   On: 06/01/2022 13:57   DG Chest Port 1 View  Result Date: 06/01/2022 CLINICAL DATA:  Questionable sepsis. EXAM: PORTABLE CHEST 1 VIEW COMPARISON:  None Available. FINDINGS: Trachea is midline. Heart size is accentuated by AP technique and low lung volumes. Mild central pulmonary vascular congestion. No airspace consolidation or pleural fluid. IMPRESSION: Central pulmonary vascular congestion may be due in part to low lung volumes. Difficult to exclude impending edema. Electronically Signed   By: Leanna Battles M.D.   On: 06/01/2022 13:12    Positive ROS: All other systems have been reviewed and were otherwise  negative with the exception of those mentioned in the HPI and as above.  Physical Exam: General: Altered Cardiovascular: No pedal edema Respiratory: No cyanosis, no use of accessory musculature GI: No organomegaly, abdomen is soft and non-tender Skin: No lesions in the area of chief complaint Neurologic: Sensation intact distally Psychiatric: Patient is competent for consent with normal mood and affect Lymphatic: No axillary or  cervical lymphadenopathy  MUSCULOSKELETAL:   Right lower extremity: Anterior incision with nylon sutures intact, there is surrounding erythema and swelling, mild serous drainage noted.  Tolerates gentle passive motion of the knee joint. Compartments soft. Good cap refill. Motor and sensory intact distally.  Assessment: 46 year old male with multiple comorbidities including homelessness and polysubstance abuse, liver cirrhosis admitted with altered mental status, found to have erythema and drainage from his right knee incision status post prior prepatellar bursitis I&D x 2 (7/13, 7/27) with Dr. Martha Clan  Plan: -Agree with broad-spectrum antibiotic treatment -I discussed this case with Dr. Martha Clan.  Recommendations are for IR/ultrasound guided aspiration of the prepatellar bursa fluid collection, application of an incisional wound vac by the wound care team, and application of a knee immobilizer. -Orthopedics will continue to follow   Ross Marcus, MD    06/01/2022 9:41 PM

## 2022-06-01 NOTE — Assessment & Plan Note (Signed)
Supportive care. NPO. withdrawal precaution. seizures precaution.

## 2022-06-01 NOTE — ED Triage Notes (Signed)
Pt arrives via EMS from a unknown address where he has been doing meth from for the last couple of days. Per EMS pt has liver disease and is a pale yellow in skin color with a recent surgery on his right knee that is weeping fluid. Pt currently have 8 sutures in place. Pt is currently A/Ox4

## 2022-06-01 NOTE — Consult Note (Signed)
CODE SEPSIS - PHARMACY COMMUNICATION  **Broad Spectrum Antibiotics should be administered within 1 hour of Sepsis diagnosis**  Time Code Sepsis Called/Page Received: 1216  Antibiotics Ordered: rocephin, vancomycin  Time of 1st antibiotic administration: 1233  Additional action taken by pharmacy: none  If necessary, Name of Provider/Nurse Contacted: n/a    Bettey Costa ,PharmD Clinical Pharmacist  06/01/2022  1:18 PM

## 2022-06-01 NOTE — ED Notes (Signed)
RN asked to give patient water and make sure he can swallow it. Patient swallowed water with no problems

## 2022-06-01 NOTE — Assessment & Plan Note (Signed)
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  We will follow. NO medication  antiplatelet therapy for dvt.

## 2022-06-01 NOTE — Consult Note (Signed)
PHARMACY -  BRIEF ANTIBIOTIC NOTE   Pharmacy has received consult(s) for Vancomycin from an ED provider.  The patient's profile has been reviewed for ht/wt/allergies/indication/available labs.    One time order(s) placed for Vancomycin 1g IV x 1 dose.  Further antibiotics/pharmacy consults should be ordered by admitting physician if indicated.                       Thank you, Bettey Costa 06/01/2022  12:21 PM

## 2022-06-01 NOTE — ED Notes (Signed)
Pt complaining of sever peri umbilical pain. RN assessed and peri area is hard to palp. RN notified provider, provider said bladder scan pt. RN bladder scanned pt and the machine said greater that . RN notified provider. Per Provider place foley.

## 2022-06-01 NOTE — ED Provider Notes (Signed)
[facility] Provider Note   Event Date/Time   First MD Initiated Contact with Patient 06/01/22 1135     (approximate) History  Altered Mental Status  HPI Marco Spears is a 46 y.o. male with unknown past medical history presents for altered mental status via EMS after they allegedly picked him up from a house where he had been on a "meth bender".  Patient does have a incision with sutures over the right anterior knee from a presumed recent surgery that has had clear drainage throughout EMS transport.  Patient has to be awoken to answer any questions and he will often only answer in a one-word answer.  Patient is currently oriented to self and place but unclear of time and situation.  Patient is complaining of pain over the right knee as well as intermittent cramping throughout all musculature.  EMS state the patient does have questionable history of liver disease and noticed jaundice with abdominal distention.  Further history and review of systems are unable to be obtained at this time secondary to pain distance mental status.   Physical Exam  Triage Vital Signs: ED Triage Vitals  Enc Vitals Group     BP      Pulse      Resp      Temp      Temp src      SpO2      Weight      Height      Head Circumference      Peak Flow      Pain Score      Pain Loc      Pain Edu?      Excl. in GC?    Most recent vital signs: Vitals:   06/01/22 1402 06/01/22 1515  BP:  120/65  Pulse: (!) 111 (!) 117  Resp: (!) 25 (!) 27  Temp:    SpO2:  96%   General: Asleep but arousable to voice, oriented to place and self but not situation or time CV:  Good peripheral perfusion.  Resp:  Normal effort.  Abd:  Distention with fluid wave and large ecchymosis to the suprapubic/right lower quadrant area Other:  Middle-aged overweight Caucasian male asleep in bed easily arousable to voice but disoriented and intermittently complaining of cramping pains.  2+ pitting edema to  bilateral lower extremities with a 5 cm vertical midline incision over the right anterior knee that is draining clear fluid with sutures in place however surrounding erythema and induration ED Results / Procedures / Treatments  Labs (all labs ordered are listed, but only abnormal results are displayed) Labs Reviewed  LACTIC ACID, PLASMA - Abnormal; Notable for the following components:      Result Value   Lactic Acid, Venous 2.7 (*)    All other components within normal limits  LACTIC ACID, PLASMA - Abnormal; Notable for the following components:   Lactic Acid, Venous 2.0 (*)    All other components within normal limits  COMPREHENSIVE METABOLIC PANEL - Abnormal; Notable for the following components:   Sodium 131 (*)    Potassium 3.4 (*)    Glucose, Bld 123 (*)    BUN 22 (*)    Calcium 8.2 (*)    Total Protein 6.0 (*)    Albumin 2.4 (*)    AST 50 (*)    Total Bilirubin 5.1 (*)    All other components within normal limits  CBC WITH DIFFERENTIAL/PLATELET - Abnormal; Notable for the following  components:   RBC 3.20 (*)    Hemoglobin 10.7 (*)    HCT 31.6 (*)    RDW 15.9 (*)    Platelets 55 (*)    Lymphs Abs 0.3 (*)    All other components within normal limits  PROTIME-INR - Abnormal; Notable for the following components:   Prothrombin Time 17.8 (*)    INR 1.5 (*)    All other components within normal limits  URINALYSIS, COMPLETE (UACMP) WITH MICROSCOPIC - Abnormal; Notable for the following components:   Color, Urine AMBER (*)    APPearance CLEAR (*)    All other components within normal limits  URINE DRUG SCREEN, QUALITATIVE ([facility] ONLY) - Abnormal; Notable for the following components:   Amphetamines, Ur Screen POSITIVE (*)    Cannabinoid 50 Ng, Ur [place] POSITIVE (*)    All other components within normal limits  RESP PANEL BY RT-PCR (FLU A&B, COVID) ARPGX2  CULTURE, BLOOD (ROUTINE X 2)  CULTURE, BLOOD (ROUTINE X 2)  URINE CULTURE  APTT  AMMONIA   EKG ED ECG REPORT I, Merwyn Katos, the attending physician, personally viewed and interpreted this ECG. Date: 06/01/2022 EKG Time: 1144 Rate: 104 Rhythm: Tachycardic sinus rhythm QRS Axis: normal Intervals: normal ST/T Wave abnormalities: normal Narrative Interpretation: Tachycardic sinus rhythm.  No evidence of acute ischemia RADIOLOGY ED MD interpretation: MRI of the right knee without contrast shows diffuse skin thickening and soft tissue swelling along the knee compatible with a cellulitis as well as a fluid collection measuring 4 x 1 x 3 with communication to the skin along the prepatellar soft tissue as well as an unchanged small joint effusion  CT of the head without contrast interpreted by me shows no evidence of acute abnormalities including no intracerebral hemorrhage, obvious masses, or significant edema  One-view portable chest x-ray interpreted by me shows no evidence of acute abnormalities including no pneumonia, pneumothorax, or widened mediastinum.  Central vascular congestion -Agree with radiology assessment Official radiology report(s): MR KNEE RIGHT WO CONTRAST  Result Date: 06/01/2022 CLINICAL DATA:  Osteomyelitis suspected, knee, xray done EXAM: MRI OF THE RIGHT KNEE WITHOUT CONTRAST TECHNIQUE: Multiplanar, multisequence MR imaging of the knee was performed. No intravenous contrast was administered. COMPARISON:  Right knee MRI 05/21/2022 FINDINGS: MENISCI Medial: Undersurface tear of the posterior horn and body of the medial meniscus, with mild protrusion of meniscus tissue into the medial inferior gutter, similar to prior. Lateral: Intact lateral meniscus. LIGAMENTS Cruciates: ACL and PCL are intact. Collaterals: Medial collateral ligament is intact. Lateral collateral ligament complex is intact. CARTILAGE Patellofemoral:  Mild chondrosis.  No focal chondral defect. Medial:  Mild chondrosis.  No focal chondral defect Lateral:  Mild chondrosis.  No focal chondral defect. JOINT: Small joint effusion.  POPLITEAL FOSSA: MinisculeBaker's cyst. EXTENSOR MECHANISM: Intact quadriceps tendon. Intact patellar tendon. BONES: No evidence of acute fracture. Decreased marrow edema within the patella. Preserved T1 marrow signal throughout. Other: Diffuse, circumferential skin thickening and soft tissue swelling along the knee. There is a subcutaneous fluid collection in the prepatellar soft tissues, measuring 4.3 x 1.0 x 3.4 cm (volume = 7.7 cm^3), previously measuring 4.0 x 1.0 x 3.7 cm (volume = 7.7 cm^3), not significantly changed. There appears to be a communication with the skin surface anteriorly (axial PD image 21). IMPRESSION: Diffuse skin thickening and soft tissue swelling along the knee, similar prior, compatible with cellulitis. No significant change in size of the prepatellar soft tissue fluid collection, measuring 4.3 x 1.0 x 3.4  cm, with communication with the skin surface anteriorly, correlate with drainage. Unchanged small joint effusion, likely reactive. Decreased marrow edema in the patella with preserved T1 marrow signal, suggesting resolving contusion or reactive marrow change. No evidence of osteomyelitis or convincing findings to suggest septic arthritis. Unchanged mild tricompartment osteoarthritis and undersurface tear of the medial meniscus. Electronically Signed   By: Caprice Renshaw M.D.   On: 06/01/2022 15:17   CT Head Wo Contrast  Result Date: 06/01/2022 CLINICAL DATA:  Mental status change, unknown cause EXAM: CT HEAD WITHOUT CONTRAST TECHNIQUE: Contiguous axial images were obtained from the base of the skull through the vertex without intravenous contrast. RADIATION DOSE REDUCTION: This exam was performed according to the departmental dose-optimization program which includes automated exposure control, adjustment of the mA and/or kV according to patient size and/or use of iterative reconstruction technique. COMPARISON:  04/20/2022 FINDINGS: Brain: There is no acute intracranial hemorrhage, mass  effect, or edema. No acute appearing loss of gray-white differentiation. Left anteroinferior frontal encephalomalacia and anterior left temporal encephalomalacia again noted. There is no extra-axial fluid collection. Ventricles and sulci are within normal limits in size and configuration. Vascular: No hyperdense vessel or unexpected calcification. Skull: Calvarium is unremarkable. Sinuses/Orbits: No acute finding. Other: None. IMPRESSION: No acute intracranial abnormality. Stable chronic findings detailed above. Electronically Signed   By: Guadlupe Spanish M.D.   On: 06/01/2022 13:57   DG Chest Port 1 View  Result Date: 06/01/2022 CLINICAL DATA:  Questionable sepsis. EXAM: PORTABLE CHEST 1 VIEW COMPARISON:  None Available. FINDINGS: Trachea is midline. Heart size is accentuated by AP technique and low lung volumes. Mild central pulmonary vascular congestion. No airspace consolidation or pleural fluid. IMPRESSION: Central pulmonary vascular congestion may be due in part to low lung volumes. Difficult to exclude impending edema. Electronically Signed   By: Leanna Battles M.D.   On: 06/01/2022 13:12   PROCEDURES: Critical Care performed: Yes, see critical care procedure note(s) .1-3 Lead EKG Interpretation  Performed by: Merwyn Katos, MD Authorized by: Merwyn Katos, MD     Interpretation: abnormal     ECG rate:  115   ECG rate assessment: tachycardic     Rhythm: sinus tachycardia     Ectopy: none     Conduction: normal   CRITICAL CARE Performed by: Merwyn Katos  Total critical care time: 35 minutes  Critical care time was exclusive of separately billable procedures and treating other patients.  Critical care was necessary to treat or prevent imminent or life-threatening deterioration.  Critical care was time spent personally by me on the following activities: development of treatment plan with patient and/or surrogate as well as nursing, discussions with consultants, evaluation of  patient's response to treatment, examination of patient, obtaining history from patient or surrogate, ordering and performing treatments and interventions, ordering and review of laboratory studies, ordering and review of radiographic studies, pulse oximetry and re-evaluation of patient's condition.  MEDICATIONS ORDERED IN ED: Medications  lactated ringers infusion (0 mLs Intravenous Hold 06/01/22 1233)  lactated ringers bolus 1,000 mL (0 mLs Intravenous Stopped 06/01/22 1315)    And  lactated ringers bolus 1,000 mL (0 mLs Intravenous Stopped 06/01/22 1330)    And  lactated ringers bolus 1,000 mL (0 mLs Intravenous Stopped 06/01/22 1504)    And  lactated ringers bolus 500 mL (0 mLs Intravenous Hold 06/01/22 1316)  vancomycin (VANCOCIN) IVPB 1000 mg/200 mL premix (0 mg Intravenous Stopped 06/01/22 1504)  cefTRIAXone (ROCEPHIN) 2 g in sodium chloride  0.9 % 100 mL IVPB (0 g Intravenous Stopped 06/01/22 1303)   IMPRESSION / MDM / ASSESSMENT AND PLAN / ED COURSE  I reviewed the triage vital signs and the nursing notes.                             Differential diagnosis includes, but is not limited to, septic arthritis, hepatic encephalopathy, methamphetamine intoxication The patient is on the cardiac monitor to evaluate for evidence of arrhythmia and/or significant heart rate changes. Patient's presentation is most consistent with acute presentation with potential threat to life or bodily function. The Pt presents with altered mental status, tachycardia, and draining knee wound highly concerning for sepsis (suspected right knee source). At this time, the Pt is satting well on room air, normotensive, and appears HDS.  Will start empiric antibiotics and fluids.  Due to normotension, will administer fluids gradually with frequent reassessment. Have low suspicion for a GI, skin/soft tissue, or CNS source at this time, but will reconsider if initial workup is unremarkable.  - CBC, BMP, LFTs - VBG - UA - BCx x2,  Lactate - EKG - CXR -MRI of the knee on the right shows continued evidence of cellulitis as well as prepatellar fluid collection - Empiric Abx: Rocephin, vancomycin - Fluids: 30cc/kg lactated Ringer's  Consults: Orthopedics: I spoke to Dr. Okey Dupre who agrees with plan for Noncon MRI of the right knee and admission to the medicine service Hospitalist: I spoke to Dr. Allena Katz who agrees with plan for admission to the internal medicine service for further evaluation and management  Dispo: Admit to medicine   FINAL CLINICAL IMPRESSION(S) / ED DIAGNOSES   Final diagnoses:  Sepsis without acute organ dysfunction, due to unspecified organism (HCC)  Cellulitis of right knee  Somnolence   Rx / DC Orders   ED Discharge Orders     None      Note:  This document was prepared using Dragon voice recognition software and may include unintentional dictation errors.   Merwyn Katos, MD 06/01/22 [id number]

## 2022-06-01 NOTE — Assessment & Plan Note (Signed)
    Latest Ref Rng & Units 06/01/2022   11:53 AM 05/24/2022    8:34 AM 05/22/2022    6:22 AM  CBC  WBC 4.0 - 10.5 K/uL 6.2  3.9  5.4   Hemoglobin 13.0 - 17.0 g/dL 16.3  84.5  36.4   Hematocrit 39.0 - 52.0 % 31.6  31.2  30.8   Platelets 150 - 400 K/uL 55  57  55   stable will follow Type and screen.

## 2022-06-01 NOTE — Sepsis Progress Note (Signed)
Code Sepsis protocol being monitored by eLink. 

## 2022-06-01 NOTE — ED Notes (Addendum)
RN attempted to twice to collect Blood culture. Unable to establish another IV or straight stick. RN notified provider.

## 2022-06-02 ENCOUNTER — Other Ambulatory Visit: Payer: Self-pay

## 2022-06-02 ENCOUNTER — Inpatient Hospital Stay: Payer: Self-pay

## 2022-06-02 ENCOUNTER — Encounter: Payer: Self-pay | Admitting: Internal Medicine

## 2022-06-02 DIAGNOSIS — K7460 Unspecified cirrhosis of liver: Secondary | ICD-10-CM

## 2022-06-02 DIAGNOSIS — M009 Pyogenic arthritis, unspecified: Secondary | ICD-10-CM

## 2022-06-02 DIAGNOSIS — A498 Other bacterial infections of unspecified site: Secondary | ICD-10-CM

## 2022-06-02 DIAGNOSIS — K7031 Alcoholic cirrhosis of liver with ascites: Secondary | ICD-10-CM

## 2022-06-02 DIAGNOSIS — D649 Anemia, unspecified: Secondary | ICD-10-CM

## 2022-06-02 DIAGNOSIS — F1910 Other psychoactive substance abuse, uncomplicated: Secondary | ICD-10-CM

## 2022-06-02 DIAGNOSIS — D696 Thrombocytopenia, unspecified: Secondary | ICD-10-CM

## 2022-06-02 DIAGNOSIS — K766 Portal hypertension: Secondary | ICD-10-CM

## 2022-06-02 DIAGNOSIS — R7881 Bacteremia: Secondary | ICD-10-CM

## 2022-06-02 DIAGNOSIS — E876 Hypokalemia: Secondary | ICD-10-CM

## 2022-06-02 DIAGNOSIS — R188 Other ascites: Secondary | ICD-10-CM

## 2022-06-02 DIAGNOSIS — Z1612 Extended spectrum beta lactamase (ESBL) resistance: Secondary | ICD-10-CM

## 2022-06-02 LAB — BLOOD CULTURE ID PANEL (REFLEXED) - BCID2

## 2022-06-02 LAB — BODY FLUID CELL COUNT WITH DIFFERENTIAL
Eos, Fluid: 0 %
Lymphs, Fluid: 2 %
Monocyte-Macrophage-Serous Fluid: 19 %
Neutrophil Count, Fluid: 79 %
Total Nucleated Cell Count, Fluid: 16640 cu mm

## 2022-06-02 LAB — HEPATITIS B SURFACE ANTIGEN: Hepatitis B Surface Ag: NONREACTIVE

## 2022-06-02 LAB — COMPREHENSIVE METABOLIC PANEL
ALT: 31 U/L (ref 0–44)
AST: 37 U/L (ref 15–41)
Albumin: 2 g/dL — ABNORMAL LOW (ref 3.5–5.0)
Alkaline Phosphatase: 81 U/L (ref 38–126)
Anion gap: 4 — ABNORMAL LOW (ref 5–15)
BUN: 22 mg/dL — ABNORMAL HIGH (ref 6–20)
CO2: 24 mmol/L (ref 22–32)
Calcium: 7.7 mg/dL — ABNORMAL LOW (ref 8.9–10.3)
Chloride: 102 mmol/L (ref 98–111)
Creatinine, Ser: 0.8 mg/dL (ref 0.61–1.24)
GFR, Estimated: >60 mL/min (ref >60)
Glucose, Bld: 107 mg/dL — ABNORMAL HIGH (ref 70–99)
Potassium: 3.5 mmol/L (ref 3.5–5.1)
Sodium: 130 mmol/L — ABNORMAL LOW (ref 135–145)
Total Bilirubin: 5.4 mg/dL — ABNORMAL HIGH (ref 0.3–1.2)
Total Protein: 4.9 g/dL — ABNORMAL LOW (ref 6.5–8.1)

## 2022-06-02 LAB — URINE CULTURE: Culture: NO GROWTH

## 2022-06-02 LAB — CBC
HCT: 25.3 % — ABNORMAL LOW (ref 39.0–52.0)
Hemoglobin: 8.8 g/dL — ABNORMAL LOW (ref 13.0–17.0)
MCH: 34.2 pg — ABNORMAL HIGH (ref 26.0–34.0)
MCHC: 34.8 g/dL (ref 30.0–36.0)
MCV: 98.4 fL (ref 80.0–100.0)
Platelets: 35 10*3/uL — ABNORMAL LOW (ref 150–400)
RBC: 2.57 MIL/uL — ABNORMAL LOW (ref 4.22–5.81)
RDW: 15.8 % — ABNORMAL HIGH (ref 11.5–15.5)
WBC: 9.8 10*3/uL (ref 4.0–10.5)
nRBC: 0 % (ref 0.0–0.2)

## 2022-06-02 LAB — LACTIC ACID, PLASMA
Lactic Acid, Venous: 1.7 mmol/L (ref 0.5–1.9)
Lactic Acid, Venous: 1.6 mmol/L (ref 0.5–1.9)

## 2022-06-02 LAB — TYPE AND SCREEN
ABO/RH(D): O NEG
Antibody Screen: NEGATIVE

## 2022-06-02 LAB — BASIC METABOLIC PANEL
Anion gap: 5 (ref 5–15)
BUN: 21 mg/dL — ABNORMAL HIGH (ref 6–20)
CO2: 24 mmol/L (ref 22–32)
Calcium: 7.8 mg/dL — ABNORMAL LOW (ref 8.9–10.3)
Chloride: 103 mmol/L (ref 98–111)
Creatinine, Ser: 0.7 mg/dL (ref 0.61–1.24)
GFR, Estimated: >60 mL/min (ref >60)
Glucose, Bld: 109 mg/dL — ABNORMAL HIGH (ref 70–99)
Potassium: 3.6 mmol/L (ref 3.5–5.1)
Sodium: 132 mmol/L — ABNORMAL LOW (ref 135–145)

## 2022-06-02 LAB — HEPATITIS PANEL, ACUTE
HCV Ab: REACTIVE — AB
Hep A IgM: NONREACTIVE
Hep B C IgM: NONREACTIVE
Hepatitis B Surface Ag: NONREACTIVE

## 2022-06-02 LAB — HEMOGLOBIN AND HEMATOCRIT, BLOOD
HCT: 26.1 % — ABNORMAL LOW (ref 39.0–52.0)
Hemoglobin: 9 g/dL — ABNORMAL LOW (ref 13.0–17.0)

## 2022-06-02 LAB — PROTEIN, PLEURAL OR PERITONEAL FLUID: Total protein, fluid: <3 g/dL

## 2022-06-02 LAB — AMMONIA: Ammonia: 36 umol/L — ABNORMAL HIGH (ref 9–35)

## 2022-06-02 LAB — BILIRUBIN, FRACTIONATED(TOT/DIR/INDIR)
Bilirubin, Direct: 1.5 mg/dL — ABNORMAL HIGH (ref 0.0–0.2)
Indirect Bilirubin: 4.1 mg/dL — ABNORMAL HIGH (ref 0.3–0.9)
Total Bilirubin: 5.6 mg/dL — ABNORMAL HIGH (ref 0.3–1.2)

## 2022-06-02 LAB — PROCALCITONIN: Procalcitonin: 2.16 ng/mL

## 2022-06-02 LAB — GLUCOSE, CAPILLARY: Glucose-Capillary: 108 mg/dL — ABNORMAL HIGH (ref 70–99)

## 2022-06-02 LAB — MAGNESIUM: Magnesium: 1.7 mg/dL (ref 1.7–2.4)

## 2022-06-02 LAB — ALBUMIN, PLEURAL OR PERITONEAL FLUID: Albumin, Fluid: <1.5 g/dL

## 2022-06-02 LAB — HEPATITIS B CORE ANTIBODY, TOTAL: Hep B Core Total Ab: NONREACTIVE

## 2022-06-02 MED ORDER — VANCOMYCIN HCL 1500 MG/300ML IV SOLN
1500.0000 mg | Freq: Two times a day (BID) | INTRAVENOUS | Status: DC
  Administered 2022-06-02 – 2022-06-03 (×3): 1500 mg via INTRAVENOUS
  Filled 2022-06-02 (×5): qty 300

## 2022-06-02 MED ORDER — ALBUMIN HUMAN 25 % IV SOLN
165.0000 g | Freq: Once | INTRAVENOUS | Status: AC
Start: 2022-06-02 — End: 2022-06-02
  Administered 2022-06-02: 162.5 g via INTRAVENOUS
  Filled 2022-06-02: qty 650

## 2022-06-02 MED ORDER — SODIUM CHLORIDE 0.9 % IV SOLN
1.0000 g | Freq: Three times a day (TID) | INTRAVENOUS | Status: DC
  Administered 2022-06-02 – 2022-06-15 (×41): 1 g via INTRAVENOUS
  Filled 2022-06-02: qty 20
  Filled 2022-06-02 (×3): qty 1
  Filled 2022-06-02 (×3): qty 20
  Filled 2022-06-02: qty 1
  Filled 2022-06-02 (×4): qty 20
  Filled 2022-06-02 (×3): qty 1
  Filled 2022-06-02 (×4): qty 20
  Filled 2022-06-02: qty 1
  Filled 2022-06-02 (×11): qty 20
  Filled 2022-06-02: qty 1
  Filled 2022-06-02 (×2): qty 20
  Filled 2022-06-02: qty 1
  Filled 2022-06-02 (×3): qty 20
  Filled 2022-06-02: qty 1
  Filled 2022-06-02 (×3): qty 20
  Filled 2022-06-02: qty 1
  Filled 2022-06-02: qty 20

## 2022-06-02 MED ORDER — ALBUMIN HUMAN 25 % IV SOLN
25.0000 g | Freq: Once | INTRAVENOUS | Status: DC | PRN
Start: 2022-06-02 — End: 2022-06-16

## 2022-06-02 MED ORDER — ALBUMIN HUMAN 25 % IV SOLN
112.5000 g | Freq: Once | INTRAVENOUS | Status: AC
Start: 2022-06-05 — End: 2022-06-05
  Administered 2022-06-05: 112.5 g via INTRAVENOUS
  Filled 2022-06-02: qty 450

## 2022-06-02 MED ORDER — BENZOCAINE 10 % MT GEL: Freq: Four times a day (QID) | OROMUCOSAL | Status: DC | PRN

## 2022-06-02 MED ORDER — VANCOMYCIN HCL 2000 MG/400ML IV SOLN
2000.0000 mg | Freq: Once | INTRAVENOUS | Status: AC
  Administered 2022-06-02: 2000 mg via INTRAVENOUS
  Filled 2022-06-02: qty 400

## 2022-06-02 MED ORDER — METHOCARBAMOL 500 MG PO TABS
500.0000 mg | ORAL_TABLET | Freq: Four times a day (QID) | ORAL | Status: AC | PRN
  Administered 2022-06-03 (×2): 500 mg via ORAL
  Filled 2022-06-02 (×3): qty 1

## 2022-06-02 NOTE — Progress Notes (Signed)
Pharmacy Antibiotic Note  Marco Spears is a 45 y.o. male admitted on 06/01/2022 with bacteremia (E. Coli w/ ESBL).  Pharmacy has been consulted for Meropenem dosing.  Plan:  Meropenem 1 gm q8h per indication & renal fxn  Pharmacy will continue to follow and will adjust abx dosing whenever warranted.  Height: 6\' 3"  (190.5 cm) Weight: 107.7 kg (237 lb 7 oz) IBW/kg (Calculated) : 84.5  Temp (24hrs), Avg:98.4 F (36.9 C), Min:98.4 F (36.9 C), Max:98.5 F (36.9 C)   Recent Labs  Lab 06/01/22 1153 06/01/22 1405  WBC 6.2  --   CREATININE 0.95  --   LATICACIDVEN 2.7* 2.0*    Estimated Creatinine Clearance: 126.2 mL/min (by C-G formula based on SCr of 0.95 mg/dL).    Allergies  Allergen Reactions   Amoxicillin Rash    TOLERATED CEFAZOLIN PRIOR States it makes his skin turn red    Antimicrobials this admission: 8/07 Ceftriaxone >> x 1 8/07 Vancomycin >> x 1 8/08 Meropenem >>   Microbiology results: 8/08 BCx: 1 (anaerobic) of 4 w/ E coli, ESBL detected  8/07 UCx: Pending   Thank you for allowing pharmacy to be a part of this patient's care.  10/07, PharmD, MBA 06/02/2022 1:01 AM

## 2022-06-02 NOTE — Consult Note (Signed)
NAME: Marco Spears  DOB: [birth year]  MRN: [id number]  Date/Time: 06/02/2022 12:21 PM  REQUESTING PROVIDER: Dr.Amery Subjective:  REASON FOR CONSULT: ESBL e.coli bacteremia ?Chart reviewed completely- no history from patient as he is lethargic Marco Spears is a 46 y.o. 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 He was picked by EMS from a home where he was doing meth He was c/o rt knee pain and was also restless, agitated And c/o muscle cramps Not sure whether he was taking linezold which can cause interaction when taken with meth  06/01/22  BP 116/68  Temp 98.5 F (36.9 C)  Pulse Rate 103 !  Resp 21 !  SpO2 94 %    Latest Reference Range & Units 06/01/22  WBC 4.0 - 10.5 K/uL 6.2  Hemoglobin 13.0 - 17.0 g/dL 78.2 (L)  HCT 95.6 - 21.3 % 31.6 (L)  Platelets 150 - 400 K/uL 55 (L) [1]  Creatinine 0.61 - 1.24 mg/dL 0.86     ID  Blood culture sent and he was started on IV ceftriaxone , which was changed to meropenem as culture came positive for ESBL e.coli I am seeing the patient for the same Past Medical History:  Diagnosis Date   Cirrhosis of liver (HCC)    Heart murmur    Polysubstance abuse (HCC)    Septic bursitis     Past Surgical History:  Procedure Laterality Date   INCISION AND DRAINAGE Right 05/07/2022   Procedure: INCISION AND DRAINAGE;  Surgeon: Juanell Fairly, MD;  Location: [facility] ORS;  Service: Orthopedics;  Laterality: Right;   IRRIGATION AND  DEBRIDEMENT KNEE Right 05/21/2022   Procedure: IRRIGATION AND DEBRIDEMENT KNEE;  Surgeon: Juanell Fairly, MD;  Location: [facility] ORS;  Service: Orthopedics;  Laterality: Right;    Social History   Socioeconomic History   Marital status: Single    Spouse name: Not on file   Number of children: Not on file   Years of education: Not on file   Highest education level: Not on file  Occupational History   Not on file  Tobacco Use   Smoking status: Some Days    Types: Cigarettes   Smokeless tobacco: Never  Substance and Sexual Activity   Alcohol use: Never   Drug use: Yes    Types: Marijuana, Methamphetamines, IV   Sexual activity: Not on file  Other Topics Concern   Not on file  Social History Narrative   Not on file   Social Determinants of Health   Financial Resource Strain: Not on file  Food Insecurity: Not on file  Transportation Needs: Not on file  Physical Activity: Not on file  Stress: Not on file  Social Connections: Not on file  Intimate Partner Violence: Not on file    History reviewed. No pertinent family history. Allergies  Allergen Reactions   Amoxicillin Rash    TOLERATED CEFAZOLIN PRIOR States it makes his skin turn red   I? Current Facility-Administered Medications  Medication Dose  Route Frequency Provider Last Rate Last Admin   0.9 %  sodium chloride infusion   Intravenous Continuous Gertha CalkinPatel, Ekta V, MD   Held at 06/01/22 1641   acetaminophen (TYLENOL) tablet 650 mg  650 mg Oral Q6H PRN Gertha CalkinPatel, Ekta V, MD   650 mg at 06/02/22 [id number]   Or   acetaminophen (TYLENOL) suppository 650 mg  650 mg Rectal Q6H PRN Gertha CalkinPatel, Ekta V, MD       albumin human 25 % solution 25 g  25 g Intravenous Once PRN Jaynie Collinsusso, Steven Michael, DO       benzocaine (ORAJEL) 10 % mucosal gel   Mouth/Throat QID PRN Lynn ItoAmery, Sahar, MD       Chlorhexidine Gluconate Cloth 2 % PADS 6 each  6 each Topical Q0600 Gertha CalkinPatel, Ekta V, MD   6 each at 06/02/22 0503   dextrose 5 %-0.45 % sodium chloride infusion    Intravenous Continuous Gertha CalkinPatel, Ekta V, MD 30 mL/hr at 06/01/22 2105 New Bag at 06/01/22 2105   lactated ringers bolus 500 mL  500 mL Intravenous Once Merwyn KatosBradler, Evan K, MD   Held at 06/01/22 1316   meropenem (MERREM) 1 g in sodium chloride 0.9 % 100 mL IVPB  1 g Intravenous Q8H Otelia SergeantBelue, Nathan S, RPH 200 mL/hr at 06/02/22 0902 1 g at 06/02/22 0902   morphine (PF) 2 MG/ML injection 2 mg  2 mg Intravenous Q4H PRN Gertha CalkinPatel, Ekta V, MD       nicotine (NICODERM CQ - dosed in mg/24 hours) patch 21 mg  21 mg Transdermal Daily Gertha CalkinPatel, Ekta V, MD       Oral care mouth rinse  15 mL Mouth Rinse PRN Gertha CalkinPatel, Ekta V, MD       sodium chloride flush (NS) 0.9 % injection 3 mL  3 mL Intravenous Q12H Irena CordsPatel, Ekta V, MD   3 mL at 06/02/22 0902   thiamine (VITAMIN B1) injection 100 mg  100 mg Intravenous Daily Gertha CalkinPatel, Ekta V, MD       vancomycin (VANCOREADY) IVPB 2000 mg/400 mL  2,000 mg Intravenous Once Tressie EllisChappell, Alex B, RPH 200 mL/hr at 06/02/22 1109 2,000 mg at 06/02/22 1109     Abtx:  Anti-infectives (From admission, onward)    Start     Dose/Rate Route Frequency Ordered Stop   06/02/22 1100  vancomycin (VANCOREADY) IVPB 2000 mg/400 mL        2,000 mg 200 mL/hr over 120 Minutes Intravenous  Once 06/02/22 0951     06/02/22 0200  meropenem (MERREM) 1 g in sodium chloride 0.9 % 100 mL IVPB        1 g 200 mL/hr over 30 Minutes Intravenous Every 8 hours 06/02/22 0055     06/01/22 1215  vancomycin (VANCOCIN) IVPB 1000 mg/200 mL premix        1,000 mg 200 mL/hr over 60 Minutes Intravenous  Once 06/01/22 1205 06/01/22 1504   06/01/22 1215  cefTRIAXone (ROCEPHIN) 2 g in sodium chloride 0.9 % 100 mL IVPB        2 g 200 mL/hr over 30 Minutes Intravenous  Once 06/01/22 1205 06/01/22 1303       REVIEW OF SYSTEMS:  NA Objective:  VITALS:  BP 118/71   Pulse 99   Temp 98.2 F (36.8 C)   Resp (!) 21   Ht 6\' 3"  (1.905 m)   Wt 107.7 kg   SpO2 94%   BMI 29.68 kg/m   PHYSICAL EXAM:  General: somnolent On  calling his  name he opens eyes and talks. Says he has abdominal pain and muscle cramps Lungs: b/la air entry Heart: Tachycardia Abdomen: Soft, distended- tender- bruising  Extremities: edema legs Rt knee covered with wound vac Skin: No rashes or lesions. Or bruising Lymph: Cervical, supraclavicular normal. Neurologic: Grossly non-focal Pertinent Labs Lab Results CBC    Component Value Date/Time   WBC 9.8 06/02/2022 0410   RBC 2.57 (L) 06/02/2022 0410   HGB 8.8 (L) 06/02/2022 0410   HGB 16.1 08/23/2012 1434   HCT 25.3 (L) 06/02/2022 0410   HCT 45.4 08/23/2012 1434   PLT 35 (L) 06/02/2022 0410   PLT 133 (L) 08/23/2012 1434   MCV 98.4 06/02/2022 0410   MCV 93 08/23/2012 1434   MCH 34.2 (H) 06/02/2022 0410   MCHC 34.8 06/02/2022 0410   RDW 15.8 (H) 06/02/2022 0410   RDW 13.4 08/23/2012 1434   LYMPHSABS 0.3 (L) 06/01/2022 1153   LYMPHSABS 1.2 08/23/2012 1434   MONOABS 0.2 06/01/2022 1153   MONOABS 0.7 08/23/2012 1434   EOSABS 0.1 06/01/2022 1153   EOSABS 0.0 08/23/2012 1434   BASOSABS 0.0 06/01/2022 1153   BASOSABS 0.0 08/23/2012 1434       Latest Ref Rng & Units 06/02/2022    4:10 AM 06/02/2022    2:02 AM 06/01/2022   11:53 AM  CMP  Glucose 70 - 99 mg/dL 149  702  637   BUN 6 - 20 mg/dL 22  21  22    Creatinine 0.61 - 1.24 mg/dL  8.58  8.50   Sodium 135 - 145 mmol/L 130  132  131   Potassium 3.5 - 5.1 mmol/L 3.5  3.6  3.4   Chloride 98 - 111 mmol/L 102  103  100   CO2 22 - 32 mmol/L 24  24  24    Calcium 8.9 - 10.3 mg/dL 7.7  7.8  8.2   Total Protein 6.5 - 8.1 g/dL 4.9   6.0   Total Bilirubin 0.3 - 1.2 mg/dL 5.4  5.6  5.1   Alkaline Phos 38 - 126 U/L 81   115   AST 15 - 41 U/L 37   50   ALT 0 - 44 U/L 31   38       Microbiology: Recent Results (from the past 240 hour(s))  Resp Panel by RT-PCR (Flu A&B, Covid) Anterior Nasal Swab     Status: None   Collection Time: 06/01/22 12:34 PM   Specimen: Anterior Nasal Swab  Result Value Ref Range Status   SARS Coronavirus 2 by  RT PCR NEGATIVE NEGATIVE Final    Comment: (NOTE) SARS-CoV-2 target nucleic acids are NOT DETECTED.  The SARS-CoV-2 RNA is generally detectable in upper respiratory specimens during the acute phase of infection. The lowest concentration of SARS-CoV-2 viral copies this assay can detect is 138 copies/mL. A negative result does not preclude SARS-Cov-2 infection and should not be used as the sole basis for treatment or other patient management decisions. A negative result may occur with  improper specimen collection/handling, submission of specimen other than nasopharyngeal swab, presence of viral mutation(s) within the areas targeted by this assay, and inadequate number of viral copies(<138 copies/mL). A negative result must be combined with clinical observations, patient history, and epidemiological information. The expected result is Negative.  Fact Sheet for Patients:   Fact Sheet for Healthcare Providers:  07/30/2022  This test is no t yet approved or cleared by the [employer] FDA  and  has been authorized for detection and/or diagnosis of SARS-CoV-2 by FDA under an Emergency Use Authorization (EUA). This EUA will remain  in effect (meaning this test can be used) for the duration of the COVID-19 declaration under Section 564(b)(1) of the Act, 21 U.S.C.section 360bbb-3(b)(1), unless the authorization is terminated  or revoked sooner.       Influenza A by PCR NEGATIVE NEGATIVE Final   Influenza B by PCR NEGATIVE NEGATIVE Final    Comment: (NOTE) The Xpert Xpress SARS-CoV-2/FLU/RSV plus assay is intended as an aid in the diagnosis of influenza from Nasopharyngeal swab specimens and should not be used as a sole basis for treatment. Nasal washings and aspirates are unacceptable for Xpert Xpress SARS-CoV-2/FLU/RSV testing.  Fact Sheet for Patients: [employer]  Fact Sheet  for Healthcare Providers: [employer]ker[employer]it  This test is not yet approved or cleared by the [place] FDA and has been authorized for detection and/or diagnosis of SARS-CoV-2 by FDA under an Emergency Use Authorization (EUA). This EUA will remain in effect (meaning this test can be used) for the duration of the COVID-19 declaration under Section 564(b)(1) of the Act, 21 U.S.C. section 360bbb-3(b)(1), unless the authorization is terminated or revoked.  Performed at [facility], [address] [place], [place] [address]   Blood Culture (routine x 2)     Status: None (Preliminary result)   Collection Time: 06/01/22 12:34 PM   Specimen: BLOOD  Result Value Ref Range Status   Specimen Description   Final    BLOOD Blood Culture adequate volume Performed at [facility], [address] [place], [place] [address]    Special Requests   Final    BOTTLES DRAWN AEROBIC AND ANAEROBIC LEFT ANTECUBITAL Performed at [facility], [address] [place], [place] [address]    Culture  Setup Time   Final    Organism ID to follow GRAM NEGATIVE RODS ANAEROBIC BOTTLE ONLY CRITICAL RESULT CALLED TO, READ BACK BY AND VERIFIED WITH: NATHAN BELUE AT 0017 ON 06/02/22 BY SS    Culture GRAM NEGATIVE RODS  Final   Report Status PENDING  Incomplete  Blood Culture (routine x 2)     Status: None (Preliminary result)   Collection Time: 06/01/22 12:34 PM   Specimen: BLOOD  Result Value Ref Range Status   Specimen Description   Final    BLOOD Blood Culture results may not be optimal due to an inadequate volume of blood received in culture bottles   Special Requests BOTTLES DRAWN AEROBIC AND ANAEROBIC WRIST  Final   Culture   Final    NO GROWTH < 24 HOURS Performed at [facility], [address] [place], [place] [address]    Report Status PENDING  Incomplete  Blood Culture ID Panel (Reflexed)     Status: Abnormal   Collection Time:  06/01/22 12:34 PM  Result Value Ref Range Status   Enterococcus faecalis NOT DETECTED NOT DETECTED Final   Enterococcus Faecium NOT DETECTED NOT DETECTED Final   Listeria monocytogenes NOT DETECTED NOT DETECTED Final   Staphylococcus species NOT DETECTED NOT DETECTED Final   Staphylococcus aureus (BCID) NOT DETECTED NOT DETECTED Final   Staphylococcus epidermidis NOT DETECTED NOT DETECTED Final   Staphylococcus lugdunensis NOT DETECTED NOT DETECTED Final   Streptococcus species NOT DETECTED NOT DETECTED Final   Streptococcus agalactiae NOT DETECTED NOT DETECTED Final   Streptococcus pneumoniae NOT DETECTED NOT DETECTED Final   Streptococcus pyogenes NOT DETECTED NOT DETECTED Final  A.calcoaceticus-baumannii NOT DETECTED NOT DETECTED Final   Bacteroides fragilis NOT DETECTED NOT DETECTED Final   Enterobacterales DETECTED (A) NOT DETECTED Final    Comment: Enterobacterales represent a large order of gram negative bacteria, not a single organism. CRITICAL RESULT CALLED TO, READ BACK BY AND VERIFIED WITH: NATHAN BELUE AT 0017 ON 06/02/22 BY SS    Enterobacter cloacae complex NOT DETECTED NOT DETECTED Final   Escherichia coli DETECTED (A) NOT DETECTED Final    Comment: CRITICAL RESULT CALLED TO, READ BACK BY AND VERIFIED WITH: NATHAN BELUE AT 0017 ON 06/02/22 BY SS    Klebsiella aerogenes NOT DETECTED NOT DETECTED Final   Klebsiella oxytoca NOT DETECTED NOT DETECTED Final   Klebsiella pneumoniae NOT DETECTED NOT DETECTED Final   Proteus species NOT DETECTED NOT DETECTED Final   Salmonella species NOT DETECTED NOT DETECTED Final   Serratia marcescens NOT DETECTED NOT DETECTED Final   Haemophilus influenzae NOT DETECTED NOT DETECTED Final   Neisseria meningitidis NOT DETECTED NOT DETECTED Final   Pseudomonas aeruginosa NOT DETECTED NOT DETECTED Final   Stenotrophomonas maltophilia NOT DETECTED NOT DETECTED Final   Candida albicans NOT DETECTED NOT DETECTED Final   Candida auris NOT DETECTED  NOT DETECTED Final   Candida glabrata NOT DETECTED NOT DETECTED Final   Candida krusei NOT DETECTED NOT DETECTED Final   Candida parapsilosis NOT DETECTED NOT DETECTED Final   Candida tropicalis NOT DETECTED NOT DETECTED Final   Cryptococcus neoformans/gattii NOT DETECTED NOT DETECTED Final   CTX-M ESBL DETECTED (A) NOT DETECTED Final    Comment: CRITICAL RESULT CALLED TO, READ BACK BY AND VERIFIED WITH: NATHAN BELUE AT 0017 ON 06/02/22 BY SS (NOTE) Extended spectrum beta-lactamase detected. Recommend a carbapenem as initial therapy.      Carbapenem resistance IMP NOT DETECTED NOT DETECTED Final   Carbapenem resistance KPC NOT DETECTED NOT DETECTED Final   Carbapenem resistance NDM NOT DETECTED NOT DETECTED Final   Carbapenem resist OXA 48 LIKE NOT DETECTED NOT DETECTED Final   Carbapenem resistance VIM NOT DETECTED NOT DETECTED Final    Comment: Performed at [facility], [address], [place], [place] [address]  Aerobic Culture w Gram Stain (superficial specimen)     Status: None (Preliminary result)   Collection Time: 06/01/22  4:35 PM   Specimen: Wound  Result Value Ref Range Status   Specimen Description   Final    WOUND Performed at [facility], [address] [place], [place] [address]    Special Requests   Final    RT KNEE Performed at [facility], [address] [place], [place] [address]    Gram Stain   Final    RARE WBC PRESENT, PREDOMINANTLY PMN RARE GRAM NEGATIVE RODS    Culture   Final    FEW GRAM NEGATIVE RODS CULTURE REINCUBATED FOR BETTER GROWTH Performed at [facility], [address] [place], [place] [address]    Report Status PENDING  Incomplete  MRSA Next Gen by PCR, Nasal     Status: None   Collection Time: 06/01/22  9:00 PM   Specimen: Nasal Mucosa; Nasal Swab  Result Value Ref Range Status   MRSA by PCR Next Gen NOT DETECTED NOT DETECTED Final    Comment: (NOTE) The GeneXpert MRSA Assay (FDA approved  for NASAL specimens only), is one component of a comprehensive MRSA colonization surveillance program. It is not intended to diagnose MRSA infection nor to guide or monitor treatment for MRSA infections. Test  performance is not FDA approved in patients less than 67 years old. Performed at [facility], [address] [place], [place] [address]     IMAGING RESULTS: I have personally reviewed the films ? Impression/Recommendation ?ESBL ecoli bacteremia- on meroepne Gram neg seen on the rt knee wound  Recent strap pyogenes infection of rt prepatellar bursa, than had MRSA infection and now gram neg rod  Encephalopathy from SUD  Decompensated cirrhosis with ascites Paracentesis done Cell count suggestive of Sbp  Spontaneous bacteria peritonitis- on meropenem Also on vanco  Anemia Thrombocytopenia Hyperbilirubinemia ? Crystal meth use Not sure whether he was taking the prescribed linezolid as OP- if so he is at risk for interaction ___________________________________________________ Discussed with his nurse Note:  This document was prepared using Dragon voice recognition software and may include unintentional dictation errors.

## 2022-06-02 NOTE — Consult Note (Addendum)
Inpatient Consultation  Referring Provider:     Dr Allena Katz Admit date: 06/01/22 Consult date        06/02/22 Reason for Consultation:     increasing bilirubin         HPI:   Marco Spears is a 46 y.o. 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  Patient had received Harvoni for his hep C and has had a DVT for which he has taken Eliquis. He presented to ED yesterday for AMS, found to have a sepsis from infected knee (hx septic prepatellar bursitis & I&D 7/23)), active polysubstance abuse and today blood cultures have prelim report of ESBL. He has been seen by orthopedics- appreciate note. Had paracentesis 7/19 & 28/23 and 05/26/22 Note admission total bili 5.1, ast 50, alt 38, alp 115, pt 17/8/INR 1.5. Albumin 2.4. Platelets 55, mild anemia and normal wbc Patient is awake this morning. States he has pain all over and some nausea but he is hungry. He denies other Gi related issues. We discussed his cirrhosis, however does not think this is a problem at this time. We discussed his current substance abuse which patient denies, and his sepsis which he seems to not comprehend. States he wants to leave. He is concerned about his homelessness and says this is why he can't be healthy. We discsussed how his current habits contribute to his pain and current condition/hospitalization.  PREVIOUS ENDOSCOPIES:            None [place] 04/20/22- mild ascites, cirrhotic liver without focal mass   Past Medical History:  Diagnosis Date   Cirrhosis of liver (HCC)    Heart murmur    Polysubstance abuse (HCC)    Septic bursitis     Past Surgical History:  Procedure Laterality Date   INCISION AND DRAINAGE Right 05/07/2022   Procedure: INCISION AND DRAINAGE;  Surgeon: Juanell Fairly, MD;  Location: [facility] OR[facility];  Service: Orthopedics;  Laterality: Right;   IRRIGATION AND DEBRIDEMENT KNEE Right 05/21/2022   Procedure:  IRRIGATION AND DEBRIDEMENT KNEE;  Surgeon: Juanell Fairly, MD;  Location: [facility] OR[facility];  Service: Orthopedics;  Laterality: Right;    History reviewed. No pertinent family history.   Social History   Tobacco Use   Smoking status: Some Days    Types: Cigarettes   Smokeless tobacco: Never  Substance Use Topics   Alcohol use: Never   Drug use: Yes    Types: Marijuana, Methamphetamines, IV    Prior to Admission medications   Medication Sig Start Date End Date Taking? Authorizing Provider  B Complex-C (B-COMPLEX WITH VITAMIN C) tablet Take 1 tablet by mouth daily.    [provider]  ferrous sulfate 325 (65 FE) MG tablet Take 1 tablet (325 mg total) by mouth daily with breakfast. 05/22/22   Tresa Moore, MD  folic acid (FOLVITE) 1 MG tablet Take 1 tablet (1 mg total) by mouth daily. 05/22/22   Tresa Moore, MD  furosemide (LASIX) 40 MG tablet Take 1 tablet (40 mg total) by mouth 2 (two) times daily. 05/22/22   Tresa Moore, MD  gabapentin (NEURONTIN) 400 MG capsule Take 1 capsule (400 mg total) by mouth 3 (three) times daily for 21 days. 05/26/22 06/16/22  Tresa Moore, MD  linezolid (ZYVOX) 600 MG tablet Take 1 tablet (600 mg total) by mouth 2 (two) times daily. 05/26/22   Tresa Moore, MD  Multiple Vitamin (MULTIVITAMIN WITH MINERALS) TABS tablet Take 1 tablet by mouth daily.    [provider]  nystatin (MYCOSTATIN) 100000 UNIT/ML suspension Take 5 mLs (500,000 Units total) by mouth 4 (four) times daily. 05/22/22   Tresa Moore, MD  spironolactone (ALDACTONE) 50 MG tablet Take 2 tablets (100 mg total) by mouth daily. 05/22/22   Tresa Moore, MD    Current Facility-Administered Medications  Medication Dose Route Frequency Provider Last Rate Last Admin   0.9 %  sodium chloride infusion   Intravenous Continuous Gertha Calkin, MD   Held at 06/01/22 1641   acetaminophen (TYLENOL) tablet 650 mg  650 mg Oral Q6H PRN Gertha Calkin, MD    650 mg at 06/02/22 4196   Or   acetaminophen (TYLENOL) suppository 650 mg  650 mg Rectal Q6H PRN Gertha Calkin, MD       albumin human 25 % solution 25 g  25 g Intravenous Once PRN Jaynie Collins, DO       Chlorhexidine Gluconate Cloth 2 % PADS 6 each  6 each Topical Q0600 Gertha Calkin, MD   6 each at 06/02/22 0503   dextrose 5 %-0.45 % sodium chloride infusion   Intravenous Continuous Gertha Calkin, MD 30 mL/hr at 06/01/22 2105 New Bag at 06/01/22 2105   lactated ringers bolus 500 mL  500 mL Intravenous Once Merwyn Katos, MD   Held at 06/01/22 1316   meropenem (MERREM) 1 g in sodium chloride 0.9 % 100 mL IVPB  1 g Intravenous Q8H BelueLendon Collar, RPH 200 mL/hr at 06/02/22 0902 1 g at 06/02/22 0902   morphine (PF) 2 MG/ML injection 2 mg  2 mg Intravenous Q4H PRN Gertha Calkin, MD       nicotine (NICODERM CQ - dosed in mg/24 hours) patch 21 mg  21 mg Transdermal Daily Gertha Calkin, MD   21 mg at 06/02/22 2229   Oral care mouth rinse  15 mL Mouth Rinse PRN Gertha Calkin, MD       sodium chloride flush (NS) 0.9 % injection 3 mL  3 mL Intravenous Q12H Irena Cords V, MD   3 mL at 06/02/22 0902   thiamine (VITAMIN B1) injection 100 mg  100 mg Intravenous Daily Gertha Calkin, MD        Allergies as of 06/01/2022 - Review Complete 06/01/2022  Allergen Reaction Noted   Amoxicillin Rash 04/19/2022     Review of Systems:    All systems reviewed and negative except where noted in HPI.      Physical Exam:  Vital signs in last 24 hours: Temp:  [98.2 F (36.8 C)-101.2 F (38.4 C)] 98.2 F (36.8 C) (08/08 0700) Pulse Rate:  [102-118] 104 (08/08 0700) Resp:  [7-29] 23 (08/08 0700) BP: (103-141)/(56-94) 104/66 (08/08 0700) SpO2:  [92 %-99 %] 93 % (08/08 0700) Weight:  [107.7 kg-110 kg] 107.7 kg (08/07 2050) Last BM Date : 06/01/22 General:   Middle aged man in NAD Head:  Normocephalic and atraumatic. Eyes:   Icterus.   Conjunctiva pink. Ears:  Normal auditory acuity. Mouth:  Mucosa pink moist, no lesions. Neck:  Supple; no masses felt Lungs:  Respirations even and unlabored. Lungs clear to auscultation bilaterally.   No wheezes, crackles, or rhonchi.  Heart:  S1S2, RRR, no MRG. No edema. Abdomen:   Flat, soft, nondistended, mild diffuse tenderness. Normal bowel sounds. No appreciable masses or hepatomegaly. No rebound signs  or other peritoneal signs. Rectal:  Not performed.  Msk:  MAEW x4, No clubbing or cyanosis. Strength 5/5. Symmetrical without gross deformities. Neurologic:  Alert and  oriented x4;  Cranial nerves II-XII intact.  Skin:  Mutliple tattoos. Warm, dry, pink without significant lesions or rashes. Psych:  Alert and cooperative. Normal affect.Rather limited judgement and insight  LAB RESULTS: Recent Labs    06/01/22 1153 06/02/22 0202 06/02/22 0410  WBC 6.2  --  9.8  HGB 10.7* 9.0* 8.8*  HCT 31.6* 26.1* 25.3*  PLT 55*  --  35*   BMET Recent Labs    06/01/22 1153 06/02/22 0202 06/02/22 0410  NA 131* 132* 130*  K 3.4* 3.6 3.5  CL 100 103 102  CO2 24 24 24   GLUCOSE 123* 109* 107*  BUN 22* 21* 22*  CREATININE 0.95 0.70 0.80  CALCIUM 8.2* 7.8* 7.7*   LFT Recent Labs    06/02/22 0202 06/02/22 0410  PROT  --  4.9*  ALBUMIN  --  2.0*  AST  --  37  ALT  --  31  ALKPHOS  --  81  BILITOT 5.6* 5.4*  BILIDIR 1.5*  --   IBILI 4.1*  --    PT/INR Recent Labs    06/01/22 1153  LABPROT 17.8*  INR 1.5*    STUDIES: MR KNEE RIGHT WO CONTRAST  Result Date: 06/01/2022 CLINICAL DATA:  Osteomyelitis suspected, knee, xray done EXAM: MRI OF THE RIGHT KNEE WITHOUT CONTRAST TECHNIQUE: Multiplanar, multisequence MR imaging of the knee was performed. No intravenous contrast was administered. COMPARISON:  Right knee MRI 05/21/2022 FINDINGS: MENISCI Medial: Undersurface tear of the posterior horn and body of the medial meniscus, with mild protrusion of meniscus tissue into the medial inferior gutter, similar to prior. Lateral: Intact lateral  meniscus. LIGAMENTS Cruciates: ACL and PCL are intact. Collaterals: Medial collateral ligament is intact. Lateral collateral ligament complex is intact. CARTILAGE Patellofemoral:  Mild chondrosis.  No focal chondral defect. Medial:  Mild chondrosis.  No focal chondral defect Lateral:  Mild chondrosis.  No focal chondral defect. JOINT: Small joint effusion. POPLITEAL FOSSA: MinisculeBaker's cyst. EXTENSOR MECHANISM: Intact quadriceps tendon. Intact patellar tendon. BONES: No evidence of acute fracture. Decreased marrow edema within the patella. Preserved T1 marrow signal throughout. Other: Diffuse, circumferential skin thickening and soft tissue swelling along the knee. There is a subcutaneous fluid collection in the prepatellar soft tissues, measuring 4.3 x 1.0 x 3.4 cm (volume = 7.7 cm^3), previously measuring 4.0 x 1.0 x 3.7 cm (volume = 7.7 cm^3), not significantly changed. There appears to be a communication with the skin surface anteriorly (axial PD image 21). IMPRESSION: Diffuse skin thickening and soft tissue swelling along the knee, similar prior, compatible with cellulitis. No significant change in size of the prepatellar soft tissue fluid collection, measuring 4.3 x 1.0 x 3.4 cm, with communication with the skin surface anteriorly, correlate with drainage. Unchanged small joint effusion, likely reactive. Decreased marrow edema in the patella with preserved T1 marrow signal, suggesting resolving contusion or reactive marrow change. No evidence of osteomyelitis or convincing findings to suggest septic arthritis. Unchanged mild tricompartment osteoarthritis and undersurface tear of the medial meniscus. Electronically Signed   By: 05/23/2022 M.D.   On: 06/01/2022 15:17   CT Head Wo Contrast  Result Date: 06/01/2022 CLINICAL DATA:  Mental status change, unknown cause EXAM: CT HEAD WITHOUT CONTRAST TECHNIQUE: Contiguous axial images were obtained from the base of the skull through the vertex without  intravenous contrast.  RADIATION DOSE REDUCTION: This exam was performed according to the departmental dose-optimization program which includes automated exposure control, adjustment of the mA and/or kV according to patient size and/or use of iterative reconstruction technique. COMPARISON:  04/20/2022 FINDINGS: Brain: There is no acute intracranial hemorrhage, mass effect, or edema. No acute appearing loss of gray-white differentiation. Left anteroinferior frontal encephalomalacia and anterior left temporal encephalomalacia again noted. There is no extra-axial fluid collection. Ventricles and sulci are within normal limits in size and configuration. Vascular: No hyperdense vessel or unexpected calcification. Skull: Calvarium is unremarkable. Sinuses/Orbits: No acute finding. Other: None. IMPRESSION: No acute intracranial abnormality. Stable chronic findings detailed above. Electronically Signed   By: Guadlupe Spanish M.D.   On: 06/01/2022 13:57   DG Chest Port 1 View  Result Date: 06/01/2022 CLINICAL DATA:  Questionable sepsis. EXAM: PORTABLE CHEST 1 VIEW COMPARISON:  None Available. FINDINGS: Trachea is midline. Heart size is accentuated by AP technique and low lung volumes. Mild central pulmonary vascular congestion. No airspace consolidation or pleural fluid. IMPRESSION: Central pulmonary vascular congestion may be due in part to low lung volumes. Difficult to exclude impending edema. Electronically Signed   By: Leanna Battles M.D.   On: 06/01/2022 13:12       Impression / Plan:   Increasing bilirubin in setting of known liver cirrhosis with active substance abuse and sepsis- in review over the last 67m, patient's bilirubin has ranged from 3.2-5.6 so his current value is within his range and very much likely related to his impaired hepatic function. Would observe and manage for any HE relating to his known infection and cirrhosis. Would assess meds for hepatotoxicity and limit as much as possible.  Needs  adequate nutrition and protein intake. Consider psych consult to aid with recovery from substance abuse and social service consult to help with living situation.  Will assess ammonia- patient may benefit from lacutlose/xifaxan. Also is patient needs paracentesis during this visit would recommend cell count/cytology/aerobic & anaerobic cultures, albumin, gram stain. Should have daily pt/inr and liver panel. Will screen for any new hcv infection as well has HBV due to his risk factors. Addendum- 2. Nausea- multifactorial- would also consider daily ppi for his acute ilness/nausea as well as prn ondansetron 4mg  qid prn  Thank you very much for this consult. These services were provided by , NP-C, in collaboration with Vevelyn Pat DO, with whom I have discussed this patient in full.   Elfredia Nevins, NP-C

## 2022-06-02 NOTE — Progress Notes (Signed)
       CROSS COVER NOTE  NAME: Marco Spears MRN: [id number] DOB : 1976-05-07    Date of Service   06/02/2022  HPI/Events of Note   Contacted by pharmacy after BCID positive for ESBL E.coli  Interventions   Plan: Meropenem per pharmacy      This document was prepared using Dragon voice recognition software and may include unintentional dictation errors.  Bishop Limbo DNP, MHA, FNP-BC Nurse Practitioner [facility] Pager [phone]

## 2022-06-02 NOTE — Progress Notes (Signed)
PHARMACY - PHYSICIAN COMMUNICATION CRITICAL VALUE ALERT - BLOOD CULTURE IDENTIFICATION (BCID)  Results for orders placed or performed during the hospital encounter of 06/01/22  Resp Panel by RT-PCR (Flu A&B, Covid) Anterior Nasal Swab     Status: None   Collection Time: 06/01/22 12:34 PM   Specimen: Anterior Nasal Swab  Result Value Ref Range Status   SARS Coronavirus 2 by RT PCR NEGATIVE NEGATIVE Final    Comment: (NOTE) SARS-CoV-2 target nucleic acids are NOT DETECTED.  The SARS-CoV-2 RNA is generally detectable in upper respiratory specimens during the acute phase of infection. The lowest concentration of SARS-CoV-2 viral copies this assay can detect is 138 copies/mL. A negative result does not preclude SARS-Cov-2 infection and should not be used as the sole basis for treatment or other patient management decisions. A negative result may occur with  improper specimen collection/handling, submission of specimen other than nasopharyngeal swab, presence of viral mutation(s) within the areas targeted by this assay, and inadequate number of viral copies(<138 copies/mL). A negative result must be combined with clinical observations, patient history, and epidemiological information. The expected result is Negative.  Fact Sheet for Patients:  BloggerCourse.com  Fact Sheet for Healthcare Providers:  SeriousBroker.it  This test is no t yet approved or cleared by the [place] FDA and  has been authorized for detection and/or diagnosis of SARS-CoV-2 by FDA under an Emergency Use Authorization (EUA). This EUA will remain  in effect (meaning this test can be used) for the duration of the COVID-19 declaration under Section 564(b)(1) of the Act, 21 U.S.C.section 360bbb-3(b)(1), unless the authorization is terminated  or revoked sooner.       Influenza A by PCR NEGATIVE NEGATIVE Final   Influenza B by PCR NEGATIVE NEGATIVE Final     Comment: (NOTE) The Xpert Xpress SARS-CoV-2/FLU/RSV plus assay is intended as an aid in the diagnosis of influenza from Nasopharyngeal swab specimens and should not be used as a sole basis for treatment. Nasal washings and aspirates are unacceptable for Xpert Xpress SARS-CoV-2/FLU/RSV testing.  Fact Sheet for Patients: BloggerCourse.com  Fact Sheet for Healthcare Providers: SeriousBroker.it  This test is not yet approved or cleared by the [place] FDA and has been authorized for detection and/or diagnosis of SARS-CoV-2 by FDA under an Emergency Use Authorization (EUA). This EUA will remain in effect (meaning this test can be used) for the duration of the COVID-19 declaration under Section 564(b)(1) of the Act, 21 U.S.C. section 360bbb-3(b)(1), unless the authorization is terminated or revoked.  Performed at [facility], [address] [place], [place] [address]   Blood Culture (routine x 2)     Status: None (Preliminary result)   Collection Time: 06/01/22 12:34 PM   Specimen: BLOOD  Result Value Ref Range Status   Specimen Description BLOOD Blood Culture adequate volume  Final   Special Requests   Final    BOTTLES DRAWN AEROBIC AND ANAEROBIC LEFT ANTECUBITAL   Culture  Setup Time   Final    Organism ID to follow GRAM NEGATIVE RODS ANAEROBIC BOTTLE ONLY CRITICAL RESULT CALLED TO, READ BACK BY AND VERIFIED WITH: Notnamed Scholz AT 0017 ON 06/02/22 BY SS Performed at [facility], [address], [place], [place] [address]    Culture GRAM NEGATIVE RODS  Final   Report Status PENDING  Incomplete  Blood Culture ID Panel (Reflexed)     Status: Abnormal   Collection Time: 06/01/22 12:34 PM  Result Value Ref Range Status   Enterococcus  faecalis NOT DETECTED NOT DETECTED Final   Enterococcus Faecium NOT DETECTED NOT DETECTED Final   Listeria monocytogenes NOT DETECTED NOT DETECTED Final   Staphylococcus  species NOT DETECTED NOT DETECTED Final   Staphylococcus aureus (BCID) NOT DETECTED NOT DETECTED Final   Staphylococcus epidermidis NOT DETECTED NOT DETECTED Final   Staphylococcus lugdunensis NOT DETECTED NOT DETECTED Final   Streptococcus species NOT DETECTED NOT DETECTED Final   Streptococcus agalactiae NOT DETECTED NOT DETECTED Final   Streptococcus pneumoniae NOT DETECTED NOT DETECTED Final   Streptococcus pyogenes NOT DETECTED NOT DETECTED Final   A.calcoaceticus-baumannii NOT DETECTED NOT DETECTED Final   Bacteroides fragilis NOT DETECTED NOT DETECTED Final   Enterobacterales DETECTED (A) NOT DETECTED Final    Comment: Enterobacterales represent a large order of gram negative bacteria, not a single organism. CRITICAL RESULT CALLED TO, READ BACK BY AND VERIFIED WITH: Dash Cardarelli AT 0017 ON 06/02/22 BY SS    Enterobacter cloacae complex NOT DETECTED NOT DETECTED Final   Escherichia coli DETECTED (A) NOT DETECTED Final    Comment: CRITICAL RESULT CALLED TO, READ BACK BY AND VERIFIED WITH: Jontae Sonier AT 0017 ON 06/02/22 BY SS    Klebsiella aerogenes NOT DETECTED NOT DETECTED Final   Klebsiella oxytoca NOT DETECTED NOT DETECTED Final   Klebsiella pneumoniae NOT DETECTED NOT DETECTED Final   Proteus species NOT DETECTED NOT DETECTED Final   Salmonella species NOT DETECTED NOT DETECTED Final   Serratia marcescens NOT DETECTED NOT DETECTED Final   Haemophilus influenzae NOT DETECTED NOT DETECTED Final   Neisseria meningitidis NOT DETECTED NOT DETECTED Final   Pseudomonas aeruginosa NOT DETECTED NOT DETECTED Final   Stenotrophomonas maltophilia NOT DETECTED NOT DETECTED Final   Candida albicans NOT DETECTED NOT DETECTED Final   Candida auris NOT DETECTED NOT DETECTED Final   Candida glabrata NOT DETECTED NOT DETECTED Final   Candida krusei NOT DETECTED NOT DETECTED Final   Candida parapsilosis NOT DETECTED NOT DETECTED Final   Candida tropicalis NOT DETECTED NOT DETECTED Final    Cryptococcus neoformans/gattii NOT DETECTED NOT DETECTED Final   CTX-M ESBL DETECTED (A) NOT DETECTED Final    Comment: CRITICAL RESULT CALLED TO, READ BACK BY AND VERIFIED WITH: Json Koelzer AT 0017 ON 06/02/22 BY SS (NOTE) Extended spectrum beta-lactamase detected. Recommend a carbapenem as initial therapy.      Carbapenem resistance IMP NOT DETECTED NOT DETECTED Final   Carbapenem resistance KPC NOT DETECTED NOT DETECTED Final   Carbapenem resistance NDM NOT DETECTED NOT DETECTED Final   Carbapenem resist OXA 48 LIKE NOT DETECTED NOT DETECTED Final   Carbapenem resistance VIM NOT DETECTED NOT DETECTED Final    Comment: Performed at [facility], [address] [place], [place] [address]  Aerobic Culture w Gram Stain (superficial specimen)     Status: None (Preliminary result)   Collection Time: 06/01/22  4:35 PM   Specimen: Wound  Result Value Ref Range Status   Specimen Description   Final    WOUND Performed at [facility], [address] [place], [place] [address]    Special Requests   Final    RT KNEE Performed at [facility], [address] [place], [place] [address]    Gram Stain   Final    RARE WBC PRESENT, PREDOMINANTLY PMN RARE GRAM NEGATIVE RODS Performed at [facility], [place] [address], [place], [place] [address]    Culture PENDING  Incomplete   Report Status PENDING  Incomplete  MRSA Next  Gen by PCR, Nasal     Status: None   Collection Time: 06/01/22  9:00 PM   Specimen: Nasal Mucosa; Nasal Swab  Result Value Ref Range Status   MRSA by PCR Next Gen NOT DETECTED NOT DETECTED Final    Comment: (NOTE) The GeneXpert MRSA Assay (FDA approved for NASAL specimens only), is one component of a comprehensive MRSA colonization surveillance program. It is not intended to diagnose MRSA infection nor to guide or monitor treatment for MRSA infections. Test performance is not FDA approved in patients less than 22 years old. Performed  at [facility], [address] [place], [place] [address]    Initial BCID:  1 (anaerobic) of 4 with E coli w/ CTX-M ESBL detected.  Pt given Ceftriaxone & Vancomycin x 1 dose in ED, but not currently ordered any abx.  Pt currently afebrile w/ WBC of 6.2 but does have recent hx of MRSA (8/7 PCR negative) and noted to have swelling & serous drainage from R knee incision.   Name of provider contacted: K.Foust, NP   Changes to prescribed antibiotics required: Start Meropenem per pharmacy consult.  Otelia Sergeant, PharmD, [facility] 06/02/2022 12:42 AM

## 2022-06-02 NOTE — Progress Notes (Signed)
PROGRESS NOTE    Marco Spears  ZOX:[id number] DOB: 1976/03/03 DOA: 06/01/2022 PCP: Oneita Hurt, No    Brief Narrative:  Marco Spears is a 46 y.o. male seen in ed brought by EMS for altered mental status where patient was picked up from a home where he had been on meth.  Patient's right knee incision had clear drainage per report patient was intermittently arousable but restless agitated and almost paranoid.  Patient reports pain over the right knee.  Patient gets startled with sound. Patient is altered and somnolent and not oriented and responds to when called but falls asleep immediately.   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  Patient had received Harvoni for his hep C and has had a DVT for which she has taken Eliquis.  Patient has past medical history of allergies to amoxicillin. Patient had diarrhea right knee recurrent septic prepatellar bursitis and has had I&D on 05/07/2022.  Patient had paracentesis on 05/22/2022 with 3.6 L of hazy yellow fluid.  Last paracentesis was on 05/26/2022 where he had 4.8 L removed. Patient is a large hematoma about 12 cm on his lower abdominal area more towards suprapubic area.  8/8 c/o being hungry. Explained to him he has procedures he needs to get done this am so needs to be npo.  Paracentesis this a.m.  ID consulted  Consultants:  GI  Procedures:   Antimicrobials:  Meropenem   Subjective: Sleepy, drawsy when talks. Denies abd pain, n/v, or knee pain  Objective: Vitals:   06/02/22 0800 06/02/22 0900 06/02/22 1000 06/02/22 1100  BP: 108/69 116/68 113/71 118/71  Pulse: (!) 106 100 100 99  Resp: (!) (!) 21  Temp:      TempSrc:      SpO2: 90% 94% 96% 94%  Weight:      Height:       No intake or output data in the 24 hours ending 06/02/22 1426 Filed Weights   06/01/22 1148 06/01/22 2050  Weight: 110 kg 107.7 kg    Examination: Calm,  NAD Cta no w/r Reg s1/s2 no gallop Soft benign +bs +LE 2-3 edema.Rt knee mildly with swelling, ward to touch, no drainage from wound sutures.  Drawsy, answers questions. Grossly intact aaxoxo4 Mood and affect appropriate in current setting    Data Reviewed: I have personally reviewed following labs and imaging studies  CBC: Recent Labs  Lab 06/01/22 1153 06/02/22 0202 06/02/22 0410  WBC 6.2  --  9.8  NEUTROABS 5.5  --   --   HGB 10.7* 9.0* 8.8*  HCT 31.6* 26.1* 25.3*  MCV 98.8  --  98.4  PLT 55*  --  35*   Basic Metabolic Panel: Recent Labs  Lab 06/01/22 1153 06/02/22 0202 06/02/22 0410  NA 131* 132* 130*  K 3.4* 3.6 3.5  CL 100 103 102  CO2 GLUCOSE 123* 109* 107*  BUN 22* 21* 22*  CREATININE 0.95 0.70 0.80  CALCIUM 8.2* 7.8* 7.7*  MG  --  1.7  --    GFR: Estimated Creatinine Clearance: 149.8 mL/min (by C-G formula based on SCr of 0.8 mg/dL). Liver Function Tests: Recent Labs  Lab 06/01/22 1153 06/02/22 0202 06/02/22 0410  AST 50*  --  37  ALT 38  --  31  ALKPHOS 115  --  81  BILITOT 5.1* 5.6* 5.4*  PROT 6.0*  --  4.9*  ALBUMIN 2.4*  --  2.0*   No results for input(s): "LIPASE", "AMYLASE" in the last 168 hours. Recent Labs  Lab 06/01/22 1153 06/02/22 1058  AMMONIA 33 36*   Coagulation Profile: Recent Labs  Lab 06/01/22 1153  INR 1.5*   Cardiac Enzymes: No results for input(s): "CKTOTAL", "CKMB", "CKMBINDEX", "TROPONINI" in the last 168 hours. BNP (last 3 results) No results for input(s): "PROBNP" in the last 8760 hours. HbA1C: No results for input(s): "HGBA1C" in the last 72 hours. CBG: No results for input(s): "GLUCAP" in the last 168 hours. Lipid Profile: No results for input(s): "CHOL", "HDL", "LDLCALC", "TRIG", "CHOLHDL", "LDLDIRECT" in the last 72 hours. Thyroid Function Tests: Recent Labs    06/01/22 1700  TSH 1.859  FREET4 1.12   Anemia Panel: No results for input(s): "VITAMINB12", "FOLATE", "FERRITIN", "TIBC",  "IRON", "RETICCTPCT" in the last 72 hours. Sepsis Labs: Recent Labs  Lab 06/01/22 1153 06/01/22 1405 06/02/22 0202 06/02/22 0410  PROCALCITON <0.10  --   --  2.16  LATICACIDVEN 2.7* 2.0* 1.7 1.6    Recent Results (from the past 240 hour(s))  Resp Panel by RT-PCR (Flu A&B, Covid) Anterior Nasal Swab     Status: None   Collection Time: 06/01/22 12:34 PM   Specimen: Anterior Nasal Swab  Result Value Ref Range Status   SARS Coronavirus 2 by RT PCR NEGATIVE NEGATIVE Final    Comment: (NOTE) SARS-CoV-2 target nucleic acids are NOT DETECTED.  The SARS-CoV-2 RNA is generally detectable in upper respiratory specimens during the acute phase of infection. The lowest concentration of SARS-CoV-2 viral copies this assay can detect is 138 copies/mL. A negative result does not preclude SARS-Cov-2 infection and should not be used as the sole basis for treatment or other patient management decisions. A negative result may occur with  improper specimen collection/handling, submission of specimen other than nasopharyngeal swab, presence of viral mutation(s) within the areas targeted by this assay, and inadequate number of viral copies(<138 copies/mL). A negative result must be combined with clinical observations, patient history, and epidemiological information. The expected result is Negative.  Fact Sheet for Patients:  BloggerCourse.com  Fact Sheet for Healthcare Providers:  SeriousBroker.it  This test is no t yet approved or cleared by the [place] FDA and  has been authorized for detection and/or diagnosis of SARS-CoV-2 by FDA under an Emergency Use Authorization (EUA). This EUA will remain  in effect (meaning this test can be used) for the duration of the COVID-19 declaration under Section 564(b)(1) of the Act, 21 U.S.C.section 360bbb-3(b)(1), unless the authorization is terminated  or revoked sooner.       Influenza A by PCR  NEGATIVE NEGATIVE Final   Influenza B by PCR NEGATIVE NEGATIVE Final    Comment: (NOTE) The Xpert Xpress SARS-CoV-2/FLU/RSV plus assay is intended as an aid in the diagnosis of influenza from Nasopharyngeal swab specimens and should not be used as a sole basis for treatment. Nasal washings and aspirates are unacceptable for Xpert Xpress SARS-CoV-2/FLU/RSV testing.  Fact Sheet for Patients: BloggerCourse.com  Fact Sheet for Healthcare Providers: SeriousBroker.it  This test is not yet approved or cleared by the [place] FDA and has been authorized for detection and/or diagnosis of SARS-CoV-2 by FDA under an Emergency Use Authorization (EUA). This EUA will remain in effect (meaning this test can be used) for the duration of the COVID-19 declaration under Section 564(b)(1) of the Act, 21 U.S.C. section 360bbb-3(b)(1), unless the authorization is terminated or revoked.  Performed  at [facility], [address] [place], [place] [address]   Blood Culture (routine x 2)     Status: None (Preliminary result)   Collection Time: 06/01/22 12:34 PM   Specimen: BLOOD  Result Value Ref Range Status   Specimen Description   Final    BLOOD Blood Culture adequate volume Performed at [facility], [address], [place], [place] [address]    Special Requests   Final    BOTTLES DRAWN AEROBIC AND ANAEROBIC LEFT ANTECUBITAL Performed at [facility], [address] [place], [place] [address]    Culture  Setup Time   Final    Organism ID to follow GRAM NEGATIVE RODS ANAEROBIC BOTTLE ONLY CRITICAL RESULT CALLED TO, READ BACK BY AND VERIFIED WITH: NATHAN BELUE AT 0017 ON 06/02/22 BY SS    Culture GRAM NEGATIVE RODS  Final   Report Status PENDING  Incomplete  Blood Culture (routine x 2)     Status: None (Preliminary result)   Collection Time: 06/01/22 12:34 PM   Specimen: BLOOD  Result Value Ref Range Status    Specimen Description   Final    BLOOD Blood Culture results may not be optimal due to an inadequate volume of blood received in culture bottles   Special Requests BOTTLES DRAWN AEROBIC AND ANAEROBIC WRIST  Final   Culture   Final    NO GROWTH < 24 HOURS Performed at [facility], [address] [place], [place] [address]    Report Status PENDING  Incomplete  Blood Culture ID Panel (Reflexed)     Status: Abnormal   Collection Time: 06/01/22 12:34 PM  Result Value Ref Range Status   Enterococcus faecalis NOT DETECTED NOT DETECTED Final   Enterococcus Faecium NOT DETECTED NOT DETECTED Final   Listeria monocytogenes NOT DETECTED NOT DETECTED Final   Staphylococcus species NOT DETECTED NOT DETECTED Final   Staphylococcus aureus (BCID) NOT DETECTED NOT DETECTED Final   Staphylococcus epidermidis NOT DETECTED NOT DETECTED Final   Staphylococcus lugdunensis NOT DETECTED NOT DETECTED Final   Streptococcus species NOT DETECTED NOT DETECTED Final   Streptococcus agalactiae NOT DETECTED NOT DETECTED Final   Streptococcus pneumoniae NOT DETECTED NOT DETECTED Final   Streptococcus pyogenes NOT DETECTED NOT DETECTED Final   A.calcoaceticus-baumannii NOT DETECTED NOT DETECTED Final   Bacteroides fragilis NOT DETECTED NOT DETECTED Final   Enterobacterales DETECTED (A) NOT DETECTED Final    Comment: Enterobacterales represent a large order of gram negative bacteria, not a single organism. CRITICAL RESULT CALLED TO, READ BACK BY AND VERIFIED WITH: NATHAN BELUE AT 0017 ON 06/02/22 BY SS    Enterobacter cloacae complex NOT DETECTED NOT DETECTED Final   Escherichia coli DETECTED (A) NOT DETECTED Final    Comment: CRITICAL RESULT CALLED TO, READ BACK BY AND VERIFIED WITH: NATHAN BELUE AT 0017 ON 06/02/22 BY SS    Klebsiella aerogenes NOT DETECTED NOT DETECTED Final   Klebsiella oxytoca NOT DETECTED NOT DETECTED Final   Klebsiella pneumoniae NOT DETECTED NOT DETECTED Final   Proteus species  NOT DETECTED NOT DETECTED Final   Salmonella species NOT DETECTED NOT DETECTED Final   Serratia marcescens NOT DETECTED NOT DETECTED Final   Haemophilus influenzae NOT DETECTED NOT DETECTED Final   Neisseria meningitidis NOT DETECTED NOT DETECTED Final   Pseudomonas aeruginosa NOT DETECTED NOT DETECTED Final   Stenotrophomonas maltophilia NOT DETECTED NOT DETECTED Final   Candida albicans NOT DETECTED NOT DETECTED Final   Candida auris NOT DETECTED NOT DETECTED  Final   Candida glabrata NOT DETECTED NOT DETECTED Final   Candida krusei NOT DETECTED NOT DETECTED Final   Candida parapsilosis NOT DETECTED NOT DETECTED Final   Candida tropicalis NOT DETECTED NOT DETECTED Final   Cryptococcus neoformans/gattii NOT DETECTED NOT DETECTED Final   CTX-M ESBL DETECTED (A) NOT DETECTED Final    Comment: CRITICAL RESULT CALLED TO, READ BACK BY AND VERIFIED WITH: NATHAN BELUE AT 0017 ON 06/02/22 BY SS (NOTE) Extended spectrum beta-lactamase detected. Recommend a carbapenem as initial therapy.      Carbapenem resistance IMP NOT DETECTED NOT DETECTED Final   Carbapenem resistance KPC NOT DETECTED NOT DETECTED Final   Carbapenem resistance NDM NOT DETECTED NOT DETECTED Final   Carbapenem resist OXA 48 LIKE NOT DETECTED NOT DETECTED Final   Carbapenem resistance VIM NOT DETECTED NOT DETECTED Final    Comment: Performed at [facility], [address] [place], [place] [address]  Urine Culture     Status: None   Collection Time: 06/01/22  1:06 PM   Specimen: In/Out Cath Urine  Result Value Ref Range Status   Specimen Description   Final    IN/OUT CATH URINE Performed at [facility], [address] [place], [place] [address]    Special Requests   Final    NONE Performed at [facility], [address] [place], [place] [address]    Culture   Final    NO GROWTH Performed at [facility], [address] [place], [place] [address]    Report Status 06/02/2022  FINAL  Final  Aerobic Culture w Gram Stain (superficial specimen)     Status: None (Preliminary result)   Collection Time: 06/01/22  4:35 PM   Specimen: Wound  Result Value Ref Range Status   Specimen Description   Final    WOUND Performed at [facility], [address] [place], [place] [address]    Special Requests   Final    RT KNEE Performed at [facility], [address] [place], [place] [address]    Gram Stain   Final    RARE WBC PRESENT, PREDOMINANTLY PMN RARE GRAM NEGATIVE RODS    Culture   Final    FEW KLEBSIELLA OXYTOCA CULTURE REINCUBATED FOR BETTER GROWTH Performed at [facility], [address] [place], [place] [address]    Report Status PENDING  Incomplete  MRSA Next Gen by PCR, Nasal     Status: None   Collection Time: 06/01/22  9:00 PM   Specimen: Nasal Mucosa; Nasal Swab  Result Value Ref Range Status   MRSA by PCR Next Gen NOT DETECTED NOT DETECTED Final    Comment: (NOTE) The GeneXpert MRSA Assay (FDA approved for NASAL specimens only), is one component of a comprehensive MRSA colonization surveillance program. It is not intended to diagnose MRSA infection nor to guide or monitor treatment for MRSA infections. Test performance is not FDA approved in patients less than 48 years old. Performed at [facility], [address] [place], [place] [address]          Radiology Studies: US Abdomen Complete  Result Date: 06/02/2022 CLINICAL DATA:  Cirrhosis, abdominal pain EXAM: ABDOMEN ULTRASOUND COMPLETE COMPARISON:  None Available. FINDINGS: Gallbladder: Sludge is present. No gallstones or wall thickening visualized. No sonographic Murphy sign noted by sonographer. Common bile duct: Diameter: 3 mm, normal Liver: No focal lesion identified. Increased parenchymal echogenicity. Nodular contour. Portal vein is patent on color Doppler  imaging with normal direction of blood flow towards the liver. IVC: No abnormality  visualized. Pancreas: Not visualized. Spleen: Enlarged. Right Kidney: Length: 12.9 cm. Echogenicity within normal limits. No mass or hydronephrosis visualized. Left Kidney: Length: 12.0 cm. Echogenicity within normal limits. No mass or hydronephrosis visualized. Abdominal aorta: No aneurysm visualized. Other findings: Moderate ascites. IMPRESSION: Cirrhosis with splenomegaly and moderate ascites. Gallbladder sludge. Electronically Signed   By: Guadlupe Spanish M.D.   On: 06/02/2022 09:29   MR KNEE RIGHT WO CONTRAST  Result Date: 06/01/2022 CLINICAL DATA:  Osteomyelitis suspected, knee, xray done EXAM: MRI OF THE RIGHT KNEE WITHOUT CONTRAST TECHNIQUE: Multiplanar, multisequence MR imaging of the knee was performed. No intravenous contrast was administered. COMPARISON:  Right knee MRI 05/21/2022 FINDINGS: MENISCI Medial: Undersurface tear of the posterior horn and body of the medial meniscus, with mild protrusion of meniscus tissue into the medial inferior gutter, similar to prior. Lateral: Intact lateral meniscus. LIGAMENTS Cruciates: ACL and PCL are intact. Collaterals: Medial collateral ligament is intact. Lateral collateral ligament complex is intact. CARTILAGE Patellofemoral:  Mild chondrosis.  No focal chondral defect. Medial:  Mild chondrosis.  No focal chondral defect Lateral:  Mild chondrosis.  No focal chondral defect. JOINT: Small joint effusion. POPLITEAL FOSSA: MinisculeBaker's cyst. EXTENSOR MECHANISM: Intact quadriceps tendon. Intact patellar tendon. BONES: No evidence of acute fracture. Decreased marrow edema within the patella. Preserved T1 marrow signal throughout. Other: Diffuse, circumferential skin thickening and soft tissue swelling along the knee. There is a subcutaneous fluid collection in the prepatellar soft tissues, measuring 4.3 x 1.0 x 3.4 cm (volume = 7.7 cm^3), previously measuring 4.0 x 1.0 x 3.7 cm (volume = 7.7 cm^3), not significantly changed. There appears to be a communication  with the skin surface anteriorly (axial PD image 21). IMPRESSION: Diffuse skin thickening and soft tissue swelling along the knee, similar prior, compatible with cellulitis. No significant change in size of the prepatellar soft tissue fluid collection, measuring 4.3 x 1.0 x 3.4 cm, with communication with the skin surface anteriorly, correlate with drainage. Unchanged small joint effusion, likely reactive. Decreased marrow edema in the patella with preserved T1 marrow signal, suggesting resolving contusion or reactive marrow change. No evidence of osteomyelitis or convincing findings to suggest septic arthritis. Unchanged mild tricompartment osteoarthritis and undersurface tear of the medial meniscus. Electronically Signed   By: Caprice Renshaw M.D.   On: 06/01/2022 15:17   CT Head Wo Contrast  Result Date: 06/01/2022 CLINICAL DATA:  Mental status change, unknown cause EXAM: CT HEAD WITHOUT CONTRAST TECHNIQUE: Contiguous axial images were obtained from the base of the skull through the vertex without intravenous contrast. RADIATION DOSE REDUCTION: This exam was performed according to the departmental dose-optimization program which includes automated exposure control, adjustment of the mA and/or kV according to patient size and/or use of iterative reconstruction technique. COMPARISON:  04/20/2022 FINDINGS: Brain: There is no acute intracranial hemorrhage, mass effect, or edema. No acute appearing loss of gray-white differentiation. Left anteroinferior frontal encephalomalacia and anterior left temporal encephalomalacia again noted. There is no extra-axial fluid collection. Ventricles and sulci are within normal limits in size and configuration. Vascular: No hyperdense vessel or unexpected calcification. Skull: Calvarium is unremarkable. Sinuses/Orbits: No acute finding. Other: None. IMPRESSION: No acute intracranial abnormality. Stable chronic findings detailed above. Electronically Signed   By: Guadlupe Spanish M.D.    On: 06/01/2022 13:57   DG Chest Port 1 View  Result Date: 06/01/2022 CLINICAL DATA:  Questionable sepsis. EXAM: PORTABLE CHEST 1 VIEW COMPARISON:  None Available. FINDINGS: Trachea is midline. Heart size is accentuated by AP technique and low lung volumes. Mild central pulmonary vascular congestion. No airspace consolidation or pleural fluid. IMPRESSION: Central pulmonary vascular congestion may be due in part to low lung volumes. Difficult to exclude impending edema. Electronically Signed   By: Leanna Battles M.D.   On: 06/01/2022 13:12        Scheduled Meds:  Chlorhexidine Gluconate Cloth  6 each Topical Q0600   nicotine  21 mg Transdermal Daily   sodium chloride flush  3 mL Intravenous Q12H   thiamine (VITAMIN B1) injection  100 mg Intravenous Daily   Continuous Infusions:  sodium chloride Stopped (06/01/22 1641)   albumin human     dextrose 5 % and 0.45% NaCl 30 mL/hr at 06/01/22 2105   lactated ringers Stopped (06/01/22 1316)   meropenem (MERREM) IV 1 g (06/02/22 0902)    Assessment & Plan:   Principal Problem:   AMS (altered mental status) Active Problems:   Alcoholic cirrhosis of liver with ascites (HCC)   Anemia   Septic arthritis (HCC)   Hypokalemia   Polysubstance abuse (HCC)   Thrombocytopenia (HCC)   Portal hypertension (HCC)   Bacteremia   AMS (altered mental status) Altered mental status. Patient was found altered and doing drugs and likely also from infection 8/8 CT of the head without any acute finding  MS apprears to be slowly improving. Answering questions appropriately.     Alcoholic cirrhosis of liver with ascites (HCC) Continue with CIWA protocal  Septic arthritis (HCC) Pt Meets septic criteria from suspected knee source and now bacteremia Pt got vancomycin and rocephin.  8/8 bcx +e.coli ID consulted Orthopedics consulted-recommend IR/ultrasound-guided aspiration of prepatellar bursa fluid collection.  Application of incisional wound VAC by  wound care team.  And application of knee immobilizer   E.Coli bactermia Likely 2/2 joint source Continue iv meropenem ID consulted   Anemia Thrombocytopenia 2/2 cirrhosis/portal htn  Platelets 35 H/h stable Monitor closely for bleed.   Hypokalemia Replaced and monitor    Portal hypertension With ascites, severe thrombocytopenia, hyponatremia, hypoalbuminemia, hyperbilirubinemia, coagulopathy GI input was appreciated. Overall px ppor as continues to use drugs and has significant liver disease Can resume outpatient diuretics Lasix and spironolactone once more stable..he needs to know importance of f/u with pcp while on meds and compliance Will need to recommend patient establish care with community center/health clinic for continued care and prescriptions   Polysubstance abuse (HCC) withdrawal precaution. seizures precaution.  Will need counseling when more awake.  DVT prophylaxis: scd Code Status:full Family Communication: none at bedside Disposition Plan:  Status is: Inpatient Remains inpatient appropriate because: IV treatment        LOS: 1 day   Time spent: 52 min    Lynn Ito, MD [facility] Pager [phone]  If 7PM-7AM, please contact night-coverage 06/02/2022, 2:26 PM

## 2022-06-02 NOTE — Consult Note (Signed)
WOC Nurse Consult Note: Reason for Consult:Placement of incisional NPWT device.  I have communicated with Dr. Okey Dupre (Orthopedics) regarding the POC both prior to and following the placement of the NPWT device. Wound type: Surgical Pressure Injury POA: N/A Measurement:4cm x 0.2cm (closely approximated with sutures) Wound bed:N/A Drainage (amount, consistency, odor) None at this time. Moderate amount of serous exudate reported by patient and Dr. Okey Dupre previously Periwound: intact. No erythema, some edema, warmth Dressing procedure/placement/frequency: Wound cleansed with NS, and gently patted dry. Periwound protected with two layers of drape. A linear strip of black granufoam is used to cover the incision, this is secured with drape. An opening is cut into the foam and a mushroom-shaped piece of foam is used to cover the opening (to accommodate the T.R.A.C.C. pad). This is covered with additional drape. The T.R.A.C.C. pad is applied and attached to continuous negative pressure. An immediate seal is achieved. The patient tolerated the procedure well. Incisional NPWT is to be discontinued after 7 days or at time of discharge, whichever comes sooner. Topical care at the time of NPWT discontinuance is to cleanse with NS (or soap and water if at home), top with xeroform gauze and cover with dry gauze secured with either silicone foam or tape. Nursing is to instruct patient on wound care at time of discharge/discontinuance of NPWT. This guidance is provided as Wound Care orders in the EHR.  The patient is to follow up with Orthopedics as he was directed.   WOC nursing team will not follow, but will remain available to this patient, the nursing and medical teams.  Please re-consult if needed.  Thank you for inviting [place] to participate in this patient's Plan of Care.  Ladona Mow, MSN, RN, CNS, GNP, Leda Min, [employer], [employer] phone:  [phone]

## 2022-06-02 NOTE — Procedures (Signed)
PROCEDURE SUMMARY:  Successful US guided diagnostic and therapeutic paracentesis from RLQ.  Yielded 3.2 L of clear, yellow fluid.  No immediate complications.  Pt tolerated well.   Specimen was sent for labs.  EBL < 1 mL  Shon Hough, AGNP 06/02/2022 2:56 PM

## 2022-06-02 NOTE — Progress Notes (Signed)
Went down for procedure.

## 2022-06-02 NOTE — Consult Note (Signed)
Pharmacy Antibiotic Note  Marco Spears is a 46 y.o. male with medical history including homelessness, polysubstance abuse, alcoholic liver cirrhosis, hepatitis C s/p treatment, Hx DVT on apixaban, recurrent right knee septic bursitis admitted on 06/01/2022 with altered mental status / R knee incision drainage.  Pharmacy has been consulted for meropenem and vancomycin dosing.  Plan:  Meropenem 1 g IV q8h for ESBL E coli bacteremia  Vancomycin 2 g IV LD followed by maintenance regimen of vancomycin 1.5 g IV q12h --Calculated AUC: 478, Cmin 10.9 --Daily Scr per protocol --Levels at steady state as clinically indicated  Height: 6\' 3"  (190.5 cm) Weight: 107.7 kg (237 lb 7 oz) IBW/kg (Calculated) : 84.5  Temp (24hrs), Avg:99.1 F (37.3 C), Min:98.2 F (36.8 C), Max:101.2 F (38.4 C)  Recent Labs  Lab 06/01/22 1153 06/01/22 1405 06/02/22 0202 06/02/22 0410  WBC 6.2  --   --  9.8  CREATININE 0.95  --  0.70 0.80  LATICACIDVEN 2.7* 2.0* 1.7 1.6     Estimated Creatinine Clearance: 149.8 mL/min (by C-G formula based on SCr of 0.8 mg/dL).    Allergies  Allergen Reactions   Amoxicillin Rash    TOLERATED CEFAZOLIN PRIOR States it makes his skin turn red    Antimicrobials this admission: Ceftriaxone 8/7 x 1 Vancomycin 8/7 >>  Meropenem 8/8 >>   Dose adjustments this admission: N/A  Microbiology results: 8/7 R Knee Wcx: Klebsiella oxytoca 8/7 BCx: 1/4 bottles GNR, BCID detected ESBL E coli 8/7 UCx: NG  8/7 MRSA PCR: (-)  Thank you for allowing pharmacy to be a part of this patient's care.  10/7 06/02/2022 3:38 PM

## 2022-06-02 NOTE — Progress Notes (Signed)
Brief GI Update Note:  Patient's PMN count is grossly elevated (>13K) on paracentesis consistent with SBP. Currently on Merrem which will help provide adequate coverage for this. Needs 1.5 g/kg of albumin today and 1g/kg on day 3.  Needs 10-day course total of abx from SBP standpoint for treatment, but likely will be receiving longer course of antibiotics due to his bacteremia. He will need SBP prophylaxis going forward upon discharge (trimethoprim-sulfamethoxazole; 1 double strength tablet daily)  Given the pt now has SBP, no initiation of non-selective bb as this is associated with increased mortality. Hospitalist service and pharmacist were updated.  We will continue to follow.  Enis Slipper, DO [facility]

## 2022-06-02 NOTE — Progress Notes (Signed)
       CROSS COVER NOTE  NAME: BRAILEN MACNEAL MRN: [id number] DOB : [birth year]    Date of Service   06/02/22  HPI/Events of Note   Muscle spasm  Interventions   Plan: Robaxin X X      This document was prepared using Dragon voice recognition software and may include unintentional dictation errors.  Bishop Limbo DNP, MHA, FNP-BC Nurse Practitioner [facility] Pager [phone]

## 2022-06-03 ENCOUNTER — Other Ambulatory Visit: Payer: Self-pay

## 2022-06-03 DIAGNOSIS — R4182 Altered mental status, unspecified: Secondary | ICD-10-CM

## 2022-06-03 LAB — CK: Total CK: 59 U/L (ref 49–397)

## 2022-06-03 LAB — PROTEIN, BODY FLUID (OTHER): Total Protein, Body Fluid Other: 0.4 g/dL

## 2022-06-03 LAB — HCV RT-PCR, QUANT (NON-GRAPH): Hepatitis C Quantitation: NOT DETECTED IU/mL

## 2022-06-03 LAB — HCV RNA QUANT: HCV Quantitative: NOT DETECTED IU/mL (ref >50)

## 2022-06-03 LAB — GLUCOSE, CAPILLARY: Glucose-Capillary: 118 mg/dL — ABNORMAL HIGH (ref 70–99)

## 2022-06-03 LAB — CREATININE, SERUM
Creatinine, Ser: 0.73 mg/dL (ref 0.61–1.24)
GFR, Estimated: >60 mL/min

## 2022-06-03 LAB — HEPATITIS B SURFACE ANTIBODY, QUANTITATIVE: Hep B S AB Quant (Post): <3.1 m[IU]/mL — ABNORMAL LOW (ref >9.9)

## 2022-06-03 LAB — PROCALCITONIN: Procalcitonin: 1.79 ng/mL

## 2022-06-03 LAB — HCV AB W REFLEX TO QUANT PCR: HCV Ab: REACTIVE — AB

## 2022-06-03 MED ORDER — FUROSEMIDE 40 MG PO TABS
40.0000 mg | ORAL_TABLET | Freq: Two times a day (BID) | ORAL | Status: DC
  Administered 2022-06-03 – 2022-06-15 (×26): 40 mg via ORAL
  Filled 2022-06-03 (×9): qty 1
  Filled 2022-06-03: qty 2
  Filled 2022-06-03 (×11): qty 1
  Filled 2022-06-03: qty 2
  Filled 2022-06-03 (×4): qty 1

## 2022-06-03 MED ORDER — FOLIC ACID 1 MG PO TABS
1.0000 mg | ORAL_TABLET | Freq: Every day | ORAL | Status: DC
  Administered 2022-06-03 – 2022-06-15 (×13): 1 mg via ORAL
  Filled 2022-06-03 (×13): qty 1

## 2022-06-03 MED ORDER — FERROUS SULFATE 325 (65 FE) MG PO TABS
325.0000 mg | ORAL_TABLET | Freq: Every day | ORAL | Status: DC
  Administered 2022-06-03 – 2022-06-15 (×13): 325 mg via ORAL
  Filled 2022-06-03 (×13): qty 1

## 2022-06-03 MED ORDER — SPIRONOLACTONE 25 MG PO TABS
100.0000 mg | ORAL_TABLET | Freq: Every day | ORAL | Status: DC
  Administered 2022-06-03 – 2022-06-15 (×13): 100 mg via ORAL
  Filled 2022-06-03 (×13): qty 4

## 2022-06-03 MED ORDER — NYSTATIN 100000 UNIT/ML MT SUSP
5.0000 mL | Freq: Four times a day (QID) | OROMUCOSAL | Status: DC
  Administered 2022-06-03 – 2022-06-15 (×49): 500000 [IU] via ORAL
  Filled 2022-06-03 (×50): qty 5

## 2022-06-03 MED ORDER — TRAMADOL HCL 50 MG PO TABS
50.0000 mg | ORAL_TABLET | Freq: Four times a day (QID) | ORAL | Status: DC | PRN
  Administered 2022-06-03 (×2): 50 mg via ORAL
  Filled 2022-06-03 (×2): qty 1

## 2022-06-03 MED ORDER — GABAPENTIN 400 MG PO CAPS
400.0000 mg | ORAL_CAPSULE | Freq: Three times a day (TID) | ORAL | Status: DC
  Administered 2022-06-03 – 2022-06-06 (×10): 400 mg via ORAL
  Filled 2022-06-03 (×10): qty 1

## 2022-06-03 NOTE — Progress Notes (Signed)
PROGRESS NOTE    Marco Spears  GTX:[id number] DOB: January 23, 1976 DOA: 06/01/2022 PCP: Pcp, No    Brief Narrative:  46 y.o. male seen in ed brought by EMS for altered mental status where patient was picked up from a home where he had been on meth.  Patient's right knee incision had clear drainage per report patient was intermittently arousable but restless agitated and almost paranoid.  Patient reports pain over the right knee.  Patient gets startled with sound. Patient is altered and somnolent and not oriented and responds to when called but falls asleep immediately.   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  Patient had received Harvoni for his hep C and has had a DVT for which she has taken Eliquis.  Patient has past medical history of allergies to amoxicillin. Patient had diarrhea right knee recurrent septic prepatellar bursitis and has had I&D on 05/07/2022.  Patient had paracentesis on 05/22/2022 with 3.6 L of hazy yellow fluid.  Last paracentesis was on 05/26/2022 where he had 4.8 L removed. Patient is a large hematoma about 12 cm on his lower abdominal area more towards suprapubic area.   Speech remains somewhat tangential and mumbling.  Per GI not necessarily consistent with hepatic encephalopathy.  Has wound VAC in place on right knee.  Is on meropenem.  Hemodynamically stable .  Infectious disease following.  GI signed off as of 8/9.  Home diuretics resumed.   Assessment & Plan:   Principal Problem:   AMS (altered mental status) Active Problems:   Alcoholic cirrhosis of liver with ascites (HCC)   Anemia   Septic arthritis (HCC)   Hypokalemia   Polysubstance abuse (HCC)   Thrombocytopenia (HCC)   Portal hypertension (HCC)   Bacteremia  Acute metabolic encephalopathy Likely multifactorial.  Patient is a admitted methamphetamine user.  Also has infection which could be contributing.   Patient remains somewhat lethargic with tangential thought and mumbling.  CT head normal. Plan: Avoid nonessential sedatives Monitor mental status carefully Continue antibiotics as below  Septic arthritis right knee ESBL E. coli bacteremia Blood culture positive from 8/8.  Infectious disease on board.  Was previously getting vancomycin and Rocephin.  Changed to meropenem.  Orthopedics consulted.  Incisional wound VAC in place by [facility] team.  Application of knee immobilizer. Plan: Continue meropenem  Anemia Thrombocytopenia Portal hypertension Spontaneous bacterial peritonitis Hepatic cirrhosis No evidence of bleeding Currently no indication for PRBC or platelet transfusion Will monitor closely for bleed Continue meropenem as above as this will cover for SBP as well Resumed home Lasix and Aldactone  Polysubstance use Counseled patient     DVT prophylaxis: SCD Code Status: Full Family Communication: None Disposition Plan: Status is: Inpatient Remains inpatient appropriate because: Acute metabolic encephalopathy, sepsis, bacteremia   Level of care: Med-Surg  Consultants:  ID  Procedures:  Paracentesis Knee arthrocentesis  Antimicrobials: Meropenem   Subjective: Seen and examined.  Resting in bed.  No visible distress.  Mumbling speech.  Tangential thought.  Objective: Vitals:   06/03/22 1300 06/03/22 1400 06/03/22 1500 06/03/22 1600  BP: 117/60 116/65 122/68 132/67  Pulse: (!) 102 96 100 98  Resp: 18 20 (!) 23 (!) 25  Temp: 99 F (37.2 C)   98.9 F (37.2 C)  TempSrc: Axillary   Axillary  SpO2: 93% 94% 99% 97%  Weight:      Height:  Intake/Output Summary (Last 24 hours) at 06/03/2022 1628 Last data filed at 06/03/2022 1500 Gross per 24 hour  Intake 2977.85 ml  Output 3200 ml  Net -222.15 ml   Filed Weights   06/01/22 1148 06/01/22 2050  Weight: 110 kg 107.7 kg    Examination:  General exam: NAD.  Appears chronically ill Respiratory system:  Lungs clear.  Normal work of breathing.  Room air Cardiovascular system: S1-S2, RRR, no murmurs, no pedal edema Gastrointestinal system: Soft, mild distention, normal bowel sounds Central nervous system: Lethargic, oriented times person and place, no focal deficits Extremities: Unable to assess Skin: No rashes, lesions or ulcers Psychiatry: Judgement and insight appear impaired. Mood & affect flattened.     Data Reviewed: I have personally reviewed following labs and imaging studies  CBC: Recent Labs  Lab 06/01/22 1153 06/02/22 0202 06/02/22 0410  WBC 6.2  --  9.8  NEUTROABS 5.5  --   --   HGB 10.7* 9.0* 8.8*  HCT 31.6* 26.1* 25.3*  MCV 98.8  --  98.4  PLT 55*  --  35*   Basic Metabolic Panel: Recent Labs  Lab 06/01/22 1153 06/02/22 0202 06/02/22 0410 06/03/22 0359  NA 131* 132* 130*  --   K 3.4* 3.6 3.5  --   CL 100 103 102  --   CO2 24 24 24   --   GLUCOSE 123* 109* 107*  --   BUN 22* 21* 22*  --   CREATININE 0.95 0.70 0.80 0.73  CALCIUM 8.2* 7.8* 7.7*  --   MG  --  1.7  --   --    GFR: Estimated Creatinine Clearance: 149.8 mL/min (by C-G formula based on SCr of 0.73 mg/dL). Liver Function Tests: Recent Labs  Lab 06/01/22 1153 06/02/22 0202 06/02/22 0410  AST 50*  --  37  ALT 38  --  31  ALKPHOS 115  --  81  BILITOT 5.1* 5.6* 5.4*  PROT 6.0*  --  4.9*  ALBUMIN 2.4*  --  2.0*   No results for input(s): "LIPASE", "AMYLASE" in the last 168 hours. Recent Labs  Lab 06/01/22 1153 06/02/22 1058  AMMONIA 33 36*   Coagulation Profile: Recent Labs  Lab 06/01/22 1153  INR 1.5*   Cardiac Enzymes: Recent Labs  Lab 06/03/22 0359  CKTOTAL 59   BNP (last 3 results) No results for input(s): "PROBNP" in the last 8760 hours. HbA1C: No results for input(s): "HGBA1C" in the last 72 hours. CBG: Recent Labs  Lab 06/01/22 2050  GLUCAP 108*   Lipid Profile: No results for input(s): "CHOL", "HDL", "LDLCALC", "TRIG", "CHOLHDL", "LDLDIRECT" in the last 72  hours. Thyroid Function Tests: Recent Labs    06/01/22 1700  TSH 1.859  FREET4 1.12   Anemia Panel: No results for input(s): "VITAMINB12", "FOLATE", "FERRITIN", "TIBC", "IRON", "RETICCTPCT" in the last 72 hours. Sepsis Labs: Recent Labs  Lab 06/01/22 1153 06/01/22 1405 06/02/22 0202 06/02/22 0410 06/03/22 0359  PROCALCITON <0.10  --   --  2.16 1.79  LATICACIDVEN 2.7* 2.0* 1.7 1.6  --     Recent Results (from the past 240 hour(s))  Resp Panel by RT-PCR (Flu A&B, Covid) Anterior Nasal Swab     Status: None   Collection Time: 06/01/22 12:34 PM   Specimen: Anterior Nasal Swab  Result Value Ref Range Status   SARS Coronavirus 2 by RT PCR NEGATIVE NEGATIVE Final    Comment: (NOTE) SARS-CoV-2 target nucleic acids are NOT DETECTED.  The SARS-CoV-2 RNA  is generally detectable in upper respiratory specimens during the acute phase of infection. The lowest concentration of SARS-CoV-2 viral copies this assay can detect is 138 copies/mL. A negative result does not preclude SARS-Cov-2 infection and should not be used as the sole basis for treatment or other patient management decisions. A negative result may occur with  improper specimen collection/handling, submission of specimen other than nasopharyngeal swab, presence of viral mutation(s) within the areas targeted by this assay, and inadequate number of viral copies(<138 copies/mL). A negative result must be combined with clinical observations, patient history, and epidemiological information. The expected result is Negative.  Fact Sheet for Patients:  BloggerCourse.com  Fact Sheet for Healthcare Providers:  SeriousBroker.it  This test is no t yet approved or cleared by the [place] FDA and  has been authorized for detection and/or diagnosis of SARS-CoV-2 by FDA under an Emergency Use Authorization (EUA). This EUA will remain  in effect (meaning this test can be used) for  the duration of the COVID-19 declaration under Section 564(b)(1) of the Act, 21 U.S.C.section 360bbb-3(b)(1), unless the authorization is terminated  or revoked sooner.       Influenza A by PCR NEGATIVE NEGATIVE Final   Influenza B by PCR NEGATIVE NEGATIVE Final    Comment: (NOTE) The Xpert Xpress SARS-CoV-2/FLU/RSV plus assay is intended as an aid in the diagnosis of influenza from Nasopharyngeal swab specimens and should not be used as a sole basis for treatment. Nasal washings and aspirates are unacceptable for Xpert Xpress SARS-CoV-2/FLU/RSV testing.  Fact Sheet for Patients: BloggerCourse.com  Fact Sheet for Healthcare Providers: SeriousBroker.it  This test is not yet approved or cleared by the [place] FDA and has been authorized for detection and/or diagnosis of SARS-CoV-2 by FDA under an Emergency Use Authorization (EUA). This EUA will remain in effect (meaning this test can be used) for the duration of the COVID-19 declaration under Section 564(b)(1) of the Act, 21 U.S.C. section 360bbb-3(b)(1), unless the authorization is terminated or revoked.  Performed at [facility], [address], [place], [place] [address]   Blood Culture (routine x 2)     Status: Abnormal (Preliminary result)   Collection Time: 06/01/22 12:34 PM   Specimen: BLOOD  Result Value Ref Range Status   Specimen Description   Final    BLOOD Blood Culture adequate volume Performed at [facility], [address], [place], [place] [address]    Special Requests   Final    BOTTLES DRAWN AEROBIC AND ANAEROBIC LEFT ANTECUBITAL Performed at [facility], [address], [place], [place] [address]    Culture  Setup Time   Final    GRAM NEGATIVE RODS ANAEROBIC BOTTLE ONLY CRITICAL RESULT CALLED TO, READ BACK BY AND VERIFIED WITH: NATHAN BELUE AT 0017 ON 06/02/22 BY SS    Culture (A)  Final    ESCHERICHIA  COLI SUSCEPTIBILITIES TO FOLLOW Performed at [facility], [address], [place], [place] [address]    Report Status PENDING  Incomplete  Blood Culture (routine x 2)     Status: None (Preliminary result)   Collection Time: 06/01/22 12:34 PM   Specimen: BLOOD  Result Value Ref Range Status   Specimen Description   Final    BLOOD Blood Culture results may not be optimal due to an inadequate volume of blood received in culture bottles   Special Requests BOTTLES DRAWN AEROBIC AND ANAEROBIC WRIST  Final   Culture   Final  NO GROWTH 2 DAYS Performed at [facility], [address] [place], [place] [address]    Report Status PENDING  Incomplete  Blood Culture ID Panel (Reflexed)     Status: Abnormal   Collection Time: 06/01/22 12:34 PM  Result Value Ref Range Status   Enterococcus faecalis NOT DETECTED NOT DETECTED Final   Enterococcus Faecium NOT DETECTED NOT DETECTED Final   Listeria monocytogenes NOT DETECTED NOT DETECTED Final   Staphylococcus species NOT DETECTED NOT DETECTED Final   Staphylococcus aureus (BCID) NOT DETECTED NOT DETECTED Final   Staphylococcus epidermidis NOT DETECTED NOT DETECTED Final   Staphylococcus lugdunensis NOT DETECTED NOT DETECTED Final   Streptococcus species NOT DETECTED NOT DETECTED Final   Streptococcus agalactiae NOT DETECTED NOT DETECTED Final   Streptococcus pneumoniae NOT DETECTED NOT DETECTED Final   Streptococcus pyogenes NOT DETECTED NOT DETECTED Final   A.calcoaceticus-baumannii NOT DETECTED NOT DETECTED Final   Bacteroides fragilis NOT DETECTED NOT DETECTED Final   Enterobacterales DETECTED (A) NOT DETECTED Final    Comment: Enterobacterales represent a large order of gram negative bacteria, not a single organism. CRITICAL RESULT CALLED TO, READ BACK BY AND VERIFIED WITH: NATHAN BELUE AT 0017 ON 06/02/22 BY SS    Enterobacter cloacae complex NOT DETECTED NOT DETECTED Final   Escherichia coli DETECTED (A) NOT DETECTED  Final    Comment: CRITICAL RESULT CALLED TO, READ BACK BY AND VERIFIED WITH: NATHAN BELUE AT 0017 ON 06/02/22 BY SS    Klebsiella aerogenes NOT DETECTED NOT DETECTED Final   Klebsiella oxytoca NOT DETECTED NOT DETECTED Final   Klebsiella pneumoniae NOT DETECTED NOT DETECTED Final   Proteus species NOT DETECTED NOT DETECTED Final   Salmonella species NOT DETECTED NOT DETECTED Final   Serratia marcescens NOT DETECTED NOT DETECTED Final   Haemophilus influenzae NOT DETECTED NOT DETECTED Final   Neisseria meningitidis NOT DETECTED NOT DETECTED Final   Pseudomonas aeruginosa NOT DETECTED NOT DETECTED Final   Stenotrophomonas maltophilia NOT DETECTED NOT DETECTED Final   Candida albicans NOT DETECTED NOT DETECTED Final   Candida auris NOT DETECTED NOT DETECTED Final   Candida glabrata NOT DETECTED NOT DETECTED Final   Candida krusei NOT DETECTED NOT DETECTED Final   Candida parapsilosis NOT DETECTED NOT DETECTED Final   Candida tropicalis NOT DETECTED NOT DETECTED Final   Cryptococcus neoformans/gattii NOT DETECTED NOT DETECTED Final   CTX-M ESBL DETECTED (A) NOT DETECTED Final    Comment: CRITICAL RESULT CALLED TO, READ BACK BY AND VERIFIED WITH: NATHAN BELUE AT 0017 ON 06/02/22 BY SS (NOTE) Extended spectrum beta-lactamase detected. Recommend a carbapenem as initial therapy.      Carbapenem resistance IMP NOT DETECTED NOT DETECTED Final   Carbapenem resistance KPC NOT DETECTED NOT DETECTED Final   Carbapenem resistance NDM NOT DETECTED NOT DETECTED Final   Carbapenem resist OXA 48 LIKE NOT DETECTED NOT DETECTED Final   Carbapenem resistance VIM NOT DETECTED NOT DETECTED Final    Comment: Performed at [facility], [address], [place], [place] [address]  Urine Culture     Status: None   Collection Time: 06/01/22  1:06 PM   Specimen: In/Out Cath Urine  Result Value Ref Range Status   Specimen Description   Final    IN/OUT CATH URINE Performed at [facility],  [address], [place], [place] [address]    Special Requests   Final    NONE Performed at [facility], [address], [place], [place] [address]  Culture   Final    NO GROWTH Performed at [facility], [address] [place], [place] [address]    Report Status 06/02/2022 FINAL  Final  Aerobic Culture w Gram Stain (superficial specimen)     Status: None (Preliminary result)   Collection Time: 06/01/22  4:35 PM   Specimen: Wound  Result Value Ref Range Status   Specimen Description   Final    WOUND Performed at [facility], [address] [place], [place] [address]    Special Requests   Final    RT KNEE Performed at [facility], [address] [place], [place] [address]    Gram Stain   Final    RARE WBC PRESENT, PREDOMINANTLY PMN RARE GRAM NEGATIVE RODS Performed at [facility], [address] [place], [place] [address]    Culture   Final    FEW KLEBSIELLA OXYTOCA FEW ACINETOBACTER SPECIES    Report Status PENDING  Incomplete  MRSA Next Gen by PCR, Nasal     Status: None   Collection Time: 06/01/22  9:00 PM   Specimen: Nasal Mucosa; Nasal Swab  Result Value Ref Range Status   MRSA by PCR Next Gen NOT DETECTED NOT DETECTED Final    Comment: (NOTE) The GeneXpert MRSA Assay (FDA approved for NASAL specimens only), is one component of a comprehensive MRSA colonization surveillance program. It is not intended to diagnose MRSA infection nor to guide or monitor treatment for MRSA infections. Test performance is not FDA approved in patients less than 48 years old. Performed at [facility], [address] [place], [place] [address]   Aerobic/Anaerobic Culture w Gram Stain (surgical/deep wound)     Status: None (Preliminary result)   Collection Time: 06/02/22  2:37 PM   Specimen: PATH Cytology Peritoneal fluid  Result Value Ref Range Status   Specimen Description   Final    PERITONEAL Performed at [facility], [address] [place], [place] [address]    Special Requests   Final    NONE Performed at [facility], [address] [place], [place] [address]    Gram Stain   Final    FEW WBC PRESENT,BOTH PMN AND MONONUCLEAR NO ORGANISMS SEEN    Culture   Final    NO GROWTH < 12 HOURS Performed at [facility], [address] [place], [place] [address]    Report Status PENDING  Incomplete         Radiology Studies: US Paracentesis  Result Date: 06/02/2022 INDICATION: Patient with history of polysubstance abuse, alcoholic cirrhosis and HCV infection. Patient referred to IR for diagnostic and therapeutic paracentesis for recurrent ascites. EXAM: ULTRASOUND GUIDED DIAGNOSTIC AND THERAPEUTIC RIGHT LOWER QUADRANT PARACENTESIS MEDICATIONS: 10 mL 1 % lidocaine COMPLICATIONS: None immediate. PROCEDURE: Informed written consent was obtained from the patient after a discussion of the risks, benefits and alternatives to treatment. A timeout was performed prior to the initiation of the procedure. Initial ultrasound scanning demonstrates a moderate amount of ascites within the right lower abdominal quadrant. The right lower abdomen was prepped and draped in the usual sterile fashion. 1% lidocaine was used for local anesthesia. Following this, a 6 Fr Safe-T-Centesis catheter was introduced. An ultrasound image was saved for documentation purposes. The paracentesis was performed. The catheter was removed and a dressing was applied. The patient tolerated the procedure well without immediate post procedural complication. FINDINGS: A total of approximately 3.2 L of clear,  yellow fluid was removed. Samples were sent to the laboratory as requested by the clinical team. IMPRESSION: Successful ultrasound-guided paracentesis yielding 3.2 liters of peritoneal fluid. Read by: Alex Gardener, AGNP-BC Electronically Signed   By: Olive Bass M.D.   On: 06/02/2022 16:22   US Abdomen  Complete  Result Date: 06/02/2022 CLINICAL DATA:  Cirrhosis, abdominal pain EXAM: ABDOMEN ULTRASOUND COMPLETE COMPARISON:  None Available. FINDINGS: Gallbladder: Sludge is present. No gallstones or wall thickening visualized. No sonographic Murphy sign noted by sonographer. Common bile duct: Diameter: 3 mm, normal Liver: No focal lesion identified. Increased parenchymal echogenicity. Nodular contour. Portal vein is patent on color Doppler imaging with normal direction of blood flow towards the liver. IVC: No abnormality visualized. Pancreas: Not visualized. Spleen: Enlarged. Right Kidney: Length: 12.9 cm. Echogenicity within normal limits. No mass or hydronephrosis visualized. Left Kidney: Length: 12.0 cm. Echogenicity within normal limits. No mass or hydronephrosis visualized. Abdominal aorta: No aneurysm visualized. Other findings: Moderate ascites. IMPRESSION: Cirrhosis with splenomegaly and moderate ascites. Gallbladder sludge. Electronically Signed   By: Guadlupe Spanish M.D.   On: 06/02/2022 09:29        Scheduled Meds:  Chlorhexidine Gluconate Cloth  6 each Topical Q0600   ferrous sulfate  325 mg Oral Q breakfast   folic acid  1 mg Oral Daily   furosemide  40 mg Oral BID   gabapentin  400 mg Oral TID   nicotine  21 mg Transdermal Daily   nystatin  5 mL Oral QID   sodium chloride flush  3 mL Intravenous Q12H   spironolactone  100 mg Oral Daily   thiamine (VITAMIN B1) injection  100 mg Intravenous Daily   Continuous Infusions:  [START ON 06/05/2022] albumin human     albumin human     meropenem (MERREM) IV Stopped (06/03/22 1012)   vancomycin Stopped (06/03/22 1219)     LOS: 2 days     Tresa Moore, MD [facility]   If 7PM-7AM, please contact night-coverage  06/03/2022, 4:28 PM

## 2022-06-03 NOTE — Progress Notes (Signed)
Date of Admission:  06/01/2022     ID: Marco Spears is a 46 y.o. male  Principal Problem:   AMS (altered mental status) Active Problems:   Alcoholic cirrhosis of liver with ascites (HCC)   Hypokalemia   Anemia   Polysubstance abuse (HCC)   Septic arthritis (HCC)   Thrombocytopenia (HCC)   Portal hypertension (HCC)   Bacteremia    Subjective: Pt is more alert Feeling a little better Muscle cramps better  Medications:   Chlorhexidine Gluconate Cloth  6 each Topical Q0600   ferrous sulfate  325 mg Oral Q breakfast   folic acid  1 mg Oral Daily   furosemide  40 mg Oral BID   gabapentin  400 mg Oral TID   nicotine  21 mg Transdermal Daily   nystatin  5 mL Oral QID   sodium chloride flush  3 mL Intravenous Q12H   spironolactone  100 mg Oral Daily   thiamine (VITAMIN B1) injection  100 mg Intravenous Daily    Objective: Vital signs in last 24 hours: Temp:  [97.6 F (36.4 C)-101.5 F (38.6 C)] 98.6 F (37 C) (08/09 0800) Pulse Rate:  [91-116] 93 (08/09 1000) Resp:  [18-34] 20 (08/09 1000) BP: (104-130)/(58-76) 130/65 (08/09 1000) SpO2:  [86 %-98 %] 98 % (08/09 1000)  LDA Foley Central lines Other catheters  PHYSICAL EXAM:  General: Alert, cooperative, no distress, some tachypnea Head: Normocephalic, without obvious abnormality, atraumatic. Eyes: Conjunctivae clear, anicteric sclerae. Pupils are equal ENT Nares normal. No drainage or sinus tenderness. Lips, mucosa, and tongue normal. No Thrush Neck: Supple, symmetrical, no adenopathy, thyroid: non tender no carotid bruit and no JVD. Back: No CVA tenderness. Lungs: b/l air entry- decreased bases Heart: Tachycardia Abdomen: Soft, distended- bruising Extremities: rt knee wound covered with wound vac Skin: No rashes or lesions. Or bruising Lymph: Cervical, supraclavicular normal. Neurologic: Grossly non-focal  Lab Results Recent Labs    06/01/22 1153 06/02/22 0202 06/02/22 0410 06/03/22 0359  WBC 6.2   --  9.8  --   HGB 10.7* 9.0* 8.8*  --   HCT 31.6* 26.1* 25.3*  --   NA 131* 132* 130*  --   K 3.4* 3.6 3.5  --   CL 100 103 102  --   CO2 24 24 24   --   BUN 22* 21* 22*  --   CREATININE 0.95 0.70 0.80 0.73   Liver Panel Recent Labs    06/01/22 1153 06/02/22 0202 06/02/22 0410  PROT 6.0*  --  4.9*  ALBUMIN 2.4*  --  2.0*  AST 50*  --  37  ALT 38  --  31  ALKPHOS 115  --  81  BILITOT 5.1* 5.6* 5.4*  BILIDIR  --  1.5*  --   IBILI  --  4.1*  --    Microbiology: 06/01/22 BC- ESBL E.coli WC- Kleb oxytoca , acinetobacter Studies/Results: 08/04/2022 Paracentesis  Result Date: 06/02/2022 INDICATION: Patient with history of polysubstance abuse, alcoholic cirrhosis and HCV infection. Patient referred to IR for diagnostic and therapeutic paracentesis for recurrent ascites. EXAM: ULTRASOUND GUIDED DIAGNOSTIC AND THERAPEUTIC RIGHT LOWER QUADRANT PARACENTESIS MEDICATIONS: 10 mL 1 % lidocaine COMPLICATIONS: None immediate. PROCEDURE: Informed written consent was obtained from the patient after a discussion of the risks, benefits and alternatives to treatment. A timeout was performed prior to the initiation of the procedure. Initial ultrasound scanning demonstrates a moderate amount of ascites within the right lower abdominal quadrant. The right lower abdomen was  prepped and draped in the usual sterile fashion. 1% lidocaine was used for local anesthesia. Following this, a 6 Fr Safe-T-Centesis catheter was introduced. An ultrasound image was saved for documentation purposes. The paracentesis was performed. The catheter was removed and a dressing was applied. The patient tolerated the procedure well without immediate post procedural complication. FINDINGS: A total of approximately 3.2 L of clear, yellow fluid was removed. Samples were sent to the laboratory as requested by the clinical team. IMPRESSION: Successful ultrasound-guided paracentesis yielding 3.2 liters of peritoneal fluid. Read by: Alex Gardener, AGNP-BC  Electronically Signed   By: Olive Bass M.D.   On: 06/02/2022 16:22   US Abdomen Complete  Result Date: 06/02/2022 CLINICAL DATA:  Cirrhosis, abdominal pain EXAM: ABDOMEN ULTRASOUND COMPLETE COMPARISON:  None Available. FINDINGS: Gallbladder: Sludge is present. No gallstones or wall thickening visualized. No sonographic Murphy sign noted by sonographer. Common bile duct: Diameter: 3 mm, normal Liver: No focal lesion identified. Increased parenchymal echogenicity. Nodular contour. Portal vein is patent on color Doppler imaging with normal direction of blood flow towards the liver. IVC: No abnormality visualized. Pancreas: Not visualized. Spleen: Enlarged. Right Kidney: Length: 12.9 cm. Echogenicity within normal limits. No mass or hydronephrosis visualized. Left Kidney: Length: 12.0 cm. Echogenicity within normal limits. No mass or hydronephrosis visualized. Abdominal aorta: No aneurysm visualized. Other findings: Moderate ascites. IMPRESSION: Cirrhosis with splenomegaly and moderate ascites. Gallbladder sludge. Electronically Signed   By: Guadlupe Spanish M.D.   On: 06/02/2022 09:29   MR KNEE RIGHT WO CONTRAST  Result Date: 06/01/2022 CLINICAL DATA:  Osteomyelitis suspected, knee, xray done EXAM: MRI OF THE RIGHT KNEE WITHOUT CONTRAST TECHNIQUE: Multiplanar, multisequence MR imaging of the knee was performed. No intravenous contrast was administered. COMPARISON:  Right knee MRI 05/21/2022 FINDINGS: MENISCI Medial: Undersurface tear of the posterior horn and body of the medial meniscus, with mild protrusion of meniscus tissue into the medial inferior gutter, similar to prior. Lateral: Intact lateral meniscus. LIGAMENTS Cruciates: ACL and PCL are intact. Collaterals: Medial collateral ligament is intact. Lateral collateral ligament complex is intact. CARTILAGE Patellofemoral:  Mild chondrosis.  No focal chondral defect. Medial:  Mild chondrosis.  No focal chondral defect Lateral:  Mild chondrosis.  No focal  chondral defect. JOINT: Small joint effusion. POPLITEAL FOSSA: MinisculeBaker's cyst. EXTENSOR MECHANISM: Intact quadriceps tendon. Intact patellar tendon. BONES: No evidence of acute fracture. Decreased marrow edema within the patella. Preserved T1 marrow signal throughout. Other: Diffuse, circumferential skin thickening and soft tissue swelling along the knee. There is a subcutaneous fluid collection in the prepatellar soft tissues, measuring 4.3 x 1.0 x 3.4 cm (volume = 7.7 cm^3), previously measuring 4.0 x 1.0 x 3.7 cm (volume = 7.7 cm^3), not significantly changed. There appears to be a communication with the skin surface anteriorly (axial PD image 21). IMPRESSION: Diffuse skin thickening and soft tissue swelling along the knee, similar prior, compatible with cellulitis. No significant change in size of the prepatellar soft tissue fluid collection, measuring 4.3 x 1.0 x 3.4 cm, with communication with the skin surface anteriorly, correlate with drainage. Unchanged small joint effusion, likely reactive. Decreased marrow edema in the patella with preserved T1 marrow signal, suggesting resolving contusion or reactive marrow change. No evidence of osteomyelitis or convincing findings to suggest septic arthritis. Unchanged mild tricompartment osteoarthritis and undersurface tear of the medial meniscus. Electronically Signed   By: Caprice Renshaw M.D.   On: 06/01/2022 15:17   CT Head Wo Contrast  Result Date: 06/01/2022 CLINICAL DATA:  Mental  status change, unknown cause EXAM: CT HEAD WITHOUT CONTRAST TECHNIQUE: Contiguous axial images were obtained from the base of the skull through the vertex without intravenous contrast. RADIATION DOSE REDUCTION: This exam was performed according to the departmental dose-optimization program which includes automated exposure control, adjustment of the mA and/or kV according to patient size and/or use of iterative reconstruction technique. COMPARISON:  04/20/2022 FINDINGS: Brain:  There is no acute intracranial hemorrhage, mass effect, or edema. No acute appearing loss of gray-white differentiation. Left anteroinferior frontal encephalomalacia and anterior left temporal encephalomalacia again noted. There is no extra-axial fluid collection. Ventricles and sulci are within normal limits in size and configuration. Vascular: No hyperdense vessel or unexpected calcification. Skull: Calvarium is unremarkable. Sinuses/Orbits: No acute finding. Other: None. IMPRESSION: No acute intracranial abnormality. Stable chronic findings detailed above. Electronically Signed   By: Guadlupe Spanish M.D.   On: 06/01/2022 13:57   DG Chest Port 1 View  Result Date: 06/01/2022 CLINICAL DATA:  Questionable sepsis. EXAM: PORTABLE CHEST 1 VIEW COMPARISON:  None Available. FINDINGS: Trachea is midline. Heart size is accentuated by AP technique and low lung volumes. Mild central pulmonary vascular congestion. No airspace consolidation or pleural fluid. IMPRESSION: Central pulmonary vascular congestion may be due in part to low lung volumes. Difficult to exclude impending edema. Electronically Signed   By: Leanna Battles M.D.   On: 06/01/2022 13:12     Assessment/Plan: ?ESBL ecoli bacteremia- on meropenem Gram neg seen on the rt knee wound is klebsiella ans acinetobacter likely contaminants   Recent strep pyogenes infection of rt prepatellar bursa, then had MRSA infection and now gram neg rod   Encephalopathy from SUD- resolved   Decompensated cirrhosis with ascites Paracentesis done Cell count suggestive of SBP Culture pending   Spontaneous bacteria peritonitis- on meropenem Also on vanco Will Dc that   Anemia Thrombocytopenia Hyperbilirubinemia ? Crystal meth use   Discussed the management with patient and care team

## 2022-06-03 NOTE — Progress Notes (Signed)
Inpatient Follow-up/Progress Note   Patient ID: Marco Spears is a 46 y.o. male.  Overnight Events / Subjective Findings NAEON. Tolerating PO. Found to have bacterial peritonitis yesterday based on paracentesis. He notes some periumbilical pain. Vital signs have remained relatively stable. Tangential and mumbling at times in conversation. No other acute gi complaints.  Review of Systems  Constitutional:  Negative for activity change, appetite change, chills, diaphoresis, fatigue, fever and unexpected weight change.  HENT:  Negative for trouble swallowing and voice change.   Respiratory:  Negative for shortness of breath and wheezing.   Cardiovascular:  Negative for chest pain, palpitations and leg swelling.  Gastrointestinal:  Positive for abdominal pain. Negative for abdominal distention, anal bleeding, blood in stool, constipation, diarrhea, nausea and vomiting.  Musculoskeletal:  Positive for arthralgias and myalgias.  Skin:  Negative for color change and pallor.  Neurological:  Negative for dizziness, syncope and weakness.  Psychiatric/Behavioral:  Positive for confusion. The patient is hyperactive. The patient is not nervous/anxious.   All other systems reviewed and are negative.    Medications  Current Facility-Administered Medications:    acetaminophen (TYLENOL) tablet 650 mg, 650 mg, Oral, Q6H PRN, 650 mg at 06/03/22 0330 **OR** acetaminophen (TYLENOL) suppository 650 mg, 650 mg, Rectal, Q6H PRN, Gertha Calkin, MD   [START ON 06/05/2022] albumin human 25 % solution 112.5 g, 112.5 g, Intravenous, Once, Jaynie Collins, DO   albumin human 25 % solution 25 g, 25 g, Intravenous, Once PRN, Jaynie Collins, DO   benzocaine (ORAJEL) 10 % mucosal gel, , Mouth/Throat, QID PRN, Lynn Ito, MD   Chlorhexidine Gluconate Cloth 2 % PADS 6 each, 6 each, Topical, Q0600, Gertha Calkin, MD, 6 each at 06/02/22 0503   dextrose 5 %-0.45 % sodium chloride infusion, , Intravenous,  Continuous, Irena Cords V, MD, Last Rate: 30 mL/hr at 06/03/22 1300, Infusion Verify at 06/03/22 1300   ferrous sulfate tablet 325 mg, 325 mg, Oral, Q breakfast, Georgeann Oppenheim, Sudheer B, MD, 325 mg at 06/03/22 0347   folic acid (FOLVITE) tablet 1 mg, 1 mg, Oral, Daily, Sreenath, Sudheer B, MD, 1 mg at 06/03/22 0943   furosemide (LASIX) tablet 40 mg, 40 mg, Oral, BID, Georgeann Oppenheim, Sudheer B, MD, 40 mg at 06/03/22 0942   gabapentin (NEURONTIN) capsule 400 mg, 400 mg, Oral, TID, Sreenath, Sudheer B, MD, 400 mg at 06/03/22 0942   meropenem (MERREM) 1 g in sodium chloride 0.9 % 100 mL IVPB, 1 g, Intravenous, Q8H, Belue, Lendon Collar, RPH, Stopped at 06/03/22 1012   nicotine (NICODERM CQ - dosed in mg/24 hours) patch 21 mg, 21 mg, Transdermal, Daily, Irena Cords V, MD, 21 mg at 06/03/22 0941   nystatin (MYCOSTATIN) 100000 UNIT/ML suspension 500,000 Units, 5 mL, Oral, QID, Georgeann Oppenheim, Sudheer B, MD, 500,000 Units at 06/03/22 1342   Oral care mouth rinse, 15 mL, Mouth Rinse, PRN, Irena Cords V, MD   sodium chloride flush (NS) 0.9 % injection 3 mL, 3 mL, Intravenous, Q12H, Irena Cords V, MD, 3 mL at 06/03/22 0801   spironolactone (ALDACTONE) tablet 100 mg, 100 mg, Oral, Daily, Sreenath, Sudheer B, MD, 100 mg at 06/03/22 1019   thiamine (VITAMIN B1) injection 100 mg, 100 mg, Intravenous, Daily, Irena Cords V, MD, 100 mg at 06/03/22 0942   traMADol (ULTRAM) tablet 50 mg, 50 mg, Oral, Q6H PRN, Sreenath, Sudheer B, MD   vancomycin (VANCOREADY) IVPB 1500 mg/300 mL, 1,500 mg, Intravenous, Q12H, Tressie Ellis, RPH, Stopped at 06/03/22 1219  [  START ON 06/05/2022] albumin human     albumin human     dextrose 5 % and 0.45% NaCl 30 mL/hr at 06/03/22 1300   meropenem (MERREM) IV Stopped (06/03/22 1012)   vancomycin Stopped (06/03/22 1219)    acetaminophen **OR** acetaminophen, albumin human, benzocaine, mouth rinse, traMADol   Objective    Vitals:   06/03/22 1000 06/03/22 1200 06/03/22 1300 06/03/22 1400  BP: 130/65   117/60   Pulse: 93  (!) 102   Resp: 20 18 18 20   Temp:      TempSrc:      SpO2: 98%  93%   Weight:      Height:         Physical Exam Vitals and nursing note reviewed.  Constitutional:      General: He is not in acute distress.    Appearance: He is ill-appearing. He is not toxic-appearing or diaphoretic.  HENT:     Head: Normocephalic and atraumatic.     Nose: Nose normal.     Mouth/Throat:     Mouth: Mucous membranes are moist.  Eyes:     General: Scleral icterus present.  Pulmonary:     Effort: Pulmonary effort is normal. No respiratory distress.     Breath sounds: No wheezing or rhonchi.  Abdominal:     General: Bowel sounds are normal. There is no distension.     Palpations: Abdomen is soft.     Tenderness: There is abdominal tenderness (periumbilical). There is no rebound.     Comments: No rigidity, non peritoneal  Musculoskeletal:     Cervical back: Neck supple.  Skin:    General: Skin is warm and dry.     Coloration: Skin is jaundiced.  Neurological:     Mental Status: He is alert.  Psychiatric:     Comments: Pressured speech, tangential.       Laboratory Data Recent Labs  Lab 06/01/22 1153 06/02/22 0202 06/02/22 0410  WBC 6.2  --  9.8  HGB 10.7* 9.0* 8.8*  HCT 31.6* 26.1* 25.3*  PLT 55*  --  35*  NEUTOPHILPCT 89  --   --   LYMPHOPCT 5  --   --   MONOPCT 4  --   --   EOSPCT 1  --   --    Recent Labs  Lab 06/01/22 1153 06/02/22 0202 06/02/22 0410 06/03/22 0359  NA 131* 132* 130*  --   K 3.4* 3.6 3.5  --   CL 100 103 102  --   CO2 24 24 24   --   BUN 22* 21* 22*  --   CREATININE 0.95 0.70 0.80 0.73  CALCIUM 8.2* 7.8* 7.7*  --   PROT 6.0*  --  4.9*  --   BILITOT 5.1* 5.6* 5.4*  --   ALKPHOS 115  --  81  --   ALT 38  --  31  --   AST 50*  --  37  --   GLUCOSE 123* 109* 107*  --    Recent Labs  Lab 06/01/22 1153  INR 1.5*      Imaging Studies: Paracentesis  Result Date: 06/02/2022 INDICATION: Patient with history of  polysubstance abuse, alcoholic cirrhosis and HCV infection. Patient referred to IR for diagnostic and therapeutic paracentesis for recurrent ascites. EXAM: ULTRASOUND GUIDED DIAGNOSTIC AND THERAPEUTIC RIGHT LOWER QUADRANT PARACENTESIS MEDICATIONS: 10 mL 1 % lidocaine COMPLICATIONS: None immediate. PROCEDURE: Informed written consent was obtained from the patient after a discussion  of the risks, benefits and alternatives to treatment. A timeout was performed prior to the initiation of the procedure. Initial ultrasound scanning demonstrates a moderate amount of ascites within the right lower abdominal quadrant. The right lower abdomen was prepped and draped in the usual sterile fashion. 1% lidocaine was used for local anesthesia. Following this, a 6 Fr Safe-T-Centesis catheter was introduced. An ultrasound image was saved for documentation purposes. The paracentesis was performed. The catheter was removed and a dressing was applied. The patient tolerated the procedure well without immediate post procedural complication. FINDINGS: A total of approximately 3.2 L of clear, yellow fluid was removed. Samples were sent to the laboratory as requested by the clinical team. IMPRESSION: Successful ultrasound-guided paracentesis yielding 3.2 liters of peritoneal fluid. Read by: Alex Gardener, AGNP-BC Electronically Signed   By: Olive Bass M.D.   On: 06/02/2022 16:22   US Abdomen Complete  Result Date: 06/02/2022 CLINICAL DATA:  Cirrhosis, abdominal pain EXAM: ABDOMEN ULTRASOUND COMPLETE COMPARISON:  None Available. FINDINGS: Gallbladder: Sludge is present. No gallstones or wall thickening visualized. No sonographic Murphy sign noted by sonographer. Common bile duct: Diameter: 3 mm, normal Liver: No focal lesion identified. Increased parenchymal echogenicity. Nodular contour. Portal vein is patent on color Doppler imaging with normal direction of blood flow towards the liver. IVC: No abnormality visualized. Pancreas: Not  visualized. Spleen: Enlarged. Right Kidney: Length: 12.9 cm. Echogenicity within normal limits. No mass or hydronephrosis visualized. Left Kidney: Length: 12.0 cm. Echogenicity within normal limits. No mass or hydronephrosis visualized. Abdominal aorta: No aneurysm visualized. Other findings: Moderate ascites. IMPRESSION: Cirrhosis with splenomegaly and moderate ascites. Gallbladder sludge. Electronically Signed   By: Guadlupe Spanish M.D.   On: 06/02/2022 09:29   MR KNEE RIGHT WO CONTRAST  Result Date: 06/01/2022 CLINICAL DATA:  Osteomyelitis suspected, knee, xray done EXAM: MRI OF THE RIGHT KNEE WITHOUT CONTRAST TECHNIQUE: Multiplanar, multisequence MR imaging of the knee was performed. No intravenous contrast was administered. COMPARISON:  Right knee MRI 05/21/2022 FINDINGS: MENISCI Medial: Undersurface tear of the posterior horn and body of the medial meniscus, with mild protrusion of meniscus tissue into the medial inferior gutter, similar to prior. Lateral: Intact lateral meniscus. LIGAMENTS Cruciates: ACL and PCL are intact. Collaterals: Medial collateral ligament is intact. Lateral collateral ligament complex is intact. CARTILAGE Patellofemoral:  Mild chondrosis.  No focal chondral defect. Medial:  Mild chondrosis.  No focal chondral defect Lateral:  Mild chondrosis.  No focal chondral defect. JOINT: Small joint effusion. POPLITEAL FOSSA: MinisculeBaker's cyst. EXTENSOR MECHANISM: Intact quadriceps tendon. Intact patellar tendon. BONES: No evidence of acute fracture. Decreased marrow edema within the patella. Preserved T1 marrow signal throughout. Other: Diffuse, circumferential skin thickening and soft tissue swelling along the knee. There is a subcutaneous fluid collection in the prepatellar soft tissues, measuring 4.3 x 1.0 x 3.4 cm (volume = 7.7 cm^3), previously measuring 4.0 x 1.0 x 3.7 cm (volume = 7.7 cm^3), not significantly changed. There appears to be a communication with the skin surface  anteriorly (axial PD image 21). IMPRESSION: Diffuse skin thickening and soft tissue swelling along the knee, similar prior, compatible with cellulitis. No significant change in size of the prepatellar soft tissue fluid collection, measuring 4.3 x 1.0 x 3.4 cm, with communication with the skin surface anteriorly, correlate with drainage. Unchanged small joint effusion, likely reactive. Decreased marrow edema in the patella with preserved T1 marrow signal, suggesting resolving contusion or reactive marrow change. No evidence of osteomyelitis or convincing findings to suggest septic arthritis.  Unchanged mild tricompartment osteoarthritis and undersurface tear of the medial meniscus. Electronically Signed   By: Caprice Renshaw M.D.   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 -MELD-Na23, CP-C - now with 1st episode of bacterial peritonitis- unclear if spontaneous or secondary given infected joint, bacteremia and now present in ascites   # sepsis- ecoli bacteremia 2/2 infected joint   # Portal htn- ascites, severe thrombocytopenia (multifactorial with current sepsis), hyponatremia, hypoalbuminemia, hyperbilirubinemia (multifactorial with current sepsis), coagulopathy # polysubstance abuse # chronic anemia- likely multifactorial    Plan:  No labs performed o/n.  Pt's paracentesis consistent with bacterial peritonitis. Currently on merrem per primary, this would also cover for SBP is present. Will need to be sent on outpatient abx for completion of 10 day course, but also will need life long bactrim for sbp ppx (trimethoprim-sulfamethoxazole; 1 double strength tablet daily)  Rcd 1.5g/kg of albumin on 8/9. To receive 1.5g/kg on day 3 (8/11).  Can resume outpatient diuretics- lasix and spironolactone Recommend patient establish care with community center/health clinic for continued care/prescriptions. No beta  blocker given sbp   Recommend high protein diet.   CIWA protocol Lactulose if patient becomes more encephalopathic- 20gm tid until 3 soft bm per day  No further interventions indicated at this time  Overall, prognosis is poor as he continues to use drugs and has significant liver disease (MELD-Na23, CP-C) and bacteremia. Palliative Care/hospice can also be considered   GI to sign off. Available as needed.   I personally performed the service.  Management of other medical comorbidities as per primary team  Thank you for allowing [place] to participate in this patient's care. Please don't hesitate to call if any questions or concerns arise.   Jaynie Collins, DO [facility]  Portions of the record may have been created with voice recognition software. Occasional wrong-word or 'sound-a-like' substitutions may have occurred due to the inherent limitations of voice recognition software.  Read the chart carefully and recognize, using context, where substitutions may have occurred.

## 2022-06-03 NOTE — TOC Initial Note (Signed)
Transition of Care [facility]) - Initial/Assessment Note    Patient Details  Name: Marco Spears MRN: [id number] Date of Birth: 1976/08/22  Transition of Care [facility] [place]) CM/SW Contact:    Allayne Butcher, RN Phone Number: 06/03/2022, 6:12 PM  Clinical Narrative:                 Patient admitted to the hospital with sepsis and cellulitis of the right knee.  RNCM attempted to see patient at the bedside today, patient lying in bed with eyes closed, he declines to talk and requests that RNCM come back tomorrow.  TOC will meet with patient at later time.   Expected Discharge Plan: Homeless Shelter Barriers to Discharge: Continued Medical Work up   Patient Goals and CMS Choice        Expected Discharge Plan and Services Expected Discharge Plan: Homeless Shelter                                              Prior Living Arrangements/Services     Patient language and need for interpreter reviewed:: Yes        Need for Family Participation in Patient Care: Yes (Comment) Care giver support system in place?: No (comment)   Criminal Activity/Legal Involvement Pertinent to Current Situation/Hospitalization: No - Comment as needed  Activities of Daily Living Home Assistive Devices/Equipment: None ADL Screening (condition at time of admission) Patient's cognitive ability adequate to safely complete daily activities?: Yes Is the patient deaf or have difficulty hearing?: No Does the patient have difficulty seeing, even when wearing glasses/contacts?: Yes Does the patient have difficulty concentrating, remembering, or making decisions?: No Patient able to express need for assistance with ADLs?: Yes Does the patient have difficulty dressing or bathing?: Yes Independently performs ADLs?: Yes (appropriate for developmental age) Does the patient have difficulty walking or climbing stairs?: Yes Weakness of Legs: Both Weakness of Arms/Hands: None  Permission Sought/Granted                   Emotional Assessment Appearance:: Appears older than stated age Attitude/Demeanor/Rapport: Lethargic     Alcohol / Substance Use: Illicit Drugs Psych Involvement: No (comment)  Admission diagnosis:  Somnolence [R40.0] Generalized abdominal pain [R10.84] Cellulitis of right knee [L03.115] Alcoholic cirrhosis of liver with ascites (HCC) [K70.31] AMS (altered mental status) [R41.82] Sepsis without acute organ dysfunction, due to unspecified organism [facility]) [A41.9] Patient Active Problem List   Diagnosis Date Noted   Portal hypertension (HCC) 06/02/2022   Bacteremia 06/02/2022   AMS (altered mental status) 06/01/2022   Thrombocytopenia (HCC) 06/01/2022   Septic bursitis 05/22/2022   Septic arthritis (HCC) 05/21/2022   Alcohol abuse 05/21/2022   On anticoagulant therapy 05/21/2022   Hypocalcemia 05/21/2022   Tongue lesion 05/21/2022   Cellulitis of right leg    Cirrhosis of liver without ascites (HCC)    Polysubstance abuse (HCC)    Cellulitis 05/05/2022   Sepsis (HCC) 05/05/2022   Alcoholic cirrhosis of liver with ascites (HCC) 04/21/2022   Hypokalemia 04/21/2022   Anemia 04/21/2022   PCP:  Pcp, No Pharmacy:   [facility] [address] [place] [address] Phone: [phone] Fax: [phone]     Social Determinants of Health (SDOH) Interventions    Readmission Risk Interventions    05/06/2022    2:26 PM  Readmission Risk Prevention Plan  Transportation Screening Complete  Medication Review Ocean[employer]) Complete

## 2022-06-04 ENCOUNTER — Other Ambulatory Visit: Payer: Self-pay

## 2022-06-04 DIAGNOSIS — B9620 Unspecified Escherichia coli [E. coli] as the cause of diseases classified elsewhere: Secondary | ICD-10-CM

## 2022-06-04 DIAGNOSIS — F1010 Alcohol abuse, uncomplicated: Secondary | ICD-10-CM

## 2022-06-04 DIAGNOSIS — M00861 Arthritis due to other bacteria, right knee: Secondary | ICD-10-CM

## 2022-06-04 LAB — COMPREHENSIVE METABOLIC PANEL
ALT: 30 U/L (ref 0–44)
AST: 49 U/L — ABNORMAL HIGH (ref 15–41)
Albumin: 2.6 g/dL — ABNORMAL LOW (ref 3.5–5.0)
Alkaline Phosphatase: 67 U/L (ref 38–126)
Anion gap: 5 (ref 5–15)
BUN: 11 mg/dL (ref 6–20)
CO2: 23 mmol/L (ref 22–32)
Calcium: 7.6 mg/dL — ABNORMAL LOW (ref 8.9–10.3)
Chloride: 103 mmol/L (ref 98–111)
Creatinine, Ser: 0.63 mg/dL (ref 0.61–1.24)
GFR, Estimated: >60 mL/min (ref >60)
Glucose, Bld: 152 mg/dL — ABNORMAL HIGH (ref 70–99)
Potassium: 2.8 mmol/L — ABNORMAL LOW (ref 3.5–5.1)
Sodium: 131 mmol/L — ABNORMAL LOW (ref 135–145)
Total Bilirubin: 2.2 mg/dL — ABNORMAL HIGH (ref 0.3–1.2)
Total Protein: 4.9 g/dL — ABNORMAL LOW (ref 6.5–8.1)

## 2022-06-04 LAB — CBC WITH DIFFERENTIAL/PLATELET
Abs Immature Granulocytes: 0.03 10*3/uL (ref 0.00–0.07)
Basophils Absolute: 0 10*3/uL (ref 0.0–0.1)
Basophils Relative: 0 %
Eosinophils Absolute: 0.1 10*3/uL (ref 0.0–0.5)
Eosinophils Relative: 2 %
HCT: 22.6 % — ABNORMAL LOW (ref 39.0–52.0)
Hemoglobin: 7.9 g/dL — ABNORMAL LOW (ref 13.0–17.0)
Immature Granulocytes: 1 %
Lymphocytes Relative: 17 %
Lymphs Abs: 0.6 10*3/uL — ABNORMAL LOW (ref 0.7–4.0)
MCH: 34.2 pg — ABNORMAL HIGH (ref 26.0–34.0)
MCHC: 35 g/dL (ref 30.0–36.0)
MCV: 97.8 fL (ref 80.0–100.0)
Monocytes Absolute: 0.6 10*3/uL (ref 0.1–1.0)
Monocytes Relative: 17 %
Neutro Abs: 2.1 10*3/uL (ref 1.7–7.7)
Neutrophils Relative %: 63 %
Platelets: 33 10*3/uL — ABNORMAL LOW (ref 150–400)
RBC: 2.31 MIL/uL — ABNORMAL LOW (ref 4.22–5.81)
RDW: 15.6 % — ABNORMAL HIGH (ref 11.5–15.5)
WBC: 3.3 10*3/uL — ABNORMAL LOW (ref 4.0–10.5)
nRBC: 0 % (ref 0.0–0.2)

## 2022-06-04 LAB — CREATININE, SERUM
Creatinine, Ser: 0.75 mg/dL (ref 0.61–1.24)
GFR, Estimated: >60 mL/min (ref >60)

## 2022-06-04 LAB — GLUCOSE, CAPILLARY: Glucose-Capillary: 140 mg/dL — ABNORMAL HIGH (ref 70–99)

## 2022-06-04 LAB — ACID FAST SMEAR (AFB, MYCOBACTERIA): Acid Fast Smear: NEGATIVE

## 2022-06-04 MED ORDER — POTASSIUM CHLORIDE CRYS ER 20 MEQ PO TBCR
40.0000 meq | EXTENDED_RELEASE_TABLET | ORAL | Status: AC
  Administered 2022-06-04 (×3): 40 meq via ORAL
  Filled 2022-06-04 (×3): qty 2

## 2022-06-04 MED ORDER — METHOCARBAMOL 500 MG PO TABS
750.0000 mg | ORAL_TABLET | Freq: Three times a day (TID) | ORAL | Status: DC
  Administered 2022-06-04 – 2022-06-15 (×35): 750 mg via ORAL
  Filled 2022-06-04 (×35): qty 2

## 2022-06-04 NOTE — Progress Notes (Signed)
Pt has telemetry orders in place. No telemetry boxes are available at this moment. CCMD made aware.

## 2022-06-04 NOTE — Consult Note (Addendum)
Pharmacy Antibiotic Note  Marco Spears is a 46 y.o. male with medical history including homelessness, polysubstance abuse, alcoholic liver cirrhosis, hepatitis C s/p treatment, Hx DVT on apixaban, recurrent right knee septic bursitis admitted on 06/01/2022 with altered mental status / R knee incision drainage. Patient was recently discharged on linezolid for R knee wound cultures growing MRSA. Pharmacy has been consulted for meropenem dosing.  Plan: Day 3 of antibiotics. Vancomycin has been discontinued per ID recommendation. Continue Meropenem 1 g IV q8h for ESBL E coli bacteremia  Height: 6\' 3"  (190.5 cm) Weight: 107.1 kg (236 lb 1.8 oz) IBW/kg (Calculated) : 84.5  Temp (24hrs), Avg:99.5 F (37.5 C), Min:98.9 F (37.2 C), Max:100.2 F (37.9 C)  Recent Labs  Lab 06/01/22 1153 06/01/22 1405 06/02/22 0202 06/02/22 0410 06/03/22 0359 06/04/22 0425  WBC 6.2  --   --  9.8  --   --   CREATININE 0.95  --  0.70 0.80 0.73 0.75  LATICACIDVEN 2.7* 2.0* 1.7 1.6  --   --      Estimated Creatinine Clearance: 149.3 mL/min (by C-G formula based on SCr of 0.75 mg/dL).    Allergies  Allergen Reactions   Amoxicillin Rash    TOLERATED CEFAZOLIN PRIOR States it makes his skin turn red    Antimicrobials this admission: Ceftriaxone 8/7 x 1 Vancomycin 8/7-8/10  Meropenem 8/8 >>   Dose adjustments this admission: N/A  Microbiology results: 8/7 R Knee Wcx: Klebsiella oxytoca and Acinetobacter spp (per ID, likely contaminants) 8/7 BCx: 1/4 bottles GNR, BCID detected ESBL E coli 8/7 UCx: NG final 8/7 MRSA PCR: negative  Thank you for allowing pharmacy to be a part of this patient's care.  10/7, PharmD PGY1 Pharmacy Resident 06/04/2022 2:14 PM

## 2022-06-04 NOTE — Progress Notes (Signed)
PROGRESS NOTE    Marco Spears  GNF:[id number] DOB: 14-Jun-1976 DOA: 06/01/2022 PCP: Pcp, No    Brief Narrative:  46 y.o. male seen in ed brought by EMS for altered mental status where patient was picked up from a home where he had been on meth.  Patient's right knee incision had clear drainage per report patient was intermittently arousable but restless agitated and almost paranoid.  Patient reports pain over the right knee.  Patient gets startled with sound. Patient is altered and somnolent and not oriented and responds to when called but falls asleep immediately.   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  Patient had received Harvoni for his hep C and has had a DVT for which she has taken Eliquis.  Patient has past medical history of allergies to amoxicillin. Patient had diarrhea right knee recurrent septic prepatellar bursitis and has had I&D on 05/07/2022.  Patient had paracentesis on 05/22/2022 with 3.6 L of hazy yellow fluid.  Last paracentesis was on 05/26/2022 where he had 4.8 L removed. Patient is a large hematoma about 12 cm on his lower abdominal area more towards suprapubic area.   Speech remains somewhat tangential and mumbling.  Per GI not necessarily consistent with hepatic encephalopathy.  Has wound VAC in place on right knee.  Is on meropenem.  Hemodynamically stable .  Infectious disease following.  GI signed off as of 8/9.  Home diuretics resumed. Mental status improved on 8/10.  Continues to endorse severe muscle spasms   Assessment & Plan:   Principal Problem:   AMS (altered mental status) Active Problems:   Alcoholic cirrhosis of liver with ascites (HCC)   Anemia   Septic arthritis (HCC)   Hypokalemia   Polysubstance abuse (HCC)   Thrombocytopenia (HCC)   Portal hypertension (HCC)   Bacteremia  Acute metabolic encephalopathy Likely multifactorial.  Patient is a  admitted methamphetamine user.  Also has infection which could be contributing.  Patient remains somewhat lethargic with tangential thought and mumbling.  CT head normal. 8/10: Mental status improved Plan: Avoid nonessential sedatives Monitor mental status carefully Continue antibiotics as below  Septic arthritis right knee ESBL E. coli bacteremia Blood culture positive from 8/8.  Infectious disease on board.  Was previously getting vancomycin and Rocephin.  Changed to meropenem.  Orthopedics consulted.  Incisional wound VAC in place by [facility] team.  Application of knee immobilizer. Plan: Continue meropenem ID following  Anemia Thrombocytopenia Portal hypertension Spontaneous bacterial peritonitis Hepatic cirrhosis No evidence of bleeding Currently no indication for PRBC or platelet transfusion Will monitor closely for bleed Continue meropenem as above as this will cover for SBP as well Continue home Lasix and Aldactone  Polysubstance use Counseled patient Avoid narcotics    DVT prophylaxis: SCD Code Status: Full Family Communication: None Disposition Plan: Status is: Inpatient Remains inpatient appropriate because: Acute metabolic encephalopathy, sepsis, bacteremia   Level of care: Med-Surg  Consultants:  ID  Procedures:  Paracentesis Knee arthrocentesis  Antimicrobials: Meropenem   Subjective: Seen and examined.  More awake this morning.  Sitting up in bed eating breakfast.  Endorses diffuse muscle spasms/cramps  Objective: Vitals:   06/04/22 0320 06/04/22 0452 06/04/22 0454 06/04/22 0732  BP: 125/69 112/61  113/68  Pulse: (!) 111 (!) 110  95  Resp: 20 20  18   Temp: 100.1 F (37.8 C) 100 F (37.8 C)  100.2 F (37.9 C)  TempSrc:  Oral  Oral  SpO2: 95% 96%  96%  Weight:   107.1 kg   Height:        Intake/Output Summary (Last 24 hours) at 06/04/2022 1050 Last data filed at 06/04/2022 0742 Gross per 24 hour  Intake 1743.35 ml  Output 5300 ml  Net  -3556.65 ml   Filed Weights   06/01/22 1148 06/01/22 2050 06/04/22 0454  Weight: 110 kg 107.7 kg 107.1 kg    Examination:  General exam: NAD.  Chronically ill-appearing Respiratory system: Lungs clear.  Normal work of breathing.  Room air Cardiovascular system: S1-S2, RRR, no murmurs, no pedal edema Gastrointestinal system: Soft, mild distention, normal bowel sounds Central nervous system: Alert, oriented x 3, no focal deficits Extremities: Unable to assess Skin: No rashes, lesions or ulcers Psychiatry: Judgement and insight appear impaired. Mood & affect flattened.     Data Reviewed: I have personally reviewed following labs and imaging studies  CBC: Recent Labs  Lab 06/01/22 1153 06/02/22 0202 06/02/22 0410 06/04/22 0817  WBC 6.2  --  9.8 3.3*  NEUTROABS 5.5  --   --  2.1  HGB 10.7* 9.0* 8.8* 7.9*  HCT 31.6* 26.1* 25.3* 22.6*  MCV 98.8  --  98.4 97.8  PLT 55*  --  35* 33*   Basic Metabolic Panel: Recent Labs  Lab 06/01/22 1153 06/02/22 0202 06/02/22 0410 06/03/22 0359 06/04/22 0425 06/04/22 0817  NA 131* 132* 130*  --   --  131*  K 3.4* 3.6 3.5  --   --  2.8*  CL 100 103 102  --   --  103  CO2 24 24 24   --   --  23  GLUCOSE 123* 109* 107*  --   --  152*  BUN 22* 21* 22*  --   --  11  CREATININE 0.95 0.70 0.80 0.73 0.75 0.63  CALCIUM 8.2* 7.8* 7.7*  --   --  7.6*  MG  --  1.7  --   --   --   --    GFR: Estimated Creatinine Clearance: 149.3 mL/min (by C-G formula based on SCr of 0.63 mg/dL). Liver Function Tests: Recent Labs  Lab 06/01/22 1153 06/02/22 0202 06/02/22 0410 06/04/22 0817  AST 50*  --  37 49*  ALT 38  --  31 30  ALKPHOS 115  --  81 67  BILITOT 5.1* 5.6* 5.4* 2.2*  PROT 6.0*  --  4.9* 4.9*  ALBUMIN 2.4*  --  2.0* 2.6*   No results for input(s): "LIPASE", "AMYLASE" in the last 168 hours. Recent Labs  Lab 06/01/22 1153 06/02/22 1058  AMMONIA 33 36*   Coagulation Profile: Recent Labs  Lab 06/01/22 1153  INR 1.5*   Cardiac  Enzymes: Recent Labs  Lab 06/03/22 0359  CKTOTAL 59   BNP (last 3 results) No results for input(s): "PROBNP" in the last 8760 hours. HbA1C: No results for input(s): "HGBA1C" in the last 72 hours. CBG: Recent Labs  Lab 06/01/22 2050 06/03/22 1933 06/04/22 0749  GLUCAP 108* 118* 140*   Lipid Profile: No results for input(s): "CHOL", "HDL", "LDLCALC", "TRIG", "CHOLHDL", "LDLDIRECT" in the last 72 hours. Thyroid Function Tests: Recent Labs    06/01/22 1700  TSH 1.859  FREET4 1.12   Anemia Panel: No results for input(s): "VITAMINB12", "FOLATE", "FERRITIN", "TIBC", "IRON", "RETICCTPCT" in the last 72 hours. Sepsis Labs: Recent Labs  Lab 06/01/22 1153 06/01/22 1405 06/02/22 0202 06/02/22 0410 06/03/22 0359  PROCALCITON <0.10  --   --  2.16 1.79  LATICACIDVEN 2.7* 2.0* 1.7 1.6  --     Recent Results (from the past 240 hour(s))  Resp Panel by RT-PCR (Flu A&B, Covid) Anterior Nasal Swab     Status: None   Collection Time: 06/01/22 12:34 PM   Specimen: Anterior Nasal Swab  Result Value Ref Range Status   SARS Coronavirus 2 by RT PCR NEGATIVE NEGATIVE Final    Comment: (NOTE) SARS-CoV-2 target nucleic acids are NOT DETECTED.  The SARS-CoV-2 RNA is generally detectable in upper respiratory specimens during the acute phase of infection. The lowest concentration of SARS-CoV-2 viral copies this assay can detect is 138 copies/mL. A negative result does not preclude SARS-Cov-2 infection and should not be used as the sole basis for treatment or other patient management decisions. A negative result may occur with  improper specimen collection/handling, submission of specimen other than nasopharyngeal swab, presence of viral mutation(s) within the areas targeted by this assay, and inadequate number of viral copies(<138 copies/mL). A negative result must be combined with clinical observations, patient history, and epidemiological information. The expected result is  Negative.  Fact Sheet for Patients:  EntrepreneurPulse.com.au  Fact Sheet for Healthcare Providers:  IncredibleEmployment.be  This test is no t yet approved or cleared by the [place] FDA and  has been authorized for detection and/or diagnosis of SARS-CoV-2 by FDA under an Emergency Use Authorization (EUA). This EUA will remain  in effect (meaning this test can be used) for the duration of the COVID-19 declaration under Section 564(b)(1) of the Act, 21 U.S.C.section 360bbb-3(b)(1), unless the authorization is terminated  or revoked sooner.       Influenza A by PCR NEGATIVE NEGATIVE Final   Influenza B by PCR NEGATIVE NEGATIVE Final    Comment: (NOTE) The Xpert Xpress SARS-CoV-2/FLU/RSV plus assay is intended as an aid in the diagnosis of influenza from Nasopharyngeal swab specimens and should not be used as a sole basis for treatment. Nasal washings and aspirates are unacceptable for Xpert Xpress SARS-CoV-2/FLU/RSV testing.  Fact Sheet for Patients: EntrepreneurPulse.com.au  Fact Sheet for Healthcare Providers: IncredibleEmployment.be  This test is not yet approved or cleared by the [place] FDA and has been authorized for detection and/or diagnosis of SARS-CoV-2 by FDA under an Emergency Use Authorization (EUA). This EUA will remain in effect (meaning this test can be used) for the duration of the COVID-19 declaration under Section 564(b)(1) of the Act, 21 U.S.C. section 360bbb-3(b)(1), unless the authorization is terminated or revoked.  Performed at [facility], [address] [place], [place] [address]   Blood Culture (routine x 2)     Status: Abnormal   Collection Time: 06/01/22 12:34 PM   Specimen: BLOOD  Result Value Ref Range Status   Specimen Description   Final    BLOOD Blood Culture adequate volume Performed at [facility], [address],  [place], [place] [address]    Special Requests   Final    BOTTLES DRAWN AEROBIC AND ANAEROBIC LEFT ANTECUBITAL Performed at [facility], [address] [place], [place] [address]    Culture  Setup Time   Final    GRAM NEGATIVE RODS ANAEROBIC BOTTLE ONLY CRITICAL RESULT CALLED TO, READ BACK BY AND VERIFIED WITH: NATHAN BELUE AT 0017 ON 06/02/22 BY SS    Culture (A)  Final    ESCHERICHIA COLI Confirmed Extended Spectrum Beta-Lactamase Producer (ESBL).  In bloodstream infections from ESBL organisms, carbapenems are preferred over piperacillin/tazobactam. They are shown to have  a lower risk of mortality.    Report Status 06/04/2022 FINAL  Final   Organism ID, Bacteria ESCHERICHIA COLI  Final      Susceptibility   Escherichia coli - MIC*    AMPICILLIN >=32 RESISTANT Resistant     CEFAZOLIN >=64 RESISTANT Resistant     CEFEPIME 16 RESISTANT Resistant     CEFTAZIDIME RESISTANT Resistant     CEFTRIAXONE >=64 RESISTANT Resistant     CIPROFLOXACIN >=4 RESISTANT Resistant     GENTAMICIN <=1 SENSITIVE Sensitive     IMIPENEM <=0.25 SENSITIVE Sensitive     TRIMETH/SULFA >=320 RESISTANT Resistant     AMPICILLIN/SULBACTAM 16 INTERMEDIATE Intermediate     PIP/TAZO <=4 SENSITIVE Sensitive     * ESCHERICHIA COLI  Blood Culture (routine x 2)     Status: None (Preliminary result)   Collection Time: 06/01/22 12:34 PM   Specimen: BLOOD  Result Value Ref Range Status   Specimen Description   Final    BLOOD Blood Culture results may not be optimal due to an inadequate volume of blood received in culture bottles   Special Requests BOTTLES DRAWN AEROBIC AND ANAEROBIC WRIST  Final   Culture   Final    NO GROWTH 3 DAYS Performed at [facility], [address] [place], [place] [address]    Report Status PENDING  Incomplete  Blood Culture ID Panel (Reflexed)     Status: Abnormal   Collection Time: 06/01/22 12:34 PM  Result Value Ref Range Status   Enterococcus faecalis NOT DETECTED  NOT DETECTED Final   Enterococcus Faecium NOT DETECTED NOT DETECTED Final   Listeria monocytogenes NOT DETECTED NOT DETECTED Final   Staphylococcus species NOT DETECTED NOT DETECTED Final   Staphylococcus aureus (BCID) NOT DETECTED NOT DETECTED Final   Staphylococcus epidermidis NOT DETECTED NOT DETECTED Final   Staphylococcus lugdunensis NOT DETECTED NOT DETECTED Final   Streptococcus species NOT DETECTED NOT DETECTED Final   Streptococcus agalactiae NOT DETECTED NOT DETECTED Final   Streptococcus pneumoniae NOT DETECTED NOT DETECTED Final   Streptococcus pyogenes NOT DETECTED NOT DETECTED Final   A.calcoaceticus-baumannii NOT DETECTED NOT DETECTED Final   Bacteroides fragilis NOT DETECTED NOT DETECTED Final   Enterobacterales DETECTED (A) NOT DETECTED Final    Comment: Enterobacterales represent a large order of gram negative bacteria, not a single organism. CRITICAL RESULT CALLED TO, READ BACK BY AND VERIFIED WITH: NATHAN BELUE AT 0017 ON 06/02/22 BY SS    Enterobacter cloacae complex NOT DETECTED NOT DETECTED Final   Escherichia coli DETECTED (A) NOT DETECTED Final    Comment: CRITICAL RESULT CALLED TO, READ BACK BY AND VERIFIED WITH: NATHAN BELUE AT 0017 ON 06/02/22 BY SS    Klebsiella aerogenes NOT DETECTED NOT DETECTED Final   Klebsiella oxytoca NOT DETECTED NOT DETECTED Final   Klebsiella pneumoniae NOT DETECTED NOT DETECTED Final   Proteus species NOT DETECTED NOT DETECTED Final   Salmonella species NOT DETECTED NOT DETECTED Final   Serratia marcescens NOT DETECTED NOT DETECTED Final   Haemophilus influenzae NOT DETECTED NOT DETECTED Final   Neisseria meningitidis NOT DETECTED NOT DETECTED Final   Pseudomonas aeruginosa NOT DETECTED NOT DETECTED Final   Stenotrophomonas maltophilia NOT DETECTED NOT DETECTED Final   Candida albicans NOT DETECTED NOT DETECTED Final   Candida auris NOT DETECTED NOT DETECTED Final   Candida glabrata NOT DETECTED NOT DETECTED Final   Candida  krusei NOT DETECTED NOT DETECTED Final   Candida parapsilosis NOT DETECTED NOT DETECTED Final  Candida tropicalis NOT DETECTED NOT DETECTED Final   Cryptococcus neoformans/gattii NOT DETECTED NOT DETECTED Final   CTX-M ESBL DETECTED (A) NOT DETECTED Final    Comment: CRITICAL RESULT CALLED TO, READ BACK BY AND VERIFIED WITH: NATHAN BELUE AT 0017 ON 06/02/22 BY SS (NOTE) Extended spectrum beta-lactamase detected. Recommend a carbapenem as initial therapy.      Carbapenem resistance IMP NOT DETECTED NOT DETECTED Final   Carbapenem resistance KPC NOT DETECTED NOT DETECTED Final   Carbapenem resistance NDM NOT DETECTED NOT DETECTED Final   Carbapenem resist OXA 48 LIKE NOT DETECTED NOT DETECTED Final   Carbapenem resistance VIM NOT DETECTED NOT DETECTED Final    Comment: Performed at [facility], [address] [place], [place] [address]  Urine Culture     Status: None   Collection Time: 06/01/22  1:06 PM   Specimen: In/Out Cath Urine  Result Value Ref Range Status   Specimen Description   Final    IN/OUT CATH URINE Performed at [facility], [address], [place], [place] [address]    Special Requests   Final    NONE Performed at [facility], [address], [place], [place] [address]    Culture   Final    NO GROWTH Performed at [facility], [place] [address], [place], [place] [address]    Report Status 06/02/2022 FINAL  Final  Aerobic Culture w Gram Stain (superficial specimen)     Status: None (Preliminary result)   Collection Time: 06/01/22  4:35 PM   Specimen: Wound  Result Value Ref Range Status   Specimen Description   Final    WOUND Performed at [facility], [address] [place], [place] [address]    Special Requests   Final    RT KNEE Performed at [facility], [address] [place], [place] [address]    Gram Stain   Final    RARE WBC PRESENT, PREDOMINANTLY PMN RARE GRAM NEGATIVE RODS Performed at  [facility], [place] [address] [place], [place] [address]    Culture   Final    FEW KLEBSIELLA OXYTOCA FEW ACINETOBACTER SPECIES    Report Status PENDING  Incomplete  MRSA Next Gen by PCR, Nasal     Status: None   Collection Time: 06/01/22  9:00 PM   Specimen: Nasal Mucosa; Nasal Swab  Result Value Ref Range Status   MRSA by PCR Next Gen NOT DETECTED NOT DETECTED Final    Comment: (NOTE) The GeneXpert MRSA Assay (FDA approved for NASAL specimens only), is one component of a comprehensive MRSA colonization surveillance program. It is not intended to diagnose MRSA infection nor to guide or monitor treatment for MRSA infections. Test performance is not FDA approved in patients less than 34 years old. Performed at [facility], [address] [place], [place] [address]   Aerobic/Anaerobic Culture w Gram Stain (surgical/deep wound)     Status: None (Preliminary result)   Collection Time: 06/02/22  2:37 PM   Specimen: PATH Cytology Peritoneal fluid  Result Value Ref Range Status   Specimen Description   Final    PERITONEAL Performed at [facility], [address], [place],  [address]    Special Requests   Final    NONE Performed at [facility], [address] [place],  [address]    Gram Stain   Final    FEW WBC PRESENT,BOTH PMN AND MONONUCLEAR NO ORGANISMS SEEN  Culture   Final    NO GROWTH 2 DAYS Performed at [facility], [place] [address], [place], [place] [address]    Report Status PENDING  Incomplete         Radiology Studies: US Paracentesis  Result Date: 06/02/2022 INDICATION: Patient with history of polysubstance abuse, alcoholic cirrhosis and HCV infection. Patient referred to IR for diagnostic and therapeutic paracentesis for recurrent ascites. EXAM: ULTRASOUND GUIDED DIAGNOSTIC AND THERAPEUTIC RIGHT LOWER QUADRANT PARACENTESIS MEDICATIONS: 10 mL 1 % lidocaine COMPLICATIONS: None immediate. PROCEDURE:  Informed written consent was obtained from the patient after a discussion of the risks, benefits and alternatives to treatment. A timeout was performed prior to the initiation of the procedure. Initial ultrasound scanning demonstrates a moderate amount of ascites within the right lower abdominal quadrant. The right lower abdomen was prepped and draped in the usual sterile fashion. 1% lidocaine was used for local anesthesia. Following this, a 6 Fr Safe-T-Centesis catheter was introduced. An ultrasound image was saved for documentation purposes. The paracentesis was performed. The catheter was removed and a dressing was applied. The patient tolerated the procedure well without immediate post procedural complication. FINDINGS: A total of approximately 3.2 L of clear, yellow fluid was removed. Samples were sent to the laboratory as requested by the clinical team. IMPRESSION: Successful ultrasound-guided paracentesis yielding 3.2 liters of peritoneal fluid. Read by: Narda Rutherford, AGNP-BC Electronically Signed   By: Albin Felling M.D.   On: 06/02/2022 16:22        Scheduled Meds:  Chlorhexidine Gluconate Cloth  6 each Topical Q0600   ferrous sulfate  325 mg Oral Q breakfast   folic acid  1 mg Oral Daily   furosemide  40 mg Oral BID   gabapentin  400 mg Oral TID   methocarbamol  750 mg Oral TID   nicotine  21 mg Transdermal Daily   nystatin  5 mL Oral QID   potassium chloride  40 mEq Oral Q2H   sodium chloride flush  3 mL Intravenous Q12H   spironolactone  100 mg Oral Daily   thiamine (VITAMIN B1) injection  100 mg Intravenous Daily   Continuous Infusions:  [START ON 06/05/2022] albumin human     albumin human     meropenem (MERREM) IV 1 g (06/04/22 0907)     LOS: 3 days     Sidney Ace, MD [facility]   If 7PM-7AM, please contact night-coverage  06/04/2022, 10:50 AM
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
## 2022-06-04 NOTE — Progress Notes (Signed)
Subjective:  s/p I&D of right knee septic bursitis x 2.  Last one was on 05/21/22.  Patient is homeless and has been non-compliant with oral antibiotics after disharge from the hospital.   Patient reports right knee pain as mild.  Vac dressing has been placed on the knee by wound care.    Objective:   VITALS:   Vitals:   06/04/22 0320 06/04/22 0452 06/04/22 0454 06/04/22 0732  BP: 125/69 112/61  113/68  Pulse: (!) 111 (!) 110  95  Resp: 20 20  18   Temp: 100.1 F (37.8 C) 100 F (37.8 C)  100.2 F (37.9 C)  TempSrc:  Oral  Oral  SpO2: 95% 96%  96%  Weight:   107.1 kg   Height:        PHYSICAL EXAM: Right knee:  Vac dressing in place without any significant drainage. Minimal knee swelling and scant erythema.  No knee effusion.   Patient demonstates active painless ROM of the right knee from 0-90 degrees.  Neurovascular intact Sensation intact distally Intact pulses distally Dorsiflexion/Plantar flexion intact Compartment soft  LABS  Results for orders placed or performed during the hospital encounter of 06/01/22 (from the past 24 hour(s))  Glucose, capillary     Status: Abnormal   Collection Time: 06/03/22  7:33 PM  Result Value Ref Range   Glucose-Capillary 118 (H) 70 - 99 mg/dL  Creatinine, serum     Status: None   Collection Time: 06/04/22  4:25 AM  Result Value Ref Range   Creatinine, Ser 0.75 0.61 - 1.24 mg/dL   GFR, Estimated 08/04/22 >32 mL/min  Glucose, capillary     Status: Abnormal   Collection Time: 06/04/22  7:49 AM  Result Value Ref Range   Glucose-Capillary 140 (H) 70 - 99 mg/dL  CBC with Differential/Platelet     Status: Abnormal   Collection Time: 06/04/22  8:17 AM  Result Value Ref Range   WBC 3.3 (L) 4.0 - 10.5 K/uL   RBC 2.31 (L) 4.22 - 5.81 MIL/uL   Hemoglobin 7.9 (L) 13.0 - 17.0 g/dL   HCT 08/04/22 (L) 25.4 - 27.0 %   MCV 97.8 80.0 - 100.0 fL   MCH 34.2 (H) 26.0 - 34.0 pg   MCHC 35.0 30.0 - 36.0 g/dL   RDW 62.3 (H) 76.2 - 83.1 %   Platelets 33  (L) 150 - 400 K/uL   nRBC 0.0 0.0 - 0.2 %   Neutrophils Relative % 63 %   Neutro Abs 2.1 1.7 - 7.7 K/uL   Lymphocytes Relative 17 %   Lymphs Abs 0.6 (L) 0.7 - 4.0 K/uL   Monocytes Relative 17 %   Monocytes Absolute 0.6 0.1 - 1.0 K/uL   Eosinophils Relative 2 %   Eosinophils Absolute 0.1 0.0 - 0.5 K/uL   Basophils Relative 0 %   Basophils Absolute 0.0 0.0 - 0.1 K/uL   Immature Granulocytes 1 %   Abs Immature Granulocytes 0.03 0.00 - 0.07 K/uL  Comprehensive metabolic panel     Status: Abnormal   Collection Time: 06/04/22  8:17 AM  Result Value Ref Range   Sodium 131 (L) 135 - 145 mmol/L   Potassium 2.8 (L) 3.5 - 5.1 mmol/L   Chloride 103 98 - 111 mmol/L   CO2 23 22 - 32 mmol/L   Glucose, Bld 152 (H) 70 - 99 mg/dL   BUN 11 6 - 20 mg/dL   Creatinine, Ser 08/04/22 0.61 - 1.24 mg/dL   Calcium 7.6 (  L) 8.9 - 10.3 mg/dL   Total Protein 4.9 (L) 6.5 - 8.1 g/dL   Albumin 2.6 (L) 3.5 - 5.0 g/dL   AST 49 (H) 15 - 41 U/L   ALT 30 0 - 44 U/L   Alkaline Phosphatase 67 38 - 126 U/L   Total Bilirubin 2.2 (H) 0.3 - 1.2 mg/dL   GFR, Estimated >54 >27 mL/min   Anion gap 5 5 - 15    No results found.  Assessment/Plan:     Principal Problem:   AMS (altered mental status) Active Problems:   Alcoholic cirrhosis of liver with ascites (HCC)   Hypokalemia   Anemia   Polysubstance abuse (HCC)   Septic arthritis (HCC)   Thrombocytopenia (HCC)   Portal hypertension (HCC)   Bacteremia   No evidence of septic right knee joint.  Continue vac dressing and IV antibiotics as ordered.     Juanell Fairly , MD 06/04/2022, 2:42 PM

## 2022-06-04 NOTE — Progress Notes (Signed)
Date of Admission:  06/01/2022     ID: Marco Spears is a 46 y.o. male  Principal Problem:   AMS (altered mental status) Active Problems:   Alcoholic cirrhosis of liver with ascites (HCC)   Hypokalemia   Anemia   Polysubstance abuse (HCC)   Septic arthritis (HCC)   Thrombocytopenia (HCC)   Portal hypertension (HCC)   Bacteremia    Subjective: Out of icu sleeping   Medications:   Chlorhexidine Gluconate Cloth  6 each Topical Q0600   ferrous sulfate  325 mg Oral Q breakfast   folic acid  1 mg Oral Daily   furosemide  40 mg Oral BID   gabapentin  400 mg Oral TID   methocarbamol  750 mg Oral TID   nicotine  21 mg Transdermal Daily   nystatin  5 mL Oral QID   sodium chloride flush  3 mL Intravenous Q12H   spironolactone  100 mg Oral Daily   thiamine (VITAMIN B1) injection  100 mg Intravenous Daily    Objective: Vital signs in last 24 hours: Temp:  [98.9 F (37.2 C)-100.2 F (37.9 C)] 100.2 F (37.9 C) (08/10 0732) Pulse Rate:  [95-111] 95 (08/10 0732) Resp:  [18-25] 18 (08/10 0732) BP: (112-132)/(61-69) 113/68 (08/10 0732) SpO2:  [95 %-98 %] 96 % (08/10 0732) Weight:  [107.1 kg] 107.1 kg (08/10 0454)    PHYSICAL EXAM:  General:sleeping , wakes up on claling his name- no issues voiced Lungs: b/l air entry- decreased bases Heart: s1s2 Abdomen: Soft, distended- bruising Extremities: rt knee wound covered with wound vac Skin: No rashes or lesions. Or bruising Lymph: Cervical, supraclavicular normal. Neurologic: Grossly non-focal  Lab Results Recent Labs    06/02/22 0410 06/03/22 0359 06/04/22 0425 06/04/22 0817  WBC 9.8  --   --  3.3*  HGB 8.8*  --   --  7.9*  HCT 25.3*  --   --  22.6*  NA 130*  --   --  131*  K 3.5  --   --  2.8*  CL 102  --   --  103  CO2 24  --   --  23  BUN 22*  --   --  11  CREATININE 0.80   < > 0.75 0.63   < > = values in this interval not displayed.   Liver Panel Recent Labs    06/02/22 0202 06/02/22 0410  06/04/22 0817  PROT  --  4.9* 4.9*  ALBUMIN  --  2.0* 2.6*  AST  --  37 49*  ALT  --  31 30  ALKPHOS  --  81 67  BILITOT 5.6* 5.4* 2.2*  BILIDIR 1.5*  --   --   IBILI 4.1*  --   --    Microbiology: 06/01/22 BC- ESBL E.coli WC- Kleb oxytoca , acinetobacter Studies/Results: No results found.   Assessment/Plan: ?ESBL ecoli bacteremia- on meropenem Gram neg seen on the rt knee  klebsiella and acinetobacter  could be  contaminants   Recent strep pyogenes infection of rt prepatellar bursa, then had MRSA infection and now gram neg rod   Encephalopathy from SUD- resolved   Decompensated cirrhosis with ascites Paracentesis done Cell count suggestive of SBP Culture neg so far   Spontaneous bacteria peritonitis- on meropenem Also on vanco Will Dc that   Anemia Thrombocytopenia Hyperbilirubinemia Leucopenia All of the above due to decompensated cirrhosis ? Crystal meth use   Discussed the management with patient and care team  [facility] will cover Friday/Saturday and Sunday Available by phone for urgent issues

## 2022-06-05 ENCOUNTER — Inpatient Hospital Stay: Payer: Self-pay

## 2022-06-05 LAB — CBC WITH DIFFERENTIAL/PLATELET
Abs Immature Granulocytes: 0.04 10*3/uL (ref 0.00–0.07)
Basophils Absolute: 0 10*3/uL (ref 0.0–0.1)
Basophils Relative: 0 %
Eosinophils Absolute: 0.1 10*3/uL (ref 0.0–0.5)
Eosinophils Relative: 3 %
HCT: 23.1 % — ABNORMAL LOW (ref 39.0–52.0)
Hemoglobin: 7.9 g/dL — ABNORMAL LOW (ref 13.0–17.0)
Immature Granulocytes: 1 %
Lymphocytes Relative: 17 %
Lymphs Abs: 0.5 10*3/uL — ABNORMAL LOW (ref 0.7–4.0)
MCH: 33.8 pg (ref 26.0–34.0)
MCHC: 34.2 g/dL (ref 30.0–36.0)
MCV: 98.7 fL (ref 80.0–100.0)
Monocytes Absolute: 0.5 10*3/uL (ref 0.1–1.0)
Monocytes Relative: 18 %
Neutro Abs: 1.8 10*3/uL (ref 1.7–7.7)
Neutrophils Relative %: 61 %
Platelets: 45 10*3/uL — ABNORMAL LOW (ref 150–400)
RBC: 2.34 MIL/uL — ABNORMAL LOW (ref 4.22–5.81)
RDW: 15.9 % — ABNORMAL HIGH (ref 11.5–15.5)
WBC: 2.9 10*3/uL — ABNORMAL LOW (ref 4.0–10.5)
nRBC: 0 % (ref 0.0–0.2)

## 2022-06-05 LAB — BASIC METABOLIC PANEL
Anion gap: 3 — ABNORMAL LOW (ref 5–15)
BUN: 11 mg/dL (ref 6–20)
CO2: 23 mmol/L (ref 22–32)
Calcium: 7.6 mg/dL — ABNORMAL LOW (ref 8.9–10.3)
Chloride: 108 mmol/L (ref 98–111)
Creatinine, Ser: 0.59 mg/dL — ABNORMAL LOW (ref 0.61–1.24)
GFR, Estimated: >60 mL/min (ref >60)
Glucose, Bld: 123 mg/dL — ABNORMAL HIGH (ref 70–99)
Potassium: 3.9 mmol/L (ref 3.5–5.1)
Sodium: 134 mmol/L — ABNORMAL LOW (ref 135–145)

## 2022-06-05 MED ORDER — TRAMADOL HCL 50 MG PO TABS
50.0000 mg | ORAL_TABLET | Freq: Two times a day (BID) | ORAL | Status: DC | PRN
  Filled 2022-06-05 (×2): qty 2

## 2022-06-05 NOTE — Progress Notes (Signed)
Subjective:  Patient more lethargic today but arousable with physical stimuli.  He is s/p I&D of right knee septic bursitis x 2.  Last one was on 05/21/22.  Patient is homeless and has been non-compliant with oral antibiotics after disharge from the hospital.    Objective:   VITALS:   Vitals:   06/04/22 1934 06/05/22 0348 06/05/22 0500 06/05/22 0812  BP: (!) 95/53 100/64  117/74  Pulse: 94 90  (!) 101  Resp: 20 20  18   Temp: 99.5 F (37.5 C) 99.4 F (37.4 C)  99.1 F (37.3 C)  TempSrc: Oral Oral  Oral  SpO2: 98% 92%  98%  Weight:   107.2 kg   Height:        PHYSICAL EXAM: Right knee:  Patient's VAC dressing is in place.  There is minimal output.  Patient has no large effusion.  There is no significant erythema.  Patient has lower leg and foot edema without associated erythema.  He has palpable pedal pulses.  LABS  Results for orders placed or performed during the hospital encounter of 06/01/22 (from the past 24 hour(s))  CBC with Differential/Platelet     Status: Abnormal   Collection Time: 06/05/22  7:55 AM  Result Value Ref Range   WBC 2.9 (L) 4.0 - 10.5 K/uL   RBC 2.34 (L) 4.22 - 5.81 MIL/uL   Hemoglobin 7.9 (L) 13.0 - 17.0 g/dL   HCT 08/05/22 (L) 26.3 - 78.5 %   MCV 98.7 80.0 - 100.0 fL   MCH 33.8 26.0 - 34.0 pg   MCHC 34.2 30.0 - 36.0 g/dL   RDW 88.5 (H) 02.7 - 74.1 %   Platelets 45 (L) 150 - 400 K/uL   nRBC 0.0 0.0 - 0.2 %   Neutrophils Relative % 61 %   Neutro Abs 1.8 1.7 - 7.7 K/uL   Lymphocytes Relative 17 %   Lymphs Abs 0.5 (L) 0.7 - 4.0 K/uL   Monocytes Relative 18 %   Monocytes Absolute 0.5 0.1 - 1.0 K/uL   Eosinophils Relative 3 %   Eosinophils Absolute 0.1 0.0 - 0.5 K/uL   Basophils Relative 0 %   Basophils Absolute 0.0 0.0 - 0.1 K/uL   Immature Granulocytes 1 %   Abs Immature Granulocytes 0.04 0.00 - 0.07 K/uL  Basic metabolic panel     Status: Abnormal   Collection Time: 06/05/22  7:55 AM  Result Value Ref Range   Sodium 134 (L) 135 - 145 mmol/L    Potassium 3.9 3.5 - 5.1 mmol/L   Chloride 108 98 - 111 mmol/L   CO2 23 22 - 32 mmol/L   Glucose, Bld 123 (H) 70 - 99 mg/dL   BUN 11 6 - 20 mg/dL   Creatinine, Ser 08/05/22 (L) 0.61 - 1.24 mg/dL   Calcium 7.6 (L) 8.9 - 10.3 mg/dL   GFR, Estimated 8.67 >67 mL/min   Anion gap 3 (L) 5 - 15    No results found.  Assessment/Plan:     Principal Problem:   AMS (altered mental status) Active Problems:   Alcoholic cirrhosis of liver with ascites (HCC)   Hypokalemia   Anemia   Polysubstance abuse (HCC)   Septic arthritis (HCC)   Thrombocytopenia (HCC)   Portal hypertension (HCC)   Bacteremia  Patient's right knee appears benign.  Yesterday he had no pain with knee movement which indicates that there is no intra-articular infection.  Patient does not have any significant erythema or drainage from his incision.  Continue IV antibiotics and VAC dressing.    Juanell Fairly , MD 06/05/2022, 1:43 PM

## 2022-06-05 NOTE — Consult Note (Signed)
Pharmacy Antibiotic Note  Marco Spears is a 46 y.o. male with medical history including homelessness, polysubstance abuse, alcoholic liver cirrhosis, hepatitis C s/p treatment, Hx DVT on apixaban, recurrent right knee septic bursitis admitted on 06/01/2022 with altered mental status / R knee incision drainage. Patient was recently discharged on linezolid for R knee wound cultures growing MRSA. Pharmacy has been consulted for meropenem dosing.  Plan: Day 4 of antibiotics. Continue Meropenem 1 g IV q8h for ESBL E coli bacteremia  Height: 6\' 3"  (190.5 cm) Weight: 107.2 kg (236 lb 5.3 oz) IBW/kg (Calculated) : 84.5  Temp (24hrs), Avg:99.1 F (37.3 C), Min:98.5 F (36.9 C), Max:99.5 F (37.5 C)  Recent Labs  Lab 06/01/22 1153 06/01/22 1405 06/02/22 0202 06/02/22 0410 06/03/22 0359 06/04/22 0425 06/04/22 0817 06/05/22 0755  WBC 6.2  --   --  9.8  --   --  3.3* 2.9*  CREATININE 0.95  --  0.70 0.80 0.73 0.75 0.63  --   LATICACIDVEN 2.7* 2.0* 1.7 1.6  --   --   --   --      Estimated Creatinine Clearance: 149.5 mL/min (by C-G formula based on SCr of 0.63 mg/dL).    Allergies  Allergen Reactions   Amoxicillin Rash    TOLERATED CEFAZOLIN PRIOR States it makes his skin turn red    Antimicrobials this admission: Ceftriaxone 8/7 x 1 Vancomycin 8/7-8/10  Meropenem 8/8 >>   Dose adjustments this admission: N/A  Microbiology results: 8/7 R Knee Wcx: Klebsiella oxytoca and Acinetobacter spp (per ID, likely contaminants) 8/7 BCx: 1/4 bottles GNR, BCID detected ESBL E coli 8/7 UCx: NG final 8/7 MRSA PCR: negative  Thank you for allowing pharmacy to be a part of this patient's care.  10/7, PharmD 06/05/2022 10:07 AM

## 2022-06-05 NOTE — Procedures (Signed)
PROCEDURE SUMMARY:  Successful ultrasound guided paracentesis from the left lower quadrant.  Yielded 6.0 L of clear yellow fluid.  No immediate complications.  The patient tolerated the procedure well.   Specimen was not sent for labs.  EBL < 5 mL  The patient has required >/=2 paracenteses in a 30 day period and a screening evaluation by the [facility] has been arranged.  Lynnette Caffey, PA-C

## 2022-06-05 NOTE — Progress Notes (Signed)
Pt refuses bladder scans, states that he is "voiding fine."

## 2022-06-05 NOTE — Progress Notes (Signed)
Mobility Specialist - Progress Note    06/05/22 1400  Mobility  Activity Ambulated with assistance in room;Ambulated with assistance to bathroom;Dangled on edge of bed;Stood at bedside  Level of Assistance Standby assist, set-up cues, supervision of patient - no hands on  Assistive Device None  RUE Weight Bearing WBAT  RLE Weight Bearing WBAT  Distance Ambulated (ft) 15 ft  Activity Response Tolerated well  $Mobility charge 1 Mobility    Pt laying supine upon arrival using RA. Completes bed mobility w/ HHA to sit EOB and STS w SBA. Voices pain in leg throughout. Pt ambulates to bathroom SBA for BM and returns to bed with needs in reach.   Clarisa Schools Mobility Specialist 06/05/22, 2:12 PM

## 2022-06-05 NOTE — Progress Notes (Signed)
PROGRESS NOTE    Marco Spears  FTD:[id number] DOB: 02/22/1976 DOA: 06/01/2022 PCP: Pcp, No    Brief Narrative:  46 y.o. male seen in ed brought by EMS for altered mental status where patient was picked up from a home where he had been on meth.  Patient's right knee incision had clear drainage per report patient was intermittently arousable but restless agitated and almost paranoid.  Patient reports pain over the right knee.  Patient gets startled with sound. Patient is altered and somnolent and not oriented and responds to when called but falls asleep immediately.   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  Patient had received Harvoni for his hep C and has had a DVT for which she has taken Eliquis.  Patient has past medical history of allergies to amoxicillin. Patient had diarrhea right knee recurrent septic prepatellar bursitis and has had I&D on 05/07/2022.  Patient had paracentesis on 05/22/2022 with 3.6 L of hazy yellow fluid.  Last paracentesis was on 05/26/2022 where he had 4.8 L removed. Patient is a large hematoma about 12 cm on his lower abdominal area more towards suprapubic area.   Speech remains somewhat tangential and mumbling.  Per GI not necessarily consistent with hepatic encephalopathy.  Has wound VAC in place on right knee.  Is on meropenem.  Hemodynamically stable .  Infectious disease following.  GI signed off as of 8/9.  Home diuretics resumed. Mental status improved on 8/10.  Continues to endorse severe muscle spasms   Assessment & Plan:   Principal Problem:   AMS (altered mental status) Active Problems:   Alcoholic cirrhosis of liver with ascites (HCC)   Anemia   Septic arthritis (HCC)   Hypokalemia   Polysubstance abuse (HCC)   Thrombocytopenia (HCC)   Portal hypertension (HCC)   Bacteremia  Acute metabolic encephalopathy Likely multifactorial.  Patient is a  admitted methamphetamine user.  Also has infection which could be contributing.  Patient remains somewhat lethargic with tangential thought and mumbling.  CT head normal. 8/10: Mental status improved Plan: Avoid nonessential sedatives Monitor mental status carefully Continue antibiotics as below  Septic arthritis right knee ESBL E. coli bacteremia Blood culture positive from 8/8.  Infectious disease on board.  Was previously getting vancomycin and Rocephin.  Changed to meropenem.  Orthopedics consulted.  Incisional wound VAC in place by [facility] team.  Application of knee immobilizer. Plan: Continue meropenem ID following Orthopedics following Continue wound VAC to right knee  Anemia Thrombocytopenia Portal hypertension Spontaneous bacterial peritonitis Hepatic cirrhosis No evidence of bleeding Currently no indication for PRBC or platelet transfusion Will monitor closely for bleed Continue meropenem as above as this will cover for SBP as well Continue home Lasix and Aldactone 8/11: Repeat ultrasound-guided paracentesis ordered  Polysubstance use Counseled patient Avoid narcotics    DVT prophylaxis: SCD Code Status: Full Family Communication: None Disposition Plan: Status is: Inpatient Remains inpatient appropriate because: Acute metabolic encephalopathy, sepsis, bacteremia   Level of care: Med-Surg  Consultants:  ID  Procedures:  Paracentesis Knee arthrocentesis  Antimicrobials: Meropenem   Subjective: Seen and examined.  Endorsing abdominal pain and distention this morning  Objective: Vitals:   06/04/22 1934 06/05/22 0348 06/05/22 0500 06/05/22 0812  BP: (!) 95/53 100/64  117/74  Pulse: 94 90  (!) 101  Resp: 20 20  18   Temp: 99.5 F (37.5 C) 99.4 F (37.4 C)  99.1 F (37.3  C)  TempSrc: Oral Oral  Oral  SpO2: 98% 92%  98%  Weight:   107.2 kg   Height:        Intake/Output Summary (Last 24 hours) at 06/05/2022 1149 Last data filed at 06/05/2022  0950 Gross per 24 hour  Intake 203 ml  Output --  Net 203 ml   Filed Weights   06/01/22 2050 06/04/22 0454 06/05/22 0500  Weight: 107.7 kg 107.1 kg 107.2 kg    Examination:  General exam: Appears uncomfortable Respiratory system: Lungs clear.  Normal work of breathing.  Room air Cardiovascular system: S1-S2, RRR, no murmurs, no pedal edema Gastrointestinal system: Soft, distended, tender to palpation diffusely Central nervous system: Alert, oriented x 3, no focal deficits Extremities: Unable to assess Skin: No rashes, lesions or ulcers Psychiatry: Judgement and insight appear impaired. Mood & affect flattened.     Data Reviewed: I have personally reviewed following labs and imaging studies  CBC: Recent Labs  Lab 06/01/22 1153 06/02/22 0202 06/02/22 0410 06/04/22 0817 06/05/22 0755  WBC 6.2  --  9.8 3.3* 2.9*  NEUTROABS 5.5  --   --  2.1 1.8  HGB 10.7* 9.0* 8.8* 7.9* 7.9*  HCT 31.6* 26.1* 25.3* 22.6* 23.1*  MCV 98.8  --  98.4 97.8 98.7  PLT 55*  --  35* 33* 45*   Basic Metabolic Panel: Recent Labs  Lab 06/01/22 1153 06/02/22 0202 06/02/22 0410 06/03/22 0359 06/04/22 0425 06/04/22 0817 06/05/22 0755  NA 131* 132* 130*  --   --  131* 134*  K 3.4* 3.6 3.5  --   --  2.8* 3.9  CL 100 103 102  --   --  103 108  CO2 24 24 24   --   --  23 23  GLUCOSE 123* 109* 107*  --   --  152* 123*  BUN 22* 21* 22*  --   --  11 11  CREATININE 0.95 0.70 0.80 0.73 0.75 0.63 0.59*  CALCIUM 8.2* 7.8* 7.7*  --   --  7.6* 7.6*  MG  --  1.7  --   --   --   --   --    GFR: Estimated Creatinine Clearance: 149.5 mL/min (A) (by C-G formula based on SCr of 0.59 mg/dL (L)). Liver Function Tests: Recent Labs  Lab 06/01/22 1153 06/02/22 0202 06/02/22 0410 06/04/22 0817  AST 50*  --  37 49*  ALT 38  --  31 30  ALKPHOS 115  --  81 67  BILITOT 5.1* 5.6* 5.4* 2.2*  PROT 6.0*  --  4.9* 4.9*  ALBUMIN 2.4*  --  2.0* 2.6*   No results for input(s): "LIPASE", "AMYLASE" in the last 168  hours. Recent Labs  Lab 06/01/22 1153 06/02/22 1058  AMMONIA 33 36*   Coagulation Profile: Recent Labs  Lab 06/01/22 1153  INR 1.5*   Cardiac Enzymes: Recent Labs  Lab 06/03/22 0359  CKTOTAL 59   BNP (last 3 results) No results for input(s): "PROBNP" in the last 8760 hours. HbA1C: No results for input(s): "HGBA1C" in the last 72 hours. CBG: Recent Labs  Lab 06/01/22 2050 06/03/22 1933 06/04/22 0749  GLUCAP 108* 118* 140*   Lipid Profile: No results for input(s): "CHOL", "HDL", "LDLCALC", "TRIG", "CHOLHDL", "LDLDIRECT" in the last 72 hours. Thyroid Function Tests: No results for input(s): "TSH", "T4TOTAL", "FREET4", "T3FREE", "THYROIDAB" in the last 72 hours.  Anemia Panel: No results for input(s): "VITAMINB12", "FOLATE", "FERRITIN", "TIBC", "IRON", "RETICCTPCT" in the  last 72 hours. Sepsis Labs: Recent Labs  Lab 06/01/22 1153 06/01/22 1405 06/02/22 0202 06/02/22 0410 06/03/22 0359  PROCALCITON <0.10  --   --  2.16 1.79  LATICACIDVEN 2.7* 2.0* 1.7 1.6  --     Recent Results (from the past 240 hour(s))  Resp Panel by RT-PCR (Flu A&B, Covid) Anterior Nasal Swab     Status: None   Collection Time: 06/01/22 12:34 PM   Specimen: Anterior Nasal Swab  Result Value Ref Range Status   SARS Coronavirus 2 by RT PCR NEGATIVE NEGATIVE Final    Comment: (NOTE) SARS-CoV-2 target nucleic acids are NOT DETECTED.  The SARS-CoV-2 RNA is generally detectable in upper respiratory specimens during the acute phase of infection. The lowest concentration of SARS-CoV-2 viral copies this assay can detect is 138 copies/mL. A negative result does not preclude SARS-Cov-2 infection and should not be used as the sole basis for treatment or other patient management decisions. A negative result may occur with  improper specimen collection/handling, submission of specimen other than nasopharyngeal swab, presence of viral mutation(s) within the areas targeted by this assay, and  inadequate number of viral copies(<138 copies/mL). A negative result must be combined with clinical observations, patient history, and epidemiological information. The expected result is Negative.  Fact Sheet for Patients:  BloggerCourse.com  Fact Sheet for Healthcare Providers:  SeriousBroker.it  This test is no t yet approved or cleared by the [place] FDA and  has been authorized for detection and/or diagnosis of SARS-CoV-2 by FDA under an Emergency Use Authorization (EUA). This EUA will remain  in effect (meaning this test can be used) for the duration of the COVID-19 declaration under Section 564(b)(1) of the Act, 21 U.S.C.section 360bbb-3(b)(1), unless the authorization is terminated  or revoked sooner.       Influenza A by PCR NEGATIVE NEGATIVE Final   Influenza B by PCR NEGATIVE NEGATIVE Final    Comment: (NOTE) The Xpert Xpress SARS-CoV-2/FLU/RSV plus assay is intended as an aid in the diagnosis of influenza from Nasopharyngeal swab specimens and should not be used as a sole basis for treatment. Nasal washings and aspirates are unacceptable for Xpert Xpress SARS-CoV-2/FLU/RSV testing.  Fact Sheet for Patients: BloggerCourse.com  Fact Sheet for Healthcare Providers: SeriousBroker.it  This test is not yet approved or cleared by the [place] FDA and has been authorized for detection and/or diagnosis of SARS-CoV-2 by FDA under an Emergency Use Authorization (EUA). This EUA will remain in effect (meaning this test can be used) for the duration of the COVID-19 declaration under Section 564(b)(1) of the Act, 21 U.S.C. section 360bbb-3(b)(1), unless the authorization is terminated or revoked.  Performed at [facility], [address] [place], [place] [address]   Blood Culture (routine x 2)     Status: Abnormal   Collection Time: 06/01/22 12:34 PM    Specimen: BLOOD  Result Value Ref Range Status   Specimen Description   Final    BLOOD Blood Culture adequate volume Performed at [facility], [address] [place], [place] [address]    Special Requests   Final    BOTTLES DRAWN AEROBIC AND ANAEROBIC LEFT ANTECUBITAL Performed at [facility], [address] [place], [place] [address]    Culture  Setup Time   Final    GRAM NEGATIVE RODS ANAEROBIC BOTTLE ONLY CRITICAL RESULT CALLED TO, READ BACK BY AND VERIFIED WITH: NATHAN BELUE AT 0017 ON 06/02/22 BY SS    Culture (A)  Final  ESCHERICHIA COLI Confirmed Extended Spectrum Beta-Lactamase Producer (ESBL).  In bloodstream infections from ESBL organisms, carbapenems are preferred over piperacillin/tazobactam. They are shown to have a lower risk of mortality.    Report Status 06/04/2022 FINAL  Final   Organism ID, Bacteria ESCHERICHIA COLI  Final      Susceptibility   Escherichia coli - MIC*    AMPICILLIN >=32 RESISTANT Resistant     CEFAZOLIN >=64 RESISTANT Resistant     CEFEPIME 16 RESISTANT Resistant     CEFTAZIDIME RESISTANT Resistant     CEFTRIAXONE >=64 RESISTANT Resistant     CIPROFLOXACIN >=4 RESISTANT Resistant     GENTAMICIN <=1 SENSITIVE Sensitive     IMIPENEM <=0.25 SENSITIVE Sensitive     TRIMETH/SULFA >=320 RESISTANT Resistant     AMPICILLIN/SULBACTAM 16 INTERMEDIATE Intermediate     PIP/TAZO <=4 SENSITIVE Sensitive     * ESCHERICHIA COLI  Blood Culture (routine x 2)     Status: None (Preliminary result)   Collection Time: 06/01/22 12:34 PM   Specimen: BLOOD  Result Value Ref Range Status   Specimen Description   Final    BLOOD Blood Culture results may not be optimal due to an inadequate volume of blood received in culture bottles   Special Requests BOTTLES DRAWN AEROBIC AND ANAEROBIC WRIST  Final   Culture   Final    NO GROWTH 4 DAYS Performed at [facility], [address] [place], [place] [address]    Report Status PENDING   Incomplete  Blood Culture ID Panel (Reflexed)     Status: Abnormal   Collection Time: 06/01/22 12:34 PM  Result Value Ref Range Status   Enterococcus faecalis NOT DETECTED NOT DETECTED Final   Enterococcus Faecium NOT DETECTED NOT DETECTED Final   Listeria monocytogenes NOT DETECTED NOT DETECTED Final   Staphylococcus species NOT DETECTED NOT DETECTED Final   Staphylococcus aureus (BCID) NOT DETECTED NOT DETECTED Final   Staphylococcus epidermidis NOT DETECTED NOT DETECTED Final   Staphylococcus lugdunensis NOT DETECTED NOT DETECTED Final   Streptococcus species NOT DETECTED NOT DETECTED Final   Streptococcus agalactiae NOT DETECTED NOT DETECTED Final   Streptococcus pneumoniae NOT DETECTED NOT DETECTED Final   Streptococcus pyogenes NOT DETECTED NOT DETECTED Final   A.calcoaceticus-baumannii NOT DETECTED NOT DETECTED Final   Bacteroides fragilis NOT DETECTED NOT DETECTED Final   Enterobacterales DETECTED (A) NOT DETECTED Final    Comment: Enterobacterales represent a large order of gram negative bacteria, not a single organism. CRITICAL RESULT CALLED TO, READ BACK BY AND VERIFIED WITH: NATHAN BELUE AT 0017 ON 06/02/22 BY SS    Enterobacter cloacae complex NOT DETECTED NOT DETECTED Final   Escherichia coli DETECTED (A) NOT DETECTED Final    Comment: CRITICAL RESULT CALLED TO, READ BACK BY AND VERIFIED WITH: NATHAN BELUE AT 0017 ON 06/02/22 BY SS    Klebsiella aerogenes NOT DETECTED NOT DETECTED Final   Klebsiella oxytoca NOT DETECTED NOT DETECTED Final   Klebsiella pneumoniae NOT DETECTED NOT DETECTED Final   Proteus species NOT DETECTED NOT DETECTED Final   Salmonella species NOT DETECTED NOT DETECTED Final   Serratia marcescens NOT DETECTED NOT DETECTED Final   Haemophilus influenzae NOT DETECTED NOT DETECTED Final   Neisseria meningitidis NOT DETECTED NOT DETECTED Final   Pseudomonas aeruginosa NOT DETECTED NOT DETECTED Final   Stenotrophomonas maltophilia NOT DETECTED NOT  DETECTED Final   Candida albicans NOT DETECTED NOT DETECTED Final   Candida auris NOT DETECTED NOT DETECTED Final   Candida glabrata  NOT DETECTED NOT DETECTED Final   Candida krusei NOT DETECTED NOT DETECTED Final   Candida parapsilosis NOT DETECTED NOT DETECTED Final   Candida tropicalis NOT DETECTED NOT DETECTED Final   Cryptococcus neoformans/gattii NOT DETECTED NOT DETECTED Final   CTX-M ESBL DETECTED (A) NOT DETECTED Final    Comment: CRITICAL RESULT CALLED TO, READ BACK BY AND VERIFIED WITH: NATHAN BELUE AT 0017 ON 06/02/22 BY SS (NOTE) Extended spectrum beta-lactamase detected. Recommend a carbapenem as initial therapy.      Carbapenem resistance IMP NOT DETECTED NOT DETECTED Final   Carbapenem resistance KPC NOT DETECTED NOT DETECTED Final   Carbapenem resistance NDM NOT DETECTED NOT DETECTED Final   Carbapenem resist OXA 48 LIKE NOT DETECTED NOT DETECTED Final   Carbapenem resistance VIM NOT DETECTED NOT DETECTED Final    Comment: Performed at [facility], [address], [place], [place] [address]  Urine Culture     Status: None   Collection Time: 06/01/22  1:06 PM   Specimen: In/Out Cath Urine  Result Value Ref Range Status   Specimen Description   Final    IN/OUT CATH URINE Performed at [facility], [address] [place], [place] [address]    Special Requests   Final    NONE Performed at [facility], [address] [place], [place] [address]    Culture   Final    NO GROWTH Performed at [facility], [place] [address], [place], [place] [address]    Report Status 06/02/2022 FINAL  Final  Aerobic Culture w Gram Stain (superficial specimen)     Status: None (Preliminary result)   Collection Time: 06/01/22  4:35 PM   Specimen: Wound  Result Value Ref Range Status   Specimen Description   Final    WOUND Performed at [facility], [address] [place], [place] [address]    Special Requests   Final    RT  KNEE Performed at [facility], [address] [place], [place] [address]    Gram Stain   Final    RARE WBC PRESENT, PREDOMINANTLY PMN RARE GRAM NEGATIVE RODS    Culture   Final    FEW KLEBSIELLA OXYTOCA FEW ACINETOBACTER CALCOACETICUS/BAUMANNII COMPLEX RARE PSEUDOMONAS AERUGINOSA Confirmed Extended Spectrum Beta-Lactamase Producer (ESBL).  In bloodstream infections from ESBL organisms, carbapenems are preferred over piperacillin/tazobactam. They are shown to have a lower risk of mortality. FOR KLEBSIELLA Performed at [facility], [place] [address] [place], [place] [address]    Report Status PENDING  Incomplete   Organism ID, Bacteria ACINETOBACTER CALCOACETICUS/BAUMANNII COMPLEX  Final   Organism ID, Bacteria KLEBSIELLA OXYTOCA  Final   Organism ID, Bacteria PSEUDOMONAS AERUGINOSA  Final      Susceptibility   Acinetobacter calcoaceticus/baumannii complex - MIC*    CEFTAZIDIME 8 SENSITIVE Sensitive     CIPROFLOXACIN 1 SENSITIVE Sensitive     GENTAMICIN <=1 SENSITIVE Sensitive     IMIPENEM <=0.25 SENSITIVE Sensitive     PIP/TAZO 8 SENSITIVE Sensitive     TRIMETH/SULFA >=320 RESISTANT Resistant     AMPICILLIN/SULBACTAM <=2 SENSITIVE Sensitive     * FEW ACINETOBACTER CALCOACETICUS/BAUMANNII COMPLEX   Klebsiella oxytoca - MIC*    AMPICILLIN >=32 RESISTANT Resistant     CEFAZOLIN >=64 RESISTANT Resistant     CEFEPIME >=32 RESISTANT Resistant     CEFTAZIDIME RESISTANT Resistant     CEFTRIAXONE >=64 RESISTANT Resistant     CIPROFLOXACIN 1 RESISTANT Resistant  GENTAMICIN >=16 RESISTANT Resistant     IMIPENEM <=0.25 SENSITIVE Sensitive     TRIMETH/SULFA >=320 RESISTANT Resistant     AMPICILLIN/SULBACTAM >=32 RESISTANT Resistant     PIP/TAZO 16 SENSITIVE Sensitive     * FEW KLEBSIELLA OXYTOCA   Pseudomonas aeruginosa - MIC*    CEFTAZIDIME 4 SENSITIVE Sensitive     CIPROFLOXACIN <=0.25 SENSITIVE Sensitive     GENTAMICIN <=1 SENSITIVE Sensitive     IMIPENEM 2  SENSITIVE Sensitive     PIP/TAZO 8 SENSITIVE Sensitive     CEFEPIME 2 SENSITIVE Sensitive     * RARE PSEUDOMONAS AERUGINOSA  MRSA Next Gen by PCR, Nasal     Status: None   Collection Time: 06/01/22  9:00 PM   Specimen: Nasal Mucosa; Nasal Swab  Result Value Ref Range Status   MRSA by PCR Next Gen NOT DETECTED NOT DETECTED Final    Comment: (NOTE) The GeneXpert MRSA Assay (FDA approved for NASAL specimens only), is one component of a comprehensive MRSA colonization surveillance program. It is not intended to diagnose MRSA infection nor to guide or monitor treatment for MRSA infections. Test performance is not FDA approved in patients less than 61 years old. Performed at [facility], [address] [place], [place] [address]   Aerobic/Anaerobic Culture w Gram Stain (surgical/deep wound)     Status: None (Preliminary result)   Collection Time: 06/02/22  2:37 PM   Specimen: PATH Cytology Peritoneal fluid  Result Value Ref Range Status   Specimen Description   Final    PERITONEAL Performed at [facility], [address], [place], [place] [address]    Special Requests   Final    NONE Performed at [facility], [address] [place], [place] [address]    Gram Stain   Final    FEW WBC PRESENT,BOTH PMN AND MONONUCLEAR NO ORGANISMS SEEN    Culture   Final    NO GROWTH 3 DAYS NO ANAEROBES ISOLATED; CULTURE IN PROGRESS FOR 5 DAYS Performed at [facility], [place] [address], [place], [place] [address]    Report Status PENDING  Incomplete  Acid Fast Smear (AFB)     Status: None   Collection Time: 06/02/22  2:37 PM   Specimen: PATH Cytology Peritoneal fluid  Result Value Ref Range Status   AFB Specimen Processing Concentration  Final   Acid Fast Smear Negative  Final    Comment: (NOTE) Performed At: [facility] [place], [place] [id number] Rush Farmer MD [id number]    Source (AFB) PERI  Final    Comment: Performed  at [facility], [address] [place],  [address]         Radiology Studies: No results found.      Scheduled Meds:  Chlorhexidine Gluconate Cloth  6 each Topical Q0600   ferrous sulfate  325 mg Oral Q breakfast   folic acid  1 mg Oral Daily   furosemide  40 mg Oral BID   gabapentin  400 mg Oral TID   methocarbamol  750 mg Oral TID   nicotine  21 mg Transdermal Daily   nystatin  5 mL Oral QID   sodium chloride flush  3 mL Intravenous Q12H   spironolactone  100 mg Oral Daily   thiamine (VITAMIN B1) injection  100 mg Intravenous Daily   Continuous Infusions:  albumin human     meropenem (MERREM) IV 1 g (06/05/22 0957)  LOS: 4 days     Sidney Ace, MD [facility]   If 7PM-7AM, please contact night-coverage  06/05/2022, 11:49 AM

## 2022-06-06 LAB — CULTURE, BLOOD (ROUTINE X 2): Culture: NO GROWTH

## 2022-06-06 MED ORDER — GABAPENTIN 300 MG PO CAPS
300.0000 mg | ORAL_CAPSULE | Freq: Three times a day (TID) | ORAL | Status: DC
  Administered 2022-06-06 – 2022-06-15 (×28): 300 mg via ORAL
  Filled 2022-06-06 (×28): qty 1

## 2022-06-06 MED ORDER — ALBUMIN HUMAN 25 % IV SOLN
12.5000 g | Freq: Once | INTRAVENOUS | Status: AC
  Administered 2022-06-06: 12.5 g via INTRAVENOUS
  Filled 2022-06-06: qty 50

## 2022-06-06 MED ORDER — SODIUM CHLORIDE 0.9 % IV SOLN: INTRAVENOUS | Status: DC | PRN

## 2022-06-06 NOTE — Progress Notes (Signed)
  Subjective:  s/p I&D x2 for septic right knee prepatellar bursitis.   Patient reports right knee pain as mild.  Patient mentions he is having paresthesias in both feet over the last 3 days.  Patient states he was on Neurontin in the past for what sounds like neuropathy although the patient was not sure.  Objective:   VITALS:   Vitals:   06/05/22 1615 06/05/22 1948 06/06/22 0417 06/06/22 0751  BP: 117/75 107/63 (!) 99/53 112/70  Pulse: 95 96 100 92  Resp:  20 16 20   Temp:  99.3 F (37.4 C) 99.9 F (37.7 C) 99.3 F (37.4 C)  TempSrc:  Oral Oral Oral  SpO2: 99% 100% 98% 98%  Weight:   110.7 kg   Height:        PHYSICAL EXAM: Right knee:  Patient can painlessly flex and extend his right knee.  No evidence of intra-articular infection. Neurovascular intact Sensation intact distally, with paresthesias Intact pulses distally Dorsiflexion/Plantar flexion intact VAC dressing in place with small amount of serous drainage in the collection container. No cellulitis present over the anterior right knee Compartment soft  LABS  No results found for this or any previous visit (from the past 24 hour(s)).  No results found.  Assessment/Plan:     Principal Problem:   AMS (altered mental status) Active Problems:   Alcoholic cirrhosis of liver with ascites (HCC)   Hypokalemia   Anemia   Polysubstance abuse (HCC)   Septic arthritis (HCC)   Thrombocytopenia (HCC)   Portal hypertension (HCC)   Bacteremia  Patient improving from an orthopedic standpoint.  Continue VAC dressing and IV antibiotics.  He may weight-bear as tolerated in the right lower extremity.  He has no limitations to knee range of motion.    , MD 06/06/2022, 10:13 AM

## 2022-06-06 NOTE — Progress Notes (Signed)
PROGRESS NOTE    Marco Spears  ZOX:[id number] DOB: 12/27/1975 DOA: 06/01/2022 PCP: Pcp, No    Brief Narrative:  46 y.o. male seen in ed brought by EMS for altered mental status where patient was picked up from a home where he had been on meth.  Patient's right knee incision had clear drainage per report patient was intermittently arousable but restless agitated and almost paranoid.  Patient reports pain over the right knee.  Patient gets startled with sound. Patient is altered and somnolent and not oriented and responds to when called but falls asleep immediately.   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  Patient had received Harvoni for his hep C and has had a DVT for which she has taken Eliquis.  Patient has past medical history of allergies to amoxicillin. Patient had diarrhea right knee recurrent septic prepatellar bursitis and has had I&D on 05/07/2022.  Patient had paracentesis on 05/22/2022 with 3.6 L of hazy yellow fluid.  Last paracentesis was on 05/26/2022 where he had 4.8 L removed. Patient is a large hematoma about 12 cm on his lower abdominal area more towards suprapubic area.   Speech remains somewhat tangential and mumbling.  Per GI not necessarily consistent with hepatic encephalopathy.  Has wound VAC in place on right knee.  Is on meropenem.  Hemodynamically stable .  Infectious disease following.  GI signed off as of 8/9.  Home diuretics resumed. Mental status improved on 8/10.  Continues to endorse severe muscle spasms Status post ultrasound-guided paracentesis 8/11.  6 L fluid liberated   Assessment & Plan:   Principal Problem:   AMS (altered mental status) Active Problems:   Alcoholic cirrhosis of liver with ascites (HCC)   Anemia   Septic arthritis (HCC)   Hypokalemia   Polysubstance abuse (HCC)   Thrombocytopenia (HCC)   Portal hypertension (HCC)    Bacteremia  Acute metabolic encephalopathy Likely multifactorial.  Patient is a admitted methamphetamine user.  Also has infection which could be contributing.  Patient remains somewhat lethargic with tangential thought and mumbling.  CT head normal. 8/10: Mental status improved.  Appears to be back to baseline Plan: Avoid nonessential sedatives Monitor mental status carefully Antibiotics as below  Septic arthritis right knee ESBL E. coli bacteremia Blood culture positive from 8/8.  Infectious disease on board.  Was previously getting vancomycin and Rocephin.  Changed to meropenem.  Orthopedics consulted.  Incisional wound VAC in place by [facility] team.  Application of knee immobilizer. Plan: Continue meropenem ID following Orthopedics following, improving from orthopedic standpoint VAC dressing to right knee  Anemia Thrombocytopenia Portal hypertension Spontaneous bacterial peritonitis Hepatic cirrhosis No evidence of bleeding Currently no indication for PRBC or platelet transfusion Will monitor closely for bleed Continue meropenem as above as this will cover for SBP as well Continue home Lasix and Aldactone 8/11: Repeat ultrasound-guided paracentesis ordered 8/11: Ultrasound-guided paracentesis.  6 L fluid removed  Polysubstance use Counseled patient Avoid narcotics    DVT prophylaxis: SCD Code Status: Full Family Communication: None Disposition Plan: Status is: Inpatient Remains inpatient appropriate because: Right knee septic arthritis on IV antibiotics and wound VAC   Level of care: Med-Surg  Consultants:  ID  Procedures:  Paracentesis Knee arthrocentesis  Antimicrobials: Meropenem   Subjective: Seen and examined.  More comfortable this morning.  Endorses some lethargy  Objective: Vitals:   06/05/22 1615 06/05/22 1948 06/06/22 0417 06/06/22 8119  BP: 117/75 107/63 (!) 99/53 112/70  Pulse: 95 96 100 92  Resp:  Temp:  99.3 F (37.4 C) 99.9  F (37.7 C) 99.3 F (37.4 C)  TempSrc:  Oral Oral Oral  SpO2: 99% 100% 98% 98%  Weight:   110.7 kg   Height:        Intake/Output Summary (Last 24 hours) at 06/06/2022 1114 Last data filed at 06/06/2022 0700 Gross per 24 hour  Intake 661.09 ml  Output 1700 ml  Net -1038.91 ml   Filed Weights   06/04/22 0454 06/05/22 0500 06/06/22 0417  Weight: 107.1 kg 107.2 kg 110.7 kg    Examination:  General exam: NAD Respiratory system: Lungs clear.  Normal work of breathing.  Room air Cardiovascular system: S1-S2, RRR, no murmurs, no pedal edema Gastrointestinal system: Soft, mild distention, nontender Central nervous system: Alert, oriented x 3, no focal deficits Extremities: Unable to assess Skin: No rashes, lesions or ulcers Psychiatry: Judgement and insight appear impaired. Mood & affect flattened.     Data Reviewed: I have personally reviewed following labs and imaging studies  CBC: Recent Labs  Lab 06/01/22 1153 06/02/22 0202 06/02/22 0410 06/04/22 0817 06/05/22 0755  WBC 6.2  --  9.8 3.3* 2.9*  NEUTROABS 5.5  --   --  2.1 1.8  HGB 10.7* 9.0* 8.8* 7.9* 7.9*  HCT 31.6* 26.1* 25.3* 22.6* 23.1*  MCV 98.8  --  98.4 97.8 98.7  PLT 55*  --  35* 33* 45*   Basic Metabolic Panel: Recent Labs  Lab 06/01/22 1153 06/02/22 0202 06/02/22 0410 06/03/22 0359 06/04/22 0425 06/04/22 0817 06/05/22 0755  NA 131* 132* 130*  --   --  131* 134*  K 3.4* 3.6 3.5  --   --  2.8* 3.9  CL 100 103 102  --   --  103 108  CO2 --   --  23 23  GLUCOSE 123* 109* 107*  --   --  152* 123*  BUN 22* 21* 22*  --   --  11 11  CREATININE 0.95 0.70 0.80 0.73 0.75 0.63 0.59*  CALCIUM 8.2* 7.8* 7.7*  --   --  7.6* 7.6*  MG  --  1.7  --   --   --   --   --    GFR: Estimated Creatinine Clearance: 151.7 mL/min (A) (by C-G formula based on SCr of 0.59 mg/dL (L)). Liver Function Tests: Recent Labs  Lab 06/01/22 1153 06/02/22 0202 06/02/22 0410 06/04/22 0817  AST 50*  --  37 49*  ALT  38  --  31 30  ALKPHOS 115  --  81 67  BILITOT 5.1* 5.6* 5.4* 2.2*  PROT 6.0*  --  4.9* 4.9*  ALBUMIN 2.4*  --  2.0* 2.6*   No results for input(s): "LIPASE", "AMYLASE" in the last 168 hours. Recent Labs  Lab 06/01/22 1153 06/02/22 1058  AMMONIA 33 36*   Coagulation Profile: Recent Labs  Lab 06/01/22 1153  INR 1.5*   Cardiac Enzymes: Recent Labs  Lab 06/03/22 0359  CKTOTAL 59   BNP (last 3 results) No results for input(s): "PROBNP" in the last 8760 hours. HbA1C: No results for input(s): "HGBA1C" in the last 72 hours. CBG: Recent Labs  Lab 06/01/22 2050 06/03/22 1933 06/04/22 0749  GLUCAP 108* 118* 140*   Lipid Profile: No results for input(s): "CHOL", "HDL", "LDLCALC", "TRIG", "CHOLHDL", "LDLDIRECT" in the last 72 hours. Thyroid Function  Tests: No results for input(s): "TSH", "T4TOTAL", "FREET4", "T3FREE", "THYROIDAB" in the last 72 hours.  Anemia Panel: No results for input(s): "VITAMINB12", "FOLATE", "FERRITIN", "TIBC", "IRON", "RETICCTPCT" in the last 72 hours. Sepsis Labs: Recent Labs  Lab 06/01/22 1153 06/01/22 1405 06/02/22 0202 06/02/22 0410 06/03/22 0359  PROCALCITON <0.10  --   --  2.16 1.79  LATICACIDVEN 2.7* 2.0* 1.7 1.6  --     Recent Results (from the past 240 hour(s))  Resp Panel by RT-PCR (Flu A&B, Covid) Anterior Nasal Swab     Status: None   Collection Time: 06/01/22 12:34 PM   Specimen: Anterior Nasal Swab  Result Value Ref Range Status   SARS Coronavirus 2 by RT PCR NEGATIVE NEGATIVE Final    Comment: (NOTE) SARS-CoV-2 target nucleic acids are NOT DETECTED.  The SARS-CoV-2 RNA is generally detectable in upper respiratory specimens during the acute phase of infection. The lowest concentration of SARS-CoV-2 viral copies this assay can detect is 138 copies/mL. A negative result does not preclude SARS-Cov-2 infection and should not be used as the sole basis for treatment or other patient management decisions. A negative result may  occur with  improper specimen collection/handling, submission of specimen other than nasopharyngeal swab, presence of viral mutation(s) within the areas targeted by this assay, and inadequate number of viral copies(<138 copies/mL). A negative result must be combined with clinical observations, patient history, and epidemiological information. The expected result is Negative.  Fact Sheet for Patients:  BloggerCourse.com  Fact Sheet for Healthcare Providers:  SeriousBroker.it  This test is no t yet approved or cleared by the [place] FDA and  has been authorized for detection and/or diagnosis of SARS-CoV-2 by FDA under an Emergency Use Authorization (EUA). This EUA will remain  in effect (meaning this test can be used) for the duration of the COVID-19 declaration under Section 564(b)(1) of the Act, 21 U.S.C.section 360bbb-3(b)(1), unless the authorization is terminated  or revoked sooner.       Influenza A by PCR NEGATIVE NEGATIVE Final   Influenza B by PCR NEGATIVE NEGATIVE Final    Comment: (NOTE) The Xpert Xpress SARS-CoV-2/FLU/RSV plus assay is intended as an aid in the diagnosis of influenza from Nasopharyngeal swab specimens and should not be used as a sole basis for treatment. Nasal washings and aspirates are unacceptable for Xpert Xpress SARS-CoV-2/FLU/RSV testing.  Fact Sheet for Patients: BloggerCourse.com  Fact Sheet for Healthcare Providers: SeriousBroker.it  This test is not yet approved or cleared by the [place] FDA and has been authorized for detection and/or diagnosis of SARS-CoV-2 by FDA under an Emergency Use Authorization (EUA). This EUA will remain in effect (meaning this test can be used) for the duration of the COVID-19 declaration under Section 564(b)(1) of the Act, 21 U.S.C. section 360bbb-3(b)(1), unless the authorization is terminated  or revoked.  Performed at [facility], [address] [place], [place] [address]   Blood Culture (routine x 2)     Status: Abnormal   Collection Time: 06/01/22 12:34 PM   Specimen: BLOOD  Result Value Ref Range Status   Specimen Description   Final    BLOOD Blood Culture adequate volume Performed at [facility], [address], [place], [place] [address]    Special Requests   Final    BOTTLES DRAWN AEROBIC AND ANAEROBIC LEFT ANTECUBITAL Performed at [facility], [address] [place], [place] [address]    Culture  Setup Time   Final    Romie Minus  NEGATIVE RODS ANAEROBIC BOTTLE ONLY CRITICAL RESULT CALLED TO, READ BACK BY AND VERIFIED WITH: NATHAN BELUE AT 0017 ON 06/02/22 BY SS    Culture (A)  Final    ESCHERICHIA COLI Confirmed Extended Spectrum Beta-Lactamase Producer (ESBL).  In bloodstream infections from ESBL organisms, carbapenems are preferred over piperacillin/tazobactam. They are shown to have a lower risk of mortality.    Report Status 06/04/2022 FINAL  Final   Organism ID, Bacteria ESCHERICHIA COLI  Final      Susceptibility   Escherichia coli - MIC*    AMPICILLIN >=32 RESISTANT Resistant     CEFAZOLIN >=64 RESISTANT Resistant     CEFEPIME 16 RESISTANT Resistant     CEFTAZIDIME RESISTANT Resistant     CEFTRIAXONE >=64 RESISTANT Resistant     CIPROFLOXACIN >=4 RESISTANT Resistant     GENTAMICIN <=1 SENSITIVE Sensitive     IMIPENEM <=0.25 SENSITIVE Sensitive     TRIMETH/SULFA >=320 RESISTANT Resistant     AMPICILLIN/SULBACTAM 16 INTERMEDIATE Intermediate     PIP/TAZO <=4 SENSITIVE Sensitive     * ESCHERICHIA COLI  Blood Culture (routine x 2)     Status: None   Collection Time: 06/01/22 12:34 PM   Specimen: BLOOD  Result Value Ref Range Status   Specimen Description   Final    BLOOD Blood Culture results may not be optimal due to an inadequate volume of blood received in culture bottles   Special Requests BOTTLES DRAWN AEROBIC AND  ANAEROBIC WRIST  Final   Culture   Final    NO GROWTH 5 DAYS Performed at [facility], [address] [place], [place] [address]    Report Status 06/06/2022 FINAL  Final  Blood Culture ID Panel (Reflexed)     Status: Abnormal   Collection Time: 06/01/22 12:34 PM  Result Value Ref Range Status   Enterococcus faecalis NOT DETECTED NOT DETECTED Final   Enterococcus Faecium NOT DETECTED NOT DETECTED Final   Listeria monocytogenes NOT DETECTED NOT DETECTED Final   Staphylococcus species NOT DETECTED NOT DETECTED Final   Staphylococcus aureus (BCID) NOT DETECTED NOT DETECTED Final   Staphylococcus epidermidis NOT DETECTED NOT DETECTED Final   Staphylococcus lugdunensis NOT DETECTED NOT DETECTED Final   Streptococcus species NOT DETECTED NOT DETECTED Final   Streptococcus agalactiae NOT DETECTED NOT DETECTED Final   Streptococcus pneumoniae NOT DETECTED NOT DETECTED Final   Streptococcus pyogenes NOT DETECTED NOT DETECTED Final   A.calcoaceticus-baumannii NOT DETECTED NOT DETECTED Final   Bacteroides fragilis NOT DETECTED NOT DETECTED Final   Enterobacterales DETECTED (A) NOT DETECTED Final    Comment: Enterobacterales represent a large order of gram negative bacteria, not a single organism. CRITICAL RESULT CALLED TO, READ BACK BY AND VERIFIED WITH: NATHAN BELUE AT 0017 ON 06/02/22 BY SS    Enterobacter cloacae complex NOT DETECTED NOT DETECTED Final   Escherichia coli DETECTED (A) NOT DETECTED Final    Comment: CRITICAL RESULT CALLED TO, READ BACK BY AND VERIFIED WITH: NATHAN BELUE AT 0017 ON 06/02/22 BY SS    Klebsiella aerogenes NOT DETECTED NOT DETECTED Final   Klebsiella oxytoca NOT DETECTED NOT DETECTED Final   Klebsiella pneumoniae NOT DETECTED NOT DETECTED Final   Proteus species NOT DETECTED NOT DETECTED Final   Salmonella species NOT DETECTED NOT DETECTED Final   Serratia marcescens NOT DETECTED NOT DETECTED Final   Haemophilus influenzae NOT DETECTED NOT DETECTED  Final   Neisseria meningitidis NOT DETECTED NOT DETECTED Final   Pseudomonas aeruginosa NOT DETECTED NOT DETECTED  Final   Stenotrophomonas maltophilia NOT DETECTED NOT DETECTED Final   Candida albicans NOT DETECTED NOT DETECTED Final   Candida auris NOT DETECTED NOT DETECTED Final   Candida glabrata NOT DETECTED NOT DETECTED Final   Candida krusei NOT DETECTED NOT DETECTED Final   Candida parapsilosis NOT DETECTED NOT DETECTED Final   Candida tropicalis NOT DETECTED NOT DETECTED Final   Cryptococcus neoformans/gattii NOT DETECTED NOT DETECTED Final   CTX-M ESBL DETECTED (A) NOT DETECTED Final    Comment: CRITICAL RESULT CALLED TO, READ BACK BY AND VERIFIED WITH: NATHAN BELUE AT 0017 ON 06/02/22 BY SS (NOTE) Extended spectrum beta-lactamase detected. Recommend a carbapenem as initial therapy.      Carbapenem resistance IMP NOT DETECTED NOT DETECTED Final   Carbapenem resistance KPC NOT DETECTED NOT DETECTED Final   Carbapenem resistance NDM NOT DETECTED NOT DETECTED Final   Carbapenem resist OXA 48 LIKE NOT DETECTED NOT DETECTED Final   Carbapenem resistance VIM NOT DETECTED NOT DETECTED Final    Comment: Performed at [facility], [address], [place], [place] [address]  Urine Culture     Status: None   Collection Time: 06/01/22  1:06 PM   Specimen: In/Out Cath Urine  Result Value Ref Range Status   Specimen Description   Final    IN/OUT CATH URINE Performed at [facility], [address], [place], [place] [address]    Special Requests   Final    NONE Performed at [facility], [address], [place], [place] [address]    Culture   Final    NO GROWTH Performed at [facility], [address], [place], [place] [address]    Report Status 06/02/2022 FINAL  Final  Aerobic Culture w Gram Stain (superficial specimen)     Status: None (Preliminary result)   Collection Time: 06/01/22  4:35 PM   Specimen: Wound  Result Value Ref Range Status    Specimen Description   Final    WOUND Performed at [facility], [address], [place], [place] [address]    Special Requests   Final    RT KNEE Performed at [facility], [address] [place], [place] [address]    Gram Stain   Final    RARE WBC PRESENT, PREDOMINANTLY PMN RARE GRAM NEGATIVE RODS    Culture   Final    FEW KLEBSIELLA OXYTOCA FEW ACINETOBACTER CALCOACETICUS/BAUMANNII COMPLEX RARE PSEUDOMONAS AERUGINOSA Confirmed Extended Spectrum Beta-Lactamase Producer (ESBL).  In bloodstream infections from ESBL organisms, carbapenems are preferred over piperacillin/tazobactam. They are shown to have a lower risk of mortality. FOR KLEBSIELLA Performed at [facility], [address] [place], [place] [address]    Report Status PENDING  Incomplete   Organism ID, Bacteria ACINETOBACTER CALCOACETICUS/BAUMANNII COMPLEX  Final   Organism ID, Bacteria KLEBSIELLA OXYTOCA  Final   Organism ID, Bacteria PSEUDOMONAS AERUGINOSA  Final      Susceptibility   Acinetobacter calcoaceticus/baumannii complex - MIC*    CEFTAZIDIME 8 SENSITIVE Sensitive     CIPROFLOXACIN 1 SENSITIVE Sensitive     GENTAMICIN <=1 SENSITIVE Sensitive     IMIPENEM <=0.25 SENSITIVE Sensitive     PIP/TAZO 8 SENSITIVE Sensitive     TRIMETH/SULFA >=320 RESISTANT Resistant     AMPICILLIN/SULBACTAM <=2 SENSITIVE Sensitive     * FEW ACINETOBACTER CALCOACETICUS/BAUMANNII COMPLEX   Klebsiella oxytoca - MIC*    AMPICILLIN >=32 RESISTANT Resistant     CEFAZOLIN >=64 RESISTANT Resistant  CEFEPIME >=32 RESISTANT Resistant     CEFTAZIDIME RESISTANT Resistant     CEFTRIAXONE >=64 RESISTANT Resistant     CIPROFLOXACIN 1 RESISTANT Resistant     GENTAMICIN >=16 RESISTANT Resistant     IMIPENEM <=0.25 SENSITIVE Sensitive     TRIMETH/SULFA >=320 RESISTANT Resistant     AMPICILLIN/SULBACTAM >=32 RESISTANT Resistant     PIP/TAZO 16 SENSITIVE Sensitive     * FEW KLEBSIELLA OXYTOCA   Pseudomonas  aeruginosa - MIC*    CEFTAZIDIME 4 SENSITIVE Sensitive     CIPROFLOXACIN <=0.25 SENSITIVE Sensitive     GENTAMICIN <=1 SENSITIVE Sensitive     IMIPENEM 2 SENSITIVE Sensitive     PIP/TAZO 8 SENSITIVE Sensitive     CEFEPIME 2 SENSITIVE Sensitive     * RARE PSEUDOMONAS AERUGINOSA  MRSA Next Gen by PCR, Nasal     Status: None   Collection Time: 06/01/22  9:00 PM   Specimen: Nasal Mucosa; Nasal Swab  Result Value Ref Range Status   MRSA by PCR Next Gen NOT DETECTED NOT DETECTED Final    Comment: (NOTE) The GeneXpert MRSA Assay (FDA approved for NASAL specimens only), is one component of a comprehensive MRSA colonization surveillance program. It is not intended to diagnose MRSA infection nor to guide or monitor treatment for MRSA infections. Test performance is not FDA approved in patients less than 791 years old. Performed at [facility], [address] [place], [place] [address]   Aerobic/Anaerobic Culture w Gram Stain (surgical/deep wound)     Status: None (Preliminary result)   Collection Time: 06/02/22  2:37 PM   Specimen: PATH Cytology Peritoneal fluid  Result Value Ref Range Status   Specimen Description   Final    PERITONEAL Performed at [facility], [address] [place], [place] [address]    Special Requests   Final    NONE Performed at [facility], [address] [place], [place] [address]    Gram Stain   Final    FEW WBC PRESENT,BOTH PMN AND MONONUCLEAR NO ORGANISMS SEEN    Culture   Final    NO GROWTH 3 DAYS NO ANAEROBES ISOLATED; CULTURE IN PROGRESS FOR 5 DAYS Performed at [facility], [address] [place], [place] [address]    Report Status PENDING  Incomplete  Acid Fast Smear (AFB)     Status: None   Collection Time: 06/02/22  2:37 PM   Specimen: PATH Cytology Peritoneal fluid  Result Value Ref Range Status   AFB Specimen Processing Concentration  Final   Acid Fast Smear Negative  Final    Comment:  (NOTE) Performed At: [facility] [address] [place], [place] [id number] Jolene SchimkeNagendra Sanjai MD MV:[phone]    Source (AFB) PERI  Final    Comment: Performed at [facility], [address] [place], [place] [address]  Culture, blood (Routine X 2) w Reflex to ID Panel     Status: None (Preliminary result)   Collection Time: 06/05/22  4:14 AM   Specimen: BLOOD  Result Value Ref Range Status   Specimen Description BLOOD BLOOD RIGHT HAND  Final   Special Requests   Final    BOTTLES DRAWN AEROBIC AND ANAEROBIC Blood Culture adequate volume   Culture   Final    NO GROWTH 1 DAY Performed at [facility], [address] [place], [place] [address]    Report Status PENDING  Incomplete  Culture, blood (Routine X 2) w Reflex to  ID Panel     Status: None (Preliminary result)   Collection Time: 06/05/22  4:14 AM   Specimen: BLOOD  Result Value Ref Range Status   Specimen Description BLOOD BLOOD LEFT HAND  Final   Special Requests   Final    IN PEDIATRIC BOTTLE Blood Culture results may not be optimal due to an excessive volume of blood received in culture bottles   Culture   Final    NO GROWTH 1 DAY Performed at [facility], [address] [place], [place] [address]    Report Status PENDING  Incomplete         Radiology Studies: No results found.      Scheduled Meds:  ferrous sulfate  325 mg Oral Q breakfast   folic acid  1 mg Oral Daily   furosemide  40 mg Oral BID   gabapentin  300 mg Oral TID   methocarbamol  750 mg Oral TID   nicotine  21 mg Transdermal Daily   nystatin  5 mL Oral QID   sodium chloride flush  3 mL Intravenous Q12H   spironolactone  100 mg Oral Daily   thiamine (VITAMIN B1) injection  100 mg Intravenous Daily   Continuous Infusions:  sodium chloride Stopped (06/06/22 0521)   albumin human     meropenem (MERREM) IV 1 g (06/06/22 1052)     LOS: 5 days     Tresa Moore, MD [facility]   If  7PM-7AM, please contact night-coverage  06/06/2022, 11:14 AM

## 2022-06-07 LAB — CBC WITH DIFFERENTIAL/PLATELET
Abs Immature Granulocytes: 0.09 10*3/uL — ABNORMAL HIGH (ref 0.00–0.07)
Basophils Absolute: 0 10*3/uL (ref 0.0–0.1)
Basophils Relative: 0 %
Eosinophils Absolute: 0.1 10*3/uL (ref 0.0–0.5)
Eosinophils Relative: 3 %
HCT: 25.2 % — ABNORMAL LOW (ref 39.0–52.0)
Hemoglobin: 8.5 g/dL — ABNORMAL LOW (ref 13.0–17.0)
Immature Granulocytes: 2 %
Lymphocytes Relative: 15 %
Lymphs Abs: 0.6 10*3/uL — ABNORMAL LOW (ref 0.7–4.0)
MCH: 33.7 pg (ref 26.0–34.0)
MCHC: 33.7 g/dL (ref 30.0–36.0)
MCV: 100 fL (ref 80.0–100.0)
Monocytes Absolute: 0.5 10*3/uL (ref 0.1–1.0)
Monocytes Relative: 14 %
Neutro Abs: 2.5 10*3/uL (ref 1.7–7.7)
Neutrophils Relative %: 66 %
Platelets: 63 10*3/uL — ABNORMAL LOW (ref 150–400)
RBC: 2.52 MIL/uL — ABNORMAL LOW (ref 4.22–5.81)
RDW: 16.3 % — ABNORMAL HIGH (ref 11.5–15.5)
WBC: 3.8 10*3/uL — ABNORMAL LOW (ref 4.0–10.5)
nRBC: 0 % (ref 0.0–0.2)

## 2022-06-07 LAB — AEROBIC/ANAEROBIC CULTURE W GRAM STAIN (SURGICAL/DEEP WOUND): Culture: NO GROWTH

## 2022-06-07 LAB — AEROBIC CULTURE W GRAM STAIN (SUPERFICIAL SPECIMEN)

## 2022-06-07 LAB — BASIC METABOLIC PANEL
Anion gap: 4 — ABNORMAL LOW (ref 5–15)
BUN: 11 mg/dL (ref 6–20)
CO2: 26 mmol/L (ref 22–32)
Calcium: 8 mg/dL — ABNORMAL LOW (ref 8.9–10.3)
Chloride: 106 mmol/L (ref 98–111)
Creatinine, Ser: 0.62 mg/dL (ref 0.61–1.24)
GFR, Estimated: >60 mL/min (ref >60)
Glucose, Bld: 115 mg/dL — ABNORMAL HIGH (ref 70–99)
Potassium: 4.1 mmol/L (ref 3.5–5.1)
Sodium: 136 mmol/L (ref 135–145)

## 2022-06-07 NOTE — Progress Notes (Addendum)
Subjective:  Patient seen today with his RN at the bedside.  Wound VAC still in place.  Wound VAC was placed by [facility] service.  Patient denies any significant right knee pain.  Objective:   VITALS:   Vitals:   06/06/22 2039 06/07/22 0500 06/07/22 0524 06/07/22 0809  BP: 111/68  116/72 119/69  Pulse: 92  89 94  Resp: 20  18 17   Temp: 99.5 F (37.5 C)  98.9 F (37.2 C) 98.5 F (36.9 C)  TempSrc: Oral  Oral   SpO2: 97%  98% 96%  Weight:  108.9 kg    Height:        PHYSICAL EXAM: Right knee:  Erythema has completely resolved.  Patient has no effusion.  No ecchymosis around the knee.  Patient has mild lower leg edema.  He has palpable pedal pulses.  He has intact sensation light touch but states he feels paresthesias in both feet.  He has intact motor function distally and can flex and extend his toes and dorsiflex and plantarflex his ankles.  His leg and thigh compartments are soft and compressible.  Wound VAC is in place does not have significant output.  Patient demonstrates flexion of 120 degrees and full extension actively without pain.   LABS  Results for orders placed or performed during the hospital encounter of 06/01/22 (from the past 24 hour(s))  CBC with Differential/Platelet     Status: Abnormal   Collection Time: 06/07/22  7:40 AM  Result Value Ref Range   WBC 3.8 (L) 4.0 - 10.5 K/uL   RBC 2.52 (L) 4.22 - 5.81 MIL/uL   Hemoglobin 8.5 (L) 13.0 - 17.0 g/dL   HCT 06/09/22 (L) 58.5 - 27.7 %   MCV 100.0 80.0 - 100.0 fL   MCH 33.7 26.0 - 34.0 pg   MCHC 33.7 30.0 - 36.0 g/dL   RDW 82.4 (H) 23.5 - 36.1 %   Platelets 63 (L) 150 - 400 K/uL   nRBC 0.0 0.0 - 0.2 %   Neutrophils Relative % 66 %   Neutro Abs 2.5 1.7 - 7.7 K/uL   Lymphocytes Relative 15 %   Lymphs Abs 0.6 (L) 0.7 - 4.0 K/uL   Monocytes Relative 14 %   Monocytes Absolute 0.5 0.1 - 1.0 K/uL   Eosinophils Relative 3 %   Eosinophils Absolute 0.1 0.0 - 0.5 K/uL   Basophils Relative 0 %   Basophils Absolute 0.0 0.0  - 0.1 K/uL   Immature Granulocytes 2 %   Abs Immature Granulocytes 0.09 (H) 0.00 - 0.07 K/uL  Basic metabolic panel     Status: Abnormal   Collection Time: 06/07/22  7:40 AM  Result Value Ref Range   Sodium 136 135 - 145 mmol/L   Potassium 4.1 3.5 - 5.1 mmol/L   Chloride 106 98 - 111 mmol/L   CO2 26 22 - 32 mmol/L   Glucose, Bld 115 (H) 70 - 99 mg/dL   BUN 11 6 - 20 mg/dL   Creatinine, Ser 06/09/22 0.61 - 1.24 mg/dL   Calcium 8.0 (L) 8.9 - 10.3 mg/dL   GFR, Estimated 1.54 >00 mL/min   Anion gap 4 (L) 5 - 15    No results found.  Assessment/Plan:     Principal Problem:   AMS (altered mental status) Active Problems:   Alcoholic cirrhosis of liver with ascites (HCC)   Hypokalemia   Anemia   Polysubstance abuse (HCC)   Septic arthritis (HCC)   Thrombocytopenia (HCC)   Portal  hypertension (HCC)   Bacteremia  Patient is doing well from orthopedic standpoint.  His most recent WBC is 3.8.  His erythema has completely resolved around the right knee.  His wound VAC not demonstrated significant output.  Patient can actively flex and extend his knee without pain.  Patient's nurse states that he is likely to discharge on 06/09/2022.  I would continue the wound VAC until his discharge.  Patient may follow-up in my office in approximately 10 days after discharge.  I will defer to ID regarding oral antibiotics after discharge.    Juanell Fairly , MD 06/07/2022, 2:08 PM

## 2022-06-07 NOTE — Progress Notes (Signed)
PROGRESS NOTE    Marco Spears  ZOX:[id number] DOB: 12/24/1975 DOA: 06/01/2022 PCP: Pcp, No    Brief Narrative:  46 y.o. male seen in ed brought by EMS for altered mental status where patient was picked up from a home where he had been on meth.  Patient's right knee incision had clear drainage per report patient was intermittently arousable but restless agitated and almost paranoid.  Patient reports pain over the right knee.  Patient gets startled with sound. Patient is altered and somnolent and not oriented and responds to when called but falls asleep immediately.   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  Patient had received Harvoni for his hep C and has had a DVT for which she has taken Eliquis.  Patient has past medical history of allergies to amoxicillin. Patient had diarrhea right knee recurrent septic prepatellar bursitis and has had I&D on 05/07/2022.  Patient had paracentesis on 05/22/2022 with 3.6 L of hazy yellow fluid.  Last paracentesis was on 05/26/2022 where he had 4.8 L removed. Patient is a large hematoma about 12 cm on his lower abdominal area more towards suprapubic area.   Speech remains somewhat tangential and mumbling.  Per GI not necessarily consistent with hepatic encephalopathy.  Has wound VAC in place on right knee.  Is on meropenem.  Hemodynamically stable .  Infectious disease following.  GI signed off as of 8/9.  Home diuretics resumed. Mental status improved on 8/10.  Continues to endorse severe muscle spasms Status post ultrasound-guided paracentesis 8/11.  6 L fluid liberated   Assessment & Plan:   Principal Problem:   AMS (altered mental status) Active Problems:   Alcoholic cirrhosis of liver with ascites (HCC)   Anemia   Septic arthritis (HCC)   Hypokalemia   Polysubstance abuse (HCC)   Thrombocytopenia (HCC)   Portal hypertension (HCC)    Bacteremia  Acute metabolic encephalopathy Likely multifactorial.  Patient is a admitted methamphetamine user.  Also has infection which could be contributing.  Patient remains somewhat lethargic with tangential thought and mumbling.  CT head normal. 8/10: Mental status improved.  Appears to be back to baseline Plan: Continue current plan of care.  Avoid sedatives.  Antibiotics as below  Septic arthritis right knee ESBL E. coli bacteremia Blood culture positive from 8/8.  Infectious disease on board.  Was previously getting vancomycin and Rocephin.  Changed to meropenem.  Orthopedics consulted.  [facility]  Application of knee immobilizer. Plan: Continue meropenem ID following Orthopedics following, improving from orthopedic standpoint Continue VAC dressing to right knee  Anemia Thrombocytopenia Portal hypertension Spontaneous bacterial peritonitis Hepatic cirrhosis No evidence of bleeding Currently no indication for PRBC or platelet transfusion Will monitor closely for bleed Continue meropenem as above as this will cover for SBP as well Continue home Lasix and Aldactone 8/11: Repeat ultrasound-guided paracentesis ordered 8/11: Ultrasound-guided paracentesis.  6 L fluid removed  Polysubstance use Counseled patient Avoid narcotics    DVT prophylaxis: SCD Code Status: Full Family Communication: None Disposition Plan: Status is: Inpatient Remains inpatient appropriate because: Right knee septic arthritis on IV antibiotics and wound VAC   Level of care: Med-Surg  Consultants:  ID  Procedures:  Paracentesis Knee arthrocentesis  Antimicrobials: Meropenem   Subjective: Seen and examined.  No acute events overnight.  No new complaints  Objective: Vitals:   06/06/22 2039 06/07/22 0500 06/07/22 0524  06/07/22 0809  BP: 111/68  116/72 119/69  Pulse: 92  89 94  Resp: 20  18 17   Temp: 99.5 F (37.5 C)  98.9 F (37.2 C) 98.5 F (36.9  C)  TempSrc: Oral  Oral   SpO2: 97%  98% 96%  Weight:  108.9 kg    Height:        Intake/Output Summary (Last 24 hours) at 06/07/2022 1044 Last data filed at 06/06/2022 2049 Gross per 24 hour  Intake 50 ml  Output 1975 ml  Net -1925 ml   Filed Weights   06/05/22 0500 06/06/22 0417 06/07/22 0500  Weight: 107.2 kg 110.7 kg 108.9 kg    Examination:  General exam: No acute distress Respiratory system: Lungs clear.  Normal work of breathing.  Room air Cardiovascular system: S1-S2, RRR, no murmurs, no pedal edema Gastrointestinal system: Soft, distended, nontender, normal bowel sounds Central nervous system: Alert, oriented x 3, no focal deficits Extremities: Unable to assess Skin: No rashes, lesions or ulcers Psychiatry: Judgement and insight appear impaired. Mood & affect flattened.     Data Reviewed: I have personally reviewed following labs and imaging studies  CBC: Recent Labs  Lab 06/01/22 1153 06/02/22 0202 06/02/22 0410 06/04/22 0817 06/05/22 0755 06/07/22 0740  WBC 6.2  --  9.8 3.3* 2.9* 3.8*  NEUTROABS 5.5  --   --  2.1 1.8 2.5  HGB 10.7* 9.0* 8.8* 7.9* 7.9* 8.5*  HCT 31.6* 26.1* 25.3* 22.6* 23.1* 25.2*  MCV 98.8  --  98.4 97.8 98.7 100.0  PLT 55*  --  35* 33* 45* 63*   Basic Metabolic Panel: Recent Labs  Lab 06/02/22 0202 06/02/22 0410 06/03/22 0359 06/04/22 0425 06/04/22 0817 06/05/22 0755 06/07/22 0740  NA 132* 130*  --   --  131* 134* 136  K 3.6 3.5  --   --  2.8* 3.9 4.1  CL 103 102  --   --  103 108 106  CO2 24 24  --   --  23 23 26   GLUCOSE 109* 107*  --   --  152* 123* 115*  BUN 21* 22*  --   --  11 11 11   CREATININE 0.70 0.80 0.73 0.75 0.63 0.59* 0.62  CALCIUM 7.8* 7.7*  --   --  7.6* 7.6* 8.0*  MG 1.7  --   --   --   --   --   --    GFR: Estimated Creatinine Clearance: 150.6 mL/min (by C-G formula based on SCr of 0.62 mg/dL). Liver Function Tests: Recent Labs  Lab 06/01/22 1153 06/02/22 0202 06/02/22 0410 06/04/22 0817  AST  50*  --  37 49*  ALT 38  --  31 30  ALKPHOS 115  --  81 67  BILITOT 5.1* 5.6* 5.4* 2.2*  PROT 6.0*  --  4.9* 4.9*  ALBUMIN 2.4*  --  2.0* 2.6*   No results for input(s): "LIPASE", "AMYLASE" in the last 168 hours. Recent Labs  Lab 06/01/22 1153 06/02/22 1058  AMMONIA 33 36*   Coagulation Profile: Recent Labs  Lab 06/01/22 1153  INR 1.5*   Cardiac Enzymes: Recent Labs  Lab 06/03/22 0359  CKTOTAL 59   BNP (last 3 results) No results for input(s): "PROBNP" in the last 8760 hours. HbA1C: No results for input(s): "HGBA1C" in the last 72 hours. CBG: Recent Labs  Lab 06/01/22 2050 06/03/22 1933 06/04/22 0749  GLUCAP 108* 118* 140*   Lipid Profile: No results for input(s): "CHOL", "  HDL", "LDLCALC", "TRIG", "CHOLHDL", "LDLDIRECT" in the last 72 hours. Thyroid Function Tests: No results for input(s): "TSH", "T4TOTAL", "FREET4", "T3FREE", "THYROIDAB" in the last 72 hours.  Anemia Panel: No results for input(s): "VITAMINB12", "FOLATE", "FERRITIN", "TIBC", "IRON", "RETICCTPCT" in the last 72 hours. Sepsis Labs: Recent Labs  Lab 06/01/22 1153 06/01/22 1405 06/02/22 0202 06/02/22 0410 06/03/22 0359  PROCALCITON <0.10  --   --  2.16 1.79  LATICACIDVEN 2.7* 2.0* 1.7 1.6  --     Recent Results (from the past 240 hour(s))  Resp Panel by RT-PCR (Flu A&B, Covid) Anterior Nasal Swab     Status: None   Collection Time: 06/01/22 12:34 PM   Specimen: Anterior Nasal Swab  Result Value Ref Range Status   SARS Coronavirus 2 by RT PCR NEGATIVE NEGATIVE Final    Comment: (NOTE) SARS-CoV-2 target nucleic acids are NOT DETECTED.  The SARS-CoV-2 RNA is generally detectable in upper respiratory specimens during the acute phase of infection. The lowest concentration of SARS-CoV-2 viral copies this assay can detect is 138 copies/mL. A negative result does not preclude SARS-Cov-2 infection and should not be used as the sole basis for treatment or other patient management decisions. A  negative result may occur with  improper specimen collection/handling, submission of specimen other than nasopharyngeal swab, presence of viral mutation(s) within the areas targeted by this assay, and inadequate number of viral copies(<138 copies/mL). A negative result must be combined with clinical observations, patient history, and epidemiological information. The expected result is Negative.  Fact Sheet for Patients:  BloggerCourse.com  Fact Sheet for Healthcare Providers:  SeriousBroker.it  This test is no t yet approved or cleared by the [place] FDA and  has been authorized for detection and/or diagnosis of SARS-CoV-2 by FDA under an Emergency Use Authorization (EUA). This EUA will remain  in effect (meaning this test can be used) for the duration of the COVID-19 declaration under Section 564(b)(1) of the Act, 21 U.S.C.section 360bbb-3(b)(1), unless the authorization is terminated  or revoked sooner.       Influenza A by PCR NEGATIVE NEGATIVE Final   Influenza B by PCR NEGATIVE NEGATIVE Final    Comment: (NOTE) The Xpert Xpress SARS-CoV-2/FLU/RSV plus assay is intended as an aid in the diagnosis of influenza from Nasopharyngeal swab specimens and should not be used as a sole basis for treatment. Nasal washings and aspirates are unacceptable for Xpert Xpress SARS-CoV-2/FLU/RSV testing.  Fact Sheet for Patients: BloggerCourse.com  Fact Sheet for Healthcare Providers: SeriousBroker.it  This test is not yet approved or cleared by the [place] FDA and has been authorized for detection and/or diagnosis of SARS-CoV-2 by FDA under an Emergency Use Authorization (EUA). This EUA will remain in effect (meaning this test can be used) for the duration of the COVID-19 declaration under Section 564(b)(1) of the Act, 21 U.S.C. section 360bbb-3(b)(1), unless the authorization  is terminated or revoked.  Performed at [facility], [address] [place], [place] [address]   Blood Culture (routine x 2)     Status: Abnormal   Collection Time: 06/01/22 12:34 PM   Specimen: BLOOD  Result Value Ref Range Status   Specimen Description   Final    BLOOD Blood Culture adequate volume Performed at [facility], [address], [place], [place] [address]    Special Requests   Final    BOTTLES DRAWN AEROBIC AND ANAEROBIC LEFT ANTECUBITAL Performed at [facility], [address], [place], [place] [address]  Culture  Setup Time   Final    GRAM NEGATIVE RODS ANAEROBIC BOTTLE ONLY CRITICAL RESULT CALLED TO, READ BACK BY AND VERIFIED WITH: NATHAN BELUE AT 0017 ON 06/02/22 BY SS    Culture (A)  Final    ESCHERICHIA COLI Confirmed Extended Spectrum Beta-Lactamase Producer (ESBL).  In bloodstream infections from ESBL organisms, carbapenems are preferred over piperacillin/tazobactam. They are shown to have a lower risk of mortality.    Report Status 06/04/2022 FINAL  Final   Organism ID, Bacteria ESCHERICHIA COLI  Final      Susceptibility   Escherichia coli - MIC*    AMPICILLIN >=32 RESISTANT Resistant     CEFAZOLIN >=64 RESISTANT Resistant     CEFEPIME 16 RESISTANT Resistant     CEFTAZIDIME RESISTANT Resistant     CEFTRIAXONE >=64 RESISTANT Resistant     CIPROFLOXACIN >=4 RESISTANT Resistant     GENTAMICIN <=1 SENSITIVE Sensitive     IMIPENEM <=0.25 SENSITIVE Sensitive     TRIMETH/SULFA >=320 RESISTANT Resistant     AMPICILLIN/SULBACTAM 16 INTERMEDIATE Intermediate     PIP/TAZO <=4 SENSITIVE Sensitive     * ESCHERICHIA COLI  Blood Culture (routine x 2)     Status: None   Collection Time: 06/01/22 12:34 PM   Specimen: BLOOD  Result Value Ref Range Status   Specimen Description   Final    BLOOD Blood Culture results may not be optimal due to an inadequate volume of blood received in culture bottles   Special Requests BOTTLES  DRAWN AEROBIC AND ANAEROBIC WRIST  Final   Culture   Final    NO GROWTH 5 DAYS Performed at [facility], [address] [place], [place] [address]    Report Status 06/06/2022 FINAL  Final  Blood Culture ID Panel (Reflexed)     Status: Abnormal   Collection Time: 06/01/22 12:34 PM  Result Value Ref Range Status   Enterococcus faecalis NOT DETECTED NOT DETECTED Final   Enterococcus Faecium NOT DETECTED NOT DETECTED Final   Listeria monocytogenes NOT DETECTED NOT DETECTED Final   Staphylococcus species NOT DETECTED NOT DETECTED Final   Staphylococcus aureus (BCID) NOT DETECTED NOT DETECTED Final   Staphylococcus epidermidis NOT DETECTED NOT DETECTED Final   Staphylococcus lugdunensis NOT DETECTED NOT DETECTED Final   Streptococcus species NOT DETECTED NOT DETECTED Final   Streptococcus agalactiae NOT DETECTED NOT DETECTED Final   Streptococcus pneumoniae NOT DETECTED NOT DETECTED Final   Streptococcus pyogenes NOT DETECTED NOT DETECTED Final   A.calcoaceticus-baumannii NOT DETECTED NOT DETECTED Final   Bacteroides fragilis NOT DETECTED NOT DETECTED Final   Enterobacterales DETECTED (A) NOT DETECTED Final    Comment: Enterobacterales represent a large order of gram negative bacteria, not a single organism. CRITICAL RESULT CALLED TO, READ BACK BY AND VERIFIED WITH: NATHAN BELUE AT 0017 ON 06/02/22 BY SS    Enterobacter cloacae complex NOT DETECTED NOT DETECTED Final   Escherichia coli DETECTED (A) NOT DETECTED Final    Comment: CRITICAL RESULT CALLED TO, READ BACK BY AND VERIFIED WITH: NATHAN BELUE AT 0017 ON 06/02/22 BY SS    Klebsiella aerogenes NOT DETECTED NOT DETECTED Final   Klebsiella oxytoca NOT DETECTED NOT DETECTED Final   Klebsiella pneumoniae NOT DETECTED NOT DETECTED Final   Proteus species NOT DETECTED NOT DETECTED Final   Salmonella species NOT DETECTED NOT DETECTED Final   Serratia marcescens NOT DETECTED NOT DETECTED Final   Haemophilus influenzae NOT  DETECTED NOT DETECTED Final   Neisseria meningitidis NOT DETECTED  NOT DETECTED Final   Pseudomonas aeruginosa NOT DETECTED NOT DETECTED Final   Stenotrophomonas maltophilia NOT DETECTED NOT DETECTED Final   Candida albicans NOT DETECTED NOT DETECTED Final   Candida auris NOT DETECTED NOT DETECTED Final   Candida glabrata NOT DETECTED NOT DETECTED Final   Candida krusei NOT DETECTED NOT DETECTED Final   Candida parapsilosis NOT DETECTED NOT DETECTED Final   Candida tropicalis NOT DETECTED NOT DETECTED Final   Cryptococcus neoformans/gattii NOT DETECTED NOT DETECTED Final   CTX-M ESBL DETECTED (A) NOT DETECTED Final    Comment: CRITICAL RESULT CALLED TO, READ BACK BY AND VERIFIED WITH: NATHAN BELUE AT 0017 ON 06/02/22 BY SS (NOTE) Extended spectrum beta-lactamase detected. Recommend a carbapenem as initial therapy.      Carbapenem resistance IMP NOT DETECTED NOT DETECTED Final   Carbapenem resistance KPC NOT DETECTED NOT DETECTED Final   Carbapenem resistance NDM NOT DETECTED NOT DETECTED Final   Carbapenem resist OXA 48 LIKE NOT DETECTED NOT DETECTED Final   Carbapenem resistance VIM NOT DETECTED NOT DETECTED Final    Comment: Performed at [facility], [address] [place], [place] [address]  Urine Culture     Status: None   Collection Time: 06/01/22  1:06 PM   Specimen: In/Out Cath Urine  Result Value Ref Range Status   Specimen Description   Final    IN/OUT CATH URINE Performed at [facility], [address] [place], [place] [address]    Special Requests   Final    NONE Performed at [facility], [address], [place], [place] [address]    Culture   Final    NO GROWTH Performed at [facility], [address], [place], [place] [address]    Report Status 06/02/2022 FINAL  Final  Aerobic Culture w Gram Stain (superficial specimen)     Status: None (Preliminary result)   Collection Time: 06/01/22  4:35 PM   Specimen: Wound  Result  Value Ref Range Status   Specimen Description   Final    WOUND Performed at [facility], [address], [place], [place] [address]    Special Requests   Final    RT KNEE Performed at [facility], [address] [place], [place] [address]    Gram Stain   Final    RARE WBC PRESENT, PREDOMINANTLY PMN RARE GRAM NEGATIVE RODS    Culture   Final    FEW KLEBSIELLA OXYTOCA FEW ACINETOBACTER CALCOACETICUS/BAUMANNII COMPLEX RARE PSEUDOMONAS AERUGINOSA Confirmed Extended Spectrum Beta-Lactamase Producer (ESBL).  In bloodstream infections from ESBL organisms, carbapenems are preferred over piperacillin/tazobactam. They are shown to have a lower risk of mortality. FOR KLEBSIELLA Performed at [facility], [address], [place], [place] [address]    Report Status PENDING  Incomplete   Organism ID, Bacteria ACINETOBACTER CALCOACETICUS/BAUMANNII COMPLEX  Final   Organism ID, Bacteria KLEBSIELLA OXYTOCA  Final   Organism ID, Bacteria PSEUDOMONAS AERUGINOSA  Final      Susceptibility   Acinetobacter calcoaceticus/baumannii complex - MIC*    CEFTAZIDIME 8 SENSITIVE Sensitive     CIPROFLOXACIN 1 SENSITIVE Sensitive     GENTAMICIN <=1 SENSITIVE Sensitive     IMIPENEM <=0.25 SENSITIVE Sensitive     PIP/TAZO 8 SENSITIVE Sensitive     TRIMETH/SULFA >=320 RESISTANT Resistant     AMPICILLIN/SULBACTAM <=2 SENSITIVE Sensitive     * FEW ACINETOBACTER CALCOACETICUS/BAUMANNII COMPLEX   Klebsiella oxytoca - MIC*    AMPICILLIN >=32 RESISTANT  Resistant     CEFAZOLIN >=64 RESISTANT Resistant     CEFEPIME >=32 RESISTANT Resistant     CEFTAZIDIME RESISTANT Resistant     CEFTRIAXONE >=64 RESISTANT Resistant     CIPROFLOXACIN 1 RESISTANT Resistant     GENTAMICIN >=16 RESISTANT Resistant     IMIPENEM <=0.25 SENSITIVE Sensitive     TRIMETH/SULFA >=320 RESISTANT Resistant     AMPICILLIN/SULBACTAM >=32 RESISTANT Resistant     PIP/TAZO 16 SENSITIVE Sensitive     * FEW KLEBSIELLA  OXYTOCA   Pseudomonas aeruginosa - MIC*    CEFTAZIDIME 4 SENSITIVE Sensitive     CIPROFLOXACIN <=0.25 SENSITIVE Sensitive     GENTAMICIN <=1 SENSITIVE Sensitive     IMIPENEM 2 SENSITIVE Sensitive     PIP/TAZO 8 SENSITIVE Sensitive     CEFEPIME 2 SENSITIVE Sensitive     * RARE PSEUDOMONAS AERUGINOSA  MRSA Next Gen by PCR, Nasal     Status: None   Collection Time: 06/01/22  9:00 PM   Specimen: Nasal Mucosa; Nasal Swab  Result Value Ref Range Status   MRSA by PCR Next Gen NOT DETECTED NOT DETECTED Final    Comment: (NOTE) The GeneXpert MRSA Assay (FDA approved for NASAL specimens only), is one component of a comprehensive MRSA colonization surveillance program. It is not intended to diagnose MRSA infection nor to guide or monitor treatment for MRSA infections. Test performance is not FDA approved in patients less than 11 years old. Performed at [facility], [address] [place], [place] [address]   Aerobic/Anaerobic Culture w Gram Stain (surgical/deep wound)     Status: None (Preliminary result)   Collection Time: 06/02/22  2:37 PM   Specimen: PATH Cytology Peritoneal fluid  Result Value Ref Range Status   Specimen Description   Final    PERITONEAL Performed at [facility], [address] [place], [place] [address]    Special Requests   Final    NONE Performed at [facility], [address] [place], [place] [address]    Gram Stain   Final    FEW WBC PRESENT,BOTH PMN AND MONONUCLEAR NO ORGANISMS SEEN    Culture   Final    NO GROWTH 4 DAYS NO ANAEROBES ISOLATED; CULTURE IN PROGRESS FOR 5 DAYS Performed at [facility], [address], [place], [place] [address]    Report Status PENDING  Incomplete  Acid Fast Smear (AFB)     Status: None   Collection Time: 06/02/22  2:37 PM   Specimen: PATH Cytology Peritoneal fluid  Result Value Ref Range Status   AFB Specimen Processing Concentration  Final   Acid Fast Smear Negative  Final     Comment: (NOTE) Performed At: [facility] [address] [place], [place] [id number] Jolene Schimke MD ZW:[id number]    Source (AFB) PERI  Final    Comment: Performed at [facility], [address] [place], [place] [address]  Culture, blood (Routine X 2) w Reflex to ID Panel     Status: None (Preliminary result)   Collection Time: 06/05/22  4:14 AM   Specimen: BLOOD  Result Value Ref Range Status   Specimen Description BLOOD BLOOD RIGHT HAND  Final   Special Requests   Final    BOTTLES DRAWN AEROBIC AND ANAEROBIC Blood Culture adequate volume   Culture   Final    NO GROWTH 2 DAYS Performed at [facility], [address] [place], [place] [address]    Report  Status PENDING  Incomplete  Culture, blood (Routine X 2) w Reflex to ID Panel     Status: None (Preliminary result)   Collection Time: 06/05/22  4:14 AM   Specimen: BLOOD  Result Value Ref Range Status   Specimen Description BLOOD BLOOD LEFT HAND  Final   Special Requests   Final    IN PEDIATRIC BOTTLE Blood Culture results may not be optimal due to an excessive volume of blood received in culture bottles   Culture   Final    NO GROWTH 2 DAYS Performed at [facility], [address] [place], [place] [address]    Report Status PENDING  Incomplete         Radiology Studies: No results found.      Scheduled Meds:  ferrous sulfate  325 mg Oral Q breakfast   folic acid  1 mg Oral Daily   furosemide  40 mg Oral BID   gabapentin  300 mg Oral TID   methocarbamol  750 mg Oral TID   nicotine  21 mg Transdermal Daily   nystatin  5 mL Oral QID   sodium chloride flush  3 mL Intravenous Q12H   spironolactone  100 mg Oral Daily   thiamine (VITAMIN B1) injection  100 mg Intravenous Daily   Continuous Infusions:  sodium chloride Stopped (06/06/22 0521)   albumin human     meropenem (MERREM) IV 1 g (06/07/22 1013)     LOS: 6 days     Tresa Moore, MD [facility]   If 7PM-7AM, please contact night-coverage  06/07/2022, 10:44 AM

## 2022-06-08 ENCOUNTER — Inpatient Hospital Stay: Payer: Self-pay

## 2022-06-08 DIAGNOSIS — K652 Spontaneous bacterial peritonitis: Secondary | ICD-10-CM

## 2022-06-08 LAB — BODY FLUID CELL COUNT WITH DIFFERENTIAL
Eos, Fluid: 0 %
Lymphs, Fluid: 18 %
Monocyte-Macrophage-Serous Fluid: 32 %
Neutrophil Count, Fluid: 50 %
Total Nucleated Cell Count, Fluid: 352 cu mm

## 2022-06-08 LAB — PATHOLOGIST SMEAR REVIEW

## 2022-06-08 LAB — ALBUMIN, PLEURAL OR PERITONEAL FLUID: Albumin, Fluid: <1.5 g/dL

## 2022-06-08 NOTE — Progress Notes (Signed)
Mobility Specialist - Progress Note   06/08/22 1000  Mobility  Activity Ambulated with assistance in room;Transferred from bed to chair  Level of Assistance Standby assist, set-up cues, supervision of patient - no hands on  Assistive Device None  Distance Ambulated (ft) 10 ft  Activity Response Tolerated well  $Mobility charge 1 Mobility     Pt ambulated to/from bathroom with close supervision, no AD. Mildly unsteady but no LOB. Furniture surfing. Pt transferred to chair as NT entered to provided bed linen change. Pt left in presence of NT.    Filiberto Pinks Mobility Specialist 06/08/22, 10:54 AM

## 2022-06-08 NOTE — Procedures (Signed)
PROCEDURE SUMMARY:  Successful ultrasound guided paracentesis from the right lower quadrant.  Yielded 4.4 of straw colored fluid.  No immediate complications.  The patient tolerated the procedure well.   Specimen was sent for labs.  EBL < 46mL  The patient has previously been formally evaluated by the [facility] and is being actively followed for potential future intervention.

## 2022-06-08 NOTE — Progress Notes (Signed)
PROGRESS NOTE    Marco Spears  HAL:[id number] DOB: [birth year] DOA: 06/01/2022 PCP: Pcp, No    Brief Narrative:  46 y.o. male seen in ed brought by EMS for altered mental status where patient was picked up from a home where he had been on meth.  Patient's right knee incision had clear drainage per report patient was intermittently arousable but restless agitated and almost paranoid.  Patient reports pain over the right knee.  Patient gets startled with sound. Patient is altered and somnolent and not oriented and responds to when called but falls asleep immediately.   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  Patient had received Harvoni for his hep C and has had a DVT for which she has taken Eliquis.  Patient has past medical history of allergies to amoxicillin. Patient had diarrhea right knee recurrent septic prepatellar bursitis and has had I&D on 05/07/2022.  Patient had paracentesis on 05/22/2022 with 3.6 L of hazy yellow fluid.  Last paracentesis was on 05/26/2022 where he had 4.8 L removed. Patient is a large hematoma about 12 cm on his lower abdominal area more towards suprapubic area.   Speech remains somewhat tangential and mumbling.  Per GI not necessarily consistent with hepatic encephalopathy.  Has wound VAC in place on right knee.  Is on meropenem.  Hemodynamically stable .  Infectious disease following.  GI signed off as of 8/9.  Home diuretics resumed. Mental status improved on 8/10.  Continues to endorse severe muscle spasms Status post ultrasound-guided paracentesis 8/11.  6 L fluid liberated   Assessment & Plan:   Principal Problem:   AMS (altered mental status) Active Problems:   Alcoholic cirrhosis of liver with ascites (HCC)   Anemia   Septic arthritis (HCC)   Hypokalemia   Polysubstance abuse (HCC)   Thrombocytopenia (HCC)   Portal hypertension (HCC)    Bacteremia  Acute metabolic encephalopathy Likely multifactorial.  Patient is a admitted methamphetamine user.  Also has infection which could be contributing.  Patient remains somewhat lethargic with tangential thought and mumbling.  CT head normal. 8/10: Mental status improved.  Appears to be back to baseline Plan: Continue current plan of care.  Avoid sedatives.  Antibiotics as below  Septic arthritis right knee ESBL E. coli bacteremia Blood culture positive from 8/8.  Infectious disease on board.  Was previously getting vancomycin and Rocephin.  Changed to meropenem.  Orthopedics consulted.  Incisional wound VAC in [facility] by [facility] team.  Application of knee immobilizer. Plan: Continue meropenem ID following Orthopedics following, improving from orthopedic standpoint Continue VAC dressing to right knee Current plan to continue VAC until time of discharge  Anemia Thrombocytopenia Portal hypertension Spontaneous bacterial peritonitis Hepatic cirrhosis No evidence of bleeding Currently no indication for PRBC or platelet transfusion Will monitor closely for bleed Continue meropenem as above as this will cover for SBP as well Continue home Lasix and Aldactone 8/11: Repeat ultrasound-guided paracentesis ordered 8/11: Ultrasound-guided paracentesis.  6 L fluid removed 8/14: Reaccumulating ascites.  Repeat US and paracentesis ordered  Polysubstance use Counseled patient Avoid narcotics    DVT prophylaxis: SCD Code Status: Full Family Communication: None Disposition Plan: Status is: Inpatient Remains inpatient appropriate because: Right knee septic arthritis on IV antibiotics and wound VAC   Level of care: Med-Surg  Consultants:  ID  Procedures:  Paracentesis Knee arthrocentesis  Antimicrobials: Meropenem   Subjective: Seen and examined.  Complains  of recurrent abdominal distention  Objective: Vitals:   06/07/22 1607 06/07/22 1959 06/08/22 0459 06/08/22 0824   BP: 113/69 (!) 101/59 (!) 110/55 (!) 86/48  Pulse: 84 97 98 88  Resp: 18 20 20 18   Temp: 98.2 F (36.8 C) 98.9 F (37.2 C) 98.5 F (36.9 C) 99.1 F (37.3 C)  TempSrc: Oral Oral Oral Oral  SpO2: 100% 100% 97% 98%  Weight:      Height:        Intake/Output Summary (Last 24 hours) at 06/08/2022 1042 Last data filed at 06/08/2022 0526 Gross per 24 hour  Intake --  Output 1500 ml  Net -1500 ml   Filed Weights   06/05/22 0500 06/06/22 0417 06/07/22 0500  Weight: 107.2 kg 110.7 kg 108.9 kg    Examination:  General exam: No acute distress Respiratory system: Lungs clear.  Normal work of breathing.  Room air Cardiovascular system: S1-S2, RRR, no murmurs, no pedal edema Gastrointestinal system: Distended, decreased bowel sounds Central nervous system: Alert, oriented x 3, no focal deficits Extremities: Unable to assess Skin: No rashes, lesions or ulcers Psychiatry: Judgement and insight appear impaired. Mood & affect appropriate`.     Data Reviewed: I have personally reviewed following labs and imaging studies  CBC: Recent Labs  Lab 06/01/22 1153 06/02/22 0202 06/02/22 0410 06/04/22 0817 06/05/22 0755 06/07/22 0740  WBC 6.2  --  9.8 3.3* 2.9* 3.8*  NEUTROABS 5.5  --   --  2.1 1.8 2.5  HGB 10.7* 9.0* 8.8* 7.9* 7.9* 8.5*  HCT 31.6* 26.1* 25.3* 22.6* 23.1* 25.2*  MCV 98.8  --  98.4 97.8 98.7 100.0  PLT 55*  --  35* 33* 45* 63*   Basic Metabolic Panel: Recent Labs  Lab 06/02/22 0202 06/02/22 0410 06/03/22 0359 06/04/22 0425 06/04/22 0817 06/05/22 0755 06/07/22 0740  NA 132* 130*  --   --  131* 134* 136  K 3.6 3.5  --   --  2.8* 3.9 4.1  CL 103 102  --   --  103 108 106  CO2 24 24  --   --  23 23 26   GLUCOSE 109* 107*  --   --  152* 123* 115*  BUN 21* 22*  --   --  11 11 11   CREATININE 0.70 0.80 0.73 0.75 0.63 0.59* 0.62  CALCIUM 7.8* 7.7*  --   --  7.6* 7.6* 8.0*  MG 1.7  --   --   --   --   --   --    GFR: Estimated Creatinine Clearance: 150.6 mL/min  (by C-G formula based on SCr of 0.62 mg/dL). Liver Function Tests: Recent Labs  Lab 06/01/22 1153 06/02/22 0202 06/02/22 0410 06/04/22 0817  AST 50*  --  37 49*  ALT 38  --  31 30  ALKPHOS 115  --  81 67  BILITOT 5.1* 5.6* 5.4* 2.2*  PROT 6.0*  --  4.9* 4.9*  ALBUMIN 2.4*  --  2.0* 2.6*   No results for input(s): "LIPASE", "AMYLASE" in the last 168 hours. Recent Labs  Lab 06/01/22 1153 06/02/22 1058  AMMONIA 33 36*   Coagulation Profile: Recent Labs  Lab 06/01/22 1153  INR 1.5*   Cardiac Enzymes: Recent Labs  Lab 06/03/22 0359  CKTOTAL 59   BNP (last 3 results) No results for input(s): "PROBNP" in the last 8760 hours. HbA1C: No results for input(s): "HGBA1C" in the last 72 hours. CBG: Recent Labs  Lab 06/01/22 2050 06/03/22  1933 06/04/22 0749  GLUCAP 108* 118* 140*   Lipid Profile: No results for input(s): "CHOL", "HDL", "LDLCALC", "TRIG", "CHOLHDL", "LDLDIRECT" in the last 72 hours. Thyroid Function Tests: No results for input(s): "TSH", "T4TOTAL", "FREET4", "T3FREE", "THYROIDAB" in the last 72 hours.  Anemia Panel: No results for input(s): "VITAMINB12", "FOLATE", "FERRITIN", "TIBC", "IRON", "RETICCTPCT" in the last 72 hours. Sepsis Labs: Recent Labs  Lab 06/01/22 1153 06/01/22 1405 06/02/22 0202 06/02/22 0410 06/03/22 0359  PROCALCITON <0.10  --   --  2.16 1.79  LATICACIDVEN 2.7* 2.0* 1.7 1.6  --     Recent Results (from the past 240 hour(s))  Resp Panel by RT-PCR (Flu A&B, Covid) Anterior Nasal Swab     Status: None   Collection Time: 06/01/22 12:34 PM   Specimen: Anterior Nasal Swab  Result Value Ref Range Status   SARS Coronavirus 2 by RT PCR NEGATIVE NEGATIVE Final    Comment: (NOTE) SARS-CoV-2 target nucleic acids are NOT DETECTED.  The SARS-CoV-2 RNA is generally detectable in upper respiratory specimens during the acute phase of infection. The lowest concentration of SARS-CoV-2 viral copies this assay can detect is 138 copies/mL. A  negative result does not preclude SARS-Cov-2 infection and should not be used as the sole basis for treatment or other patient management decisions. A negative result may occur with  improper specimen collection/handling, submission of specimen other than nasopharyngeal swab, presence of viral mutation(s) within the areas targeted by this assay, and inadequate number of viral copies(<138 copies/mL). A negative result must be combined with clinical observations, patient history, and epidemiological information. The expected result is Negative.  Fact Sheet for Patients:  BloggerCourse.com  Fact Sheet for Healthcare Providers:  SeriousBroker.it  This test is no t yet approved or cleared by the [place] FDA and  has been authorized for detection and/or diagnosis of SARS-CoV-2 by FDA under an Emergency Use Authorization (EUA). This EUA will remain  in effect (meaning this test can be used) for the duration of the COVID-19 declaration under Section 564(b)(1) of the Act, 21 U.S.C.section 360bbb-3(b)(1), unless the authorization is terminated  or revoked sooner.       Influenza A by PCR NEGATIVE NEGATIVE Final   Influenza B by PCR NEGATIVE NEGATIVE Final    Comment: (NOTE) The Xpert Xpress SARS-CoV-2/FLU/RSV plus assay is intended as an aid in the diagnosis of influenza from Nasopharyngeal swab specimens and should not be used as a sole basis for treatment. Nasal washings and aspirates are unacceptable for Xpert Xpress SARS-CoV-2/FLU/RSV testing.  Fact Sheet for Patients: BloggerCourse.com  Fact Sheet for Healthcare Providers: SeriousBroker.it  This test is not yet approved or cleared by the [place] FDA and has been authorized for detection and/or diagnosis of SARS-CoV-2 by FDA under an Emergency Use Authorization (EUA). This EUA will remain in effect (meaning this test can  be used) for the duration of the COVID-19 declaration under Section 564(b)(1) of the Act, 21 U.S.C. section 360bbb-3(b)(1), unless the authorization is terminated or revoked.  Performed at [facility] [address] [place], [place] [address]   Blood Culture (routine x 2)     Status: Abnormal   Collection Time: 06/01/22 12:34 PM   Specimen: BLOOD  Result Value Ref Range Status   Specimen Description   Final    BLOOD Blood Culture adequate volume Performed at [facility], [address], [place], [place] [address]    Special Requests   Final    BOTTLES DRAWN AEROBIC AND  ANAEROBIC LEFT ANTECUBITAL Performed at [facility], [address] [place], [place] [address]    Culture  Setup Time   Final    GRAM NEGATIVE RODS ANAEROBIC BOTTLE ONLY CRITICAL RESULT CALLED TO, READ BACK BY AND VERIFIED WITH: NATHAN BELUE AT 0017 ON 06/02/22 BY SS    Culture (A)  Final    ESCHERICHIA COLI Confirmed Extended Spectrum Beta-Lactamase Producer (ESBL).  In bloodstream infections from ESBL organisms, carbapenems are preferred over piperacillin/tazobactam. They are shown to have a lower risk of mortality.    Report Status 06/04/2022 FINAL  Final   Organism ID, Bacteria ESCHERICHIA COLI  Final      Susceptibility   Escherichia coli - MIC*    AMPICILLIN >=32 RESISTANT Resistant     CEFAZOLIN >=64 RESISTANT Resistant     CEFEPIME 16 RESISTANT Resistant     CEFTAZIDIME RESISTANT Resistant     CEFTRIAXONE >=64 RESISTANT Resistant     CIPROFLOXACIN >=4 RESISTANT Resistant     GENTAMICIN <=1 SENSITIVE Sensitive     IMIPENEM <=0.25 SENSITIVE Sensitive     TRIMETH/SULFA >=320 RESISTANT Resistant     AMPICILLIN/SULBACTAM 16 INTERMEDIATE Intermediate     PIP/TAZO <=4 SENSITIVE Sensitive     * ESCHERICHIA COLI  Blood Culture (routine x 2)     Status: None   Collection Time: 06/01/22 12:34 PM   Specimen: BLOOD  Result Value Ref Range Status   Specimen Description    Final    BLOOD Blood Culture results may not be optimal due to an inadequate volume of blood received in culture bottles   Special Requests BOTTLES DRAWN AEROBIC AND ANAEROBIC WRIST  Final   Culture   Final    NO GROWTH 5 DAYS Performed at [facility], [address] [place], [place] [address]    Report Status 06/06/2022 FINAL  Final  Blood Culture ID Panel (Reflexed)     Status: Abnormal   Collection Time: 06/01/22 12:34 PM  Result Value Ref Range Status   Enterococcus faecalis NOT DETECTED NOT DETECTED Final   Enterococcus Faecium NOT DETECTED NOT DETECTED Final   Listeria monocytogenes NOT DETECTED NOT DETECTED Final   Staphylococcus species NOT DETECTED NOT DETECTED Final   Staphylococcus aureus (BCID) NOT DETECTED NOT DETECTED Final   Staphylococcus epidermidis NOT DETECTED NOT DETECTED Final   Staphylococcus lugdunensis NOT DETECTED NOT DETECTED Final   Streptococcus species NOT DETECTED NOT DETECTED Final   Streptococcus agalactiae NOT DETECTED NOT DETECTED Final   Streptococcus pneumoniae NOT DETECTED NOT DETECTED Final   Streptococcus pyogenes NOT DETECTED NOT DETECTED Final   A.calcoaceticus-baumannii NOT DETECTED NOT DETECTED Final   Bacteroides fragilis NOT DETECTED NOT DETECTED Final   Enterobacterales DETECTED (A) NOT DETECTED Final    Comment: Enterobacterales represent a large order of gram negative bacteria, not a single organism. CRITICAL RESULT CALLED TO, READ BACK BY AND VERIFIED WITH: NATHAN BELUE AT 0017 ON 06/02/22 BY SS    Enterobacter cloacae complex NOT DETECTED NOT DETECTED Final   Escherichia coli DETECTED (A) NOT DETECTED Final    Comment: CRITICAL RESULT CALLED TO, READ BACK BY AND VERIFIED WITH: NATHAN BELUE AT 0017 ON 06/02/22 BY SS    Klebsiella aerogenes NOT DETECTED NOT DETECTED Final   Klebsiella oxytoca NOT DETECTED NOT DETECTED Final   Klebsiella pneumoniae NOT DETECTED NOT DETECTED Final   Proteus species NOT DETECTED NOT DETECTED  Final   Salmonella species NOT DETECTED NOT DETECTED Final   Serratia marcescens NOT DETECTED  NOT DETECTED Final   Haemophilus influenzae NOT DETECTED NOT DETECTED Final   Neisseria meningitidis NOT DETECTED NOT DETECTED Final   Pseudomonas aeruginosa NOT DETECTED NOT DETECTED Final   Stenotrophomonas maltophilia NOT DETECTED NOT DETECTED Final   Candida albicans NOT DETECTED NOT DETECTED Final   Candida auris NOT DETECTED NOT DETECTED Final   Candida glabrata NOT DETECTED NOT DETECTED Final   Candida krusei NOT DETECTED NOT DETECTED Final   Candida parapsilosis NOT DETECTED NOT DETECTED Final   Candida tropicalis NOT DETECTED NOT DETECTED Final   Cryptococcus neoformans/gattii NOT DETECTED NOT DETECTED Final   CTX-M ESBL DETECTED (A) NOT DETECTED Final    Comment: CRITICAL RESULT CALLED TO, READ BACK BY AND VERIFIED WITH: NATHAN BELUE AT 0017 ON 06/02/22 BY SS (NOTE) Extended spectrum beta-lactamase detected. Recommend a carbapenem as initial therapy.      Carbapenem resistance IMP NOT DETECTED NOT DETECTED Final   Carbapenem resistance KPC NOT DETECTED NOT DETECTED Final   Carbapenem resistance NDM NOT DETECTED NOT DETECTED Final   Carbapenem resist OXA 48 LIKE NOT DETECTED NOT DETECTED Final   Carbapenem resistance VIM NOT DETECTED NOT DETECTED Final    Comment: Performed at [facility], [address] [place], [place] [address]  Urine Culture     Status: None   Collection Time: 06/01/22  1:06 PM   Specimen: In/Out Cath Urine  Result Value Ref Range Status   Specimen Description   Final    IN/OUT CATH URINE Performed at [facility], [address], [place], [place] [address]    Special Requests   Final    NONE Performed at [facility], [address], [place], [place] [address]    Culture   Final    NO GROWTH Performed at [facility], [address], [place], [place] [address]    Report Status 06/02/2022 FINAL  Final  Aerobic  Culture w Gram Stain (superficial specimen)     Status: None   Collection Time: 06/01/22  4:35 PM   Specimen: Wound  Result Value Ref Range Status   Specimen Description   Final    WOUND Performed at [facility], [address], [place], [place] [address]    Special Requests   Final    RT KNEE Performed at [facility], [address] [place], [place] [address]    Gram Stain   Final    RARE WBC PRESENT, PREDOMINANTLY PMN RARE GRAM NEGATIVE RODS    Culture   Final    FEW KLEBSIELLA OXYTOCA FEW ACINETOBACTER CALCOACETICUS/BAUMANNII COMPLEX RARE PSEUDOMONAS AERUGINOSA Confirmed Extended Spectrum Beta-Lactamase Producer (ESBL).  In bloodstream infections from ESBL organisms, carbapenems are preferred over piperacillin/tazobactam. They are shown to have a lower risk of mortality. FOR KLEBSIELLA Performed at [facility], [address] [place], [place] [address]    Report Status 06/07/2022 FINAL  Final   Organism ID, Bacteria ACINETOBACTER CALCOACETICUS/BAUMANNII COMPLEX  Final   Organism ID, Bacteria KLEBSIELLA OXYTOCA  Final   Organism ID, Bacteria PSEUDOMONAS AERUGINOSA  Final      Susceptibility   Acinetobacter calcoaceticus/baumannii complex - MIC*    CEFTAZIDIME 8 SENSITIVE Sensitive     CIPROFLOXACIN 1 SENSITIVE Sensitive     GENTAMICIN <=1 SENSITIVE Sensitive     IMIPENEM <=0.25 SENSITIVE Sensitive     PIP/TAZO 8 SENSITIVE Sensitive     TRIMETH/SULFA >=320 RESISTANT Resistant     AMPICILLIN/SULBACTAM <=2 SENSITIVE Sensitive     *  FEW ACINETOBACTER CALCOACETICUS/BAUMANNII COMPLEX   Klebsiella oxytoca - MIC*    AMPICILLIN >=32 RESISTANT Resistant     CEFAZOLIN >=64 RESISTANT Resistant     CEFEPIME >=32 RESISTANT Resistant     CEFTAZIDIME RESISTANT Resistant     CEFTRIAXONE >=64 RESISTANT Resistant     CIPROFLOXACIN 1 RESISTANT Resistant     GENTAMICIN >=16 RESISTANT Resistant     IMIPENEM <=0.25 SENSITIVE Sensitive     TRIMETH/SULFA >=320  RESISTANT Resistant     AMPICILLIN/SULBACTAM >=32 RESISTANT Resistant     PIP/TAZO 16 SENSITIVE Sensitive     * FEW KLEBSIELLA OXYTOCA   Pseudomonas aeruginosa - MIC*    CEFTAZIDIME 4 SENSITIVE Sensitive     CIPROFLOXACIN <=0.25 SENSITIVE Sensitive     GENTAMICIN <=1 SENSITIVE Sensitive     IMIPENEM 2 SENSITIVE Sensitive     PIP/TAZO 8 SENSITIVE Sensitive     CEFEPIME 2 SENSITIVE Sensitive     * RARE PSEUDOMONAS AERUGINOSA  MRSA Next Gen by PCR, Nasal     Status: None   Collection Time: 06/01/22  9:00 PM   Specimen: Nasal Mucosa; Nasal Swab  Result Value Ref Range Status   MRSA by PCR Next Gen NOT DETECTED NOT DETECTED Final    Comment: (NOTE) The GeneXpert MRSA Assay (FDA approved for NASAL specimens only), is one component of a comprehensive MRSA colonization surveillance program. It is not intended to diagnose MRSA infection nor to guide or monitor treatment for MRSA infections. Test performance is not FDA approved in patients less than 52 years old. Performed at [facility], [address] [place], [place] [address]   Aerobic/Anaerobic Culture w Gram Stain (surgical/deep wound)     Status: None   Collection Time: 06/02/22  2:37 PM   Specimen: PATH Cytology Peritoneal fluid  Result Value Ref Range Status   Specimen Description   Final    PERITONEAL Performed at [facility], [address], [place], [place] [address]    Special Requests   Final    NONE Performed at [facility], [address] [place], [place] [address]    Gram Stain   Final    FEW WBC PRESENT,BOTH PMN AND MONONUCLEAR NO ORGANISMS SEEN    Culture   Final    No growth aerobically or anaerobically. Performed at [facility], [address] [place], [place] [address]    Report Status 06/07/2022 FINAL  Final  Acid Fast Smear (AFB)     Status: None   Collection Time: 06/02/22  2:37 PM   Specimen: PATH Cytology Peritoneal fluid  Result Value Ref Range Status    AFB Specimen Processing Concentration  Final   Acid Fast Smear Negative  Final    Comment: (NOTE) Performed At: [facility] [address] [place], [place] [id number] Jolene Schimke MD MV:[id number]    Source (AFB) PERI  Final    Comment: Performed at [facility], [address] [place], [place] [address]  Culture, blood (Routine X 2) w Reflex to ID Panel     Status: None (Preliminary result)   Collection Time: 06/05/22  4:14 AM   Specimen: BLOOD  Result Value Ref Range Status   Specimen Description BLOOD BLOOD RIGHT HAND  Final   Special Requests   Final    BOTTLES DRAWN AEROBIC AND ANAEROBIC Blood Culture adequate volume   Culture   Final    NO GROWTH 3 DAYS Performed at [facility], [address]  [place], [place] [address]    Report Status PENDING  Incomplete  Culture, blood (Routine X 2) w Reflex to ID Panel     Status: None (Preliminary result)   Collection Time: 06/05/22  4:14 AM   Specimen: BLOOD  Result Value Ref Range Status   Specimen Description BLOOD BLOOD LEFT HAND  Final   Special Requests   Final    IN PEDIATRIC BOTTLE Blood Culture results may not be optimal due to an excessive volume of blood received in culture bottles   Culture   Final    NO GROWTH 3 DAYS Performed at [facility], [address] [place], [place] [address]    Report Status PENDING  Incomplete         Radiology Studies: No results found.      Scheduled Meds:  ferrous sulfate  325 mg Oral Q breakfast   folic acid  1 mg Oral Daily   furosemide  40 mg Oral BID   gabapentin  300 mg Oral TID   methocarbamol  750 mg Oral TID   nicotine  21 mg Transdermal Daily   nystatin  5 mL Oral QID   sodium chloride flush  3 mL Intravenous Q12H   spironolactone  100 mg Oral Daily   thiamine (VITAMIN B1) injection  100 mg Intravenous Daily   Continuous Infusions:  sodium chloride Stopped (06/06/22 0521)   albumin human     meropenem (MERREM) IV 1  g (06/08/22 0206)     LOS: 7 days     Tresa MooreSudheer B Kristie Bracewell, MD [facility]   If 7PM-7AM, please contact night-coverage  06/08/2022, 10:42 AM

## 2022-06-08 NOTE — Consult Note (Addendum)
WOC consult requested by the primary team for possible removal of incisional Vac. Ortho team is following for assessment and plan of care. According to Dr Marilynne Drivers progress notes ; "I would continue the wound VAC until his discharge. " Their team has provided topical treatment orders for the bedside nurses as follows: Beginning on 06/09/22 or at time of discharge:  Discontinue NPWT dressing and cleanse wound with NS, pat dry. Cover with folded piece of xeroform gauze [place] # [phone]), top with dry gauze and secure with silicone foam dressing. Xeroform is to be changed daily. Bedside RN to teach topical care. No further role for WOC team. Please refer to ortho service for assessment for further questions. Please re-consult if further assistance is needed.  Thank-you,  Cammie Mcgee MSN, RN, CWOCN, [place], CNS [phone]

## 2022-06-08 NOTE — Progress Notes (Signed)
   Date of Admission:  06/01/2022     ID: Marco Spears is a 46 y.o. male  Principal Problem:   AMS (altered mental status) Active Problems:   Alcoholic cirrhosis of liver with ascites (HCC)   Hypokalemia   Anemia   Polysubstance abuse (HCC)   Septic arthritis (HCC)   Thrombocytopenia (HCC)   Portal hypertension (HCC)   Bacteremia    Subjective: Pt had paracentesis today Resting Some abdominal pain   Medications:   ferrous sulfate  325 mg Oral Q breakfast   folic acid  1 mg Oral Daily   furosemide  40 mg Oral BID   gabapentin  300 mg Oral TID   methocarbamol  750 mg Oral TID   nicotine  21 mg Transdermal Daily   nystatin  5 mL Oral QID   sodium chloride flush  3 mL Intravenous Q12H   spironolactone  100 mg Oral Daily   thiamine (VITAMIN B1) injection  100 mg Intravenous Daily    Objective: Vital signs in last 24 hours: Temp:  [98.2 F (36.8 C)-99.1 F (37.3 C)] 99.1 F (37.3 C) (08/14 0824) Pulse Rate:  [84-98] 88 (08/14 0824) Resp:  [18-20] 18 (08/14 0824) BP: (86-113)/(48-69) 86/48 (08/14 0824) SpO2:  [97 %-100 %] 98 % (08/14 0824)    PHYSICAL EXAM:  General:resting in bed Lungs: b/l air entry- decreased bases Heart: s1s2 Abdomen: Soft,  bruising Extremities: rt knee wound covered with wound vac Skin: No rashes or lesions. Or bruising Lymph: Cervical, supraclavicular normal. Neurologic: Grossly non-focal  Lab Results Recent Labs    06/07/22 0740  WBC 3.8*  HGB 8.5*  HCT 25.2*  NA 136  K 4.1  CL 106  CO2 26  BUN 11  CREATININE 0.62   Liver Panel No results for input(s): "PROT", "ALBUMIN", "AST", "ALT", "ALKPHOS", "BILITOT", "BILIDIR", "IBILI" in the last 72 hours.  Microbiology: 06/01/22 BC- ESBL E.coli WC- Kleb oxytoca , acinetobacter Studies/Results: No results found.   Assessment/Plan: ?ESBL ecoli bacteremia- on meropenem Gram neg seen on the rt knee  klebsiella and acinetobacter  could be  contaminants   Recent strep pyogenes  infection of rt prepatellar bursa, then had MRSA infection and now gram neg rod   Encephalopathy from SUD- resolved   Decompensated cirrhosis with ascites Repeat Paracentesis done Cell count much improved Culture neg so far   Spontaneous bacteria peritonitis- on meropenem    Anemia Thrombocytopenia Hyperbilirubinemia Leucopenia All of the above due to decompensated cirrhosis ? Crystal meth use   Discussed the management with patient and care team

## 2022-06-08 NOTE — Progress Notes (Signed)
Subjective:  Patient laying on his right side.  Had paracentesis recently this morning.  Patient still drowsy.  No acute distress.  Objective:   VITALS:   Vitals:   06/08/22 0459 06/08/22 0824 06/08/22 1351 06/08/22 1524  BP: (!) 110/55 (!) 86/48 125/70 114/66  Pulse: 98 88 82 93  Resp: 20 18 18 18   Temp: 98.5 F (36.9 C) 99.1 F (37.3 C)  98.5 F (36.9 C)  TempSrc: Oral Oral  Oral  SpO2: 97% 98% 99% 99%  Weight:      Height:        PHYSICAL EXAM: Right knee: Neurovascular intact Sensation intact distally with paresthesias in both feet likely peripheral neuropathy Intact pulses distally Dorsiflexion/Plantar flexion intact Incision: dressing C/D/I No cellulitis present Compartment soft  LABS  Results for orders placed or performed during the hospital encounter of 06/01/22 (from the past 24 hour(s))  Body fluid cell count with differential     Status: Abnormal   Collection Time: 06/08/22 11:39 AM  Result Value Ref Range   Fluid Type-FCT CYTO PERI    Color, Fluid YELLOW (A) YELLOW   Appearance, Fluid CLEAR CLEAR   Total Nucleated Cell Count, Fluid 352 cu mm   Neutrophil Count, Fluid 50 %   Lymphs, Fluid 18 %   Monocyte-Macrophage-Serous Fluid 32 %   Eos, Fluid 0 %  Albumin, pleural or peritoneal fluid      Status: None   Collection Time: 06/08/22 11:39 AM  Result Value Ref Range   Albumin, Fluid <1.5 g/dL   Fluid Type-FALB PERITONEAL   Pathologist smear review     Status: None   Collection Time: 06/08/22 11:39 AM  Result Value Ref Range   Path Review Peritoneal fluid cytospin specimen is reviewed.     06/10/22 Paracentesis  Result Date: 06/08/2022 INDICATION: Patient with history of alcoholic cirrhosis with recurrent ascites. Request is for therapeutic and diagnostic paracentesis EXAM: ULTRASOUND GUIDED THERAPEUTIC AND DIAGNOSTIC PARACENTESIS MEDICATIONS: Lidocaine 1% 10 mL COMPLICATIONS: None immediate. PROCEDURE: Informed written consent was obtained from the  patient after a discussion of the risks, benefits and alternatives to treatment. A timeout was performed prior to the initiation of the procedure. Initial ultrasound scanning demonstrates a moderate amount of ascites within the right lower abdominal quadrant. The right lower abdomen was prepped and draped in the usual sterile fashion. 1% lidocaine was used for local anesthesia. Following this, a 19 gauge, 7-cm, Yueh catheter was introduced. An ultrasound image was saved for documentation purposes. The paracentesis was performed. The catheter was removed and a dressing was applied. The patient tolerated the procedure well without immediate post procedural complication. FINDINGS: A total of approximately 4.4 L of straw-colored fluid was removed. Samples were sent to the laboratory as requested by the clinical team. IMPRESSION: Successful ultrasound-guided therapeutic and diagnostic paracentesis yielding 4.4 liters of peritoneal fluid. Read by: 06/10/2022, NP PLAN: The patient has required >/=2 paracenteses in a 30 day period and a formal evaluation by the [facility] has been arranged. Electronically Signed   By: [facility] M.D.   On: 06/08/2022 12:59    Assessment/Plan:     Principal Problem:   AMS (altered mental status) Active Problems:   Alcoholic cirrhosis of liver with ascites (HCC)   Hypokalemia   Anemia   Polysubstance abuse (HCC)   Septic arthritis (HCC)   Thrombocytopenia (HCC)   Portal hypertension (HCC)   Bacteremia  Patient doing well from an orthopaedic standpoint.  May be discharged when cleared medically.  Discontinue vac dressing upon discharge.   Oral antibiotics per ID.  Follow up with [facility] 10 days after discharge.      Juanell Fairly , MD 06/08/2022, 3:24 PM

## 2022-06-09 LAB — COMPREHENSIVE METABOLIC PANEL
ALT: 55 U/L — ABNORMAL HIGH (ref 0–44)
AST: 62 U/L — ABNORMAL HIGH (ref 15–41)
Albumin: 2.5 g/dL — ABNORMAL LOW (ref 3.5–5.0)
Alkaline Phosphatase: 142 U/L — ABNORMAL HIGH (ref 38–126)
Anion gap: 5 (ref 5–15)
BUN: 14 mg/dL (ref 6–20)
CO2: 26 mmol/L (ref 22–32)
Calcium: 8 mg/dL — ABNORMAL LOW (ref 8.9–10.3)
Chloride: 103 mmol/L (ref 98–111)
Creatinine, Ser: 0.59 mg/dL — ABNORMAL LOW (ref 0.61–1.24)
GFR, Estimated: >60 mL/min (ref >60)
Glucose, Bld: 140 mg/dL — ABNORMAL HIGH (ref 70–99)
Potassium: 3.9 mmol/L (ref 3.5–5.1)
Sodium: 134 mmol/L — ABNORMAL LOW (ref 135–145)
Total Bilirubin: 2.2 mg/dL — ABNORMAL HIGH (ref 0.3–1.2)
Total Protein: 5.1 g/dL — ABNORMAL LOW (ref 6.5–8.1)

## 2022-06-09 LAB — CBC WITH DIFFERENTIAL/PLATELET
Abs Immature Granulocytes: 0.04 10*3/uL (ref 0.00–0.07)
Basophils Absolute: 0 10*3/uL (ref 0.0–0.1)
Basophils Relative: 1 %
Eosinophils Absolute: 0.2 10*3/uL (ref 0.0–0.5)
Eosinophils Relative: 5 %
HCT: 27.2 % — ABNORMAL LOW (ref 39.0–52.0)
Hemoglobin: 8.9 g/dL — ABNORMAL LOW (ref 13.0–17.0)
Immature Granulocytes: 1 %
Lymphocytes Relative: 17 %
Lymphs Abs: 0.5 10*3/uL — ABNORMAL LOW (ref 0.7–4.0)
MCH: 33 pg (ref 26.0–34.0)
MCHC: 32.7 g/dL (ref 30.0–36.0)
MCV: 100.7 fL — ABNORMAL HIGH (ref 80.0–100.0)
Monocytes Absolute: 0.4 10*3/uL (ref 0.1–1.0)
Monocytes Relative: 13 %
Neutro Abs: 1.7 10*3/uL (ref 1.7–7.7)
Neutrophils Relative %: 63 %
Platelets: 75 10*3/uL — ABNORMAL LOW (ref 150–400)
RBC: 2.7 MIL/uL — ABNORMAL LOW (ref 4.22–5.81)
RDW: 16.5 % — ABNORMAL HIGH (ref 11.5–15.5)
WBC: 2.8 10*3/uL — ABNORMAL LOW (ref 4.0–10.5)
nRBC: 0 % (ref 0.0–0.2)

## 2022-06-09 MED ORDER — SENNOSIDES-DOCUSATE SODIUM 8.6-50 MG PO TABS
1.0000 | ORAL_TABLET | Freq: Two times a day (BID) | ORAL | Status: DC
  Administered 2022-06-09 – 2022-06-14 (×9): 1 via ORAL
  Filled 2022-06-09 (×12): qty 1

## 2022-06-09 MED ORDER — BISACODYL 10 MG RE SUPP
10.0000 mg | Freq: Every day | RECTAL | Status: DC | PRN
  Administered 2022-06-10: 10 mg via RECTAL
  Filled 2022-06-09: qty 1

## 2022-06-09 MED ORDER — ENSURE ENLIVE PO LIQD
237.0000 mL | Freq: Three times a day (TID) | ORAL | Status: DC
  Administered 2022-06-09 – 2022-06-14 (×14): 237 mL via ORAL

## 2022-06-09 MED ORDER — POLYETHYLENE GLYCOL 3350 17 G PO PACK
17.0000 g | PACK | Freq: Every day | ORAL | Status: DC
  Administered 2022-06-09 – 2022-06-11 (×3): 17 g via ORAL
  Filled 2022-06-09 (×6): qty 1

## 2022-06-09 MED ORDER — ADULT MULTIVITAMIN W/MINERALS CH
1.0000 | ORAL_TABLET | Freq: Every day | ORAL | Status: DC
  Administered 2022-06-10 – 2022-06-15 (×6): 1 via ORAL
  Filled 2022-06-09 (×6): qty 1

## 2022-06-09 MED ORDER — POLYETHYLENE GLYCOL 3350 17 G PO PACK
17.0000 g | PACK | Freq: Once | ORAL | Status: AC
  Administered 2022-06-09: 17 g via ORAL
  Filled 2022-06-09: qty 1

## 2022-06-09 NOTE — Progress Notes (Signed)
Subjective:  Patient seen sitting up in bed eating lunch.  Denies significant knee pain.  Objective:   VITALS:   Vitals:   06/08/22 1351 06/08/22 1524 06/08/22 1933 06/09/22 0336  BP: 125/70 114/66 111/68 104/64  Pulse: 82 93 91 89  Resp: 18 18 16 20   Temp:  98.5 F (36.9 C) 98.4 F (36.9 C) 98.5 F (36.9 C)  TempSrc:  Oral Oral Oral  SpO2: 99% 99% 99% 96%  Weight:      Height:        PHYSICAL EXAM: Right knee/lower extremity Neurovascular intact Sensation intact distally Intact pulses distally Dorsiflexion/Plantar flexion intact I personally removed vac dressing today and changed to gauze dressing.   No active drainage seen. No cellulitis present Compartment soft  LABS  Results for orders placed or performed during the hospital encounter of 06/01/22 (from the past 24 hour(s))  CBC with Differential/Platelet     Status: Abnormal   Collection Time: 06/09/22 10:36 AM  Result Value Ref Range   WBC 2.8 (L) 4.0 - 10.5 K/uL   RBC 2.70 (L) 4.22 - 5.81 MIL/uL   Hemoglobin 8.9 (L) 13.0 - 17.0 g/dL   HCT 06/11/22 (L) 16.6 - 06.3 %   MCV 100.7 (H) 80.0 - 100.0 fL   MCH 33.0 26.0 - 34.0 pg   MCHC 32.7 30.0 - 36.0 g/dL   RDW 01.6 (H) 01.0 - 93.2 %   Platelets 75 (L) 150 - 400 K/uL   nRBC 0.0 0.0 - 0.2 %   Neutrophils Relative % 63 %   Neutro Abs 1.7 1.7 - 7.7 K/uL   Lymphocytes Relative 17 %   Lymphs Abs 0.5 (L) 0.7 - 4.0 K/uL   Monocytes Relative 13 %   Monocytes Absolute 0.4 0.1 - 1.0 K/uL   Eosinophils Relative 5 %   Eosinophils Absolute 0.2 0.0 - 0.5 K/uL   Basophils Relative 1 %   Basophils Absolute 0.0 0.0 - 0.1 K/uL   Immature Granulocytes 1 %   Abs Immature Granulocytes 0.04 0.00 - 0.07 K/uL  Comprehensive metabolic panel     Status: Abnormal   Collection Time: 06/09/22 10:36 AM  Result Value Ref Range   Sodium 134 (L) 135 - 145 mmol/L   Potassium 3.9 3.5 - 5.1 mmol/L   Chloride 103 98 - 111 mmol/L   CO2 26 22 - 32 mmol/L   Glucose, Bld 140 (H) 70 - 99  mg/dL   BUN 14 6 - 20 mg/dL   Creatinine, Ser 06/11/22 (L) 0.61 - 1.24 mg/dL   Calcium 8.0 (L) 8.9 - 10.3 mg/dL   Total Protein 5.1 (L) 6.5 - 8.1 g/dL   Albumin 2.5 (L) 3.5 - 5.0 g/dL   AST 62 (H) 15 - 41 U/L   ALT 55 (H) 0 - 44 U/L   Alkaline Phosphatase 142 (H) 38 - 126 U/L   Total Bilirubin 2.2 (H) 0.3 - 1.2 mg/dL   GFR, Estimated 7.32 >20 mL/min   Anion gap 5 5 - 15    >25 Paracentesis  Result Date: 06/08/2022 INDICATION: Patient with history of alcoholic cirrhosis with recurrent ascites. Request is for therapeutic and diagnostic paracentesis EXAM: ULTRASOUND GUIDED THERAPEUTIC AND DIAGNOSTIC PARACENTESIS MEDICATIONS: Lidocaine 1% 10 mL COMPLICATIONS: None immediate. PROCEDURE: Informed written consent was obtained from the patient after a discussion of the risks, benefits and alternatives to treatment. A timeout was performed prior to the initiation of the procedure. Initial ultrasound scanning demonstrates a moderate amount of ascites within  the right lower abdominal quadrant. The right lower abdomen was prepped and draped in the usual sterile fashion. 1% lidocaine was used for local anesthesia. Following this, a 19 gauge, 7-cm, Yueh catheter was introduced. An ultrasound image was saved for documentation purposes. The paracentesis was performed. The catheter was removed and a dressing was applied. The patient tolerated the procedure well without immediate post procedural complication. FINDINGS: A total of approximately 4.4 L of straw-colored fluid was removed. Samples were sent to the laboratory as requested by the clinical team. IMPRESSION: Successful ultrasound-guided therapeutic and diagnostic paracentesis yielding 4.4 liters of peritoneal fluid. Read by: Anders Grant, NP PLAN: The patient has required >/=2 paracenteses in a 30 day period and a formal evaluation by the [facility] Inter[facility] Radiology [facility] Hypertension [facility] has been arranged. Electronically Signed   By: Marliss Coots  M.D.   On: 06/08/2022 12:59    Assessment/Plan:     Principal Problem:   AMS (altered mental status) Active Problems:   Alcoholic cirrhosis of liver with ascites (HCC)   Hypokalemia   Anemia   Polysubstance abuse (HCC)   Septic arthritis (HCC)   Thrombocytopenia (HCC)   Portal hypertension (HCC)   Bacteremia  Patient doing well from an orthopaedic standpoint.  May be discharged on oral antibiotics per ID/medicine.  Follow up in my office in 7-10 days.  Patient should be provided supplies to change dressings as necessary.  Patient instructed on the importance with compliance with his antibiotics.  Persistent infection could result in osteomyelitis, loss of limb or life.    Juanell Fairly , MD 06/09/2022, 1:09 PM

## 2022-06-09 NOTE — Progress Notes (Signed)
       CROSS COVER NOTE  NAME: Marco Spears MRN: [id number] DOB : [birth year]    Date of Service   06/09/22  HPI/Events of Note   Medication request received from patient for report of constipation. Last bowel movement was 8/13.  Interventions   Plan: Miralax     This document was prepared using Dragon voice recognition software and may include unintentional dictation errors.  Bishop Limbo DNP, MHA, FNP-BC Nurse Practitioner [facility] Pager [phone]

## 2022-06-09 NOTE — Progress Notes (Signed)
PROGRESS NOTE    Marco Spears  VWU:[id number] DOB: 1976/07/26 DOA: 06/01/2022 PCP: Pcp, No    Brief Narrative:  46 y.o. male seen in ed brought by EMS for altered mental status where patient was picked up from a home where he had been on meth.  Patient's right knee incision had clear drainage per report patient was intermittently arousable but restless agitated and almost paranoid.  Patient reports pain over the right knee.  Patient gets startled with sound. Patient is altered and somnolent and not oriented and responds to when called but falls asleep immediately.   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  Patient had received Harvoni for his hep C and has had a DVT for which she has taken Eliquis.  Patient has past medical history of allergies to amoxicillin. Patient had diarrhea right knee recurrent septic prepatellar bursitis and has had I&D on 05/07/2022.  Patient had paracentesis on 05/22/2022 with 3.6 L of hazy yellow fluid.  Last paracentesis was on 05/26/2022 where he had 4.8 L removed. Patient is a large hematoma about 12 cm on his lower abdominal area more towards suprapubic area.   Infectious disease following.  GI signed off as of 8/9.  Home diuretics resumed. Mental status improved on 8/10.  Continues to endorse severe muscle spasms Status post ultrasound-guided paracentesis 8/11.  6 L fluid liberated Status post repeat ultrasound-guided paracentesis 8/14.  4.4 L fluid removed   Assessment & Plan:   Principal Problem:   AMS (altered mental status) Active Problems:   Alcoholic cirrhosis of liver with ascites (HCC)   Anemia   Septic arthritis (HCC)   Hypokalemia   Polysubstance abuse (HCC)   Thrombocytopenia (HCC)   Portal hypertension (HCC)   Bacteremia  Acute metabolic encephalopathy Likely multifactorial.  Patient is a admitted methamphetamine user.  Also has infection  which could be contributing.  Patient remains somewhat lethargic with tangential thought and mumbling.  CT head normal. 8/10: Mental status improved.  Appears to be back to baseline Plan: Continue current plan of care.  Avoid sedatives.  Antibiotics as below  Septic arthritis right knee ESBL E. coli bacteremia Blood culture positive from 8/8.  Infectious disease on board.  Was previously getting vancomycin and Rocephin.  Changed to meropenem.  Orthopedics consulted.  Incisional wound VAC in [facility] by [facility] team.  Application of knee immobilizer. Plan: Continue meropenem ID following Orthopedics following, improving from orthopedic standpoint Continue VAC dressing to right knee Orthopedics likely to discontinue VAC today  Anemia Thrombocytopenia Portal hypertension Spontaneous bacterial peritonitis Hepatic cirrhosis No evidence of bleeding Currently no indication for PRBC or platelet transfusion Will monitor closely for bleed Continue meropenem as above as this will cover for SBP as well Continue home Lasix and Aldactone 8/11: Repeat ultrasound-guided paracentesis ordered 8/11: Ultrasound-guided paracentesis.  6 L fluid removed 8/14: Ultrasound-guided paracentesis.  4.4 L fluid removed  Polysubstance use Counseled patient Avoid narcotics    DVT prophylaxis: SCD Code Status: Full Family Communication: None Disposition Plan: Status is: Inpatient Remains inpatient appropriate because: Right knee septic arthritis on IV antibiotics and wound VAC.  Needs follow-up from orthopedics and infectious disease   Level of care: Med-Surg  Consultants:  ID  Procedures:  Paracentesis Knee arthrocentesis  Antimicrobials: Meropenem   Subjective: Seen and examined.  No complaints this morning  Objective: Vitals:   06/08/22 1351 06/08/22 1524 06/08/22 1933 06/09/22 0336  BP:  125/70 114/66 111/68 104/64  Pulse: 82 93 91 89  Resp: 18 18 16 20   Temp:  98.5 F (36.9 C) 98.4 F  (36.9 C) 98.5 F (36.9 C)  TempSrc:  Oral Oral Oral  SpO2: 99% 99% 99% 96%  Weight:      Height:        Intake/Output Summary (Last 24 hours) at 06/09/2022 1042 Last data filed at 06/09/2022 0255 Gross per 24 hour  Intake --  Output 1550 ml  Net -1550 ml   Filed Weights   06/05/22 0500 06/06/22 0417 06/07/22 0500  Weight: 107.2 kg 110.7 kg 108.9 kg    Examination:  General exam: NAD.  Appears fatigued Respiratory system: Lungs clear.  Normal work of breathing.  Room air Cardiovascular system: S1-S2, RRR, no murmurs, no pedal edema Gastrointestinal system: Soft, mild distention, decreased bowel sounds Central nervous system: Alert, oriented x 3, no focal deficits Extremities: Unable to assess Skin: No rashes, lesions or ulcers Psychiatry: Judgement and insight appear impaired. Mood & affect appropriate`.     Data Reviewed: I have personally reviewed following labs and imaging studies  CBC: Recent Labs  Lab 06/04/22 0817 06/05/22 0755 06/07/22 0740  WBC 3.3* 2.9* 3.8*  NEUTROABS 2.1 1.8 2.5  HGB 7.9* 7.9* 8.5*  HCT 22.6* 23.1* 25.2*  MCV 97.8 98.7 100.0  PLT 33* 45* 63*   Basic Metabolic Panel: Recent Labs  Lab 06/03/22 0359 06/04/22 0425 06/04/22 0817 06/05/22 0755 06/07/22 0740  NA  --   --  131* 134* 136  K  --   --  2.8* 3.9 4.1  CL  --   --  103 108 106  CO2  --   --  23 23 26   GLUCOSE  --   --  152* 123* 115*  BUN  --   --  11 11 11   CREATININE 0.73 0.75 0.63 0.59* 0.62  CALCIUM  --   --  7.6* 7.6* 8.0*   GFR: Estimated Creatinine Clearance: 150.6 mL/min (by C-G formula based on SCr of 0.62 mg/dL). Liver Function Tests: Recent Labs  Lab 06/04/22 0817  AST 49*  ALT 30  ALKPHOS 67  BILITOT 2.2*  PROT 4.9*  ALBUMIN 2.6*   No results for input(s): "LIPASE", "AMYLASE" in the last 168 hours. Recent Labs  Lab 06/02/22 1058  AMMONIA 36*   Coagulation Profile: No results for input(s): "INR", "PROTIME" in the last 168 hours.  Cardiac  Enzymes: Recent Labs  Lab 06/03/22 0359  CKTOTAL 59   BNP (last 3 results) No results for input(s): "PROBNP" in the last 8760 hours. HbA1C: No results for input(s): "HGBA1C" in the last 72 hours. CBG: Recent Labs  Lab 06/03/22 1933 06/04/22 0749  GLUCAP 118* 140*   Lipid Profile: No results for input(s): "CHOL", "HDL", "LDLCALC", "TRIG", "CHOLHDL", "LDLDIRECT" in the last 72 hours. Thyroid Function Tests: No results for input(s): "TSH", "T4TOTAL", "FREET4", "T3FREE", "THYROIDAB" in the last 72 hours.  Anemia Panel: No results for input(s): "VITAMINB12", "FOLATE", "FERRITIN", "TIBC", "IRON", "RETICCTPCT" in the last 72 hours. Sepsis Labs: Recent Labs  Lab 06/03/22 0359  PROCALCITON 1.79    Recent Results (from the past 240 hour(s))  Resp Panel by RT-PCR (Flu A&B, Covid) Anterior Nasal Swab     Status: None   Collection Time: 06/01/22 12:34 PM   Specimen: Anterior Nasal Swab  Result Value Ref Range Status   SARS Coronavirus 2 by RT PCR NEGATIVE NEGATIVE Final    Comment: (NOTE) SARS-CoV-2  target nucleic acids are NOT DETECTED.  The SARS-CoV-2 RNA is generally detectable in upper respiratory specimens during the acute phase of infection. The lowest concentration of SARS-CoV-2 viral copies this assay can detect is 138 copies/mL. A negative result does not preclude SARS-Cov-2 infection and should not be used as the sole basis for treatment or other patient management decisions. A negative result may occur with  improper specimen collection/handling, submission of specimen other than nasopharyngeal swab, presence of viral mutation(s) within the areas targeted by this assay, and inadequate number of viral copies(<138 copies/mL). A negative result must be combined with clinical observations, patient history, and epidemiological information. The expected result is Negative.  Fact Sheet for Patients:  BloggerCourse.com  Fact Sheet for Healthcare  Providers:  SeriousBroker.it  This test is no t yet approved or cleared by the [place] FDA and  has been authorized for detection and/or diagnosis of SARS-CoV-2 by FDA under an Emergency Use Authorization (EUA). This EUA will remain  in effect (meaning this test can be used) for the duration of the COVID-19 declaration under Section 564(b)(1) of the Act, 21 U.S.C.section 360bbb-3(b)(1), unless the authorization is terminated  or revoked sooner.       Influenza A by PCR NEGATIVE NEGATIVE Final   Influenza B by PCR NEGATIVE NEGATIVE Final    Comment: (NOTE) The Xpert Xpress SARS-CoV-2/FLU/RSV plus assay is intended as an aid in the diagnosis of influenza from Nasopharyngeal swab specimens and should not be used as a sole basis for treatment. Nasal washings and aspirates are unacceptable for Xpert Xpress SARS-CoV-2/FLU/RSV testing.  Fact Sheet for Patients: BloggerCourse.com  Fact Sheet for Healthcare Providers: SeriousBroker.it  This test is not yet approved or cleared by the [place] FDA and has been authorized for detection and/or diagnosis of SARS-CoV-2 by FDA under an Emergency Use Authorization (EUA). This EUA will remain in effect (meaning this test can be used) for the duration of the COVID-19 declaration under Section 564(b)(1) of the Act, 21 U.S.C. section 360bbb-3(b)(1), unless the authorization is terminated or revoked.  Performed at [facility], [address] [place], [place] [address]   Blood Culture (routine x 2)     Status: Abnormal   Collection Time: 06/01/22 12:34 PM   Specimen: BLOOD  Result Value Ref Range Status   Specimen Description   Final    BLOOD Blood Culture adequate volume Performed at [facility], [address] [place], [place] [address]    Special Requests   Final    BOTTLES DRAWN AEROBIC AND ANAEROBIC LEFT ANTECUBITAL Performed at  [facility], [address] [place], [place] [address]    Culture  Setup Time   Final    GRAM NEGATIVE RODS ANAEROBIC BOTTLE ONLY CRITICAL RESULT CALLED TO, READ BACK BY AND VERIFIED WITH: NATHAN BELUE AT 0017 ON 06/02/22 BY SS    Culture (A)  Final    ESCHERICHIA COLI Confirmed Extended Spectrum Beta-Lactamase Producer (ESBL).  In bloodstream infections from ESBL organisms, carbapenems are preferred over piperacillin/tazobactam. They are shown to have a lower risk of mortality.    Report Status 06/04/2022 FINAL  Final   Organism ID, Bacteria ESCHERICHIA COLI  Final      Susceptibility   Escherichia coli - MIC*    AMPICILLIN >=32 RESISTANT Resistant     CEFAZOLIN >=64 RESISTANT Resistant     CEFEPIME 16 RESISTANT Resistant     CEFTAZIDIME RESISTANT Resistant     CEFTRIAXONE >=64 RESISTANT Resistant  CIPROFLOXACIN >=4 RESISTANT Resistant     GENTAMICIN <=1 SENSITIVE Sensitive     IMIPENEM <=0.25 SENSITIVE Sensitive     TRIMETH/SULFA >=320 RESISTANT Resistant     AMPICILLIN/SULBACTAM 16 INTERMEDIATE Intermediate     PIP/TAZO <=4 SENSITIVE Sensitive     * ESCHERICHIA COLI  Blood Culture (routine x 2)     Status: None   Collection Time: 06/01/22 12:34 PM   Specimen: BLOOD  Result Value Ref Range Status   Specimen Description   Final    BLOOD Blood Culture results may not be optimal due to an inadequate volume of blood received in culture bottles   Special Requests BOTTLES DRAWN AEROBIC AND ANAEROBIC WRIST  Final   Culture   Final    NO GROWTH 5 DAYS Performed at [facility], [address] [place], [place] [address]    Report Status 06/06/2022 FINAL  Final  Blood Culture ID Panel (Reflexed)     Status: Abnormal   Collection Time: 06/01/22 12:34 PM  Result Value Ref Range Status   Enterococcus faecalis NOT DETECTED NOT DETECTED Final   Enterococcus Faecium NOT DETECTED NOT DETECTED Final   Listeria monocytogenes NOT DETECTED NOT DETECTED Final    Staphylococcus species NOT DETECTED NOT DETECTED Final   Staphylococcus aureus (BCID) NOT DETECTED NOT DETECTED Final   Staphylococcus epidermidis NOT DETECTED NOT DETECTED Final   Staphylococcus lugdunensis NOT DETECTED NOT DETECTED Final   Streptococcus species NOT DETECTED NOT DETECTED Final   Streptococcus agalactiae NOT DETECTED NOT DETECTED Final   Streptococcus pneumoniae NOT DETECTED NOT DETECTED Final   Streptococcus pyogenes NOT DETECTED NOT DETECTED Final   A.calcoaceticus-baumannii NOT DETECTED NOT DETECTED Final   Bacteroides fragilis NOT DETECTED NOT DETECTED Final   Enterobacterales DETECTED (A) NOT DETECTED Final    Comment: Enterobacterales represent a large order of gram negative bacteria, not a single organism. CRITICAL RESULT CALLED TO, READ BACK BY AND VERIFIED WITH: NATHAN BELUE AT 0017 ON 06/02/22 BY SS    Enterobacter cloacae complex NOT DETECTED NOT DETECTED Final   Escherichia coli DETECTED (A) NOT DETECTED Final    Comment: CRITICAL RESULT CALLED TO, READ BACK BY AND VERIFIED WITH: NATHAN BELUE AT 0017 ON 06/02/22 BY SS    Klebsiella aerogenes NOT DETECTED NOT DETECTED Final   Klebsiella oxytoca NOT DETECTED NOT DETECTED Final   Klebsiella pneumoniae NOT DETECTED NOT DETECTED Final   Proteus species NOT DETECTED NOT DETECTED Final   Salmonella species NOT DETECTED NOT DETECTED Final   Serratia marcescens NOT DETECTED NOT DETECTED Final   Haemophilus influenzae NOT DETECTED NOT DETECTED Final   Neisseria meningitidis NOT DETECTED NOT DETECTED Final   Pseudomonas aeruginosa NOT DETECTED NOT DETECTED Final   Stenotrophomonas maltophilia NOT DETECTED NOT DETECTED Final   Candida albicans NOT DETECTED NOT DETECTED Final   Candida auris NOT DETECTED NOT DETECTED Final   Candida glabrata NOT DETECTED NOT DETECTED Final   Candida krusei NOT DETECTED NOT DETECTED Final   Candida parapsilosis NOT DETECTED NOT DETECTED Final   Candida tropicalis NOT DETECTED NOT  DETECTED Final   Cryptococcus neoformans/gattii NOT DETECTED NOT DETECTED Final   CTX-M ESBL DETECTED (A) NOT DETECTED Final    Comment: CRITICAL RESULT CALLED TO, READ BACK BY AND VERIFIED WITH: NATHAN BELUE AT 0017 ON 06/02/22 BY SS (NOTE) Extended spectrum beta-lactamase detected. Recommend a carbapenem as initial therapy.      Carbapenem resistance IMP NOT DETECTED NOT DETECTED Final   Carbapenem resistance KPC NOT DETECTED  NOT DETECTED Final   Carbapenem resistance NDM NOT DETECTED NOT DETECTED Final   Carbapenem resist OXA 48 LIKE NOT DETECTED NOT DETECTED Final   Carbapenem resistance VIM NOT DETECTED NOT DETECTED Final    Comment: Performed at [facility], [address] [place], [place] [address]  Urine Culture     Status: None   Collection Time: 06/01/22  1:06 PM   Specimen: In/Out Cath Urine  Result Value Ref Range Status   Specimen Description   Final    IN/OUT CATH URINE Performed at [facility], [address] [place] [address] [place], [place] [address]    Special Requests   Final    NONE Performed at [facility], [address] [place], [place] [address]    Culture   Final    NO GROWTH Performed at [facility], [address] [place], [place] [address]    Report Status 06/02/2022 FINAL  Final  Aerobic Culture w Gram Stain (superficial specimen)     Status: None   Collection Time: 06/01/22  4:35 PM   Specimen: Wound  Result Value Ref Range Status   Specimen Description   Final    WOUND Performed at [facility], [address] [place], [place] [address]    Special Requests   Final    RT KNEE Performed at [facility], [address] [place], [place] [address]    Gram Stain   Final    RARE WBC PRESENT, PREDOMINANTLY PMN RARE GRAM NEGATIVE RODS    Culture   Final    FEW KLEBSIELLA OXYTOCA FEW ACINETOBACTER CALCOACETICUS/BAUMANNII COMPLEX RARE PSEUDOMONAS AERUGINOSA Confirmed Extended Spectrum  Beta-Lactamase Producer (ESBL).  In bloodstream infections from ESBL organisms, carbapenems are preferred over piperacillin/tazobactam. They are shown to have a lower risk of mortality. FOR KLEBSIELLA Performed at [facility], [address] [place], [place] [address]    Report Status 06/07/2022 FINAL  Final   Organism ID, Bacteria ACINETOBACTER CALCOACETICUS/BAUMANNII COMPLEX  Final   Organism ID, Bacteria KLEBSIELLA OXYTOCA  Final   Organism ID, Bacteria PSEUDOMONAS AERUGINOSA  Final      Susceptibility   Acinetobacter calcoaceticus/baumannii complex - MIC*    CEFTAZIDIME 8 SENSITIVE Sensitive     CIPROFLOXACIN 1 SENSITIVE Sensitive     GENTAMICIN <=1 SENSITIVE Sensitive     IMIPENEM <=0.25 SENSITIVE Sensitive     PIP/TAZO 8 SENSITIVE Sensitive     TRIMETH/SULFA >=320 RESISTANT Resistant     AMPICILLIN/SULBACTAM <=2 SENSITIVE Sensitive     * FEW ACINETOBACTER CALCOACETICUS/BAUMANNII COMPLEX   Klebsiella oxytoca - MIC*    AMPICILLIN >=32 RESISTANT Resistant     CEFAZOLIN >=64 RESISTANT Resistant     CEFEPIME >=32 RESISTANT Resistant     CEFTAZIDIME RESISTANT Resistant     CEFTRIAXONE >=64 RESISTANT Resistant     CIPROFLOXACIN 1 RESISTANT Resistant     GENTAMICIN >=16 RESISTANT Resistant     IMIPENEM <=0.25 SENSITIVE Sensitive     TRIMETH/SULFA >=320 RESISTANT Resistant     AMPICILLIN/SULBACTAM >=32 RESISTANT Resistant     PIP/TAZO 16 SENSITIVE Sensitive     * FEW KLEBSIELLA OXYTOCA   Pseudomonas aeruginosa - MIC*    CEFTAZIDIME 4 SENSITIVE Sensitive     CIPROFLOXACIN <=0.25 SENSITIVE Sensitive     GENTAMICIN <=1 SENSITIVE Sensitive     IMIPENEM 2 SENSITIVE Sensitive     PIP/TAZO 8 SENSITIVE Sensitive     CEFEPIME 2 SENSITIVE Sensitive     * RARE  PSEUDOMONAS AERUGINOSA  MRSA Next Gen by PCR, Nasal     Status: None   Collection Time: 06/01/22  9:00 PM   Specimen: Nasal Mucosa; Nasal Swab  Result Value Ref Range Status   MRSA by PCR Next Gen NOT DETECTED NOT DETECTED  Final    Comment: (NOTE) The GeneXpert MRSA Assay (FDA approved for NASAL specimens only), is one component of a comprehensive MRSA colonization surveillance program. It is not intended to diagnose MRSA infection nor to guide or monitor treatment for MRSA infections. Test performance is not FDA approved in patients less than 79 years old. Performed at [facility], [address] [place], [place] [address]   Aerobic/Anaerobic Culture w Gram Stain (surgical/deep wound)     Status: None   Collection Time: 06/02/22  2:37 PM   Specimen: PATH Cytology Peritoneal fluid  Result Value Ref Range Status   Specimen Description   Final    PERITONEAL Performed at [facility], [address], [place], [place] [address]    Special Requests   Final    NONE Performed at [facility], [address] [place], [place] [address]    Gram Stain   Final    FEW WBC PRESENT,BOTH PMN AND MONONUCLEAR NO ORGANISMS SEEN    Culture   Final    No growth aerobically or anaerobically. Performed at [facility], [address], [place], [place] [address]    Report Status 06/07/2022 FINAL  Final  Acid Fast Smear (AFB)     Status: None   Collection Time: 06/02/22  2:37 PM   Specimen: PATH Cytology Peritoneal fluid  Result Value Ref Range Status   AFB Specimen Processing Concentration  Final   Acid Fast Smear Negative  Final    Comment: (NOTE) Performed At: [facility] [address] [place], [place] [id number] Jolene Schimke MD QQ:[id number]    Source (AFB) PERI  Final    Comment: Performed at [facility], [address] [place], [place] [address]  Culture, blood (Routine X 2) w Reflex to ID Panel     Status: None (Preliminary result)   Collection Time: 06/05/22  4:14 AM   Specimen: BLOOD  Result Value Ref Range Status   Specimen Description BLOOD BLOOD RIGHT HAND  Final   Special Requests   Final    BOTTLES DRAWN AEROBIC AND ANAEROBIC Blood  Culture adequate volume   Culture   Final    NO GROWTH 4 DAYS Performed at [facility], [address], [place], [place] [address]    Report Status PENDING  Incomplete  Culture, blood (Routine X 2) w Reflex to ID Panel     Status: None (Preliminary result)   Collection Time: 06/05/22  4:14 AM   Specimen: BLOOD  Result Value Ref Range Status   Specimen Description BLOOD BLOOD LEFT HAND  Final   Special Requests   Final    IN PEDIATRIC BOTTLE Blood Culture results may not be optimal due to an excessive volume of blood received in culture bottles   Culture   Final    NO GROWTH 4 DAYS Performed at [facility], [address] [place], [place] [address]    Report Status PENDING  Incomplete  Body fluid culture w Gram Stain     Status: None (Preliminary result)   Collection Time: 06/08/22 11:39 AM   Specimen: PATH Cytology Peritoneal fluid  Result Value Ref Range Status   Specimen Description  Final    PERITONEAL Performed at [facility], [address], [place], [place] [address]    Special Requests   Final    NONE Performed at [facility], [address] [place], [place] [address]    Gram Stain   Final    WBC PRESENT,BOTH PMN AND MONONUCLEAR NO ORGANISMS SEEN CYTOSPIN SMEAR Performed at [facility], [address], [place], [place] [address]    Culture PENDING  Incomplete   Report Status PENDING  Incomplete         Radiology Studies: US Paracentesis  Result Date: 06/08/2022 INDICATION: Patient with history of alcoholic cirrhosis with recurrent ascites. Request is for therapeutic and diagnostic paracentesis EXAM: ULTRASOUND GUIDED THERAPEUTIC AND DIAGNOSTIC PARACENTESIS MEDICATIONS: Lidocaine 1% 10 mL COMPLICATIONS: None immediate. PROCEDURE: Informed written consent was obtained from the patient after a discussion of the risks, benefits and alternatives to treatment. A timeout was performed prior to the initiation of the  procedure. Initial ultrasound scanning demonstrates a moderate amount of ascites within the right lower abdominal quadrant. The right lower abdomen was prepped and draped in the usual sterile fashion. 1% lidocaine was used for local anesthesia. Following this, a 19 gauge, 7-cm, Yueh catheter was introduced. An ultrasound image was saved for documentation purposes. The paracentesis was performed. The catheter was removed and a dressing was applied. The patient tolerated the procedure well without immediate post procedural complication. FINDINGS: A total of approximately 4.4 L of straw-colored fluid was removed. Samples were sent to the laboratory as requested by the clinical team. IMPRESSION: Successful ultrasound-guided therapeutic and diagnostic paracentesis yielding 4.4 liters of peritoneal fluid. Read by: Anders Grant, NP PLAN: The patient has required >/=2 paracenteses in a 30 day period and a formal evaluation by the [facility] has been arranged. Electronically Signed   By: Marliss Coots M.D.   On: 06/08/2022 12:59        Scheduled Meds:  ferrous sulfate  325 mg Oral Q breakfast   folic acid  1 mg Oral Daily   furosemide  40 mg Oral BID   gabapentin  300 mg Oral TID   methocarbamol  750 mg Oral TID   nicotine  21 mg Transdermal Daily   nystatin  5 mL Oral QID   polyethylene glycol  17 g Oral Daily   senna-docusate  1 tablet Oral BID   sodium chloride flush  3 mL Intravenous Q12H   spironolactone  100 mg Oral Daily   thiamine (VITAMIN B1) injection  100 mg Intravenous Daily   Continuous Infusions:  sodium chloride Stopped (06/06/22 0521)   albumin human     meropenem (MERREM) IV 1 g (06/09/22 0934)     LOS: 8 days     Tresa Moore, MD [facility]   If 7PM-7AM, please contact night-coverage  06/09/2022, 10:42 AM

## 2022-06-09 NOTE — Progress Notes (Addendum)
Date of Admission:  06/01/2022     ID: Marco Spears is a 46 y.o. male  Principal Problem:   AMS (altered mental status) Active Problems:   Alcoholic cirrhosis of liver with ascites (HCC)   Hypokalemia   Anemia   Polysubstance abuse (HCC)   Septic arthritis (HCC)   Thrombocytopenia (HCC)   Portal hypertension (HCC)   Bacteremia    Subjective: Pt is feeling better Wants to take shower His only complaint is constipation for 3 days   Medications:   feeding supplement  237 mL Oral TID BM   ferrous sulfate  325 mg Oral Q breakfast   folic acid  1 mg Oral Daily   furosemide  40 mg Oral BID   gabapentin  300 mg Oral TID   methocarbamol  750 mg Oral TID   [START ON 06/10/2022] multivitamin with minerals  1 tablet Oral Daily   nicotine  21 mg Transdermal Daily   nystatin  5 mL Oral QID   polyethylene glycol  17 g Oral Daily   senna-docusate  1 tablet Oral BID   sodium chloride flush  3 mL Intravenous Q12H   spironolactone  100 mg Oral Daily   thiamine (VITAMIN B1) injection  100 mg Intravenous Daily    Objective: Vital signs in last 24 hours: Temp:  [98.4 F (36.9 C)-98.5 F (36.9 C)] 98.5 F (36.9 C) (08/15 0336) Pulse Rate:  [89-91] 89 (08/15 0336) Resp:  [16-20] 20 (08/15 0336) BP: (104-111)/(64-68) 104/64 (08/15 0336) SpO2:  [96 %-99 %] 96 % (08/15 0336)    PHYSICAL EXAM:  General:awake and alert Lungs: b/l air entry- Heart: s1s2 Abdomen: Soft,  bruising Extremities: rt knee wound vac has been removed- now covered with dressing Skin: No rashes or lesions. Or bruising Lymph: Cervical, supraclavicular normal. Neurologic: Grossly non-focal  Lab Results Recent Labs    06/07/22 0740 06/09/22 1036  WBC 3.8* 2.8*  HGB 8.5* 8.9*  HCT 25.2* 27.2*  NA 136 134*  K 4.1 3.9  CL 106 103  CO2 26 26  BUN 11 14  CREATININE 0.62 0.59*   Liver Panel Recent Labs    06/09/22 1036  PROT 5.1*  ALBUMIN 2.5*  AST 62*  ALT 55*  ALKPHOS 142*  BILITOT 2.2*     Microbiology: 06/01/22 BC- ESBL E.coli WC- Kleb oxytoca , acinetobacter Studies/Results: US Paracentesis  Result Date: 06/08/2022 INDICATION: Patient with history of alcoholic cirrhosis with recurrent ascites. Request is for therapeutic and diagnostic paracentesis EXAM: ULTRASOUND GUIDED THERAPEUTIC AND DIAGNOSTIC PARACENTESIS MEDICATIONS: Lidocaine 1% 10 mL COMPLICATIONS: None immediate. PROCEDURE: Informed written consent was obtained from the patient after a discussion of the risks, benefits and alternatives to treatment. A timeout was performed prior to the initiation of the procedure. Initial ultrasound scanning demonstrates a moderate amount of ascites within the right lower abdominal quadrant. The right lower abdomen was prepped and draped in the usual sterile fashion. 1% lidocaine was used for local anesthesia. Following this, a 19 gauge, 7-cm, Yueh catheter was introduced. An ultrasound image was saved for documentation purposes. The paracentesis was performed. The catheter was removed and a dressing was applied. The patient tolerated the procedure well without immediate post procedural complication. FINDINGS: A total of approximately 4.4 L of straw-colored fluid was removed. Samples were sent to the laboratory as requested by the clinical team. IMPRESSION: Successful ultrasound-guided therapeutic and diagnostic paracentesis yielding 4.4 liters of peritoneal fluid. Read by: Anders Grant, NP PLAN: The patient has required >/=  2 paracenteses in a 30 day period and a formal evaluation by the [facility] has been arranged. Electronically Signed   By: Marliss Coots M.D.   On: 06/08/2022 12:59     Assessment/Plan: ?ESBL ecoli bacteremia- on meropenem until 06/15/22 Gram neg seen on the rt knee  klebsiella and acinetobacter and pseudomonas= all sisceptible to meropenem    Recent strep pyogenes infection of rt prepatellar bursa, then had MRSA  infection and now gram neg rod   Encephalopathy from SUD- resolved   Decompensated cirrhosis with ascites Repeat Paracentesis done Cell count much improved Culture neg so far   Spontaneous bacteria peritonitis- on meropenem    Anemia Thrombocytopenia Hyperbilirubinemia Leucopenia All of the above due to decompensated cirrhosis ? Crystal meth use   Discussed the management with patient and care team

## 2022-06-09 NOTE — Plan of Care (Signed)
Pt AAOx4, no pain. Plan for IV abx. VS are WNL. Bed in lowest position, call light within reach. Will continue to monitor.

## 2022-06-09 NOTE — Progress Notes (Signed)
Initial Nutrition Assessment  DOCUMENTATION CODES:   Non-severe (moderate) malnutrition in context of chronic illness  INTERVENTION:   Ensure Enlive po TID, each supplement provides 350 kcal and 20 grams of protein.  MVI po daily   Pt at high refeed risk; recommend monitor potassium, magnesium and phosphorus labs daily until stable  Low sodium/high protein diet education  NUTRITION DIAGNOSIS:   Moderate Malnutrition related to chronic illness (cirrhosis, etoh abuse, substance abuse) as evidenced by severe fat depletion, moderate muscle depletion.  GOAL:   Patient will meet greater than or equal to 90% of their needs  MONITOR:   PO intake, Supplement acceptance, Labs, Weight trends, Skin, I & O's  REASON FOR ASSESSMENT:   Malnutrition Screening Tool    ASSESSMENT:   46 year old male with h/o homelessness, etoh and polysubstance abuse, liver cirrhosis and ascites who is admitted with altered mental status and was found to have erythema and drainage from a right knee incision s/p prepatellar bursitis requiring I & D x 2 (2/69, 4/85) complicated by bacteremia and septic arthritis.  Met with pt in room today. Pt reports good appetite and oral intake at baseline but reports that he is homeless and does not always have access to food. Pt reports good oral intake in hospital; pt eating 100% of meals. Pt reports that he drinks vanilla or strawberry supplements but can't afford these once he leaves the hospital. RD discussed with pt the importance of adequate protein needed to preserve lean muscle. Discussed with pt the importance of choosing high protein foods with meals. RD also discussed low sodium diet education with pt. RD will add supplements and MVI to help pt meet his estimated needs. Pt is at high refeed risk. Per chart, pt appears weight stable for two months pta; there is no recent documented weight history prior to June in chart.   Medications reviewed and include: ferrous  sulfate, folic acid, lasix, MVI, nicotine, miralax, senokot, aldactone, thiamine, meropenem    Labs reviewed: Na 134(L), K 3.9 wnl, creat 0.59(L) Wbc- 2.8(L), Hgb 8.9(L), Hct 27.2(L)  NUTRITION - FOCUSED PHYSICAL EXAM:  Flowsheet Row Most Recent Value  Orbital Region Mild depletion  Upper Arm Region Severe depletion  Thoracic and Lumbar Region Moderate depletion  Buccal Region No depletion  Temple Region Mild depletion  Clavicle Bone Region Moderate depletion  Clavicle and Acromion Bone Region Moderate depletion  Scapular Bone Region Moderate depletion  Dorsal Hand Mild depletion  Patellar Region Unable to assess  Anterior Thigh Region Unable to assess  Posterior Calf Region Unable to assess  Edema (RD Assessment) Moderate  Hair Reviewed  Eyes Reviewed  Mouth Reviewed  Skin Reviewed  Nails Reviewed   Diet Order:   Diet Order             Diet regular Room service appropriate? Yes; Fluid consistency: Thin; Fluid restriction: 2000 mL Fluid  Diet effective now                  EDUCATION NEEDS:   Education needs have been addressed  Skin:  Skin Assessment: Reviewed RN Assessment (incision R knee)  Last BM:  8/15- type 6  Height:   Ht Readings from Last 1 Encounters:  06/01/22 6' 3"  (1.905 m)    Weight:   Wt Readings from Last 1 Encounters:  06/07/22 108.9 kg    Ideal Body Weight:  89 kg  BMI:  Body mass index is 30 kg/m.  Estimated Nutritional Needs:   Kcal:  2700-3000kcal/day  Protein:  >135g/day  Fluid:  2.0L/day  Koleen Distance MS, RD, LDN Please refer to [facility] for RD and/or RD on-call/weekend/after hours pager

## 2022-06-10 DIAGNOSIS — L02419 Cutaneous abscess of limb, unspecified: Secondary | ICD-10-CM

## 2022-06-10 LAB — CULTURE, BLOOD (ROUTINE X 2)
Culture: NO GROWTH
Culture: NO GROWTH
Special Requests: ADEQUATE

## 2022-06-10 MED ORDER — FLEET ENEMA 7-19 GM/118ML RE ENEM
1.0000 | ENEMA | Freq: Every day | RECTAL | Status: DC | PRN
  Administered 2022-06-10: 1 via RECTAL

## 2022-06-10 NOTE — Progress Notes (Signed)
   Date of Admission:  06/01/2022     ID: Marco Spears is a 46 y.o. male  Principal Problem:   AMS (altered mental status) Active Problems:   Alcoholic cirrhosis of liver with ascites (HCC)   Hypokalemia   Anemia   Polysubstance abuse (HCC)   Septic arthritis (HCC)   Thrombocytopenia (HCC)   Portal hypertension (HCC)   Bacteremia    Subjective: Pt is feeling better Says he ambulated Still constipated   Medications:   feeding supplement  237 mL Oral TID BM   ferrous sulfate  325 mg Oral Q breakfast   folic acid  1 mg Oral Daily   furosemide  40 mg Oral BID   gabapentin  300 mg Oral TID   methocarbamol  750 mg Oral TID   multivitamin with minerals  1 tablet Oral Daily   nicotine  21 mg Transdermal Daily   nystatin  5 mL Oral QID   polyethylene glycol  17 g Oral Daily   senna-docusate  1 tablet Oral BID   sodium chloride flush  3 mL Intravenous Q12H   spironolactone  100 mg Oral Daily   thiamine (VITAMIN B1) injection  100 mg Intravenous Daily    Objective:   PHYSICAL EXAM:  Patient Vitals for the past 24 hrs:  BP Temp Temp src Pulse Resp SpO2  06/10/22 0325 114/70 98.2 F (36.8 C) Oral 84 16 100 %  06/09/22 1920 113/68 98.2 F (36.8 C) Oral 98 16 99 %    General:awake and alert Lungs: b/l air entry- Heart: s1s2 Abdomen: Soft,  bruising Extremities: rt knee wound vac has been removed- the sutures are coapted- some oozing because of edema legs Edema of the abdominal wall and beow lip edema as well Lymph: Cervical, supraclavicular normal. Neurologic: Grossly non-focal  Lab Results Recent Labs    06/09/22 1036  WBC 2.8*  HGB 8.9*  HCT 27.2*  NA 134*  K 3.9  CL 103  CO2 26  BUN 14  CREATININE 0.59*   Liver Panel Recent Labs    06/09/22 1036  PROT 5.1*  ALBUMIN 2.5*  AST 62*  ALT 55*  ALKPHOS 142*  BILITOT 2.2*    Microbiology: 06/01/22 BC- ESBL E.coli WC- Kleb oxytoca , acinetobacter Studies/Results: No results  found.   Assessment/Plan: ?ESBL ecoli bacteremia- on meropenem until 06/15/22 Gram neg seen on the rt knee  klebsiella and acinetobacter and pseudomonas= all susceptible to meropenem    Recent strep pyogenes infection of rt prepatellar bursa, then had MRSA infection and now gram neg rod   Encephalopathy from SUD- resolved   Decompensated cirrhosis with ascites Repeat Paracentesis done Cell count much improved Culture neg so far Anasarca secondary to the above   Spontaneous bacteria peritonitis- on meropenem    Anemia Thrombocytopenia Hyperbilirubinemia Leucopenia All of the above due to decompensated cirrhosis ? Crystal meth use   Discussed the management with patient and care team

## 2022-06-10 NOTE — Progress Notes (Signed)
PROGRESS NOTE    Marco Spears  QAS:[id number] DOB: December 26, 1975 DOA: 06/01/2022 PCP: Pcp, No    Brief Narrative:  46 y.o. male seen in ed brought by EMS for altered mental status where patient was picked up from a home where he had been on meth.  Patient's right knee incision had clear drainage per report patient was intermittently arousable but restless agitated and almost paranoid.  Patient reports pain over the right knee.  Patient gets startled with sound. Patient is altered and somnolent and not oriented and responds to when called but falls asleep immediately.   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  Patient had received Harvoni for his hep C and has had a DVT for which she has taken Eliquis.  Patient has past medical history of allergies to amoxicillin. Patient had diarrhea right knee recurrent septic prepatellar bursitis and has had I&D on 05/07/2022.  Patient had paracentesis on 05/22/2022 with 3.6 L of hazy yellow fluid.  Last paracentesis was on 05/26/2022 where he had 4.8 L removed. Patient is a large hematoma about 12 cm on his lower abdominal area more towards suprapubic area.   Infectious disease following.  GI signed off as of 8/9.  Home diuretics resumed. Mental status improved on 8/10.  Continues to endorse severe muscle spasms Status post ultrasound-guided paracentesis 8/11.  6 L fluid liberated Status post repeat ultrasound-guided paracentesis 8/14.  4.4 L fluid removed  8/16 had small bm yesterday, wants enema  Assessment & Plan:   Principal Problem:   AMS (altered mental status) Active Problems:   Alcoholic cirrhosis of liver with ascites (HCC)   Anemia   Septic arthritis (HCC)   Hypokalemia   Polysubstance abuse (HCC)   Thrombocytopenia (HCC)   Portal hypertension (HCC)   Bacteremia  Acute metabolic encephalopathy Likely multifactorial.  Patient is a admitted  methamphetamine user.  Also has infection which could be contributing.  Patient remains somewhat lethargic with tangential thought and mumbling.  CT head normal. 8/10: Mental status improved.  Appears to be back to baseline Plan: 8/16 MS at baseline.  Avoid sedatives   Septic arthritis right knee ESBL E. coli bacteremia Blood culture positive from 8/8.  Infectious disease on board.  Was previously getting vancomycin and Rocephin.  Changed to meropenem.  Orthopedics consulted.  Incisional wound VAC in place by [facility] team.  Application of knee immobilizer. Plan: 8/16 continue meropenem until 8/21 VAC d/c'd on 8/15, cleared for dc from orthopedics stand point. F/u ortho in 7-10 days.  Anemia Thrombocytopenia Portal hypertension Spontaneous bacterial peritonitis Hepatic cirrhosis No evidence of bleeding Currently no indication for PRBC or platelet transfusion Will monitor closely for bleed Continue meropenem as above as this will cover for SBP as well Continue home Lasix and Aldactone 8/11: Repeat ultrasound-guided paracentesis ordered 8/11: Ultrasound-guided paracentesis.  6 L fluid removed 8/14: Ultrasound-guided paracentesis.  4.4 L fluid removed 8/16h/h stable. Monitor. No abd pain  Polysubstance use Counseled patient 8/16 avoid narcotics     DVT prophylaxis: SCD Code Status: Full Family Communication: None Disposition Plan: Status is: Inpatient Remains inpatient appropriate because: Right knee septic arthritis on IV antibiotics until 8/21, due to hx/o drug abuse  Level of care: Med-Surg  Consultants:  ID, ortho  Procedures:  Paracentesis Knee arthrocentesis  Antimicrobials: Meropenem   Subjective: C/o constipation, although had bm yesterday. No abd pain, n/v  Objective: Vitals:   06/08/22 1933  06/09/22 0336 06/09/22 1920 06/10/22 0325  BP: 111/68 104/64 113/68 114/70  Pulse: 91 89 98 84  Resp: Temp: 98.4 F (36.9 C) 98.5 F (36.9 C) 98.2  F (36.8 C) 98.2 F (36.8 C)  TempSrc: Oral Oral Oral Oral  SpO2: 99% 96% 99% 100%  Weight:      Height:        Intake/Output Summary (Last 24 hours) at 06/10/2022 0824 Last data filed at 06/10/2022 0600 Gross per 24 hour  Intake 600 ml  Output --  Net 600 ml   Filed Weights   06/05/22 0500 06/06/22 0417 06/07/22 0500  Weight: 107.2 kg 110.7 kg 108.9 kg    Examination: Calm, NAD Cta no w/r Reg s1/s2 no gallop Soft , mild distention, no pain No edema Awake and alert, grossly intact Mood and affect appropriate in current setting     Data Reviewed: I have personally reviewed following labs and imaging studies  CBC: Recent Labs  Lab 06/04/22 0817 06/05/22 0755 06/07/22 0740 06/09/22 1036  WBC 3.3* 2.9* 3.8* 2.8*  NEUTROABS 2.1 1.8 2.5 1.7  HGB 7.9* 7.9* 8.5* 8.9*  HCT 22.6* 23.1* 25.2* 27.2*  MCV 97.8 98.7 100.0 100.7*  PLT 33* 45* 63* 75*   Basic Metabolic Panel: Recent Labs  Lab 06/04/22 0425 06/04/22 0817 06/05/22 0755 06/07/22 0740 06/09/22 1036  NA  --  131* 134* 136 134*  K  --  2.8* 3.9 4.1 3.9  CL  --  103 108 106 103  CO2  --  GLUCOSE  --  152* 123* 115* 140*  BUN  --  CREATININE 0.75 0.63 0.59* 0.62 0.59*  CALCIUM  --  7.6* 7.6* 8.0* 8.0*   GFR: Estimated Creatinine Clearance: 150.6 mL/min (A) (by C-G formula based on SCr of 0.59 mg/dL (L)). Liver Function Tests: Recent Labs  Lab 06/04/22 0817 06/09/22 1036  AST 49* 62*  ALT 30 55*  ALKPHOS 67 142*  BILITOT 2.2* 2.2*  PROT 4.9* 5.1*  ALBUMIN 2.6* 2.5*   No results for input(s): "LIPASE", "AMYLASE" in the last 168 hours. No results for input(s): "AMMONIA" in the last 168 hours.  Coagulation Profile: No results for input(s): "INR", "PROTIME" in the last 168 hours.  Cardiac Enzymes: No results for input(s): "CKTOTAL", "CKMB", "CKMBINDEX", "TROPONINI" in the last 168 hours.  BNP (last 3 results) No results for input(s): "PROBNP" in the last 8760  hours. HbA1C: No results for input(s): "HGBA1C" in the last 72 hours. CBG: Recent Labs  Lab 06/03/22 1933 06/04/22 0749  GLUCAP 118* 140*   Lipid Profile: No results for input(s): "CHOL", "HDL", "LDLCALC", "TRIG", "CHOLHDL", "LDLDIRECT" in the last 72 hours. Thyroid Function Tests: No results for input(s): "TSH", "T4TOTAL", "FREET4", "T3FREE", "THYROIDAB" in the last 72 hours.  Anemia Panel: No results for input(s): "VITAMINB12", "FOLATE", "FERRITIN", "TIBC", "IRON", "RETICCTPCT" in the last 72 hours. Sepsis Labs: No results for input(s): "PROCALCITON", "LATICACIDVEN" in the last 168 hours.   Recent Results (from the past 240 hour(s))  Resp Panel by RT-PCR (Flu A&B, Covid) Anterior Nasal Swab     Status: None   Collection Time: 06/01/22 12:34 PM   Specimen: Anterior Nasal Swab  Result Value Ref Range Status   SARS Coronavirus 2 by RT PCR NEGATIVE NEGATIVE Final    Comment: (NOTE) SARS-CoV-2 target nucleic acids are NOT DETECTED.  The SARS-CoV-2 RNA is generally detectable in upper respiratory specimens during  the acute phase of infection. The lowest concentration of SARS-CoV-2 viral copies this assay can detect is 138 copies/mL. A negative result does not preclude SARS-Cov-2 infection and should not be used as the sole basis for treatment or other patient management decisions. A negative result may occur with  improper specimen collection/handling, submission of specimen other than nasopharyngeal swab, presence of viral mutation(s) within the areas targeted by this assay, and inadequate number of viral copies(<138 copies/mL). A negative result must be combined with clinical observations, patient history, and epidemiological information. The expected result is Negative.  Fact Sheet for Patients:  BloggerCourse.comhttps://www.fda.gov/media/152166/download  Fact Sheet for Healthcare Providers:  SeriousBroker.ithttps://www.fda.gov/media/152162/download  This test is no t yet approved or cleared by the  [place] FDA and  has been authorized for detection and/or diagnosis of SARS-CoV-2 by FDA under an Emergency Use Authorization (EUA). This EUA will remain  in effect (meaning this test can be used) for the duration of the COVID-19 declaration under Section 564(b)(1) of the Act, 21 U.S.C.section 360bbb-3(b)(1), unless the authorization is terminated  or revoked sooner.       Influenza A by PCR NEGATIVE NEGATIVE Final   Influenza B by PCR NEGATIVE NEGATIVE Final    Comment: (NOTE) The Xpert Xpress SARS-CoV-2/FLU/RSV plus assay is intended as an aid in the diagnosis of influenza from Nasopharyngeal swab specimens and should not be used as a sole basis for treatment. Nasal washings and aspirates are unacceptable for Xpert Xpress SARS-CoV-2/FLU/RSV testing.  Fact Sheet for Patients: BloggerCourse.comhttps://www.fda.gov/media/152166/download  Fact Sheet for Healthcare Providers: SeriousBroker.ithttps://www.fda.gov/media/152162/download  This test is not yet approved or cleared by the [place] FDA and has been authorized for detection and/or diagnosis of SARS-CoV-2 by FDA under an Emergency Use Authorization (EUA). This EUA will remain in effect (meaning this test can be used) for the duration of the COVID-19 declaration under Section 564(b)(1) of the Act, 21 U.S.C. section 360bbb-3(b)(1), unless the authorization is terminated or revoked.  Performed at [facility], [address] [place], [place] [address]   Blood Culture (routine x 2)     Status: Abnormal   Collection Time: 06/01/22 12:34 PM   Specimen: BLOOD  Result Value Ref Range Status   Specimen Description   Final    BLOOD Blood Culture adequate volume Performed at [facility], [address] [place], [place] [address]    Special Requests   Final    BOTTLES DRAWN AEROBIC AND ANAEROBIC LEFT ANTECUBITAL Performed at [facility], [address] [place], [place] [address]    Culture  Setup Time   Final     GRAM NEGATIVE RODS ANAEROBIC BOTTLE ONLY CRITICAL RESULT CALLED TO, READ BACK BY AND VERIFIED WITH: NATHAN BELUE AT 0017 ON 06/02/22 BY SS    Culture (A)  Final    ESCHERICHIA COLI Confirmed Extended Spectrum Beta-Lactamase Producer (ESBL).  In bloodstream infections from ESBL organisms, carbapenems are preferred over piperacillin/tazobactam. They are shown to have a lower risk of mortality.    Report Status 06/04/2022 FINAL  Final   Organism ID, Bacteria ESCHERICHIA COLI  Final      Susceptibility   Escherichia coli - MIC*    AMPICILLIN >=32 RESISTANT Resistant     CEFAZOLIN >=64 RESISTANT Resistant     CEFEPIME 16 RESISTANT Resistant     CEFTAZIDIME RESISTANT Resistant     CEFTRIAXONE >=64 RESISTANT Resistant     CIPROFLOXACIN >=4 RESISTANT Resistant     GENTAMICIN <=1 SENSITIVE Sensitive  IMIPENEM <=0.25 SENSITIVE Sensitive     TRIMETH/SULFA >=320 RESISTANT Resistant     AMPICILLIN/SULBACTAM 16 INTERMEDIATE Intermediate     PIP/TAZO <=4 SENSITIVE Sensitive     * ESCHERICHIA COLI  Blood Culture (routine x 2)     Status: None   Collection Time: 06/01/22 12:34 PM   Specimen: BLOOD  Result Value Ref Range Status   Specimen Description   Final    BLOOD Blood Culture results may not be optimal due to an inadequate volume of blood received in culture bottles   Special Requests BOTTLES DRAWN AEROBIC AND ANAEROBIC WRIST  Final   Culture   Final    NO GROWTH 5 DAYS Performed at [facility], [address] [place], [place] [address]    Report Status 06/06/2022 FINAL  Final  Blood Culture ID Panel (Reflexed)     Status: Abnormal   Collection Time: 06/01/22 12:34 PM  Result Value Ref Range Status   Enterococcus faecalis NOT DETECTED NOT DETECTED Final   Enterococcus Faecium NOT DETECTED NOT DETECTED Final   Listeria monocytogenes NOT DETECTED NOT DETECTED Final   Staphylococcus species NOT DETECTED NOT DETECTED Final   Staphylococcus aureus (BCID) NOT DETECTED NOT  DETECTED Final   Staphylococcus epidermidis NOT DETECTED NOT DETECTED Final   Staphylococcus lugdunensis NOT DETECTED NOT DETECTED Final   Streptococcus species NOT DETECTED NOT DETECTED Final   Streptococcus agalactiae NOT DETECTED NOT DETECTED Final   Streptococcus pneumoniae NOT DETECTED NOT DETECTED Final   Streptococcus pyogenes NOT DETECTED NOT DETECTED Final   A.calcoaceticus-baumannii NOT DETECTED NOT DETECTED Final   Bacteroides fragilis NOT DETECTED NOT DETECTED Final   Enterobacterales DETECTED (A) NOT DETECTED Final    Comment: Enterobacterales represent a large order of gram negative bacteria, not a single organism. CRITICAL RESULT CALLED TO, READ BACK BY AND VERIFIED WITH: NATHAN BELUE AT 0017 ON 06/02/22 BY SS    Enterobacter cloacae complex NOT DETECTED NOT DETECTED Final   Escherichia coli DETECTED (A) NOT DETECTED Final    Comment: CRITICAL RESULT CALLED TO, READ BACK BY AND VERIFIED WITH: NATHAN BELUE AT 0017 ON 06/02/22 BY SS    Klebsiella aerogenes NOT DETECTED NOT DETECTED Final   Klebsiella oxytoca NOT DETECTED NOT DETECTED Final   Klebsiella pneumoniae NOT DETECTED NOT DETECTED Final   Proteus species NOT DETECTED NOT DETECTED Final   Salmonella species NOT DETECTED NOT DETECTED Final   Serratia marcescens NOT DETECTED NOT DETECTED Final   Haemophilus influenzae NOT DETECTED NOT DETECTED Final   Neisseria meningitidis NOT DETECTED NOT DETECTED Final   Pseudomonas aeruginosa NOT DETECTED NOT DETECTED Final   Stenotrophomonas maltophilia NOT DETECTED NOT DETECTED Final   Candida albicans NOT DETECTED NOT DETECTED Final   Candida auris NOT DETECTED NOT DETECTED Final   Candida glabrata NOT DETECTED NOT DETECTED Final   Candida krusei NOT DETECTED NOT DETECTED Final   Candida parapsilosis NOT DETECTED NOT DETECTED Final   Candida tropicalis NOT DETECTED NOT DETECTED Final   Cryptococcus neoformans/gattii NOT DETECTED NOT DETECTED Final   CTX-M ESBL DETECTED (A) NOT  DETECTED Final    Comment: CRITICAL RESULT CALLED TO, READ BACK BY AND VERIFIED WITH: NATHAN BELUE AT 0017 ON 06/02/22 BY SS (NOTE) Extended spectrum beta-lactamase detected. Recommend a carbapenem as initial therapy.      Carbapenem resistance IMP NOT DETECTED NOT DETECTED Final   Carbapenem resistance KPC NOT DETECTED NOT DETECTED Final   Carbapenem resistance NDM NOT DETECTED NOT DETECTED Final   Carbapenem  resist OXA 48 LIKE NOT DETECTED NOT DETECTED Final   Carbapenem resistance VIM NOT DETECTED NOT DETECTED Final    Comment: Performed at [facility], [address], [place], [place] [address]  Urine Culture     Status: None   Collection Time: 06/01/22  1:06 PM   Specimen: In/Out Cath Urine  Result Value Ref Range Status   Specimen Description   Final    IN/OUT CATH URINE Performed at [facility], [address] [place], [place] [address]    Special Requests   Final    NONE Performed at [facility], [address], [place], [place] [address]    Culture   Final    NO GROWTH Performed at [facility], [address], [place], [place] [address]    Report Status 06/02/2022 FINAL  Final  Aerobic Culture w Gram Stain (superficial specimen)     Status: None   Collection Time: 06/01/22  4:35 PM   Specimen: Wound  Result Value Ref Range Status   Specimen Description   Final    WOUND Performed at [facility], [address] [place], [place] [address]    Special Requests   Final    RT KNEE Performed at [facility], [address] [place], [place] [address]    Gram Stain   Final    RARE WBC PRESENT, PREDOMINANTLY PMN RARE GRAM NEGATIVE RODS    Culture   Final    FEW KLEBSIELLA OXYTOCA FEW ACINETOBACTER CALCOACETICUS/BAUMANNII COMPLEX RARE PSEUDOMONAS AERUGINOSA Confirmed Extended Spectrum Beta-Lactamase Producer (ESBL).  In bloodstream infections from ESBL organisms, carbapenems are preferred over  piperacillin/tazobactam. They are shown to have a lower risk of mortality. FOR KLEBSIELLA Performed at [facility], [address], [place], [place] [address]    Report Status 06/07/2022 FINAL  Final   Organism ID, Bacteria ACINETOBACTER CALCOACETICUS/BAUMANNII COMPLEX  Final   Organism ID, Bacteria KLEBSIELLA OXYTOCA  Final   Organism ID, Bacteria PSEUDOMONAS AERUGINOSA  Final      Susceptibility   Acinetobacter calcoaceticus/baumannii complex - MIC*    CEFTAZIDIME 8 SENSITIVE Sensitive     CIPROFLOXACIN 1 SENSITIVE Sensitive     GENTAMICIN <=1 SENSITIVE Sensitive     IMIPENEM <=0.25 SENSITIVE Sensitive     PIP/TAZO 8 SENSITIVE Sensitive     TRIMETH/SULFA >=320 RESISTANT Resistant     AMPICILLIN/SULBACTAM <=2 SENSITIVE Sensitive     * FEW ACINETOBACTER CALCOACETICUS/BAUMANNII COMPLEX   Klebsiella oxytoca - MIC*    AMPICILLIN >=32 RESISTANT Resistant     CEFAZOLIN >=64 RESISTANT Resistant     CEFEPIME >=32 RESISTANT Resistant     CEFTAZIDIME RESISTANT Resistant     CEFTRIAXONE >=64 RESISTANT Resistant     CIPROFLOXACIN 1 RESISTANT Resistant     GENTAMICIN >=16 RESISTANT Resistant     IMIPENEM <=0.25 SENSITIVE Sensitive     TRIMETH/SULFA >=320 RESISTANT Resistant     AMPICILLIN/SULBACTAM >=32 RESISTANT Resistant     PIP/TAZO 16 SENSITIVE Sensitive     * FEW KLEBSIELLA OXYTOCA   Pseudomonas aeruginosa - MIC*    CEFTAZIDIME 4 SENSITIVE Sensitive     CIPROFLOXACIN <=0.25 SENSITIVE Sensitive     GENTAMICIN <=1 SENSITIVE Sensitive     IMIPENEM 2 SENSITIVE Sensitive     PIP/TAZO 8 SENSITIVE Sensitive     CEFEPIME 2 SENSITIVE Sensitive     * RARE PSEUDOMONAS AERUGINOSA  MRSA Next Gen by PCR, Nasal     Status: None  Collection Time: 06/01/22  9:00 PM   Specimen: Nasal Mucosa; Nasal Swab  Result Value Ref Range Status   MRSA by PCR Next Gen NOT DETECTED NOT DETECTED Final    Comment: (NOTE) The GeneXpert MRSA Assay (FDA approved for NASAL specimens only), is one component  of a comprehensive MRSA colonization surveillance program. It is not intended to diagnose MRSA infection nor to guide or monitor treatment for MRSA infections. Test performance is not FDA approved in patients less than 56 years old. Performed at [facility], [address] [place], [place] [address]   Aerobic/Anaerobic Culture w Gram Stain (surgical/deep wound)     Status: None   Collection Time: 06/02/22  2:37 PM   Specimen: PATH Cytology Peritoneal fluid  Result Value Ref Range Status   Specimen Description   Final    PERITONEAL Performed at [facility], [address], [place], [place] [address]    Special Requests   Final    NONE Performed at [facility], [address] [place], [place] [address]    Gram Stain   Final    FEW WBC PRESENT,BOTH PMN AND MONONUCLEAR NO ORGANISMS SEEN    Culture   Final    No growth aerobically or anaerobically. Performed at [facility], [address], [place], [place] [address]    Report Status 06/07/2022 FINAL  Final  Acid Fast Smear (AFB)     Status: None   Collection Time: 06/02/22  2:37 PM   Specimen: PATH Cytology Peritoneal fluid  Result Value Ref Range Status   AFB Specimen Processing Concentration  Final   Acid Fast Smear Negative  Final    Comment: (NOTE) Performed At: [facility] [address] [place], [place] [id number] Jolene Schimke MD MO:[id number]    Source (AFB) PERI  Final    Comment: Performed at [facility], [address] [place], [place] [address]  Culture, blood (Routine X 2) w Reflex to ID Panel     Status: None   Collection Time: 06/05/22  4:14 AM   Specimen: BLOOD  Result Value Ref Range Status   Specimen Description BLOOD BLOOD RIGHT HAND  Final   Special Requests   Final    BOTTLES DRAWN AEROBIC AND ANAEROBIC Blood Culture adequate volume   Culture   Final    NO GROWTH 5 DAYS Performed at [facility], [address]  [place], [place] [address]    Report Status 06/10/2022 FINAL  Final  Culture, blood (Routine X 2) w Reflex to ID Panel     Status: None   Collection Time: 06/05/22  4:14 AM   Specimen: BLOOD  Result Value Ref Range Status   Specimen Description BLOOD BLOOD LEFT HAND  Final   Special Requests   Final    IN PEDIATRIC BOTTLE Blood Culture results may not be optimal due to an excessive volume of blood received in culture bottles   Culture   Final    NO GROWTH 5 DAYS Performed at [facility], [address] [place], [place] [address]    Report Status 06/10/2022 FINAL  Final  Body fluid culture w Gram Stain     Status: None (Preliminary result)   Collection Time: 06/08/22 11:39 AM   Specimen: PATH Cytology Peritoneal fluid  Result Value Ref Range Status   Specimen Description   Final    PERITONEAL Performed at [facility], [address] [place], [place] [address]  Special Requests   Final    NONE Performed at [facility], [address] [place], [place] [address]    Gram Stain   Final    WBC PRESENT,BOTH PMN AND MONONUCLEAR NO ORGANISMS SEEN CYTOSPIN SMEAR    Culture   Final    NO GROWTH < 24 HOURS Performed at [facility], [address], [place], [place] [address]    Report Status PENDING  Incomplete         Radiology Studies: US Paracentesis  Result Date: 06/08/2022 INDICATION: Patient with history of alcoholic cirrhosis with recurrent ascites. Request is for therapeutic and diagnostic paracentesis EXAM: ULTRASOUND GUIDED THERAPEUTIC AND DIAGNOSTIC PARACENTESIS MEDICATIONS: Lidocaine 1% 10 mL COMPLICATIONS: None immediate. PROCEDURE: Informed written consent was obtained from the patient after a discussion of the risks, benefits and alternatives to treatment. A timeout was performed prior to the initiation of the procedure. Initial ultrasound scanning demonstrates a moderate amount of ascites within the right lower abdominal quadrant.  The right lower abdomen was prepped and draped in the usual sterile fashion. 1% lidocaine was used for local anesthesia. Following this, a 19 gauge, 7-cm, Yueh catheter was introduced. An ultrasound image was saved for documentation purposes. The paracentesis was performed. The catheter was removed and a dressing was applied. The patient tolerated the procedure well without immediate post procedural complication. FINDINGS: A total of approximately 4.4 L of straw-colored fluid was removed. Samples were sent to the laboratory as requested by the clinical team. IMPRESSION: Successful ultrasound-guided therapeutic and diagnostic paracentesis yielding 4.4 liters of peritoneal fluid. Read by: Anders Grant, NP PLAN: The patient has required >/=2 paracenteses in a 30 day period and a formal evaluation by the [facility] has been arranged. Electronically Signed   By: Marliss Coots M.D.   On: 06/08/2022 12:59        Scheduled Meds:  feeding supplement  237 mL Oral TID BM   ferrous sulfate  325 mg Oral Q breakfast   folic acid  1 mg Oral Daily   furosemide  40 mg Oral BID   gabapentin  300 mg Oral TID   methocarbamol  750 mg Oral TID   multivitamin with minerals  1 tablet Oral Daily   nicotine  21 mg Transdermal Daily   nystatin  5 mL Oral QID   polyethylene glycol  17 g Oral Daily   senna-docusate  1 tablet Oral BID   sodium chloride flush  3 mL Intravenous Q12H   spironolactone  100 mg Oral Daily   thiamine (VITAMIN B1) injection  100 mg Intravenous Daily   Continuous Infusions:  sodium chloride Stopped (06/06/22 0521)   albumin human     meropenem (MERREM) IV 1 g (06/10/22 0107)     LOS: 9 days   Time spent:  Lynn Ito, MD [facility]   If 7PM-7AM, please contact night-coverage  06/10/2022, 8:24 AM

## 2022-06-10 NOTE — TOC Progression Note (Signed)
Transition of Care [facility]) - Progression Note    Patient Details  Name: Marco Spears MRN: [id number] Date of Birth: Aug 22, 1976  Transition of Care [facility]) CM/SW Contact  Chapman Fitch, RN Phone Number: 06/10/2022, 3:02 PM  Clinical Narrative:     Message sent to Jewel Baize Copland with Financial Counseling to determine if patient would be eligible to apply for Medicaid   Expected Discharge Plan: Homeless Shelter Barriers to Discharge: Continued Medical Work up  Expected Discharge Plan and Services Expected Discharge Plan: Homeless Shelter                                               Social Determinants of Health (SDOH) Interventions    Readmission Risk Interventions    05/06/2022    2:26 PM  Readmission Risk Prevention Plan  Transportation Screening Complete  Medication Review Oceanographer) Complete

## 2022-06-10 NOTE — TOC Progression Note (Signed)
Transition of Care [facility]) - Progression Note    Patient Details  Name: Marco Spears MRN: [id number] Date of Birth: 1976/01/20  Transition of Care [facility]) CM/SW Contact  Chapman Fitch, RN Phone Number: 06/10/2022, 4:25 PM  Clinical Narrative:    Per [facility] Patient Financial Advocate Patient does not meet a category for medicaid at this time    Expected Discharge Plan: Homeless Shelter Barriers to Discharge: Continued Medical Work up  Expected Discharge Plan and Services Expected Discharge Plan: Homeless Shelter                                               Social Determinants of Health (SDOH) Interventions    Readmission Risk Interventions    05/06/2022    2:26 PM  Readmission Risk Prevention Plan  Transportation Screening Complete  Medication Review Oceanographer) Complete

## 2022-06-10 NOTE — Plan of Care (Signed)
Pt AAOx4, no pain. VS are stable. Plan for IV abx until 8/21 and mobility. Currently up to chair. Call light within reach. Will continue to monitor.

## 2022-06-10 NOTE — Progress Notes (Signed)
  Subjective:  S/P pre-patellar septic bursitis I&D 7/13 and 7/27.  Patient walking around the hall without any right knee pain.  VAC dressing was removed by me yesterday.  Objective:   VITALS:   Vitals:   06/08/22 1933 06/09/22 0336 06/09/22 1920 06/10/22 0325  BP: 111/68 104/64 113/68 114/70  Pulse: 91 89 98 84  Resp: 16 20 16 16   Temp: 98.4 F (36.9 C) 98.5 F (36.9 C) 98.2 F (36.8 C) 98.2 F (36.8 C)  TempSrc: Oral Oral Oral Oral  SpO2: 99% 96% 99% 100%  Weight:      Height:        PHYSICAL EXAM: Right knee: No effusion or erythema.  Painless knee range of motion.  Patient however has serous drainage on his gauze dressing placed by me yesterday.  Distally is neurovascular intact.  There is no tenderness to palpation of the right knee.   LABS  No results found for this or any previous visit (from the past 24 hour(s)).  No results found.  Assessment/Plan:     Principal Problem:   AMS (altered mental status) Active Problems:   Alcoholic cirrhosis of liver with ascites (HCC)   Hypokalemia   Anemia   Polysubstance abuse (HCC)   Septic arthritis (HCC)   Thrombocytopenia (HCC)   Portal hypertension (HCC)   Bacteremia  Patient continues to have serous drainage from his anterior knee incision.  Patient's liver disease, fluid retention/ascites and malnutrition are likely contributing to his delayed incisional wound healing and persistent drainage.  Continue antibiotics per infectious disease.  I personally change the patient's dressing today and will continue to monitor serous drainage output.  Patient may need to have his VAC dressing replaced or a Praveena dressing may need to be used.  However given the patient's homelessness, I am concerned that he will be able to take care of a Prevena dressing after discharge.  Patient has rebounded to the hospital already 3 times and has been noncompliant with his antibiotics.  I have serious concerns that the patient will be able  to heal his wound after discharge.    , MD 06/10/2022, 2:00 PM

## 2022-06-11 ENCOUNTER — Inpatient Hospital Stay: Payer: Self-pay

## 2022-06-11 DIAGNOSIS — E440 Moderate protein-calorie malnutrition: Secondary | ICD-10-CM

## 2022-06-11 LAB — BODY FLUID CULTURE W GRAM STAIN: Culture: NO GROWTH

## 2022-06-11 NOTE — Progress Notes (Signed)
Date of Admission:  06/01/2022     ID: Marco Spears is a 46 y.o. male  Principal Problem:   AMS (altered mental status) Active Problems:   Alcoholic cirrhosis of liver with ascites (HCC)   Hypokalemia   Anemia   Polysubstance abuse (HCC)   Septic arthritis (HCC)   Thrombocytopenia (HCC)   Portal hypertension (HCC)   Bacteremia   Malnutrition of moderate degree    Subjective: Had 4 liters of ascitic fluid removed today After which he had a good bowel movt   Medications:   feeding supplement  237 mL Oral TID BM   ferrous sulfate  325 mg Oral Q breakfast   folic acid  1 mg Oral Daily   furosemide  40 mg Oral BID   gabapentin  300 mg Oral TID   methocarbamol  750 mg Oral TID   multivitamin with minerals  1 tablet Oral Daily   nicotine  21 mg Transdermal Daily   nystatin  5 mL Oral QID   polyethylene glycol  17 g Oral Daily   senna-docusate  1 tablet Oral BID   sodium chloride flush  3 mL Intravenous Q12H   spironolactone  100 mg Oral Daily   thiamine (VITAMIN B1) injection  100 mg Intravenous Daily    Objective:   PHYSICAL EXAM:  Patient Vitals for the past 24 hrs:  BP Temp Temp src Pulse Resp SpO2  06/11/22 1158 (!) 107/58 -- -- 76 -- 98 %  06/11/22 1115 109/65 -- -- 78 -- 98 %  06/11/22 0554 111/62 98.2 F (36.8 C) Oral 93 18 96 %  06/10/22 2001 111/62 98.2 F (36.8 C) Oral 88 17 99 %    General:awake and alert Lungs: b/l air entry- Heart: s1s2 Abdomen: Soft,  less distended - rt side paracentesis dressing Extremities: rt knee wound vac has been removed- the sutures are coapted- some oozing because of edema legs Edema of the abdominal wall and lower leg  improving Lymph: Cervical, supraclavicular normal. Neurologic: Grossly non-focal  Lab Results Recent Labs    06/09/22 1036  WBC 2.8*  HGB 8.9*  HCT 27.2*  NA 134*  K 3.9  CL 103  CO2 26  BUN 14  CREATININE 0.59*   Liver Panel Recent Labs    06/09/22 1036  PROT 5.1*  ALBUMIN 2.5*  AST  62*  ALT 55*  ALKPHOS 142*  BILITOT 2.2*    Microbiology: 06/01/22 BC- ESBL E.coli WC- Kleb oxytoca , acinetobacter Studies/Results: US Paracentesis  Result Date: 06/11/2022 INDICATION: Patient with history of cirrhosis and recurrent ascites request received for therapeutic paracentesis. EXAM: ULTRASOUND GUIDED  PARACENTESIS MEDICATIONS: Local 1% lidocaine only. COMPLICATIONS: None immediate. PROCEDURE: Informed written consent was obtained from the patient after a discussion of the risks, benefits and alternatives to treatment. A timeout was performed prior to the initiation of the procedure. Initial ultrasound scanning demonstrates a large amount of ascites within the right upper abdominal quadrant. The right upper abdomen was prepped and draped in the usual sterile fashion. 1% lidocaine was used for local anesthesia. Following this, a 19 gauge, 7-cm, Yueh catheter was introduced. An ultrasound image was saved for documentation purposes. The paracentesis was performed. The catheter was removed and a dressing was applied. The patient tolerated the procedure well without immediate post procedural complication. FINDINGS: A total of approximately 4.5 L of clear yellow fluid was removed. IMPRESSION: Successful ultrasound-guided paracentesis yielding 4.5 liters of peritoneal fluid. PLAN: The patient has required >/=2  paracenteses in a 30 day period and a formal evaluation by the [facility]pert[facility] has been arranged. This exam was performed by Pattricia Boss PA-C, and was supervised and interpreted by Dr. Juliette Alcide. Electronically Signed   By: Olive Bass M.D.   On: 06/11/2022 13:39     Assessment/Plan: ?ESBL ecoli bacteremia- on meropenem until 06/15/22 Gram neg seen on the rt knee  klebsiella and acinetobacter and pseudomonas= all susceptible to meropenem    Recent strep pyogenes infection of rt prepatellar bursa, then had MRSA infection and now gram neg rod    Encephalopathy from SUD- resolved   Decompensated cirrhosis with ascites Repeat Paracentesis done Cell count much improved Culture neg so far Anasarca secondary to the above   Spontaneous bacteria peritonitis- on meropenem    Anemia Thrombocytopenia Hyperbilirubinemia Leucopenia All of the above due to decompensated cirrhosis ? Crystal meth use   Discussed the management with patient and care team High risk of readmission due to homelessness and SUD

## 2022-06-11 NOTE — Progress Notes (Signed)
  Subjective:  S/P pre-patellar septic bursitis I&D 7/13 and 7/27.  Patient sitting by his window in his room eating dinner.  He denies any significant left knee pain.  Patient states his dressing was just recently changed by the nursing staff.    Objective:   VITALS:   Vitals:   06/10/22 2001 06/11/22 0554 06/11/22 1115 06/11/22 1158  BP: 111/62 111/62 109/65 (!) 107/58  Pulse: 88 93 78 76  Resp: 17 18    Temp: 98.2 F (36.8 C) 98.2 F (36.8 C)    TempSrc: Oral Oral    SpO2: 99% 96% 98% 98%  Weight:      Height:        PHYSICAL EXAM: Left knee:  Gauze dressing is clean and dry currently.  There is no erythema ecchymosis or knee effusion.  He has painless knee range of motion.  He has palpable pedal pulses distally and intact motor function.  Continues to mention paresthesias in both feet.  LABS  No results found for this or any previous visit (from the past 24 hour(s)).  US Paracentesis  Result Date: 06/11/2022 INDICATION: Patient with history of cirrhosis and recurrent ascites request received for therapeutic paracentesis. EXAM: ULTRASOUND GUIDED  PARACENTESIS MEDICATIONS: Local 1% lidocaine only. COMPLICATIONS: None immediate. PROCEDURE: Informed written consent was obtained from the patient after a discussion of the risks, benefits and alternatives to treatment. A timeout was performed prior to the initiation of the procedure. Initial ultrasound scanning demonstrates a large amount of ascites within the right upper abdominal quadrant. The right upper abdomen was prepped and draped in the usual sterile fashion. 1% lidocaine was used for local anesthesia. Following this, a 19 gauge, 7-cm, Yueh catheter was introduced. An ultrasound image was saved for documentation purposes. The paracentesis was performed. The catheter was removed and a dressing was applied. The patient tolerated the procedure well without immediate post procedural complication. FINDINGS: A total of approximately  4.5 L of clear yellow fluid was removed. IMPRESSION: Successful ultrasound-guided paracentesis yielding 4.5 liters of peritoneal fluid. PLAN: The patient has required >/=2 paracenteses in a 30 day period and a formal evaluation by the [facility] Inter[facility] Radiology [facility] Hypertension [facility] has been arranged. This exam was performed by Pattricia Boss PA-C, and was supervised and interpreted by Dr. Juliette Alcide. Electronically Signed   By: Olive Bass M.D.   On: 06/11/2022 13:39    Assessment/Plan:     Principal Problem:   AMS (altered mental status) Active Problems:   Alcoholic cirrhosis of liver with ascites (HCC)   Hypokalemia   Anemia   Polysubstance abuse (HCC)   Septic arthritis (HCC)   Thrombocytopenia (HCC)   Portal hypertension (HCC)   Bacteremia   Malnutrition of moderate degree  Patient is a difficult case.  He is homeless and has been noncompliant with his oral antibiotics after previous discharges for his right knee infection.  Patient would not be capable of maintaining a clean surgical site or changing dressings after discharge, in my opinion.  Patient is a history of drug use and has been using methamphetamine in between his hospital admissions.  Patient at high risk for readmission.  Continue dressing changes as needed.  Patient may need a Prevena or VAC dressing if he continues to have serous drainage from his surgical site.    Juanell Fairly , MD 06/11/2022, 6:13 PM

## 2022-06-11 NOTE — Procedures (Signed)
PROCEDURE SUMMARY:  Successful ultrasound guided paracentesis from the RUQ Yielded 4.5 L of clear yellow fluid.  No immediate complications.  The patient tolerated the procedure well.   Specimen was not sent for labs.  EBL <  4mL  The patient has required >/=2 paracenteses in a 30 day period and a screening evaluation by the [facility] Interventional Radiology Portal Hypertension [facility] has been arranged.  Berneta Levins, PA-C 06/11/2022, 12:20 PM

## 2022-06-11 NOTE — Plan of Care (Signed)
Pt AAOx4, no pain. VS are WNL. Pt upset about fluid restriction, and threatened to leave AMA. MD notified. Plan for abx and possible paracentesis today. Bed in lowest position, call light within reach. Will continue to monitor.

## 2022-06-11 NOTE — Progress Notes (Signed)
Patient overnight very upset about his fluid restriction. Stating at one point he was getting IV fluids so doesn't understand why he can't drink. Then stating they want me to drink ensures 5 times a day but that counts towards my limit.  Tried to keep patient limited on his fluids even though he didn't like it. He stated he could just get water from faucet anyway's.  Patient also is having fluid leaking from previous paracentesis site. Dressing applied to this. Patient stating his body is not healing and he is upset about it. "Why is the site not healed and it wasn't leaking yesterday" "These bruises aren't going away that I have"  Patient refused his dressing change for his knee because they changed it yesterday at 4pm.  Patient is A&O, breathing regular unlabored. Will continue to monitor patient.

## 2022-06-11 NOTE — Progress Notes (Signed)
On assessment, patient reports "I disconnect the IV when the medication is done, I know all about that".   Patient was encouraged not to disconnect himself from IV lines while running, and education was provided.   Patient verbalizes understanding and states, "someone better be in here when it's finished".   Education was provided regarding safety with PIV lines and current infection.

## 2022-06-11 NOTE — Progress Notes (Signed)
PROGRESS NOTE    Marco Spears  QMG:[id number] DOB: 08-09-1976 DOA: 06/01/2022 PCP: Pcp, No    Brief Narrative:  46 y.o. male seen in ed brought by EMS for altered mental status where patient was picked up from a home where he had been on meth.  Patient's right knee incision had clear drainage per report patient was intermittently arousable but restless agitated and almost paranoid.  Patient reports pain over the right knee.  Patient gets startled with sound. Patient is altered and somnolent and not oriented and responds to when called but falls asleep immediately.   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  Patient had received Harvoni for his hep C and has had a DVT for which she has taken Eliquis.  Patient has past medical history of allergies to amoxicillin. Patient had diarrhea right knee recurrent septic prepatellar bursitis and has had I&D on 05/07/2022.  Patient had paracentesis on 05/22/2022 with 3.6 L of hazy yellow fluid.  Last paracentesis was on 05/26/2022 where he had 4.8 L removed. Patient is a large hematoma about 12 cm on his lower abdominal area more towards suprapubic area.   Infectious disease following.  GI signed off as of 8/9.  Home diuretics resumed. Mental status improved on 8/10.  Continues to endorse severe muscle spasms Status post ultrasound-guided paracentesis 8/11.  6 L fluid liberated Status post repeat ultrasound-guided paracentesis 8/14.  4.4 L fluid removed  8/16 had small bm yesterday, wants enema 8/17 threatening to leave AMA if his fluid restriction on his diet is not discontinued. Also wants paracentesis as he fels his abdomen more distended.  If he is not On fluid restriction is easier for him to come volume overloaded however he disagrees and wants that restriction to be discontinued  Assessment & Plan:   Principal Problem:   AMS (altered mental  status) Active Problems:   Alcoholic cirrhosis of liver with ascites (HCC)   Anemia   Septic arthritis (HCC)   Hypokalemia   Polysubstance abuse (HCC)   Thrombocytopenia (HCC)   Portal hypertension (HCC)   Bacteremia   Malnutrition of moderate degree  Acute metabolic encephalopathy Likely multifactorial.  Patient is a admitted methamphetamine user.  Also has infection which could be contributing.  Patient remains somewhat lethargic with tangential thought and mumbling.  CT head normal. 8/10: Mental status improved.  Appears to be back to baseline Plan: 8/17 MS at baseline  More sedated if     Septic arthritis right knee ESBL E. coli bacteremia Blood culture positive from 8/8.  Infectious disease on board.  Was previously getting vancomycin and Rocephin.  Changed to meropenem.  Orthopedics consulted.  Incisional wound VAC in place by [facility] team.  Application of knee immobilizer. Plan: 8/16 continue meropenem until 8/21 VAC d/c'd on 8/15, cleared for dc from orthopedics stand point. F/u ortho in 7-10 days. 8/17 based on orthopedics assessment yesterday patient continues to have serosanguineous drainage from his anterior knee incision.  Wound healing is compromised due to his multiple comorbidities.  Plan is to have his VAC dressing replaced or Prevena dressing may need to be used.  There is concern since he is homeless to be discharged on Previfem a dressing after discharge.  He is noncompliant with his antibiotics as outpatient.  Anemia Thrombocytopenia Portal hypertension Spontaneous bacterial peritonitis Hepatic cirrhosis No evidence of bleeding Currently no indication for PRBC or platelet transfusion Will monitor  closely for bleed Continue meropenem as above as this will cover for SBP as well Continue home Lasix and Aldactone 8/11: Repeat ultrasound-guided paracentesis ordered 8/11: Ultrasound-guided paracentesis.  6 L fluid removed 8/14: Ultrasound-guided paracentesis.  4.4  L fluid removed 8/17 H&H stable continue to monitor plan for IR to do therapeutic paracentesis today     Polysubstance use Counseled patient 8/17 avoid narcotics       DVT prophylaxis: SCD Code Status: Full Family Communication: None Disposition Plan: Status is: Inpatient Remains inpatient appropriate because: Right knee septic arthritis on IV antibiotics until 8/21, due to hx/o drug abuse  Level of care: Med-Surg  Consultants:  ID, ortho  Procedures:  Paracentesis Knee arthrocentesis  Antimicrobials: Meropenem   Subjective: C/o abd distention and leakage from dressing. Wants IR tap. No abd pain. No n/v  Objective: Vitals:   06/10/22 0325 06/10/22 1528 06/10/22 2001 06/11/22 0554  BP: 114/70 120/78 111/62 111/62  Pulse: 84 83 88 93  Resp: 16 18 17 18   Temp: 98.2 F (36.8 C) 98.1 F (36.7 C) 98.2 F (36.8 C) 98.2 F (36.8 C)  TempSrc: Oral Oral Oral Oral  SpO2: 100% 99% 99% 96%  Weight:      Height:        Intake/Output Summary (Last 24 hours) at 06/11/2022 0812 Last data filed at 06/11/2022 0600 Gross per 24 hour  Intake 1097 ml  Output --  Net 1097 ml   Filed Weights   06/05/22 0500 06/06/22 0417 06/07/22 0500  Weight: 107.2 kg 110.7 kg 108.9 kg    Examination: Calm, NAD Cta no w/r Reg s1/s2 no gallop Soft distended, non tender +edema LE  Grossly intact. Awake and alert Mood and affect appropriate in current setting     Data Reviewed: I have personally reviewed following labs and imaging studies  CBC: Recent Labs  Lab 06/04/22 0817 06/05/22 0755 06/07/22 0740 06/09/22 1036  WBC 3.3* 2.9* 3.8* 2.8*  NEUTROABS 2.1 1.8 2.5 1.7  HGB 7.9* 7.9* 8.5* 8.9*  HCT 22.6* 23.1* 25.2* 27.2*  MCV 97.8 98.7 100.0 100.7*  PLT 33* 45* 63* 75*   Basic Metabolic Panel: Recent Labs  Lab 06/04/22 0817 06/05/22 0755 06/07/22 0740 06/09/22 1036  NA 131* 134* 136 134*  K 2.8* 3.9 4.1 3.9  CL 103 108 106 103  CO2 23 23 26 26   GLUCOSE 152*  123* 115* 140*  BUN 11 11 11 14   CREATININE 0.63 0.59* 0.62 0.59*  CALCIUM 7.6* 7.6* 8.0* 8.0*   GFR: Estimated Creatinine Clearance: 150.6 mL/min (A) (by C-G formula based on SCr of 0.59 mg/dL (L)). Liver Function Tests: Recent Labs  Lab 06/04/22 0817 06/09/22 1036  AST 49* 62*  ALT 30 55*  ALKPHOS 67 142*  BILITOT 2.2* 2.2*  PROT 4.9* 5.1*  ALBUMIN 2.6* 2.5*   No results for input(s): "LIPASE", "AMYLASE" in the last 168 hours. No results for input(s): "AMMONIA" in the last 168 hours.  Coagulation Profile: No results for input(s): "INR", "PROTIME" in the last 168 hours.  Cardiac Enzymes: No results for input(s): "CKTOTAL", "CKMB", "CKMBINDEX", "TROPONINI" in the last 168 hours.  BNP (last 3 results) No results for input(s): "PROBNP" in the last 8760 hours. HbA1C: No results for input(s): "HGBA1C" in the last 72 hours. CBG: No results for input(s): "GLUCAP" in the last 168 hours.  Lipid Profile: No results for input(s): "CHOL", "HDL", "LDLCALC", "TRIG", "CHOLHDL", "LDLDIRECT" in the last 72 hours. Thyroid Function Tests: No results for input(s): "TSH", "  T4TOTAL", "FREET4", "T3FREE", "THYROIDAB" in the last 72 hours.  Anemia Panel: No results for input(s): "VITAMINB12", "FOLATE", "FERRITIN", "TIBC", "IRON", "RETICCTPCT" in the last 72 hours. Sepsis Labs: No results for input(s): "PROCALCITON", "LATICACIDVEN" in the last 168 hours.   Recent Results (from the past 240 hour(s))  Resp Panel by RT-PCR (Flu A&B, Covid) Anterior Nasal Swab     Status: None   Collection Time: 06/01/22 12:34 PM   Specimen: Anterior Nasal Swab  Result Value Ref Range Status   SARS Coronavirus 2 by RT PCR NEGATIVE NEGATIVE Final    Comment: (NOTE) SARS-CoV-2 target nucleic acids are NOT DETECTED.  The SARS-CoV-2 RNA is generally detectable in upper respiratory specimens during the acute phase of infection. The lowest concentration of SARS-CoV-2 viral copies this assay can detect is 138  copies/mL. A negative result does not preclude SARS-Cov-2 infection and should not be used as the sole basis for treatment or other patient management decisions. A negative result may occur with  improper specimen collection/handling, submission of specimen other than nasopharyngeal swab, presence of viral mutation(s) within the areas targeted by this assay, and inadequate number of viral copies(<138 copies/mL). A negative result must be combined with clinical observations, patient history, and epidemiological information. The expected result is Negative.  Fact Sheet for Patients:  BloggerCourse.com  Fact Sheet for Healthcare Providers:  SeriousBroker.it  This test is no t yet approved or cleared by the [place] FDA and  has been authorized for detection and/or diagnosis of SARS-CoV-2 by FDA under an Emergency Use Authorization (EUA). This EUA will remain  in effect (meaning this test can be used) for the duration of the COVID-19 declaration under Section 564(b)(1) of the Act, 21 U.S.C.section 360bbb-3(b)(1), unless the authorization is terminated  or revoked sooner.       Influenza A by PCR NEGATIVE NEGATIVE Final   Influenza B by PCR NEGATIVE NEGATIVE Final    Comment: (NOTE) The Xpert Xpress SARS-CoV-2/FLU/RSV plus assay is intended as an aid in the diagnosis of influenza from Nasopharyngeal swab specimens and should not be used as a sole basis for treatment. Nasal washings and aspirates are unacceptable for Xpert Xpress SARS-CoV-2/FLU/RSV testing.  Fact Sheet for Patients: BloggerCourse.com  Fact Sheet for Healthcare Providers: SeriousBroker.it  This test is not yet approved or cleared by the [place] FDA and has been authorized for detection and/or diagnosis of SARS-CoV-2 by FDA under an Emergency Use Authorization (EUA). This EUA will remain in effect (meaning  this test can be used) for the duration of the COVID-19 declaration under Section 564(b)(1) of the Act, 21 U.S.C. section 360bbb-3(b)(1), unless the authorization is terminated or revoked.  Performed at [facility], [address] [place], [place] [address]   Blood Culture (routine x 2)     Status: Abnormal   Collection Time: 06/01/22 12:34 PM   Specimen: BLOOD  Result Value Ref Range Status   Specimen Description   Final    BLOOD Blood Culture adequate volume Performed at [facility], [address] [place], [place] [address]    Special Requests   Final    BOTTLES DRAWN AEROBIC AND ANAEROBIC LEFT ANTECUBITAL Performed at [facility], [address] [place], [place] [address]    Culture  Setup Time   Final    GRAM NEGATIVE RODS ANAEROBIC BOTTLE ONLY CRITICAL RESULT CALLED TO, READ BACK BY AND VERIFIED WITH: NATHAN BELUE AT 0017 ON 06/02/22 BY SS    Culture (A)  Final  ESCHERICHIA COLI Confirmed Extended Spectrum Beta-Lactamase Producer (ESBL).  In bloodstream infections from ESBL organisms, carbapenems are preferred over piperacillin/tazobactam. They are shown to have a lower risk of mortality.    Report Status 06/04/2022 FINAL  Final   Organism ID, Bacteria ESCHERICHIA COLI  Final      Susceptibility   Escherichia coli - MIC*    AMPICILLIN >=32 RESISTANT Resistant     CEFAZOLIN >=64 RESISTANT Resistant     CEFEPIME 16 RESISTANT Resistant     CEFTAZIDIME RESISTANT Resistant     CEFTRIAXONE >=64 RESISTANT Resistant     CIPROFLOXACIN >=4 RESISTANT Resistant     GENTAMICIN <=1 SENSITIVE Sensitive     IMIPENEM <=0.25 SENSITIVE Sensitive     TRIMETH/SULFA >=320 RESISTANT Resistant     AMPICILLIN/SULBACTAM 16 INTERMEDIATE Intermediate     PIP/TAZO <=4 SENSITIVE Sensitive     * ESCHERICHIA COLI  Blood Culture (routine x 2)     Status: None   Collection Time: 06/01/22 12:34 PM   Specimen: BLOOD  Result Value Ref Range Status   Specimen  Description   Final    BLOOD Blood Culture results may not be optimal due to an inadequate volume of blood received in culture bottles   Special Requests BOTTLES DRAWN AEROBIC AND ANAEROBIC WRIST  Final   Culture   Final    NO GROWTH 5 DAYS Performed at [facility], [address] [place], [place] [address]    Report Status 06/06/2022 FINAL  Final  Blood Culture ID Panel (Reflexed)     Status: Abnormal   Collection Time: 06/01/22 12:34 PM  Result Value Ref Range Status   Enterococcus faecalis NOT DETECTED NOT DETECTED Final   Enterococcus Faecium NOT DETECTED NOT DETECTED Final   Listeria monocytogenes NOT DETECTED NOT DETECTED Final   Staphylococcus species NOT DETECTED NOT DETECTED Final   Staphylococcus aureus (BCID) NOT DETECTED NOT DETECTED Final   Staphylococcus epidermidis NOT DETECTED NOT DETECTED Final   Staphylococcus lugdunensis NOT DETECTED NOT DETECTED Final   Streptococcus species NOT DETECTED NOT DETECTED Final   Streptococcus agalactiae NOT DETECTED NOT DETECTED Final   Streptococcus pneumoniae NOT DETECTED NOT DETECTED Final   Streptococcus pyogenes NOT DETECTED NOT DETECTED Final   A.calcoaceticus-baumannii NOT DETECTED NOT DETECTED Final   Bacteroides fragilis NOT DETECTED NOT DETECTED Final   Enterobacterales DETECTED (A) NOT DETECTED Final    Comment: Enterobacterales represent a large order of gram negative bacteria, not a single organism. CRITICAL RESULT CALLED TO, READ BACK BY AND VERIFIED WITH: NATHAN BELUE AT 0017 ON 06/02/22 BY SS    Enterobacter cloacae complex NOT DETECTED NOT DETECTED Final   Escherichia coli DETECTED (A) NOT DETECTED Final    Comment: CRITICAL RESULT CALLED TO, READ BACK BY AND VERIFIED WITH: NATHAN BELUE AT 0017 ON 06/02/22 BY SS    Klebsiella aerogenes NOT DETECTED NOT DETECTED Final   Klebsiella oxytoca NOT DETECTED NOT DETECTED Final   Klebsiella pneumoniae NOT DETECTED NOT DETECTED Final   Proteus species NOT DETECTED  NOT DETECTED Final   Salmonella species NOT DETECTED NOT DETECTED Final   Serratia marcescens NOT DETECTED NOT DETECTED Final   Haemophilus influenzae NOT DETECTED NOT DETECTED Final   Neisseria meningitidis NOT DETECTED NOT DETECTED Final   Pseudomonas aeruginosa NOT DETECTED NOT DETECTED Final   Stenotrophomonas maltophilia NOT DETECTED NOT DETECTED Final   Candida albicans NOT DETECTED NOT DETECTED Final   Candida auris NOT DETECTED NOT DETECTED Final   Candida glabrata NOT  DETECTED NOT DETECTED Final   Candida krusei NOT DETECTED NOT DETECTED Final   Candida parapsilosis NOT DETECTED NOT DETECTED Final   Candida tropicalis NOT DETECTED NOT DETECTED Final   Cryptococcus neoformans/gattii NOT DETECTED NOT DETECTED Final   CTX-M ESBL DETECTED (A) NOT DETECTED Final    Comment: CRITICAL RESULT CALLED TO, READ BACK BY AND VERIFIED WITH: NATHAN BELUE AT 0017 ON 06/02/22 BY SS (NOTE) Extended spectrum beta-lactamase detected. Recommend a carbapenem as initial therapy.      Carbapenem resistance IMP NOT DETECTED NOT DETECTED Final   Carbapenem resistance KPC NOT DETECTED NOT DETECTED Final   Carbapenem resistance NDM NOT DETECTED NOT DETECTED Final   Carbapenem resist OXA 48 LIKE NOT DETECTED NOT DETECTED Final   Carbapenem resistance VIM NOT DETECTED NOT DETECTED Final    Comment: Performed at [facility], [address] [place], [place] [address]  Urine Culture     Status: None   Collection Time: 06/01/22  1:06 PM   Specimen: In/Out Cath Urine  Result Value Ref Range Status   Specimen Description   Final    IN/OUT CATH URINE Performed at [facility], [address], [place], [place] [address]    Special Requests   Final    NONE Performed at [facility], [address], [place], [place] [address]    Culture   Final    NO GROWTH Performed at [facility], [address] [place], [place] [address]    Report Status 06/02/2022 FINAL  Final   Aerobic Culture w Gram Stain (superficial specimen)     Status: None   Collection Time: 06/01/22  4:35 PM   Specimen: Wound  Result Value Ref Range Status   Specimen Description   Final    WOUND Performed at [facility], [address] [place], [place] [address]    Special Requests   Final    RT KNEE Performed at [facility], [address] [place], [place] [address]    Gram Stain   Final    RARE WBC PRESENT, PREDOMINANTLY PMN RARE GRAM NEGATIVE RODS    Culture   Final    FEW KLEBSIELLA OXYTOCA FEW ACINETOBACTER CALCOACETICUS/BAUMANNII COMPLEX RARE PSEUDOMONAS AERUGINOSA Confirmed Extended Spectrum Beta-Lactamase Producer (ESBL).  In bloodstream infections from ESBL organisms, carbapenems are preferred over piperacillin/tazobactam. They are shown to have a lower risk of mortality. FOR KLEBSIELLA Performed at [facility], [address] [place], [place] [address]    Report Status 06/07/2022 FINAL  Final   Organism ID, Bacteria ACINETOBACTER CALCOACETICUS/BAUMANNII COMPLEX  Final   Organism ID, Bacteria KLEBSIELLA OXYTOCA  Final   Organism ID, Bacteria PSEUDOMONAS AERUGINOSA  Final      Susceptibility   Acinetobacter calcoaceticus/baumannii complex - MIC*    CEFTAZIDIME 8 SENSITIVE Sensitive     CIPROFLOXACIN 1 SENSITIVE Sensitive     GENTAMICIN <=1 SENSITIVE Sensitive     IMIPENEM <=0.25 SENSITIVE Sensitive     PIP/TAZO 8 SENSITIVE Sensitive     TRIMETH/SULFA >=320 RESISTANT Resistant     AMPICILLIN/SULBACTAM <=2 SENSITIVE Sensitive     * FEW ACINETOBACTER CALCOACETICUS/BAUMANNII COMPLEX   Klebsiella oxytoca - MIC*    AMPICILLIN >=32 RESISTANT Resistant     CEFAZOLIN >=64 RESISTANT Resistant     CEFEPIME >=32 RESISTANT Resistant     CEFTAZIDIME RESISTANT Resistant     CEFTRIAXONE >=64 RESISTANT Resistant     CIPROFLOXACIN 1 RESISTANT Resistant     GENTAMICIN >=  16 RESISTANT Resistant     IMIPENEM <=0.25 SENSITIVE Sensitive      TRIMETH/SULFA >=320 RESISTANT Resistant     AMPICILLIN/SULBACTAM >=32 RESISTANT Resistant     PIP/TAZO 16 SENSITIVE Sensitive     * FEW KLEBSIELLA OXYTOCA   Pseudomonas aeruginosa - MIC*    CEFTAZIDIME 4 SENSITIVE Sensitive     CIPROFLOXACIN <=0.25 SENSITIVE Sensitive     GENTAMICIN <=1 SENSITIVE Sensitive     IMIPENEM 2 SENSITIVE Sensitive     PIP/TAZO 8 SENSITIVE Sensitive     CEFEPIME 2 SENSITIVE Sensitive     * RARE PSEUDOMONAS AERUGINOSA  MRSA Next Gen by PCR, Nasal     Status: None   Collection Time: 06/01/22  9:00 PM   Specimen: Nasal Mucosa; Nasal Swab  Result Value Ref Range Status   MRSA by PCR Next Gen NOT DETECTED NOT DETECTED Final    Comment: (NOTE) The GeneXpert MRSA Assay (FDA approved for NASAL specimens only), is one component of a comprehensive MRSA colonization surveillance program. It is not intended to diagnose MRSA infection nor to guide or monitor treatment for MRSA infections. Test performance is not FDA approved in patients less than 18 years old. Performed at [facility], [address] [place], [place] [address]   Aerobic/Anaerobic Culture w Gram Stain (surgical/deep wound)     Status: None   Collection Time: 06/02/22  2:37 PM   Specimen: PATH Cytology Peritoneal fluid  Result Value Ref Range Status   Specimen Description   Final    PERITONEAL Performed at [facility], [address] [place], [place] [address]    Special Requests   Final    NONE Performed at [facility], [address] [place], [place] [address]    Gram Stain   Final    FEW WBC PRESENT,BOTH PMN AND MONONUCLEAR NO ORGANISMS SEEN    Culture   Final    No growth aerobically or anaerobically. Performed at [facility], [address], [place], [place] [address]    Report Status 06/07/2022 FINAL  Final  Acid Fast Smear (AFB)     Status: None   Collection Time: 06/02/22  2:37 PM   Specimen: PATH Cytology Peritoneal fluid  Result Value  Ref Range Status   AFB Specimen Processing Concentration  Final   Acid Fast Smear Negative  Final    Comment: (NOTE) Performed At: [facility] [address] [place], [place] [id number] Jolene Schimke MD YB:[id number]    Source (AFB) PERI  Final    Comment: Performed at [facility], [address] [place], [place] [address]  Culture, blood (Routine X 2) w Reflex to ID Panel     Status: None   Collection Time: 06/05/22  4:14 AM   Specimen: BLOOD  Result Value Ref Range Status   Specimen Description BLOOD BLOOD RIGHT HAND  Final   Special Requests   Final    BOTTLES DRAWN AEROBIC AND ANAEROBIC Blood Culture adequate volume   Culture   Final    NO GROWTH 5 DAYS Performed at [facility], [address] [place], [place] [address]    Report Status 06/10/2022 FINAL  Final  Culture, blood (Routine X 2) w Reflex to ID Panel     Status: None   Collection Time: 06/05/22  4:14 AM   Specimen: BLOOD  Result Value Ref Range Status   Specimen Description BLOOD BLOOD LEFT HAND  Final   Special Requests  Final    IN PEDIATRIC BOTTLE Blood Culture results may not be optimal due to an excessive volume of blood received in culture bottles   Culture   Final    NO GROWTH 5 DAYS Performed at [facility], [address] [place], [place] [address]    Report Status 06/10/2022 FINAL  Final  Body fluid culture w Gram Stain     Status: None (Preliminary result)   Collection Time: 06/08/22 11:39 AM   Specimen: PATH Cytology Peritoneal fluid  Result Value Ref Range Status   Specimen Description   Final    PERITONEAL Performed at [facility], [address], [place], [place] [address]    Special Requests   Final    NONE Performed at [facility], [address] [place], [place] [address]    Gram Stain   Final    WBC PRESENT,BOTH PMN AND MONONUCLEAR NO ORGANISMS SEEN CYTOSPIN SMEAR    Culture   Final    NO GROWTH 2 DAYS Performed  at [facility], [address], [place], [place] [address]    Report Status PENDING  Incomplete         Radiology Studies: No results found.      Scheduled Meds:  feeding supplement  237 mL Oral TID BM   ferrous sulfate  325 mg Oral Q breakfast   folic acid  1 mg Oral Daily   furosemide  40 mg Oral BID   gabapentin  300 mg Oral TID   methocarbamol  750 mg Oral TID   multivitamin with minerals  1 tablet Oral Daily   nicotine  21 mg Transdermal Daily   nystatin  5 mL Oral QID   polyethylene glycol  17 g Oral Daily   senna-docusate  1 tablet Oral BID   sodium chloride flush  3 mL Intravenous Q12H   spironolactone  100 mg Oral Daily   thiamine (VITAMIN B1) injection  100 mg Intravenous Daily   Continuous Infusions:  sodium chloride Stopped (06/06/22 0521)   albumin human     meropenem (MERREM) IV 1 g (06/11/22 0100)     LOS: 10 days   Time spent:  Lynn Ito, MD [facility]   If 7PM-7AM, please contact night-coverage  06/11/2022, 8:12 AM

## 2022-06-12 DIAGNOSIS — M71161 Other infective bursitis, right knee: Secondary | ICD-10-CM

## 2022-06-12 NOTE — TOC Progression Note (Signed)
Transition of Care [facility]) - Progression Note    Patient Details  Name: Marco Spears MRN: [id number] Date of Birth: 26-Nov-1975  Transition of Care [facility]) CM/SW Contact  Liliana Cline, LCSW Phone Number: 06/12/2022, 2:39 PM  Clinical Narrative:    Per RN, patient asking about a "medical card". CSW confirmed with Financial Counseling that patient does not qualify for Medicaid at this time but is being followed by [facility].  CSW attempted to call into patient's room due to patient being on contact precautions to determine what medical card he is referring to. No answer. TOC will continue to follow.    Expected Discharge Plan: Homeless Shelter Barriers to Discharge: Continued Medical Work up  Expected Discharge Plan and Services Expected Discharge Plan: Homeless Shelter                                               Social Determinants of Health (SDOH) Interventions    Readmission Risk Interventions    05/06/2022    2:26 PM  Readmission Risk Prevention Plan  Transportation Screening Complete  Medication Review Oceanographer) Complete

## 2022-06-12 NOTE — Progress Notes (Signed)
PROGRESS NOTE    HARTMAN STILLSON  [id number] DOB: 06-02-1976 DOA: 06/01/2022 PCP: Pcp, No    Brief Narrative:  46 y.o. male seen in ed brought by EMS for altered mental status where patient was picked up from a home where he had been on meth.  Patient's right knee incision had clear drainage per report patient was intermittently arousable but restless agitated and almost paranoid.  Patient reports pain over the right knee.  Patient gets startled with sound. Patient is altered and somnolent and not oriented and responds to when called but falls asleep immediately.   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  Patient had received Harvoni for his hep C and has had a DVT for which she has taken Eliquis.  Patient has past medical history of allergies to amoxicillin. Patient had diarrhea right knee recurrent septic prepatellar bursitis and has had I&D on 05/07/2022.  Patient had paracentesis on 05/22/2022 with 3.6 L of hazy yellow fluid.  Last paracentesis was on 05/26/2022 where he had 4.8 L removed. Patient is a large hematoma about 12 cm on his lower abdominal area more towards suprapubic area.   Infectious disease following.  GI signed off as of 8/9.  Home diuretics resumed. Mental status improved on 8/10.  Continues to endorse severe muscle spasms Status post ultrasound-guided paracentesis 8/11.  6 L fluid liberated Status post repeat ultrasound-guided paracentesis 8/14.  4.4 L fluid removed  8/16 had small bm yesterday, wants enema 8/17 threatening to leave AMA if his fluid restriction on his diet is not discontinued. Also wants paracentesis as he fels his abdomen more distended.  If he is not On fluid restriction is easier for him to come volume overloaded however he disagrees and wants that restriction to be discontinued  8/18 no complaints other than wants to take shower  Assessment & Plan:    Principal Problem:   AMS (altered mental status) Active Problems:   Alcoholic cirrhosis of liver with ascites (HCC)   Anemia   Septic arthritis (HCC)   Hypokalemia   Polysubstance abuse (HCC)   Thrombocytopenia (HCC)   Portal hypertension (HCC)   Bacteremia   Malnutrition of moderate degree  Acute metabolic encephalopathy Likely multifactorial.  Patient is a admitted methamphetamine user.  Also has infection which could be contributing.  Patient remains somewhat lethargic with tangential thought and mumbling.  CT head normal. 8/10: Mental status improved.  Appears to be back to baseline Plan: 8/18 MS at baseline  Avoid sedation      Septic arthritis right knee ESBL E. coli bacteremia Blood culture positive from 8/8.  Infectious disease on board.  Was previously getting vancomycin and Rocephin.  Changed to meropenem.  Orthopedics consulted.  Incisional wound VAC in place by [facility] team.  Application of knee immobilizer. Plan: 8/16 continue meropenem until 8/21 VAC d/c'd on 8/15, cleared for dc from orthopedics stand point. F/u ortho in 7-10 days. 8/17 based on orthopedics assessment yesterday patient continues to have serosanguineous drainage from his anterior knee incision.  Wound healing is compromised due to his multiple comorbidities.  Plan is to have his VAC dressing replaced or Prevena dressing may need to be used.  There is concern since he is homeless to be discharged on Previfem a dressing after discharge.  He is noncompliant with his antibiotics as outpatient. 8/18 Per Ortho patient may need Prevena or VAC dressing if he continues to  have serosanguineous drainage from his surgical site  Anemia Thrombocytopenia Portal hypertension Spontaneous bacterial peritonitis Hepatic cirrhosis No evidence of bleeding Currently no indication for PRBC or platelet transfusion Will monitor closely for bleed Continue meropenem as above as this will cover for SBP as well Continue home  Lasix and Aldactone 8/11: Repeat ultrasound-guided paracentesis ordered 8/11: Ultrasound-guided paracentesis.  6 L fluid removed 8/14: Ultrasound-guided paracentesis.  4.4 L fluid removed 8/18 H&H stable  Status post IR on 8/17 for therapeutic paracentesis with removal of 4.5 L of clear yellow fluid.     Polysubstance use Counseled patient 8/18 avoid narcotics         DVT prophylaxis: SCD Code Status: Full Family Communication: None Disposition Plan: Status is: Inpatient Remains inpatient appropriate because: Right knee septic arthritis on IV antibiotics until 8/21, due to hx/o drug abuse  Level of care: Med-Surg  Consultants:  ID, ortho, IR  Procedures:  Paracentesis Knee arthrocentesis  Antimicrobials: Meropenem   Subjective: No abdominal pain or nausea, no shortness of breath or chest pain    Objective: Vitals:   06/11/22 1158 06/11/22 2001 06/12/22 0444 06/12/22 0806  BP: (!) 107/58 120/73 (!) 109/56 104/61  Pulse: 76 88 90 86  Resp:  16 16 18   Temp:  98.4 F (36.9 C) 98.3 F (36.8 C) 98.2 F (36.8 C)  TempSrc:  Oral Oral Oral  SpO2: 98% 100% 97% 97%  Weight:      Height:        Intake/Output Summary (Last 24 hours) at 06/12/2022 1221 Last data filed at 06/12/2022 0259 Gross per 24 hour  Intake 2000.44 ml  Output --  Net 2000.44 ml   Filed Weights   06/05/22 0500 06/06/22 0417 06/07/22 0500  Weight: 107.2 kg 110.7 kg 108.9 kg    Examination: Calm, NAD Cta no w/r Reg s1/s2 no gallop Soft benign +bs No edema Grossly intact Mood and affect appropriate in current setting     Data Reviewed: I have personally reviewed following labs and imaging studies  CBC: Recent Labs  Lab 06/07/22 0740 06/09/22 1036  WBC 3.8* 2.8*  NEUTROABS 2.5 1.7  HGB 8.5* 8.9*  HCT 25.2* 27.2*  MCV 100.0 100.7*  PLT 63* 75*   Basic Metabolic Panel: Recent Labs  Lab 06/07/22 0740 06/09/22 1036  NA 136 134*  K 4.1 3.9  CL 106 103  CO2 26 26   GLUCOSE 115* 140*  BUN 11 14  CREATININE 0.62 0.59*  CALCIUM 8.0* 8.0*   GFR: Estimated Creatinine Clearance: 150.6 mL/min (A) (by C-G formula based on SCr of 0.59 mg/dL (L)). Liver Function Tests: Recent Labs  Lab 06/09/22 1036  AST 62*  ALT 55*  ALKPHOS 142*  BILITOT 2.2*  PROT 5.1*  ALBUMIN 2.5*   No results for input(s): "LIPASE", "AMYLASE" in the last 168 hours. No results for input(s): "AMMONIA" in the last 168 hours.  Coagulation Profile: No results for input(s): "INR", "PROTIME" in the last 168 hours.  Cardiac Enzymes: No results for input(s): "CKTOTAL", "CKMB", "CKMBINDEX", "TROPONINI" in the last 168 hours.  BNP (last 3 results) No results for input(s): "PROBNP" in the last 8760 hours. HbA1C: No results for input(s): "HGBA1C" in the last 72 hours. CBG: No results for input(s): "GLUCAP" in the last 168 hours.  Lipid Profile: No results for input(s): "CHOL", "HDL", "LDLCALC", "TRIG", "CHOLHDL", "LDLDIRECT" in the last 72 hours. Thyroid Function Tests: No results for input(s): "TSH", "T4TOTAL", "FREET4", "T3FREE", "THYROIDAB" in the last 72 hours.  Anemia Panel: No results for input(s): "VITAMINB12", "FOLATE", "FERRITIN", "TIBC", "IRON", "RETICCTPCT" in the last 72 hours. Sepsis Labs: No results for input(s): "PROCALCITON", "LATICACIDVEN" in the last 168 hours.   Recent Results (from the past 240 hour(s))  Aerobic/Anaerobic Culture w Gram Stain (surgical/deep wound)     Status: None   Collection Time: 06/02/22  2:37 PM   Specimen: PATH Cytology Peritoneal fluid  Result Value Ref Range Status   Specimen Description   Final    PERITONEAL Performed at [facility], [address] [place], [place] [address]    Special Requests   Final    NONE Performed at [facility], [address] [place], [place] [address]    Gram Stain   Final    FEW WBC PRESENT,BOTH PMN AND MONONUCLEAR NO ORGANISMS SEEN    Culture   Final    No growth  aerobically or anaerobically. Performed at [facility], [place] [address], [place], [place] [address]    Report Status 06/07/2022 FINAL  Final  Acid Fast Smear (AFB)     Status: None   Collection Time: 06/02/22  2:37 PM   Specimen: PATH Cytology Peritoneal fluid  Result Value Ref Range Status   AFB Specimen Processing Concentration  Final   Acid Fast Smear Negative  Final    Comment: (NOTE) Performed At: [facility] [place], [place] [id number] Rush Farmer MD [id number]    Source (AFB) PERI  Final    Comment: Performed at [facility], [address] [place], [place] [address]  Culture, blood (Routine X 2) w Reflex to ID Panel     Status: None   Collection Time: 06/05/22  4:14 AM   Specimen: BLOOD  Result Value Ref Range Status   Specimen Description BLOOD BLOOD RIGHT HAND  Final   Special Requests   Final    BOTTLES DRAWN AEROBIC AND ANAEROBIC Blood Culture adequate volume   Culture   Final    NO GROWTH 5 DAYS Performed at [facility], [address] [place], [place] [address]    Report Status 06/10/2022 FINAL  Final  Culture, blood (Routine X 2) w Reflex to ID Panel     Status: None   Collection Time: 06/05/22  4:14 AM   Specimen: BLOOD  Result Value Ref Range Status   Specimen Description BLOOD BLOOD LEFT HAND  Final   Special Requests   Final    IN PEDIATRIC BOTTLE Blood Culture results may not be optimal due to an excessive volume of blood received in culture bottles   Culture   Final    NO GROWTH 5 DAYS Performed at [facility], [address], [place], [place] [address]    Report Status 06/10/2022 FINAL  Final  Body fluid culture w Gram Stain     Status: None   Collection Time: 06/08/22 11:39 AM   Specimen: PATH Cytology Peritoneal fluid  Result Value Ref Range Status   Specimen Description   Final    PERITONEAL Performed at [facility], [address] [place], [place] [address]     Special Requests   Final    NONE Performed at [facility], [address] [place],  [address]    Gram Stain   Final    WBC PRESENT,BOTH PMN AND MONONUCLEAR NO ORGANISMS SEEN CYTOSPIN SMEAR    Culture   Final    NO GROWTH 3 DAYS Performed at [facility], [address], [place], [place] [address]    Report Status 06/11/2022 FINAL  Final         Radiology Studies: US Paracentesis  Result Date: 06/11/2022 INDICATION: Patient with history of cirrhosis and recurrent ascites request received for therapeutic paracentesis. EXAM: ULTRASOUND GUIDED  PARACENTESIS MEDICATIONS: Local 1% lidocaine only. COMPLICATIONS: None immediate. PROCEDURE: Informed written consent was obtained from the patient after a discussion of the risks, benefits and alternatives to treatment. A timeout was performed prior to the initiation of the procedure. Initial ultrasound scanning demonstrates a large amount of ascites within the right upper abdominal quadrant. The right upper abdomen was prepped and draped in the usual sterile fashion. 1% lidocaine was used for local anesthesia. Following this, a 19 gauge, 7-cm, Yueh catheter was introduced. An ultrasound image was saved for documentation purposes. The paracentesis was performed. The catheter was removed and a dressing was applied. The patient tolerated the procedure well without immediate post procedural complication. FINDINGS: A total of approximately 4.5 L of clear yellow fluid was removed. IMPRESSION: Successful ultrasound-guided paracentesis yielding 4.5 liters of peritoneal fluid. PLAN: The patient has required >/=2 paracenteses in a 30 day period and a formal evaluation by the [facility] Interventional Radiology Portal Hypertension [facility] has been arranged. This exam was performed by Pattricia Boss PA-C, and was supervised and interpreted by Dr. Juliette Alcide. Electronically Signed   By: Olive Bass M.D.   On: 06/11/2022 13:39         Scheduled Meds:  feeding supplement  237 mL Oral TID BM   ferrous sulfate  325 mg Oral Q breakfast   folic acid  1 mg Oral Daily   furosemide  40 mg Oral BID   gabapentin  300 mg Oral TID   methocarbamol  750 mg Oral TID   multivitamin with minerals  1 tablet Oral Daily   nicotine  21 mg Transdermal Daily   nystatin  5 mL Oral QID   polyethylene glycol  17 g Oral Daily   senna-docusate  1 tablet Oral BID   sodium chloride flush  3 mL Intravenous Q12H   spironolactone  100 mg Oral Daily   thiamine (VITAMIN B1) injection  100 mg Intravenous Daily   Continuous Infusions:  sodium chloride Stopped (06/09/22 0255)   albumin human     meropenem (MERREM) IV 1 g (06/12/22 0855)     LOS: 11 days   Time spent:  Lynn Ito, MD [facility]   If 7PM-7AM, please contact night-coverage  06/12/2022, 12:21 PM

## 2022-06-12 NOTE — Progress Notes (Signed)
Patient deferred wound care for later in the day, education was provided regarding the risk for infection.

## 2022-06-12 NOTE — Progress Notes (Signed)
  Subjective:  S/P pre-patellar septic bursitis I&D 7/13 and 7/27.  Patient sleeping on right side in bed.  Easily awoken.  He denies any significant left knee pain.     Objective:   VITALS:   Vitals:   06/11/22 1158 06/11/22 2001 06/12/22 0444 06/12/22 0806  BP: (!) 107/58 120/73 (!) 109/56 104/61  Pulse: 76 88 90 86  Resp:  16 16 18   Temp:  98.4 F (36.9 C) 98.3 F (36.8 C) 98.2 F (36.8 C)  TempSrc:  Oral Oral Oral  SpO2: 98% 100% 97% 97%  Weight:      Height:        PHYSICAL EXAM: Right knee: Patient without any significant right knee effusion.  Painless knee range of motion.  No erythema.  Patient with serous drainage on his gauze bandage.  LABS  No results found for this or any previous visit (from the past 24 hour(s)).  Paracentesis  Result Date: 06/11/2022 INDICATION: Patient with history of cirrhosis and recurrent ascites request received for therapeutic paracentesis. EXAM: ULTRASOUND GUIDED  PARACENTESIS MEDICATIONS: Local 1% lidocaine only. COMPLICATIONS: None immediate. PROCEDURE: Informed written consent was obtained from the patient after a discussion of the risks, benefits and alternatives to treatment. A timeout was performed prior to the initiation of the procedure. Initial ultrasound scanning demonstrates a large amount of ascites within the right upper abdominal quadrant. The right upper abdomen was prepped and draped in the usual sterile fashion. 1% lidocaine was used for local anesthesia. Following this, a 19 gauge, 7-cm, Yueh catheter was introduced. An ultrasound image was saved for documentation purposes. The paracentesis was performed. The catheter was removed and a dressing was applied. The patient tolerated the procedure well without immediate post procedural complication. FINDINGS: A total of approximately 4.5 L of clear yellow fluid was removed. IMPRESSION: Successful ultrasound-guided paracentesis yielding 4.5 liters of peritoneal fluid. PLAN: The  patient has required >/=2 paracenteses in a 30 day period and a formal evaluation by the [facility] Interventional Radiology Portal Hypertension Clinic has been arranged. This exam was performed by [facility] PA-C, and was supervised and interpreted by Dr. Pattricia Boss. Electronically Signed   By: Juliette Alcide M.D.   On: 06/11/2022 13:39    Assessment/Plan:     Principal Problem:   AMS (altered mental status) Active Problems:   Alcoholic cirrhosis of liver with ascites (HCC)   Hypokalemia   Anemia   Polysubstance abuse (HCC)   Septic arthritis (HCC)   Thrombocytopenia (HCC)   Portal hypertension (HCC)   Bacteremia   Malnutrition of moderate degree  I recommended a small Prevena VAC dressing to keep the incision dry but the patient refused.  He states he is homeless and not want the [facility] unit around.  Continue IV antibiotics.  Patient may be discharged from an orthopedic standpoint once cleared medically.  I will not be in the hospital this weekend.  [facility] is covering for any issues.  If serous drainage increases patient will need to restart a VAC dressing for inpatient use.    Okey Dupre , MD 06/12/2022, 2:20 PM

## 2022-06-12 NOTE — Progress Notes (Signed)
Date of Admission:  06/01/2022     ID: Marco Spears is a 46 y.o. male  Principal Problem:   AMS (altered mental status) Active Problems:   Alcoholic cirrhosis of liver with ascites (HCC)   Hypokalemia   Anemia   Polysubstance abuse (HCC)   Septic arthritis (HCC)   Thrombocytopenia (HCC)   Portal hypertension (HCC)   Bacteremia   Malnutrition of moderate degree    Subjective: Pt is stable He refused prevana vac recommended by ortho   Medications:   feeding supplement  237 mL Oral TID BM   ferrous sulfate  325 mg Oral Q breakfast   folic acid  1 mg Oral Daily   furosemide  40 mg Oral BID   gabapentin  300 mg Oral TID   methocarbamol  750 mg Oral TID   multivitamin with minerals  1 tablet Oral Daily   nicotine  21 mg Transdermal Daily   nystatin  5 mL Oral QID   polyethylene glycol  17 g Oral Daily   senna-docusate  1 tablet Oral BID   sodium chloride flush  3 mL Intravenous Q12H   spironolactone  100 mg Oral Daily   thiamine (VITAMIN B1) injection  100 mg Intravenous Daily    Objective:   PHYSICAL EXAM:  Patient Vitals for the past 24 hrs:  BP Temp Temp src Pulse Resp SpO2  06/12/22 0806 104/61 98.2 F (36.8 C) Oral 86 18 97 %  06/12/22 0444 (!) 109/56 98.3 F (36.8 C) Oral 90 16 97 %  06/11/22 2001 120/73 98.4 F (36.9 C) Oral 88 16 100 %  06/11/22 1158 (!) 107/58 -- -- 76 -- 98 %    General:awake and alert Lungs: b/l air entry- Heart: s1s2 Abdomen: Soft,  less distended - rt side paracentesis dressing Extremities: rt knee wound vac has been removed- the sutures are coapted- some oozing because of edema legs Edema of the abdominal wall and lower leg  improving Lymph: Cervical, supraclavicular normal. Neurologic: Grossly non-focal  Lab Results    Latest Ref Rng & Units 06/09/2022   10:36 AM 06/07/2022    7:40 AM 06/05/2022    7:55 AM  CBC  WBC 4.0 - 10.5 K/uL 2.8  3.8  2.9   Hemoglobin 13.0 - 17.0 g/dL 8.9  8.5  7.9   Hematocrit 39.0 - 52.0 %  27.2  25.2  23.1   Platelets 150 - 400 K/uL 75  63  45        Latest Ref Rng & Units 06/09/2022   10:36 AM 06/07/2022    7:40 AM 06/05/2022    7:55 AM  CMP  Glucose 70 - 99 mg/dL 875  643  329   BUN 6 - 20 mg/dL 14  11  11    Creatinine 0.61 - 1.24 mg/dL  5.18  8.41   Sodium 135 - 145 mmol/L 134  136  134   Potassium 3.5 - 5.1 mmol/L 3.9  4.1  3.9   Chloride 98 - 111 mmol/L 103  106  108   CO2 22 - 32 mmol/L 26  26  23    Calcium 8.9 - 10.3 mg/dL 8.0  8.0  7.6   Total Protein 6.5 - 8.1 g/dL 5.1     Total Bilirubin 0.3 - 1.2 mg/dL 2.2     Alkaline Phos 38 - 126 U/L 142     AST 15 - 41 U/L 62     ALT 0 - 44  U/L 55         Microbiology: 06/01/22 BC- ESBL E.coli WC- Kleb oxytoca , acinetobacter Studies/Results: US Paracentesis  Result Date: 06/11/2022 INDICATION: Patient with history of cirrhosis and recurrent ascites request received for therapeutic paracentesis. EXAM: ULTRASOUND GUIDED  PARACENTESIS MEDICATIONS: Local 1% lidocaine only. COMPLICATIONS: None immediate. PROCEDURE: Informed written consent was obtained from the patient after a discussion of the risks, benefits and alternatives to treatment. A timeout was performed prior to the initiation of the procedure. Initial ultrasound scanning demonstrates a large amount of ascites within the right upper abdominal quadrant. The right upper abdomen was prepped and draped in the usual sterile fashion. 1% lidocaine was used for local anesthesia. Following this, a 19 gauge, 7-cm, Yueh catheter was introduced. An ultrasound image was saved for documentation purposes. The paracentesis was performed. The catheter was removed and a dressing was applied. The patient tolerated the procedure well without immediate post procedural complication. FINDINGS: A total of approximately 4.5 L of clear yellow fluid was removed. IMPRESSION: Successful ultrasound-guided paracentesis yielding 4.5 liters of peritoneal fluid. PLAN: The patient has required >/=2  paracenteses in a 30 day period and a formal evaluation by the [facility] Interventional Radiology Portal Hypertension Clinic has been arranged. This exam was performed by Pattricia Boss PA-C, and was supervised and interpreted by Dr. Juliette Alcide. Electronically Signed   By: Olive Bass M.D.   On: 06/11/2022 13:39     Assessment/Plan: ?ESBL ecoli bacteremia- on meropenem until 06/15/22 Rt prepatellar septic bursitis recurrent with different organisms each time- 1st GAS, 2nd tome MRSA and now multiple gram neg organisms His homelessness, drug use are all contributing s/p I/D   klebsiella acinetobacter and pseudomonas= all susceptible to meropenem . Would do    Recent strep pyogenes infection of rt prepatellar bursa, then had MRSA infection and now gram neg rod   Encephalopathy from SUD- resolved   Decompensated cirrhosis with ascites Repeat Paracentesis done Cell count much improved Culture neg so far Anasarca secondary to the above HE needs to follow up with [facility] as they know him ? TIPS   Spontaneous bacteria peritonitis- on meropenem    Anemia Thrombocytopenia Hyperbilirubinemia Leucopenia All of the above due to decompensated cirrhosis ? Crystal meth use   Discussed the management with patient and care team High risk of readmission due to homelessness and SUD  ID will follow peripherally this weekend- on call tem available by phone for urgent issues

## 2022-06-13 NOTE — Progress Notes (Signed)
PROGRESS NOTE    Marco Spears  FTD:[id number] DOB: 01/09/1976 DOA: 06/01/2022 PCP: Pcp, No    Brief Narrative:  46 y.o. male seen in ed brought by EMS for altered mental status where patient was picked up from a home where he had been on meth.  Patient's right knee incision had clear drainage per report patient was intermittently arousable but restless agitated and almost paranoid.  Patient reports pain over the right knee.  Patient gets startled with sound. Patient is altered and somnolent and not oriented and responds to when called but falls asleep immediately.   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  Patient had received Harvoni for his hep C and has had a DVT for which she has taken Eliquis.  Patient has past medical history of allergies to amoxicillin. Patient had diarrhea right knee recurrent septic prepatellar bursitis and has had I&D on 05/07/2022.  Patient had paracentesis on 05/22/2022 with 3.6 L of hazy yellow fluid.  Last paracentesis was on 05/26/2022 where he had 4.8 L removed. Patient is a large hematoma about 12 cm on his lower abdominal area more towards suprapubic area.   Infectious disease following.  GI signed off as of 8/9.  Home diuretics resumed. Mental status improved on 8/10.  Continues to endorse severe muscle spasms Status post ultrasound-guided paracentesis 8/11.  6 L fluid liberated Status post repeat ultrasound-guided paracentesis 8/14.  4.4 L fluid removed  8/16 had small bm yesterday, wants enema 8/17 threatening to leave AMA if his fluid restriction on his diet is not discontinued. Also wants paracentesis as he fels his abdomen more distended.  If he is not On fluid restriction is easier for him to come volume overloaded however he disagrees and wants that restriction to be discontinued  8/19 no overnight issues    Assessment & Plan:   Principal Problem:    AMS (altered mental status) Active Problems:   Alcoholic cirrhosis of liver with ascites (HCC)   Anemia   Septic arthritis (HCC)   Hypokalemia   Polysubstance abuse (HCC)   Thrombocytopenia (HCC)   Portal hypertension (HCC)   Bacteremia   Malnutrition of moderate degree  Acute metabolic encephalopathy Likely multifactorial.  Patient is a admitted methamphetamine user.  Also has infection which could be contributing.  Patient remains somewhat lethargic with tangential thought and mumbling.  CT head normal. 8/10: Mental status improved.  Appears to be back to baseline Plan: 8/19 MS at baseline  Avoid sedation       Septic arthritis right knee ESBL E. coli bacteremia Blood culture positive from 8/8.  Infectious disease on board.  Was previously getting vancomycin and Rocephin.  Changed to meropenem.  Orthopedics consulted.  Incisional wound VAC in place by [facility] team.  Application of knee immobilizer. Plan:  VAC d/c'd on 8/15, cleared for dc from orthopedics stand point. F/u ortho in 7-10 days. 8/17 based on orthopedics assessment yesterday patient continues to have serosanguineous drainage from his anterior knee incision.  Wound healing is compromised due to his multiple comorbidities.  Plan is to have his VAC dressing replaced or Prevena dressing may need to be used.  There is concern since he is homeless to be discharged on Previfem a dressing after discharge.  He is noncompliant with his antibiotics as outpatient. 8/18 Per Ortho patient may need Prevena or VAC dressing if he continues to have serosanguineous drainage from his surgical  site 8/19 continue IV meropenem until 8/21  Anemia Thrombocytopenia Portal hypertension Spontaneous bacterial peritonitis Hepatic cirrhosis No evidence of bleeding Currently no indication for PRBC or platelet transfusion Will monitor closely for bleed Continue meropenem as above as this will cover for SBP as well Continue home Lasix and  Aldactone 8/11: Repeat ultrasound-guided paracentesis ordered 8/11: Ultrasound-guided paracentesis.  6 L fluid removed 8/14: Ultrasound-guided paracentesis.  4.4 L fluid removed 8/17 status post IR therapeutic paracentesis with removal of 4.5 L of clear yellow fluid. 8/19 continue to monitor    Polysubstance use Counseled patient 8/19 avoid narcotics        DVT prophylaxis: SCD Code Status: Full Family Communication: None Disposition Plan: Status is: Inpatient Remains inpatient appropriate because: Right knee septic arthritis on IV antibiotics until 8/21, due to hx/o drug abuse  Level of care: Med-Surg  Consultants:  ID, ortho, IR  Procedures:  Paracentesis Knee arthrocentesis  Antimicrobials: Meropenem   Subjective: No new complaints.  No shortness of breath or nausea vomiting   Objective: Vitals:   06/12/22 1531 06/12/22 2012 06/13/22 0415 06/13/22 0800  BP: 114/73 115/66 105/60 112/68  Pulse: 99 84 84 75  Resp: 18 18 18 18   Temp: 98.5 F (36.9 C) (!) 97.5 F (36.4 C) 98.1 F (36.7 C) 98.4 F (36.9 C)  TempSrc: Oral Oral Oral Oral  SpO2: 99% 100% 98% 100%  Weight:      Height:       No intake or output data in the 24 hours ending 06/13/22 0849  Filed Weights   06/05/22 0500 06/06/22 0417 06/07/22 0500  Weight: 107.2 kg 110.7 kg 108.9 kg    Examination: Calm, NAD Cta no w/r Reg s1/s2 no gallop Soft benign +bs, mild distention No edema Aaoxox3  Mood and affect appropriate in current setting     Data Reviewed: I have personally reviewed following labs and imaging studies  CBC: Recent Labs  Lab 06/07/22 0740 06/09/22 1036  WBC 3.8* 2.8*  NEUTROABS 2.5 1.7  HGB 8.5* 8.9*  HCT 25.2* 27.2*  MCV 100.0 100.7*  PLT 63* 75*   Basic Metabolic Panel: Recent Labs  Lab 06/07/22 0740 06/09/22 1036  NA 136 134*  K 4.1 3.9  CL 106 103  CO2 26 26  GLUCOSE 115* 140*  BUN 11 14  CREATININE 0.62 0.59*  CALCIUM 8.0* 8.0*    GFR: Estimated Creatinine Clearance: 150.6 mL/min (A) (by C-G formula based on SCr of 0.59 mg/dL (L)). Liver Function Tests: Recent Labs  Lab 06/09/22 1036  AST 62*  ALT 55*  ALKPHOS 142*  BILITOT 2.2*  PROT 5.1*  ALBUMIN 2.5*   No results for input(s): "LIPASE", "AMYLASE" in the last 168 hours. No results for input(s): "AMMONIA" in the last 168 hours.  Coagulation Profile: No results for input(s): "INR", "PROTIME" in the last 168 hours.  Cardiac Enzymes: No results for input(s): "CKTOTAL", "CKMB", "CKMBINDEX", "TROPONINI" in the last 168 hours.  BNP (last 3 results) No results for input(s): "PROBNP" in the last 8760 hours. HbA1C: No results for input(s): "HGBA1C" in the last 72 hours. CBG: No results for input(s): "GLUCAP" in the last 168 hours.  Lipid Profile: No results for input(s): "CHOL", "HDL", "LDLCALC", "TRIG", "CHOLHDL", "LDLDIRECT" in the last 72 hours. Thyroid Function Tests: No results for input(s): "TSH", "T4TOTAL", "FREET4", "T3FREE", "THYROIDAB" in the last 72 hours.  Anemia Panel: No results for input(s): "VITAMINB12", "FOLATE", "FERRITIN", "TIBC", "IRON", "RETICCTPCT" in the last 72 hours. Sepsis Labs: No results  for input(s): "PROCALCITON", "LATICACIDVEN" in the last 168 hours.   Recent Results (from the past 240 hour(s))  Culture, blood (Routine X 2) w Reflex to ID Panel     Status: None   Collection Time: 06/05/22  4:14 AM   Specimen: BLOOD  Result Value Ref Range Status   Specimen Description BLOOD BLOOD RIGHT HAND  Final   Special Requests   Final    BOTTLES DRAWN AEROBIC AND ANAEROBIC Blood Culture adequate volume   Culture   Final    NO GROWTH 5 DAYS Performed at [facility], [address] [place], [place] [address]    Report Status 06/10/2022 FINAL  Final  Culture, blood (Routine X 2) w Reflex to ID Panel     Status: None   Collection Time: 06/05/22  4:14 AM   Specimen: BLOOD  Result Value Ref Range Status   Specimen  Description BLOOD BLOOD LEFT HAND  Final   Special Requests   Final    IN PEDIATRIC BOTTLE Blood Culture results may not be optimal due to an excessive volume of blood received in culture bottles   Culture   Final    NO GROWTH 5 DAYS Performed at [facility], [address], [place], [place] [address]    Report Status 06/10/2022 FINAL  Final  Body fluid culture w Gram Stain     Status: None   Collection Time: 06/08/22 11:39 AM   Specimen: PATH Cytology Peritoneal fluid  Result Value Ref Range Status   Specimen Description   Final    PERITONEAL Performed at [facility], [address], [place], [place] [address]    Special Requests   Final    NONE Performed at [facility], [address] [place], [place] [address]    Gram Stain   Final    WBC PRESENT,BOTH PMN AND MONONUCLEAR NO ORGANISMS SEEN CYTOSPIN SMEAR    Culture   Final    NO GROWTH 3 DAYS Performed at [facility], [address] [place], [place] [address]    Report Status 06/11/2022 FINAL  Final         Radiology Studies: US Paracentesis  Result Date: 06/11/2022 INDICATION: Patient with history of cirrhosis and recurrent ascites request received for therapeutic paracentesis. EXAM: ULTRASOUND GUIDED  PARACENTESIS MEDICATIONS: Local 1% lidocaine only. COMPLICATIONS: None immediate. PROCEDURE: Informed written consent was obtained from the patient after a discussion of the risks, benefits and alternatives to treatment. A timeout was performed prior to the initiation of the procedure. Initial ultrasound scanning demonstrates a large amount of ascites within the right upper abdominal quadrant. The right upper abdomen was prepped and draped in the usual sterile fashion. 1% lidocaine was used for local anesthesia. Following this, a 19 gauge, 7-cm, Yueh catheter was introduced. An ultrasound image was saved for documentation purposes. The paracentesis was performed. The catheter was  removed and a dressing was applied. The patient tolerated the procedure well without immediate post procedural complication. FINDINGS: A total of approximately 4.5 L of clear yellow fluid was removed. IMPRESSION: Successful ultrasound-guided paracentesis yielding 4.5 liters of peritoneal fluid. PLAN: The patient has required >/=2 paracenteses in a 30 day period and a formal evaluation by the [facility] has been arranged. This exam was performed by Pattricia Boss PA-C, and was supervised and interpreted by Dr. Juliette Alcide. Electronically Signed   By: Olive Bass M.D.   On: 06/11/2022 13:39  Scheduled Meds:  feeding supplement  237 mL Oral TID BM   ferrous sulfate  325 mg Oral Q breakfast   folic acid  1 mg Oral Daily   furosemide  40 mg Oral BID   gabapentin  300 mg Oral TID   methocarbamol  750 mg Oral TID   multivitamin with minerals  1 tablet Oral Daily   nicotine  21 mg Transdermal Daily   nystatin  5 mL Oral QID   polyethylene glycol  17 g Oral Daily   senna-docusate  1 tablet Oral BID   sodium chloride flush  3 mL Intravenous Q12H   spironolactone  100 mg Oral Daily   thiamine (VITAMIN B1) injection  100 mg Intravenous Daily   Continuous Infusions:  sodium chloride Stopped (06/09/22 0255)   albumin human     meropenem (MERREM) IV Stopped (06/12/22 1731)     LOS: 12 days   Time spent:  Lynn Ito, MD [facility]   If 7PM-7AM, please contact night-coverage  06/13/2022, 8:49 AM

## 2022-06-14 MED ORDER — THIAMINE HCL 100 MG PO TABS
100.0000 mg | ORAL_TABLET | Freq: Every day | ORAL | Status: DC
  Administered 2022-06-15: 100 mg via ORAL
  Filled 2022-06-14: qty 1

## 2022-06-14 NOTE — Plan of Care (Signed)
  Problem: Health Behavior/Discharge Planning: Goal: Ability to manage health-related needs will improve Outcome: Not Progressing   Problem: Clinical Measurements: Goal: Ability to maintain clinical measurements within normal limits will improve Outcome: Not Progressing   

## 2022-06-14 NOTE — Progress Notes (Signed)
PROGRESS NOTE    Marco Spears  [id number] DOB: 10-Aug-1976 DOA: 06/01/2022 PCP: Pcp, No    Brief Narrative:  46 y.o. male seen in ed brought by EMS for altered mental status where patient was picked up from a home where he had been on meth.  Patient's right knee incision had clear drainage per report patient was intermittently arousable but restless agitated and almost paranoid.  Patient reports pain over the right knee.  Patient gets startled with sound. Patient is altered and somnolent and not oriented and responds to when called but falls asleep immediately.   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  Patient had received Harvoni for his hep C and has had a DVT for which she has taken Eliquis.  Patient has past medical history of allergies to amoxicillin. Patient had diarrhea right knee recurrent septic prepatellar bursitis and has had I&D on 05/07/2022.  Patient had paracentesis on 05/22/2022 with 3.6 L of hazy yellow fluid.  Last paracentesis was on 05/26/2022 where he had 4.8 L removed. Patient is a large hematoma about 12 cm on his lower abdominal area more towards suprapubic area.   Infectious disease following.  GI signed off as of 8/9.  Home diuretics resumed. Mental status improved on 8/10.  Continues to endorse severe muscle spasms Status post ultrasound-guided paracentesis 8/11.  6 L fluid liberated Status post repeat ultrasound-guided paracentesis 8/14.  4.4 L fluid removed  8/16 had small bm yesterday, wants enema 8/17 threatening to leave AMA if his fluid restriction on his diet is not discontinued. Also wants paracentesis as he fels his abdomen more distended.  If he is not On fluid restriction is easier for him to come volume overloaded however he disagrees and wants that restriction to be discontinued  8/20 no overnight issues   Assessment & Plan:   Principal Problem:    AMS (altered mental status) Active Problems:   Alcoholic cirrhosis of liver with ascites (HCC)   Anemia   Septic arthritis (HCC)   Hypokalemia   Polysubstance abuse (HCC)   Thrombocytopenia (HCC)   Portal hypertension (HCC)   Bacteremia   Malnutrition of moderate degree  Acute metabolic encephalopathy Likely multifactorial.  Patient is a admitted methamphetamine user.  Also has infection which could be contributing.  Patient remains somewhat lethargic with tangential thought and mumbling.  CT head normal. 8/10: Mental status improved.  Appears to be back to baseline Plan: 8/20 MS at baseline . avoid sedation         Septic arthritis right knee ESBL E. coli bacteremia Blood culture positive from 8/8.  Infectious disease on board.  Was previously getting vancomycin and Rocephin.  Changed to meropenem.  Orthopedics consulted.  Incisional wound VAC in place by [facility] team.  Application of knee immobilizer  VAC d/c'd on 8/15, cleared for dc from orthopedics stand point. F/u ortho in 7-10 days. 8/17 based on orthopedics assessment yesterday patient continues to have serosanguineous drainage from his anterior knee incision.  Wound healing is compromised due to his multiple comorbidities.  Plan is to have his VAC dressing replaced or Prevena dressing may need to be used.  There is concern since he is homeless to be discharged on Previfem a dressing after discharge.  He is noncompliant with his antibiotics as outpatient. 8/18 Per Ortho patient may need Prevena or VAC dressing if he continues to have serosanguineous drainage from his surgical  site 8/20 continue IV meropenem until 8/21 is the last day    Anemia Thrombocytopenia Portal hypertension Spontaneous bacterial peritonitis Hepatic cirrhosis No evidence of bleeding Currently no indication for PRBC or platelet transfusion Will monitor closely for bleed Continue meropenem as above as this will cover for SBP as well Continue home  Lasix and Aldactone 8/11: Repeat ultrasound-guided paracentesis ordered 8/11: Ultrasound-guided paracentesis.  6 L fluid removed 8/14: Ultrasound-guided paracentesis.  4.4 L fluid removed 8/17 status post IR therapeutic paracentesis with removal of 4.5 L of clear yellow fluid. 8/20 continue to monitor       Polysubstance use Counseled patient 8/20 avoid narcotics          DVT prophylaxis: SCD Code Status: Full Family Communication: None Disposition Plan: Status is: Inpatient Remains inpatient appropriate because: Right knee septic arthritis on IV antibiotics until 8/21, due to hx/o drug abuse  Level of care: Med-Surg  Consultants:  ID, ortho, IR  Procedures:  Paracentesis Knee arthrocentesis  Antimicrobials: Meropenem   Subjective: No complaints.  No shortness of breath or chest pain   Objective: Vitals:   06/13/22 1632 06/13/22 2012 06/14/22 0515 06/14/22 0920  BP: 118/64 100/63 (!) 104/58 123/70  Pulse: 78 80 80 83  Resp: 16 16 16 18   Temp: 98.1 F (36.7 C) 98.1 F (36.7 C) 98.6 F (37 C) 98.2 F (36.8 C)  TempSrc: Oral Oral Oral Oral  SpO2: 100% 99% 99% 98%  Weight:      Height:        Intake/Output Summary (Last 24 hours) at 06/14/2022 1149 Last data filed at 06/14/2022 1000 Gross per 24 hour  Intake 912 ml  Output 0 ml  Net 912 ml    Filed Weights   06/05/22 0500 06/06/22 0417 06/07/22 0500  Weight: 107.2 kg 110.7 kg 108.9 kg    Examination: Calm, NAD Cta no w/r Reg s1/s2 no gallop Soft benign +bs No edema Grossly intact Mood and affect appropriate in current setting     Data Reviewed: I have personally reviewed following labs and imaging studies  CBC: Recent Labs  Lab 06/09/22 1036  WBC 2.8*  NEUTROABS 1.7  HGB 8.9*  HCT 27.2*  MCV 100.7*  PLT 75*   Basic Metabolic Panel: Recent Labs  Lab 06/09/22 1036  NA 134*  K 3.9  CL 103  CO2 26  GLUCOSE 140*  BUN 14  CREATININE 0.59*  CALCIUM 8.0*    GFR: Estimated Creatinine Clearance: 150.6 mL/min (A) (by C-G formula based on SCr of 0.59 mg/dL (L)). Liver Function Tests: Recent Labs  Lab 06/09/22 1036  AST 62*  ALT 55*  ALKPHOS 142*  BILITOT 2.2*  PROT 5.1*  ALBUMIN 2.5*   No results for input(s): "LIPASE", "AMYLASE" in the last 168 hours. No results for input(s): "AMMONIA" in the last 168 hours.  Coagulation Profile: No results for input(s): "INR", "PROTIME" in the last 168 hours.  Cardiac Enzymes: No results for input(s): "CKTOTAL", "CKMB", "CKMBINDEX", "TROPONINI" in the last 168 hours.  BNP (last 3 results) No results for input(s): "PROBNP" in the last 8760 hours. HbA1C: No results for input(s): "HGBA1C" in the last 72 hours. CBG: No results for input(s): "GLUCAP" in the last 168 hours.  Lipid Profile: No results for input(s): "CHOL", "HDL", "LDLCALC", "TRIG", "CHOLHDL", "LDLDIRECT" in the last 72 hours. Thyroid Function Tests: No results for input(s): "TSH", "T4TOTAL", "FREET4", "T3FREE", "THYROIDAB" in the last 72 hours.  Anemia Panel: No results for input(s): "VITAMINB12", "FOLATE", "FERRITIN", "TIBC", "  IRON", "RETICCTPCT" in the last 72 hours. Sepsis Labs: No results for input(s): "PROCALCITON", "LATICACIDVEN" in the last 168 hours.   Recent Results (from the past 240 hour(s))  Culture, blood (Routine X 2) w Reflex to ID Panel     Status: None   Collection Time: 06/05/22  4:14 AM   Specimen: BLOOD  Result Value Ref Range Status   Specimen Description BLOOD BLOOD RIGHT HAND  Final   Special Requests   Final    BOTTLES DRAWN AEROBIC AND ANAEROBIC Blood Culture adequate volume   Culture   Final    NO GROWTH 5 DAYS Performed at [facility], [address] [place], [place] [address]    Report Status 06/10/2022 FINAL  Final  Culture, blood (Routine X 2) w Reflex to ID Panel     Status: None   Collection Time: 06/05/22  4:14 AM   Specimen: BLOOD  Result Value Ref Range Status   Specimen  Description BLOOD BLOOD LEFT HAND  Final   Special Requests   Final    IN PEDIATRIC BOTTLE Blood Culture results may not be optimal due to an excessive volume of blood received in culture bottles   Culture   Final    NO GROWTH 5 DAYS Performed at [facility], [address], [place], [place] [address]    Report Status 06/10/2022 FINAL  Final  Body fluid culture w Gram Stain     Status: None   Collection Time: 06/08/22 11:39 AM   Specimen: PATH Cytology Peritoneal fluid  Result Value Ref Range Status   Specimen Description   Final    PERITONEAL Performed at [facility], [address] [place], [place] [address]    Special Requests   Final    NONE Performed at [facility], [address] [place], [place] [address]    Gram Stain   Final    WBC PRESENT,BOTH PMN AND MONONUCLEAR NO ORGANISMS SEEN CYTOSPIN SMEAR    Culture   Final    NO GROWTH 3 DAYS Performed at [facility], [address], [place], [place] [address]    Report Status 06/11/2022 FINAL  Final         Radiology Studies: No results found.      Scheduled Meds:  feeding supplement  237 mL Oral TID BM   ferrous sulfate  325 mg Oral Q breakfast   folic acid  1 mg Oral Daily   furosemide  40 mg Oral BID   gabapentin  300 mg Oral TID   methocarbamol  750 mg Oral TID   multivitamin with minerals  1 tablet Oral Daily   nicotine  21 mg Transdermal Daily   nystatin  5 mL Oral QID   polyethylene glycol  17 g Oral Daily   senna-docusate  1 tablet Oral BID   sodium chloride flush  3 mL Intravenous Q12H   spironolactone  100 mg Oral Daily   [START ON 06/15/2022] thiamine  100 mg Oral Daily   Continuous Infusions:  sodium chloride Stopped (06/09/22 0255)   albumin human     meropenem (MERREM) IV 1 g (06/14/22 0834)     LOS: 13 days   Time spent:  Lynn Ito, MD [facility]   If 7PM-7AM, please contact night-coverage

## 2022-06-14 NOTE — Plan of Care (Signed)

## 2022-06-14 NOTE — TOC Progression Note (Signed)
Transition of Care [facility]) - Progression Note    Patient Details  Name: Marco Spears MRN: [id number] Date of Birth: November 02, 1975  Transition of Care [facility]) CM/SW Contact  Chapman Fitch, RN Phone Number: 06/14/2022, 11:54 AM  Clinical Narrative:      Patient states that he will be discharged tomorrow Patient requesting a cab voucher to his brothers boarding house [address] [place], [place] Patient would like to request his medical record. TOC to follow up with Medical records in the morning about patient putting in a request  Patient states he is not interested in staying with his ex girlfriend at discharge.  Patient states that he will stay the 1 night a week with his brother, [place]  Patient state that his grandmother lives in [place] has offered for him to stay with her, but that he wishes to stay in [place].  Patient states that his grandmother is mailing his birth certificate to his ex girlfriends house that way get and work on getting disability.   Cab voucher placed on chart    Expected Discharge Plan: Homeless Shelter Barriers to Discharge: Continued Medical Work up  Expected Discharge Plan and Services Expected Discharge Plan: Homeless Shelter                                               Social Determinants of Health (SDOH) Interventions    Readmission Risk Interventions    05/06/2022    2:26 PM  Readmission Risk Prevention Plan  Transportation Screening Complete  Medication Review [employer]) Complete

## 2022-06-15 ENCOUNTER — Inpatient Hospital Stay: Payer: Self-pay

## 2022-06-15 ENCOUNTER — Other Ambulatory Visit: Payer: Self-pay

## 2022-06-15 DIAGNOSIS — M7110 Other infective bursitis, unspecified site: Secondary | ICD-10-CM

## 2022-06-15 DIAGNOSIS — B9689 Other specified bacterial agents as the cause of diseases classified elsewhere: Secondary | ICD-10-CM

## 2022-06-15 LAB — CBC
HCT: 28 % — ABNORMAL LOW (ref 39.0–52.0)
Hemoglobin: 9.4 g/dL — ABNORMAL LOW (ref 13.0–17.0)
MCH: 33.7 pg (ref 26.0–34.0)
MCHC: 33.6 g/dL (ref 30.0–36.0)
MCV: 100.4 fL — ABNORMAL HIGH (ref 80.0–100.0)
Platelets: 96 10*3/uL — ABNORMAL LOW (ref 150–400)
RBC: 2.79 MIL/uL — ABNORMAL LOW (ref 4.22–5.81)
RDW: 17.5 % — ABNORMAL HIGH (ref 11.5–15.5)
WBC: 3.9 10*3/uL — ABNORMAL LOW (ref 4.0–10.5)
nRBC: 0 % (ref 0.0–0.2)

## 2022-06-15 LAB — BASIC METABOLIC PANEL
Anion gap: 4 — ABNORMAL LOW (ref 5–15)
BUN: 17 mg/dL (ref 6–20)
CO2: 26 mmol/L (ref 22–32)
Calcium: 8 mg/dL — ABNORMAL LOW (ref 8.9–10.3)
Chloride: 106 mmol/L (ref 98–111)
Creatinine, Ser: 0.53 mg/dL — ABNORMAL LOW (ref 0.61–1.24)
GFR, Estimated: >60 mL/min (ref >60)
Glucose, Bld: 99 mg/dL (ref 70–99)
Potassium: 4 mmol/L (ref 3.5–5.1)
Sodium: 136 mmol/L (ref 135–145)

## 2022-06-15 MED ORDER — HYDROCORTISONE 0.5 % EX CREA: TOPICAL_CREAM | Freq: Two times a day (BID) | CUTANEOUS | Status: DC | PRN

## 2022-06-15 MED ORDER — CIPROFLOXACIN HCL 500 MG PO TABS
500.0000 mg | ORAL_TABLET | Freq: Two times a day (BID) | ORAL | 0 refills | Status: DC
  Filled 2022-06-15: qty 28, 14d supply, fill #0

## 2022-06-15 MED ORDER — THIAMINE HCL 100 MG PO TABS
100.0000 mg | ORAL_TABLET | Freq: Every day | ORAL | 0 refills | Status: AC
  Filled 2022-06-15: qty 10, 10d supply, fill #0

## 2022-06-15 MED ORDER — CIPROFLOXACIN HCL 500 MG PO TABS
500.0000 mg | ORAL_TABLET | Freq: Two times a day (BID) | ORAL | Status: DC
Start: 2022-06-16 — End: 2022-06-16

## 2022-06-15 NOTE — TOC Transition Note (Signed)
Transition of Care [facility]) - CM/SW Discharge Note   Patient Details  Name: Marco Spears MRN: [id number] Date of Birth: 02-03-1976  Transition of Care [facility]) CM/SW Contact:  Chapman Fitch, RN Phone Number: 06/15/2022, 2:16 PM   Clinical Narrative:    Medical release form completed by patient and faxed to HIM Discharge medications to be filled by [facility] outpatient pharmacy. Pharmacy to deliver to bedside.  After last dose of IV antibiotics administered and discharge medications delivered bedside RN to call cab.  Voucher on chart       Barriers to Discharge: Continued Medical Work up   Patient Goals and CMS Choice        Discharge Placement                       Discharge Plan and Services                                     Social Determinants of Health (SDOH) Interventions     Readmission Risk Interventions    05/06/2022    2:26 PM  Readmission Risk Prevention Plan  Transportation Screening Complete  Medication Review Oceanographer) Complete

## 2022-06-15 NOTE — Discharge Summary (Signed)
Marco Spears [id number] DOB: 1976/04/12 DOA: 06/01/2022  PCP: Merryl Hacker, No  Admit date: 06/01/2022 Discharge date: 06/15/2022  Admitted From: home Disposition:  home  Recommendations for Outpatient Follow-up:  Follow up with PCP in 1 week Please obtain BMP/CBC in one week Please follow up on the following pending results:  Home Health:***    Discharge Condition:Stable CODE STATUS:***  Diet recommendation: Heart Healthy / Carb Modified / Regular / Dysphagia  Brief/Interim Summary:   Discharge Diagnoses:  Principal Problem:   AMS (altered mental status) Active Problems:   Alcoholic cirrhosis of liver with ascites ([place])   Anemia   Septic arthritis ([place])   Hypokalemia   Polysubstance abuse ([place])   Thrombocytopenia ([place])   Portal hypertension ([place])   Bacteremia   Malnutrition of moderate degree    Discharge Instructions   Allergies as of 06/15/2022       Reactions   Amoxicillin Rash   TOLERATED CEFAZOLIN PRIOR States it makes his skin turn red        Medication List     STOP taking these medications    linezolid 600 MG tablet Commonly known as: Zyvox   nystatin 100000 UNIT/ML suspension Commonly known as: MYCOSTATIN       TAKE these medications    B-complex with vitamin C tablet Take 1 tablet by mouth daily.   ciprofloxacin 500 MG tablet Commonly known as: CIPRO Take 1 tablet (500 mg total) by mouth 2 (two) times daily for 14 days. Start taking on: June 16, 2022   FeroSul 325 (65 FE) MG tablet Generic drug: ferrous sulfate Take 1 tablet (325 mg total) by mouth daily with breakfast.   folic acid 1 MG tablet Commonly known as: FOLVITE Take 1 tablet (1 mg total) by mouth daily.   furosemide 40 MG tablet Commonly known as: LASIX Take 1 tablet (40 mg total) by mouth 2 (two) times daily.   gabapentin 400 MG capsule Commonly known as: NEURONTIN Take 1 capsule (400 mg total) by mouth 3 (three) times daily for 21 days.   multivitamin with  minerals Tabs tablet Take 1 tablet by mouth daily.   spironolactone 50 MG tablet Commonly known as: ALDACTONE Take 2 tablets (100 mg total) by mouth daily.   thiamine 100 MG tablet Commonly known as: VITAMIN B1 Take 1 tablet (100 mg total) by mouth daily. Start taking on: June 16, 2022        Follow-up Information     Thornton Park, MD Follow up.   Specialty: Orthopedic Surgery Why: for suture removal and wound check Contact information: [place] [address] [phone]                Allergies  Allergen Reactions   Amoxicillin Rash    TOLERATED CEFAZOLIN PRIOR States it makes his skin turn red    Consultations:    Procedures/Studies: US Paracentesis  Result Date: 06/11/2022 INDICATION: Patient with history of cirrhosis and recurrent ascites request received for therapeutic paracentesis. EXAM: ULTRASOUND GUIDED  PARACENTESIS MEDICATIONS: Local 1% lidocaine only. COMPLICATIONS: None immediate. PROCEDURE: Informed written consent was obtained from the patient after a discussion of the risks, benefits and alternatives to treatment. A timeout was performed prior to the initiation of the procedure. Initial ultrasound scanning demonstrates a large amount of ascites within the right upper abdominal quadrant. The right upper abdomen was prepped and draped in the usual sterile fashion. 1% lidocaine was used for local anesthesia. Following this, a 19 gauge, 7-cm,  Yueh catheter was introduced. An ultrasound image was saved for documentation purposes. The paracentesis was performed. The catheter was removed and a dressing was applied. The patient tolerated the procedure well without immediate post procedural complication. FINDINGS: A total of approximately 4.5 L of clear yellow fluid was removed. IMPRESSION: Successful ultrasound-guided paracentesis yielding 4.5 liters of peritoneal fluid. PLAN: The patient has required >/=2 paracenteses in a 30 day  period and a formal evaluation by the [facility] has been arranged. This exam was performed by Tsosie Billing PA-C, and was supervised and interpreted by Dr. Denna Haggard. Electronically Signed   By: Albin Felling M.D.   On: 06/11/2022 13:39   US Paracentesis  Result Date: 06/08/2022 INDICATION: Patient with history of alcoholic cirrhosis with recurrent ascites. Request is for therapeutic and diagnostic paracentesis EXAM: ULTRASOUND GUIDED THERAPEUTIC AND DIAGNOSTIC PARACENTESIS MEDICATIONS: Lidocaine 1% 10 mL COMPLICATIONS: None immediate. PROCEDURE: Informed written consent was obtained from the patient after a discussion of the risks, benefits and alternatives to treatment. A timeout was performed prior to the initiation of the procedure. Initial ultrasound scanning demonstrates a moderate amount of ascites within the right lower abdominal quadrant. The right lower abdomen was prepped and draped in the usual sterile fashion. 1% lidocaine was used for local anesthesia. Following this, a 19 gauge, 7-cm, Yueh catheter was introduced. An ultrasound image was saved for documentation purposes. The paracentesis was performed. The catheter was removed and a dressing was applied. The patient tolerated the procedure well without immediate post procedural complication. FINDINGS: A total of approximately 4.4 L of straw-colored fluid was removed. Samples were sent to the laboratory as requested by the clinical team. IMPRESSION: Successful ultrasound-guided therapeutic and diagnostic paracentesis yielding 4.4 liters of peritoneal fluid. Read by: Rushie Nyhan, NP PLAN: The patient has required >/=2 paracenteses in a 30 day period and a formal evaluation by the [facility] has been arranged. Electronically Signed   By: Ruthann Cancer M.D.   On: 06/08/2022 12:59   US Paracentesis  Result Date: 06/08/2022 INDICATION:  Patient with history of ETOH cirrhosis, recurrent ascites. Request to IR for therapeutic paracentesis. EXAM: ULTRASOUND GUIDED THERAPEUTIC PARACENTESIS MEDICATIONS: 7 mL 1% lidocaine COMPLICATIONS: None immediate. PROCEDURE: Informed written consent was obtained from the patient after a discussion of the risks, benefits and alternatives to treatment. A timeout was performed prior to the initiation of the procedure. Initial ultrasound scanning demonstrates a large amount of ascites within the left lower abdominal quadrant. The left lower abdomen was prepped and draped in the usual sterile fashion. 1% lidocaine was used for local anesthesia. Following this, a 19 gauge, 7-cm, Yueh catheter was introduced. An ultrasound image was saved for documentation purposes. The paracentesis was performed. The catheter was removed and a dressing was applied. The patient tolerated the procedure well without immediate post procedural complication. Patient received post-procedure intravenous albumin; see nursing notes for details. FINDINGS: A total of approximately 6.0 L of clear yellow fluid was removed. IMPRESSION: Successful ultrasound-guided paracentesis yielding 6.0 liters of peritoneal fluid. PLAN: The patient has required >/=2 paracenteses in a 30 day period and a formal evaluation by the [facility] has been arranged. Electronically Signed   By: Albin Felling M.D.   On: 06/08/2022 07:29   US Paracentesis  Result Date: 06/02/2022 INDICATION: Patient with history of polysubstance abuse, alcoholic cirrhosis and HCV infection. Patient referred to IR for diagnostic and therapeutic paracentesis for recurrent ascites. EXAM:  ULTRASOUND GUIDED DIAGNOSTIC AND THERAPEUTIC RIGHT LOWER QUADRANT PARACENTESIS MEDICATIONS: 10 mL 1 % lidocaine COMPLICATIONS: None immediate. PROCEDURE: Informed written consent was obtained from the patient after a discussion of the risks, benefits and  alternatives to treatment. A timeout was performed prior to the initiation of the procedure. Initial ultrasound scanning demonstrates a moderate amount of ascites within the right lower abdominal quadrant. The right lower abdomen was prepped and draped in the usual sterile fashion. 1% lidocaine was used for local anesthesia. Following this, a 6 Fr Safe-T-Centesis catheter was introduced. An ultrasound image was saved for documentation purposes. The paracentesis was performed. The catheter was removed and a dressing was applied. The patient tolerated the procedure well without immediate post procedural complication. FINDINGS: A total of approximately 3.2 L of clear, yellow fluid was removed. Samples were sent to the laboratory as requested by the clinical team. IMPRESSION: Successful ultrasound-guided paracentesis yielding 3.2 liters of peritoneal fluid. Read by: Narda Rutherford, AGNP-BC Electronically Signed   By: Albin Felling M.D.   On: 06/02/2022 16:22   US Abdomen Complete  Result Date: 06/02/2022 CLINICAL DATA:  Cirrhosis, abdominal pain EXAM: ABDOMEN ULTRASOUND COMPLETE COMPARISON:  None Available. FINDINGS: Gallbladder: Sludge is present. No gallstones or wall thickening visualized. No sonographic Murphy sign noted by sonographer. Common bile duct: Diameter: 3 mm, normal Liver: No focal lesion identified. Increased parenchymal echogenicity. Nodular contour. Portal vein is patent on color Doppler imaging with normal direction of blood flow towards the liver. IVC: No abnormality visualized. Pancreas: Not visualized. Spleen: Enlarged. Right Kidney: Length: 12.9 cm. Echogenicity within normal limits. No mass or hydronephrosis visualized. Left Kidney: Length: 12.0 cm. Echogenicity within normal limits. No mass or hydronephrosis visualized. Abdominal aorta: No aneurysm visualized. Other findings: Moderate ascites. IMPRESSION: Cirrhosis with splenomegaly and moderate ascites. Gallbladder sludge. Electronically  Signed   By: Macy Mis M.D.   On: 06/02/2022 09:29   MR KNEE RIGHT WO CONTRAST  Result Date: 06/01/2022 CLINICAL DATA:  Osteomyelitis suspected, knee, xray done EXAM: MRI OF THE RIGHT KNEE WITHOUT CONTRAST TECHNIQUE: Multiplanar, multisequence MR imaging of the knee was performed. No intravenous contrast was administered. COMPARISON:  Right knee MRI 05/21/2022 FINDINGS: MENISCI Medial: Undersurface tear of the posterior horn and body of the medial meniscus, with mild protrusion of meniscus tissue into the medial inferior gutter, similar to prior. Lateral: Intact lateral meniscus. LIGAMENTS Cruciates: ACL and PCL are intact. Collaterals: Medial collateral ligament is intact. Lateral collateral ligament complex is intact. CARTILAGE Patellofemoral:  Mild chondrosis.  No focal chondral defect. Medial:  Mild chondrosis.  No focal chondral defect Lateral:  Mild chondrosis.  No focal chondral defect. JOINT: Small joint effusion. POPLITEAL FOSSA: MinisculeBaker's cyst. EXTENSOR MECHANISM: Intact quadriceps tendon. Intact patellar tendon. BONES: No evidence of acute fracture. Decreased marrow edema within the patella. Preserved T1 marrow signal throughout. Other: Diffuse, circumferential skin thickening and soft tissue swelling along the knee. There is a subcutaneous fluid collection in the prepatellar soft tissues, measuring 4.3 x 1.0 x 3.4 cm (volume = 7.7 cm^3), previously measuring 4.0 x 1.0 x 3.7 cm (volume = 7.7 cm^3), not significantly changed. There appears to be a communication with the skin surface anteriorly (axial PD image 21). IMPRESSION: Diffuse skin thickening and soft tissue swelling along the knee, similar prior, compatible with cellulitis. No significant change in size of the prepatellar soft tissue fluid collection, measuring 4.3 x 1.0 x 3.4 cm, with communication with the skin surface anteriorly, correlate with drainage. Unchanged small joint effusion, likely  reactive. Decreased marrow edema in  the patella with preserved T1 marrow signal, suggesting resolving contusion or reactive marrow change. No evidence of osteomyelitis or convincing findings to suggest septic arthritis. Unchanged mild tricompartment osteoarthritis and undersurface tear of the medial meniscus. Electronically Signed   By: Caprice Renshaw M.D.   On: 06/01/2022 15:17   CT Head Wo Contrast  Result Date: 06/01/2022 CLINICAL DATA:  Mental status change, unknown cause EXAM: CT HEAD WITHOUT CONTRAST TECHNIQUE: Contiguous axial images were obtained from the base of the skull through the vertex without intravenous contrast. RADIATION DOSE REDUCTION: This exam was performed according to the departmental dose-optimization program which includes automated exposure control, adjustment of the mA and/or kV according to patient size and/or use of iterative reconstruction technique. COMPARISON:  04/20/2022 FINDINGS: Brain: There is no acute intracranial hemorrhage, mass effect, or edema. No acute appearing loss of gray-white differentiation. Left anteroinferior frontal encephalomalacia and anterior left temporal encephalomalacia again noted. There is no extra-axial fluid collection. Ventricles and sulci are within normal limits in size and configuration. Vascular: No hyperdense vessel or unexpected calcification. Skull: Calvarium is unremarkable. Sinuses/Orbits: No acute finding. Other: None. IMPRESSION: No acute intracranial abnormality. Stable chronic findings detailed above. Electronically Signed   By: Guadlupe Spanish M.D.   On: 06/01/2022 13:57   DG Chest Port 1 View  Result Date: 06/01/2022 CLINICAL DATA:  Questionable sepsis. EXAM: PORTABLE CHEST 1 VIEW COMPARISON:  None Available. FINDINGS: Trachea is midline. Heart size is accentuated by AP technique and low lung volumes. Mild central pulmonary vascular congestion. No airspace consolidation or pleural fluid. IMPRESSION: Central pulmonary vascular congestion may be due in part to low lung  volumes. Difficult to exclude impending edema. Electronically Signed   By: Leanna Battles M.D.   On: 06/01/2022 13:12   US Paracentesis  Result Date: 05/26/2022 INDICATION: History of cirrhosis with recurrent ascites. Request for therapeutic paracentesis. EXAM: ULTRASOUND GUIDED RIGHT LOWER QUADRANT PARACENTESIS MEDICATIONS: 1% plain lidocaine, 5 mL COMPLICATIONS: None immediate. PROCEDURE: Informed written consent was obtained from the patient after a discussion of the risks, benefits and alternatives to treatment. A timeout was performed prior to the initiation of the procedure. Initial ultrasound scanning demonstrates a large amount of ascites within the right lower abdominal quadrant. The right lower abdomen was prepped and draped in the usual sterile fashion. 1% lidocaine was used for local anesthesia. Following this, a 19 gauge, 7-cm, Yueh catheter was introduced. An ultrasound image was saved for documentation purposes. The paracentesis was performed. The catheter was removed and a dressing was applied. The patient tolerated the procedure well without immediate post procedural complication. FINDINGS: A total of approximately 4.8 L of slightly hazy yellow fluid was removed. IMPRESSION: Successful ultrasound-guided paracentesis yielding 4.8 liters of peritoneal fluid. Read by: Brayton El PA-C Electronically Signed   By: Irish Lack M.D.   On: 05/26/2022 16:16   US Paracentesis  Result Date: 05/22/2022 INDICATION: Patient with history of polysubstance abuse, alcoholic cirrhosis and HCV infection. Patient referred to IR for therapeutic paracentesis for ascites. EXAM: ULTRASOUND GUIDED THERAPEUTIC LEFT UPPER QUADRANT PARACENTESIS MEDICATIONS: 10 mL 1 % lidocaine COMPLICATIONS: None immediate. PROCEDURE: Informed written consent was obtained from the patient after a discussion of the risks, benefits and alternatives to treatment. A timeout was performed prior to the initiation of the procedure.  Initial ultrasound scanning demonstrates a moderate amount of ascites within the left upper abdominal quadrant. The left upper abdomen was prepped and draped in the usual sterile fashion. 1%  lidocaine was used for local anesthesia. Following this, a 6 Fr Safe-T-Centesis catheter was introduced. An ultrasound image was saved for documentation purposes. The paracentesis was performed. The catheter was removed and a dressing was applied. The patient tolerated the procedure well without immediate post procedural complication. Patient received post-procedure intravenous albumin; see nursing notes for details. FINDINGS: A total of approximately 3.6 L of hazy, yellow fluid was removed. IMPRESSION: Successful ultrasound-guided paracentesis yielding 3.6 liters of peritoneal fluid. PLAN: If the patient eventually requires >/=2 paracenteses in a 30 day period, candidacy for formal evaluation by the [facility] will be assessed. Read by: Narda Rutherford, AGNP-BC Electronically Signed   By: Albin Felling M.D.   On: 05/22/2022 15:59   MR KNEE RIGHT WO CONTRAST  Result Date: 05/21/2022 CLINICAL DATA:  Septic arthritis suspected, knee, no prior imaging increased pain, redness, swelling, discharge from right knee wound about 2 weeks after surgery for septic bursitis; non-compliant with antibiotics EXAM: MRI OF THE RIGHT KNEE WITHOUT CONTRAST TECHNIQUE: Multiplanar, multisequence MR imaging of the knee was performed. No intravenous contrast was administered. COMPARISON:  MRI  05/06/2022 FINDINGS: Technical Note: Despite efforts by the technologist and patient, motion artifact is present on today's exam and could not be eliminated. This reduces exam sensitivity and specificity. Bones/Joint/Cartilage No acute fracture. No dislocation. Mild tricompartmental chondral irregularities, without interval progression from prior. Small knee joint effusion, similar in size to prior. Mild patchy  marrow edema within the posterior aspect of the medial tibial plateau is unchanged from prior and likely reactive to overlying medial meniscal tear. There is also mild patchy marrow edema within the lateral aspect of the patella. Preservation of the fatty T1 marrow signal. No focal bone erosion. Ligaments and Menisci Oblique undersurface tear of the medial meniscal posterior horn and body. Intact lateral meniscus. Intact cruciate and collateral ligaments. Muscles and Tendons Musculotendinous structures within normal limits. Soft tissues Diffuse circumferential soft tissue edema and ill-defined fluid throughout the knee with overlying skin thickening. Focal fluid collection within the prepatellar soft tissues has increased in size now measuring approximately 4.0 x 1.0 x 3.7 cm. Collection has communication to the skin surface anteriorly, likely related to interval incision and drainage. IMPRESSION: 1. Similar degree of diffuse circumferential soft tissue edema and ill-defined fluid throughout the knee compatible cellulitis. Focal fluid collection within the prepatellar soft tissues has increased in size now measuring approximately 4.0 x 1.0 x 3.7 cm. Collection has communication to the skin surface anteriorly, likely related to interval incision and drainage. 2. Unchanged small knee joint effusion is favored reactive secondary to internal derangement. Septic arthritis is felt to be unlikely. 3. Mild patchy marrow edema within the lateral aspect of the patella with preservation of the T1 marrow signal. This could be reactive or reflect a mild bone contusion. No evidence to suggest acute osteomyelitis at this time. 4. 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 Electronically Signed   By: Davina Poke D.O.   On: 05/21/2022 08:18      Subjective:   Discharge Exam: Vitals:   06/15/22 0808 06/15/22 0950  BP: 104/63 119/73  Pulse: 83 74  Resp: 16   Temp: 98.2 F (36.8 C)   SpO2: 100%  96%   Vitals:   06/14/22 2009 06/15/22 0443 06/15/22 0808 06/15/22 0950  BP: (!) 96/54 110/68 104/63 119/73  Pulse: 81 92 83 74  Resp: 16 20 16    Temp: 98.3 F (36.8 C) 98.7 F (37.1 C) 98.2  F (36.8 C)   TempSrc: Oral Oral Oral   SpO2: 100% 97% 100% 96%  Weight:      Height:        General: Pt is alert, awake, not in acute distress Cardiovascular: RRR, S1/S2 +, no rubs, no gallops Respiratory: CTA bilaterally, no wheezing, no rhonchi Abdominal: Soft, NT, ND, bowel sounds + Extremities: no edema, no cyanosis    The results of significant diagnostics from this hospitalization (including imaging, microbiology, ancillary and laboratory) are listed below for reference.     Microbiology: Recent Results (from the past 240 hour(s))  Body fluid culture w Gram Stain     Status: None   Collection Time: 06/08/22 11:39 AM   Specimen: PATH Cytology Peritoneal fluid  Result Value Ref Range Status   Specimen Description   Final    PERITONEAL Performed at [facility], [address], [place], [place] [address]    Special Requests   Final    NONE Performed at [facility], [address] [place], [place] [address]    Gram Stain   Final    WBC PRESENT,BOTH PMN AND MONONUCLEAR NO ORGANISMS SEEN CYTOSPIN SMEAR    Culture   Final    NO GROWTH 3 DAYS Performed at [facility], [address] [place], [place] [address]    Report Status 06/11/2022 FINAL  Final     Labs: BNP (last 3 results) Recent Labs    04/19/22 2327  BNP 14.2   Basic Metabolic Panel: Recent Labs  Lab 06/09/22 1036 06/15/22 0353  NA 134* 136  K 3.9 4.0  CL 103 106  CO2 26 26  GLUCOSE 140* 99  BUN 14 17  CREATININE 0.59* 0.53*  CALCIUM 8.0* 8.0*   Liver Function Tests: Recent Labs  Lab 06/09/22 1036  AST 62*  ALT 55*  ALKPHOS 142*  BILITOT 2.2*  PROT 5.1*  ALBUMIN 2.5*   No results for input(s): "LIPASE", "AMYLASE" in the last 168 hours. No results for  input(s): "AMMONIA" in the last 168 hours. CBC: Recent Labs  Lab 06/09/22 1036 06/15/22 0353  WBC 2.8* 3.9*  NEUTROABS 1.7  --   HGB 8.9* 9.4*  HCT 27.2* 28.0*  MCV 100.7* 100.4*  PLT 75* 96*   Cardiac Enzymes: No results for input(s): "CKTOTAL", "CKMB", "CKMBINDEX", "TROPONINI" in the last 168 hours. BNP: Invalid input(s): "POCBNP" CBG: No results for input(s): "GLUCAP" in the last 168 hours. D-Dimer No results for input(s): "DDIMER" in the last 72 hours. Hgb A1c No results for input(s): "HGBA1C" in the last 72 hours. Lipid Profile No results for input(s): "CHOL", "HDL", "LDLCALC", "TRIG", "CHOLHDL", "LDLDIRECT" in the last 72 hours. Thyroid function studies No results for input(s): "TSH", "T4TOTAL", "T3FREE", "THYROIDAB" in the last 72 hours.  Invalid input(s): "FREET3" Anemia work up No results for input(s): "VITAMINB12", "FOLATE", "FERRITIN", "TIBC", "IRON", "RETICCTPCT" in the last 72 hours. Urinalysis    Component Value Date/Time   COLORURINE AMBER (A) 06/01/2022 1306   APPEARANCEUR CLEAR (A) 06/01/2022 1306   LABSPEC 1.026 06/01/2022 1306   PHURINE 6.0 06/01/2022 1306   GLUCOSEU NEGATIVE 06/01/2022 1306   HGBUR NEGATIVE 06/01/2022 1306   BILIRUBINUR NEGATIVE 06/01/2022 1306   KETONESUR NEGATIVE 06/01/2022 1306   PROTEINUR NEGATIVE 06/01/2022 1306   NITRITE NEGATIVE 06/01/2022 1306   LEUKOCYTESUR NEGATIVE 06/01/2022 1306   Sepsis Labs Recent Labs  Lab 06/09/22 1036 06/15/22 0353  WBC 2.8* 3.9*   Microbiology Recent Results (from the past 240  hour(s))  Body fluid culture w Gram Stain     Status: None   Collection Time: 06/08/22 11:39 AM   Specimen: PATH Cytology Peritoneal fluid  Result Value Ref Range Status   Specimen Description   Final    PERITONEAL Performed at [facility], [address] [place], [place] [address]    Special Requests   Final    NONE Performed at [facility], [address] [place], [place] [address]     Gram Stain   Final    WBC PRESENT,BOTH PMN AND MONONUCLEAR NO ORGANISMS SEEN CYTOSPIN SMEAR    Culture   Final    NO GROWTH 3 DAYS Performed at [facility], [place] [address] [place], [place] [address]    Report Status 06/11/2022 FINAL  Final     Time coordinating discharge: Over 30 minutes  SIGNED:   Nolberto Hanlon, MD  [facility] 06/15/2022, 1:49 PM Pager   If 7PM-7AM, please contact night-coverage [email or web address] Password [email or web address]

## 2022-06-15 NOTE — TOC Progression Note (Signed)
Transition of Care [facility]) - Progression Note    Patient Details  Name: Marco Spears MRN: [id number] Date of Birth: 1976-08-27  [facility] CM/SW Contact  Chapman Fitch, RN Phone Number: 06/15/2022, 11:24 AM  Clinical Narrative:     Cab voucher on chart for discharge Patient provided with Medical release form, he is to complete and I will fax to HIM.  Patient notified that it can take up to 30 days to receive records, and there is a charge to be printed   Expected Discharge Plan: Homeless Shelter Barriers to Discharge: Continued Medical Work up  Expected Discharge Plan and Services Expected Discharge Plan: Homeless Shelter                                               Social Determinants of Health (SDOH) Interventions    Readmission Risk Interventions    05/06/2022    2:26 PM  Readmission Risk Prevention Plan  Transportation Screening Complete  Medication Review Oceanographer) Complete

## 2022-06-15 NOTE — Plan of Care (Signed)
  Problem: Clinical Measurements: Goal: Will remain free from infection Outcome: Not Progressing   Problem: Clinical Measurements: Goal: Diagnostic test results will improve Outcome: Not Progressing   Problem: Skin Integrity: Goal: Risk for impaired skin integrity will decrease Outcome: Not Progressing

## 2022-06-15 NOTE — Progress Notes (Signed)
Date of Admission:  06/01/2022     ID: Marco Spears is a 46 y.o. male  Principal Problem:   AMS (altered mental status) Active Problems:   Alcoholic cirrhosis of liver with ascites (HCC)   Hypokalemia   Anemia   Polysubstance abuse (HCC)   Septic arthritis (HCC)   Thrombocytopenia (HCC)   Portal hypertension (HCC)   Bacteremia   Malnutrition of moderate degree    Subjective: Had paracentesis today 3 L removed Doing fine   Medications:   feeding supplement  237 mL Oral TID BM   ferrous sulfate  325 mg Oral Q breakfast   folic acid  1 mg Oral Daily   furosemide  40 mg Oral BID   gabapentin  300 mg Oral TID   methocarbamol  750 mg Oral TID   multivitamin with minerals  1 tablet Oral Daily   nicotine  21 mg Transdermal Daily   nystatin  5 mL Oral QID   polyethylene glycol  17 g Oral Daily   senna-docusate  1 tablet Oral BID   sodium chloride flush  3 mL Intravenous Q12H   spironolactone  100 mg Oral Daily   thiamine  100 mg Oral Daily    Objective:   PHYSICAL EXAM:  Patient Vitals for the past 24 hrs:  BP Temp Temp src Pulse Resp SpO2  06/15/22 0950 119/73 -- -- 74 -- 96 %  06/15/22 0808 104/63 98.2 F (36.8 C) Oral 83 16 100 %  06/15/22 0443 110/68 98.7 F (37.1 C) Oral 92 20 97 %  06/14/22 2009 (!) 96/54 98.3 F (36.8 C) Oral 81 16 100 %  06/14/22 1708 105/65 98.1 F (36.7 C) Oral 80 18 92 %    General:awake and alert Lungs: b/l air entry- Heart: s1s2 Abdomen: Soft, - rt side paracentesis dressing Extremities: rt knee wound vac has been removed- the sutures are coapted- No  oozing  Edema of the abdominal wall and lower leg much improved Lymph: Cervical, supraclavicular normal. Neurologic: Grossly non-focal  Lab Results    Latest Ref Rng & Units 06/15/2022    3:53 AM 06/09/2022   10:36 AM 06/07/2022    7:40 AM  CBC  WBC 4.0 - 10.5 K/uL 3.9  2.8  3.8   Hemoglobin 13.0 - 17.0 g/dL 9.4  8.9  8.5   Hematocrit 39.0 - 52.0 % 28.0  27.2  25.2    Platelets 150 - 400 K/uL 96  75  63        Latest Ref Rng & Units 06/15/2022    3:53 AM 06/09/2022   10:36 AM 06/07/2022    7:40 AM  CMP  Glucose 70 - 99 mg/dL 99  974  163   BUN 6 - 20 mg/dL 17  14  11    Creatinine 0.61 - 1.24 mg/dL  8.45  3.64   Sodium 135 - 145 mmol/L 136  134  136   Potassium 3.5 - 5.1 mmol/L 4.0  3.9  4.1   Chloride 98 - 111 mmol/L 106  103  106   CO2 22 - 32 mmol/L 26  26  26    Calcium 8.9 - 10.3 mg/dL 8.0  8.0  8.0   Total Protein 6.5 - 8.1 g/dL  5.1    Total Bilirubin 0.3 - 1.2 mg/dL  2.2    Alkaline Phos 38 - 126 U/L  142    AST 15 - 41 U/L  62    ALT 0 -  44 U/L  55        Microbiology: 06/01/22 BC- ESBL E.coli WC- Kleb oxytoca , acinetobacter Studies/Results: No results found.   Assessment/Plan: ?ESBL ecoli bacteremia- on meropenem until 06/15/22 Rt prepatellar septic bursitis recurrent with different organisms each time- 1st GAS, 2nd tome MRSA and now multiple gram neg organisms His homelessness, drug use are all contributing s/p I/D   klebsiella acinetobacter and pseudomonas= all susceptible to meropenem .  Will do cipro 500mg  PO BID for 2 weeks   Recent strep pyogenes infection of rt prepatellar bursa, then had MRSA infection and now gram neg rod   Encephalopathy from SUD- resolved   Decompensated cirrhosis with ascites Repeat Paracentesis done Cell count much improved Culture neg so far Anasarca secondary to the above HE needs to follow up with [facility] as they know him ? TIPS   Spontaneous bacteria peritonitis- on meropenem    Anemia Thrombocytopenia Hyperbilirubinemia Leucopenia All of the above due to decompensated cirrhosis ? Crystal meth use   Discussed the management with patient and care team High risk of readmission due to homelessness and SUD F/U as OP

## 2022-06-16 ENCOUNTER — Other Ambulatory Visit: Payer: Self-pay

## 2022-06-22 NOTE — Progress Notes (Signed)
   Portal Hypertension Clinic Screening Evaluation   Indication for evaluation: Marco Spears is a 46 y.o. male undergoing preliminary evaluation in the [facility] Portal Hypertension [facility] due to recurrent ascites.  Referring Physician/Established Gastroenterologist: None - has seen Dr. Timothy Lasso as inpatient consult  Etiology of cirrhosis: EtOH/HCV Initially diagnosed: Uncertain # of paracentesis in last month: 7 # of paracentesis in last 2 months: 8 History of hepatic hydrothorax:  no History of hepatic encephalopathy: no  Prior evaluation for liver transplant: no History of hepatocellular carcinoma: no  Prior esophagogastroduodenoscopy/intervention: no Current esophageal varices: uncertain Current gastric varices: uncertain History of hematemesis: no  Current diuretic regimen: furosemide 40 mg BID, spironolactone 100 mg QD Current pharmacologic encephalopathy prophylaxis/treatment: none  History of renal dysfunction: no History of hemodialysis: no  History of cardiac dysfunction: no  Other pertinent past medical history: polysubstance abuse, homeless, septic arthritis   Imaging: Prior cross sectional imaging of portal system: US Abdomen 06/02/22 - patent portal veins  Echocardiogram:  04/21/22 IMPRESSIONS   1. Left ventricular ejection fraction, by estimation, is 65 to 70%. The  left ventricle has normal function. The left ventricle has no regional  wall motion abnormalities. Left ventricular diastolic parameters were  normal.   2. Right ventricular systolic function is normal. The right ventricular  size is normal. Tricuspid regurgitation signal is inadequate for assessing  PA pressure.   3. The mitral valve is normal in structure. Trivial mitral valve  regurgitation. No evidence of mitral stenosis.   4. The aortic valve has an indeterminant number of cusps. There is mild  thickening of the aortic valve. Aortic valve regurgitation is  not  visualized. Aortic valve sclerosis is present, with no evidence of aortic  valve stenosis.    Labs: 8/7-21/23: Creatinine: 0.53 Total Bilirubin: 2.2 INR: 1.5 Sodium: 136 Albumin: 2.5  Child-Pugh = C points, class 10 MELD = 14 (6.0% estimated 3 month mortality) [place] Index of Post-TIPS Survival (FIPS) = -1.02 (Overall survival estimated at 98.3% at 1 month, 94.9% at 3 months, and 92.7% at 6 months)    Assessment: Marco Spears is a 46 y.o. male with history of alcoholic and hepatitis C cirrhosis (Child Pugh C, MELD 14) with recurrent ascites of recent onset.  After preliminary evaluation, this patient is a marginal candidate for TIPS creation, primarily due to socioeconomic status and homelessness.  Additionally, he has plenty of room to continue to increase diuretics.  Furthermore, he has no established outpatient Gastroenterologist to the best of my knowledge.  If he maximizes diuresis and then has truly refractory ascites, he would be an excellent candidate for TIPS creation from a technical standpoint.  His social factors are a huge barrier to his health, specifically outpatient follow up and compliance with medications after TIPS such as lactulose.    There is a case to be made that he may overall benefit, despite theses aforementioned barriers, from TIPS creation simply to keep him from having to use EMS to present for weekly paracenteses/admission to ED.    Recommendation: Formal consultation for TIPS could be considered, however I strongly recommend outpatient care establishment with Gastroenterology and maximization of diuretics prior to this.    Electronically Signed: Bennie Dallas, MD 06/22/2022, 10:22 AM

## 2022-06-28 ENCOUNTER — Inpatient Hospital Stay: Payer: Medicaid Other

## 2022-06-28 ENCOUNTER — Emergency Department: Payer: Medicaid Other

## 2022-06-28 ENCOUNTER — Other Ambulatory Visit: Payer: Self-pay

## 2022-06-28 ENCOUNTER — Encounter: Payer: Self-pay | Admitting: Emergency Medicine

## 2022-06-28 ENCOUNTER — Inpatient Hospital Stay
Admission: EM | Admit: 2022-06-28 | Discharge: 2022-07-03 | DRG: 871 | Disposition: A | Discharge status: Home / Self Care | Payer: Medicaid Other | Attending: Internal Medicine | Admitting: Internal Medicine

## 2022-06-28 DIAGNOSIS — Z6834 Body mass index (BMI) 34.0-34.9, adult: Secondary | ICD-10-CM

## 2022-06-28 DIAGNOSIS — D638 Anemia in other chronic diseases classified elsewhere: Secondary | ICD-10-CM | POA: Diagnosis present

## 2022-06-28 DIAGNOSIS — Z79899 Other long term (current) drug therapy: Secondary | ICD-10-CM

## 2022-06-28 DIAGNOSIS — N5089 Other specified disorders of the male genital organs: Secondary | ICD-10-CM | POA: Diagnosis present

## 2022-06-28 DIAGNOSIS — F1010 Alcohol abuse, uncomplicated: Secondary | ICD-10-CM | POA: Diagnosis present

## 2022-06-28 DIAGNOSIS — L03115 Cellulitis of right lower limb: Secondary | ICD-10-CM | POA: Diagnosis present

## 2022-06-28 DIAGNOSIS — L03116 Cellulitis of left lower limb: Secondary | ICD-10-CM | POA: Diagnosis present

## 2022-06-28 DIAGNOSIS — E871 Hypo-osmolality and hyponatremia: Secondary | ICD-10-CM | POA: Diagnosis present

## 2022-06-28 DIAGNOSIS — G9341 Metabolic encephalopathy: Secondary | ICD-10-CM | POA: Diagnosis present

## 2022-06-28 DIAGNOSIS — K7031 Alcoholic cirrhosis of liver with ascites: Secondary | ICD-10-CM | POA: Diagnosis present

## 2022-06-28 DIAGNOSIS — Z7189 Other specified counseling: Secondary | ICD-10-CM | POA: Diagnosis not present

## 2022-06-28 DIAGNOSIS — F1910 Other psychoactive substance abuse, uncomplicated: Secondary | ICD-10-CM | POA: Diagnosis not present

## 2022-06-28 DIAGNOSIS — Z880 Allergy status to penicillin: Secondary | ICD-10-CM

## 2022-06-28 DIAGNOSIS — Z5900 Homelessness unspecified: Secondary | ICD-10-CM

## 2022-06-28 DIAGNOSIS — K766 Portal hypertension: Secondary | ICD-10-CM | POA: Diagnosis present

## 2022-06-28 DIAGNOSIS — K529 Noninfective gastroenteritis and colitis, unspecified: Secondary | ICD-10-CM | POA: Diagnosis present

## 2022-06-28 DIAGNOSIS — Z86718 Personal history of other venous thrombosis and embolism: Secondary | ICD-10-CM | POA: Diagnosis not present

## 2022-06-28 DIAGNOSIS — F17210 Nicotine dependence, cigarettes, uncomplicated: Secondary | ICD-10-CM | POA: Diagnosis present

## 2022-06-28 DIAGNOSIS — T8130XA Disruption of wound, unspecified, initial encounter: Secondary | ICD-10-CM | POA: Diagnosis present

## 2022-06-28 DIAGNOSIS — A419 Sepsis, unspecified organism: Secondary | ICD-10-CM

## 2022-06-28 DIAGNOSIS — Z515 Encounter for palliative care: Secondary | ICD-10-CM | POA: Diagnosis not present

## 2022-06-28 DIAGNOSIS — R54 Age-related physical debility: Secondary | ICD-10-CM | POA: Diagnosis present

## 2022-06-28 DIAGNOSIS — Y92009 Unspecified place in unspecified non-institutional (private) residence as the place of occurrence of the external cause: Secondary | ICD-10-CM

## 2022-06-28 DIAGNOSIS — Z66 Do not resuscitate: Secondary | ICD-10-CM | POA: Diagnosis present

## 2022-06-28 DIAGNOSIS — F1510 Other stimulant abuse, uncomplicated: Secondary | ICD-10-CM | POA: Diagnosis present

## 2022-06-28 DIAGNOSIS — E8770 Fluid overload, unspecified: Secondary | ICD-10-CM | POA: Diagnosis present

## 2022-06-28 DIAGNOSIS — A4151 Sepsis due to Escherichia coli [E. coli]: Secondary | ICD-10-CM | POA: Diagnosis present

## 2022-06-28 DIAGNOSIS — T3696XA Underdosing of unspecified systemic antibiotic, initial encounter: Secondary | ICD-10-CM | POA: Diagnosis present

## 2022-06-28 DIAGNOSIS — K652 Spontaneous bacterial peritonitis: Secondary | ICD-10-CM | POA: Diagnosis not present

## 2022-06-28 DIAGNOSIS — E440 Moderate protein-calorie malnutrition: Secondary | ICD-10-CM | POA: Diagnosis present

## 2022-06-28 DIAGNOSIS — E669 Obesity, unspecified: Secondary | ICD-10-CM | POA: Diagnosis present

## 2022-06-28 DIAGNOSIS — K7682 Hepatic encephalopathy: Secondary | ICD-10-CM | POA: Diagnosis present

## 2022-06-28 DIAGNOSIS — M545 Low back pain, unspecified: Secondary | ICD-10-CM | POA: Diagnosis present

## 2022-06-28 DIAGNOSIS — R6520 Severe sepsis without septic shock: Secondary | ICD-10-CM | POA: Diagnosis present

## 2022-06-28 DIAGNOSIS — Z91128 Patient's intentional underdosing of medication regimen for other reason: Secondary | ICD-10-CM

## 2022-06-28 DIAGNOSIS — D696 Thrombocytopenia, unspecified: Secondary | ICD-10-CM | POA: Diagnosis present

## 2022-06-28 DIAGNOSIS — R339 Retention of urine, unspecified: Secondary | ICD-10-CM | POA: Diagnosis present

## 2022-06-28 DIAGNOSIS — F1210 Cannabis abuse, uncomplicated: Secondary | ICD-10-CM | POA: Diagnosis present

## 2022-06-28 DIAGNOSIS — R9431 Abnormal electrocardiogram [ECG] [EKG]: Secondary | ICD-10-CM | POA: Diagnosis present

## 2022-06-28 DIAGNOSIS — G8929 Other chronic pain: Secondary | ICD-10-CM | POA: Diagnosis present

## 2022-06-28 DIAGNOSIS — B1920 Unspecified viral hepatitis C without hepatic coma: Secondary | ICD-10-CM | POA: Diagnosis present

## 2022-06-28 DIAGNOSIS — Z597 Insufficient social insurance and welfare support: Secondary | ICD-10-CM

## 2022-06-28 LAB — LACTIC ACID, PLASMA
Lactic Acid, Venous: 3.1 mmol/L (ref 0.5–1.9)
Lactic Acid, Venous: 3.4 mmol/L (ref 0.5–1.9)
Lactic Acid, Venous: >9 mmol/L (ref 0.5–1.9)
Lactic Acid, Venous: 3.2 mmol/L (ref 0.5–1.9)

## 2022-06-28 LAB — BLOOD CULTURE ID PANEL (REFLEXED) - BCID2

## 2022-06-28 LAB — CBC WITH DIFFERENTIAL/PLATELET
Abs Immature Granulocytes: 0.01 10*3/uL (ref 0.00–0.07)
Abs Immature Granulocytes: 0.02 10*3/uL (ref 0.00–0.07)
Basophils Absolute: 0 10*3/uL (ref 0.0–0.1)
Basophils Absolute: 0 10*3/uL (ref 0.0–0.1)
Basophils Relative: 0 %
Basophils Relative: 0 %
Eosinophils Absolute: 0 10*3/uL (ref 0.0–0.5)
Eosinophils Absolute: 0 10*3/uL (ref 0.0–0.5)
Eosinophils Relative: 0 %
Eosinophils Relative: 0 %
HCT: 36.2 % — ABNORMAL LOW (ref 39.0–52.0)
HCT: 32.3 % — ABNORMAL LOW (ref 39.0–52.0)
Hemoglobin: 11.8 g/dL — ABNORMAL LOW (ref 13.0–17.0)
Hemoglobin: 10.7 g/dL — ABNORMAL LOW (ref 13.0–17.0)
Immature Granulocytes: 0 %
Immature Granulocytes: 0 %
Lymphocytes Relative: 5 %
Lymphocytes Relative: 4 %
Lymphs Abs: 0.3 10*3/uL — ABNORMAL LOW (ref 0.7–4.0)
Lymphs Abs: 0.2 10*3/uL — ABNORMAL LOW (ref 0.7–4.0)
MCH: 32.1 pg (ref 26.0–34.0)
MCH: 32.5 pg (ref 26.0–34.0)
MCHC: 32.6 g/dL (ref 30.0–36.0)
MCHC: 33.1 g/dL (ref 30.0–36.0)
MCV: 98.4 fL (ref 80.0–100.0)
MCV: 98.2 fL (ref 80.0–100.0)
Monocytes Absolute: 0.2 10*3/uL (ref 0.1–1.0)
Monocytes Absolute: 0.1 10*3/uL (ref 0.1–1.0)
Monocytes Relative: 3 %
Monocytes Relative: 3 %
Neutro Abs: 5 10*3/uL (ref 1.7–7.7)
Neutro Abs: 4.8 10*3/uL (ref 1.7–7.7)
Neutrophils Relative %: 92 %
Neutrophils Relative %: 93 %
Platelets: 71 10*3/uL — ABNORMAL LOW (ref 150–400)
Platelets: 55 10*3/uL — ABNORMAL LOW (ref 150–400)
RBC: 3.68 MIL/uL — ABNORMAL LOW (ref 4.22–5.81)
RBC: 3.29 MIL/uL — ABNORMAL LOW (ref 4.22–5.81)
RDW: 17.8 % — ABNORMAL HIGH (ref 11.5–15.5)
RDW: 17.9 % — ABNORMAL HIGH (ref 11.5–15.5)
Smear Review: NORMAL
WBC: 5.4 10*3/uL (ref 4.0–10.5)
WBC: 5.3 10*3/uL (ref 4.0–10.5)
nRBC: 0 % (ref 0.0–0.2)
nRBC: 0 % (ref 0.0–0.2)

## 2022-06-28 LAB — COMPREHENSIVE METABOLIC PANEL
ALT: 47 U/L — ABNORMAL HIGH (ref 0–44)
ALT: 38 U/L (ref 0–44)
AST: 73 U/L — ABNORMAL HIGH (ref 15–41)
AST: 57 U/L — ABNORMAL HIGH (ref 15–41)
Albumin: 2.5 g/dL — ABNORMAL LOW (ref 3.5–5.0)
Albumin: 2.3 g/dL — ABNORMAL LOW (ref 3.5–5.0)
Alkaline Phosphatase: 125 U/L (ref 38–126)
Alkaline Phosphatase: 103 U/L (ref 38–126)
Anion gap: 9 (ref 5–15)
Anion gap: 10 (ref 5–15)
BUN: 10 mg/dL (ref 6–20)
BUN: 11 mg/dL (ref 6–20)
CO2: 21 mmol/L — ABNORMAL LOW (ref 22–32)
CO2: 21 mmol/L — ABNORMAL LOW (ref 22–32)
Calcium: 7.9 mg/dL — ABNORMAL LOW (ref 8.9–10.3)
Calcium: 7.6 mg/dL — ABNORMAL LOW (ref 8.9–10.3)
Chloride: 103 mmol/L (ref 98–111)
Chloride: 101 mmol/L (ref 98–111)
Creatinine, Ser: 0.69 mg/dL (ref 0.61–1.24)
Creatinine, Ser: 0.58 mg/dL — ABNORMAL LOW (ref 0.61–1.24)
GFR, Estimated: >60 mL/min (ref >60)
GFR, Estimated: >60 mL/min (ref >60)
Glucose, Bld: 135 mg/dL — ABNORMAL HIGH (ref 70–99)
Glucose, Bld: 106 mg/dL — ABNORMAL HIGH (ref 70–99)
Potassium: 3.6 mmol/L (ref 3.5–5.1)
Potassium: 3.5 mmol/L (ref 3.5–5.1)
Sodium: 133 mmol/L — ABNORMAL LOW (ref 135–145)
Sodium: 132 mmol/L — ABNORMAL LOW (ref 135–145)
Total Bilirubin: 9.5 mg/dL — ABNORMAL HIGH (ref 0.3–1.2)
Total Bilirubin: 8.8 mg/dL — ABNORMAL HIGH (ref 0.3–1.2)
Total Protein: 5.7 g/dL — ABNORMAL LOW (ref 6.5–8.1)
Total Protein: 5.2 g/dL — ABNORMAL LOW (ref 6.5–8.1)

## 2022-06-28 LAB — URINE DRUG SCREEN, QUALITATIVE (ARMC ONLY)
Amphetamines, Ur Screen: POSITIVE — AB
Barbiturates, Ur Screen: NOT DETECTED
Benzodiazepine, Ur Scrn: NOT DETECTED
Cannabinoid 50 Ng, Ur ~~LOC~~: POSITIVE — AB
Cocaine Metabolite,Ur ~~LOC~~: NOT DETECTED
MDMA (Ecstasy)Ur Screen: NOT DETECTED
Methadone Scn, Ur: NOT DETECTED
Opiate, Ur Screen: NOT DETECTED
Phencyclidine (PCP) Ur S: NOT DETECTED
Tricyclic, Ur Screen: NOT DETECTED

## 2022-06-28 LAB — MAGNESIUM
Magnesium: 1.4 mg/dL — ABNORMAL LOW (ref 1.7–2.4)
Magnesium: 1.6 mg/dL — ABNORMAL LOW (ref 1.7–2.4)

## 2022-06-28 LAB — BODY FLUID CELL COUNT WITH DIFFERENTIAL
Eos, Fluid: 0 %
Lymphs, Fluid: 0 %
Monocyte-Macrophage-Serous Fluid: 5 %
Neutrophil Count, Fluid: 95 %
Total Nucleated Cell Count, Fluid: 7658 cu mm

## 2022-06-28 LAB — PROCALCITONIN
Procalcitonin: 1.02 ng/mL
Procalcitonin: 2.89 ng/mL

## 2022-06-28 LAB — URINALYSIS, ROUTINE W REFLEX MICROSCOPIC
Glucose, UA: NEGATIVE mg/dL
Hgb urine dipstick: NEGATIVE
Ketones, ur: 5 mg/dL — AB
Leukocytes,Ua: NEGATIVE
Nitrite: NEGATIVE
Protein, ur: 100 mg/dL — AB
Specific Gravity, Urine: 1.029 (ref 1.005–1.030)
pH: 5 (ref 5.0–8.0)

## 2022-06-28 LAB — TSH: TSH: 0.55 u[IU]/mL (ref 0.350–4.500)

## 2022-06-28 LAB — PROTEIN, PLEURAL OR PERITONEAL FLUID: Total protein, fluid: <3 g/dL

## 2022-06-28 LAB — PROTIME-INR
INR: 1.7 — ABNORMAL HIGH (ref 0.8–1.2)
Prothrombin Time: 19.5 seconds — ABNORMAL HIGH (ref 11.4–15.2)

## 2022-06-28 LAB — HEMOGLOBIN A1C
Hgb A1c MFr Bld: 3.9 % — ABNORMAL LOW (ref 4.8–5.6)
Mean Plasma Glucose: 65.23 mg/dL

## 2022-06-28 LAB — CBG MONITORING, ED
Glucose-Capillary: 140 mg/dL — ABNORMAL HIGH (ref 70–99)
Glucose-Capillary: 104 mg/dL — ABNORMAL HIGH (ref 70–99)

## 2022-06-28 LAB — AMMONIA: Ammonia: 41 umol/L — ABNORMAL HIGH (ref 9–35)

## 2022-06-28 LAB — C-REACTIVE PROTEIN: CRP: 3.4 mg/dL — ABNORMAL HIGH (ref <1.0)

## 2022-06-28 LAB — LIPASE, BLOOD: Lipase: 28 U/L (ref 11–51)

## 2022-06-28 LAB — TROPONIN I (HIGH SENSITIVITY)
Troponin I (High Sensitivity): 4 ng/L (ref <18)
Troponin I (High Sensitivity): 4 ng/L (ref <18)

## 2022-06-28 LAB — ETHANOL: Alcohol, Ethyl (B): <10 mg/dL (ref <10)

## 2022-06-28 LAB — SEDIMENTATION RATE: Sed Rate: 5 mm/hr (ref 0–15)

## 2022-06-28 LAB — BRAIN NATRIURETIC PEPTIDE: B Natriuretic Peptide: 49.1 pg/mL (ref 0.0–100.0)

## 2022-06-28 MED ORDER — FERROUS SULFATE 325 (65 FE) MG PO TABS: 325.0000 mg | ORAL_TABLET | Freq: Every day | ORAL | Status: DC

## 2022-06-28 MED ORDER — TRAMADOL HCL 50 MG PO TABS: 50.0000 mg | ORAL_TABLET | Freq: Three times a day (TID) | ORAL | Status: DC | PRN

## 2022-06-28 MED ORDER — HYDROCODONE-ACETAMINOPHEN 5-325 MG PO TABS: 1.0000 | ORAL_TABLET | ORAL | Status: DC | PRN

## 2022-06-28 MED ORDER — ALBUMIN HUMAN 25 % IV SOLN
37.5000 g | Freq: Once | INTRAVENOUS | Status: AC
  Administered 2022-06-28: 37.5 g via INTRAVENOUS
  Filled 2022-06-28: qty 150

## 2022-06-28 MED ORDER — LACTATED RINGERS IV SOLN
INTRAVENOUS | Status: DC
Start: 2022-06-28 — End: 2022-06-28

## 2022-06-28 MED ORDER — MORPHINE SULFATE (PF) 4 MG/ML IV SOLN
4.0000 mg | Freq: Once | INTRAVENOUS | Status: AC
  Administered 2022-06-28: 4 mg via INTRAVENOUS
  Filled 2022-06-28: qty 1

## 2022-06-28 MED ORDER — IBUPROFEN 800 MG PO TABS
800.0000 mg | ORAL_TABLET | Freq: Once | ORAL | Status: DC
  Filled 2022-06-28: qty 1

## 2022-06-28 MED ORDER — IOHEXOL 300 MG/ML  SOLN
100.0000 mL | Freq: Once | INTRAMUSCULAR | Status: AC | PRN
  Administered 2022-06-28: 100 mL via INTRAVENOUS

## 2022-06-28 MED ORDER — THIAMINE HCL 100 MG PO TABS
100.0000 mg | ORAL_TABLET | Freq: Every day | ORAL | Status: DC
  Filled 2022-06-28: qty 1

## 2022-06-28 MED ORDER — METRONIDAZOLE 500 MG/100ML IV SOLN
500.0000 mg | Freq: Once | INTRAVENOUS | Status: AC
  Administered 2022-06-28: 500 mg via INTRAVENOUS
  Filled 2022-06-28: qty 100

## 2022-06-28 MED ORDER — FOLIC ACID 1 MG PO TABS
1.0000 mg | ORAL_TABLET | Freq: Every day | ORAL | Status: DC
Start: 2022-06-28 — End: 2022-06-28

## 2022-06-28 MED ORDER — SODIUM CHLORIDE 0.9 % IV SOLN
2.0000 g | Freq: Three times a day (TID) | INTRAVENOUS | Status: DC
Start: 2022-06-28 — End: 2022-07-01
  Administered 2022-06-28 – 2022-07-01 (×10): 2 g via INTRAVENOUS
  Filled 2022-06-28 (×4): qty 12.5
  Filled 2022-06-28 (×3): qty 2
  Filled 2022-06-28 (×3): qty 12.5
  Filled 2022-06-28: qty 2

## 2022-06-28 MED ORDER — B COMPLEX-C PO TABS
1.0000 | ORAL_TABLET | Freq: Every day | ORAL | Status: DC
Start: 2022-06-28 — End: 2022-06-28
  Filled 2022-06-28: qty 1

## 2022-06-28 MED ORDER — VANCOMYCIN HCL 1500 MG/300ML IV SOLN
1500.0000 mg | Freq: Once | INTRAVENOUS | Status: AC
  Administered 2022-06-28: 1500 mg via INTRAVENOUS
  Filled 2022-06-28: qty 300

## 2022-06-28 MED ORDER — VANCOMYCIN HCL 1500 MG/300ML IV SOLN
1500.0000 mg | Freq: Two times a day (BID) | INTRAVENOUS | Status: DC
  Administered 2022-06-28 – 2022-07-01 (×6): 1500 mg via INTRAVENOUS
  Filled 2022-06-28 (×8): qty 300

## 2022-06-28 MED ORDER — MAGNESIUM SULFATE 2 GM/50ML IV SOLN
2.0000 g | Freq: Once | INTRAVENOUS | Status: AC
  Administered 2022-06-28: 2 g via INTRAVENOUS
  Filled 2022-06-28: qty 50

## 2022-06-28 MED ORDER — LACTATED RINGERS IV BOLUS
2000.0000 mL | Freq: Once | INTRAVENOUS | Status: AC
  Administered 2022-06-28: 2000 mL via INTRAVENOUS

## 2022-06-28 MED ORDER — ONDANSETRON HCL 4 MG/2ML IJ SOLN
4.0000 mg | Freq: Once | INTRAMUSCULAR | Status: AC
  Administered 2022-06-28: 4 mg via INTRAVENOUS
  Filled 2022-06-28: qty 2

## 2022-06-28 MED ORDER — VANCOMYCIN HCL IN DEXTROSE 1-5 GM/200ML-% IV SOLN
1000.0000 mg | Freq: Once | INTRAVENOUS | Status: AC
  Administered 2022-06-28: 1000 mg via INTRAVENOUS
  Filled 2022-06-28: qty 200

## 2022-06-28 MED ORDER — ALBUMIN HUMAN 25 % IV SOLN
12.5000 g | Freq: Once | INTRAVENOUS | Status: AC
  Administered 2022-06-28: 12.5 g via INTRAVENOUS
  Filled 2022-06-28: qty 50

## 2022-06-28 MED ORDER — ADULT MULTIVITAMIN W/MINERALS CH
1.0000 | ORAL_TABLET | Freq: Every day | ORAL | Status: DC
Start: 2022-06-28 — End: 2022-06-28

## 2022-06-28 MED ORDER — LACTULOSE 10 GM/15ML PO SOLN: 30.0000 g | Freq: Two times a day (BID) | ORAL | Status: DC | PRN

## 2022-06-28 MED ORDER — SODIUM CHLORIDE 0.9 % IV SOLN
2.0000 g | Freq: Once | INTRAVENOUS | Status: AC
  Administered 2022-06-28: 2 g via INTRAVENOUS
  Filled 2022-06-28: qty 12.5

## 2022-06-28 MED ORDER — LACTATED RINGERS IV BOLUS
1500.0000 mL | Freq: Once | INTRAVENOUS | Status: AC
  Administered 2022-06-28: 1500 mL via INTRAVENOUS

## 2022-06-28 NOTE — ED Notes (Signed)
Pt has returned to room from paracentesis - 7 liters pulled

## 2022-06-28 NOTE — Procedures (Signed)
PROCEDURE SUMMARY:  Successful US guided paracentesis from LLQ begun.  Yielded over 5L of ascitic fluid thus far without complications.  Pt is tolerating well.   Specimen being sent for labs.  Full report to follow in PACS  Evola Hollis PA-C 06/28/2022 3:25 PM

## 2022-06-28 NOTE — Progress Notes (Signed)
Pharmacy Antibiotic Note  Marco Spears is a 46 y.o. male w/ PMH of polysubstance abuse, etoh liver disease with cirrhosis, hep C s/p tx , DVT s/p tx with Eliquis,history of b/l lower extremity cellulitis as well as right knee infection admitted on 06/28/2022 with cellulitis.  Pharmacy has been consulted for vancomycin dosing. He received 2500 mg IV vancomycin in the ED  Plan: start vancomycin 1500mg  IV q12h based on adjusting CrCl to expect value based on age AUC goal 400-600 Predicted AUC 425 using SCr rounded to 1 mg/dl (est CrCl using this SCr = 108 mcg/mL) Monitor renal function and need to check vancomycin levels   Height: 6\' 3"  (190.5 cm) Weight: 108.9 kg (240 lb) IBW/kg (Calculated) : 84.5  Temp (24hrs), Avg:100.1 F (37.8 C), Min:98.6 F (37 C), Max:100.7 F (38.2 C)  Recent Labs  Lab 06/28/22 0214 06/28/22 0603 06/28/22 0650  WBC 5.4  --   --   CREATININE 0.69  --   --   LATICACIDVEN 3.1* 3.4* >9.0*    Estimated Creatinine Clearance: 150.6 mL/min (by C-G formula based on SCr of 0.69 mg/dL).    Allergies  Allergen Reactions   Amoxicillin Rash    TOLERATED CEFAZOLIN PRIOR States it makes his skin turn red    Antimicrobials this admission: 09/03 vancomycin >>  09/03 cefepime >>    Microbiology results: 09/03 BCx: NGTD 09/03 UCx: pending    Thank you for allowing pharmacy to be a part of this patient's care.  11/03 06/28/2022 7:40 AM

## 2022-06-28 NOTE — Progress Notes (Addendum)
  PROGRESS NOTE    Marco Spears  JTT:[id number] DOB: October 18, 1976 DOA: 06/28/2022 PCP: Pcp, No  [id number]/[id number]  LOS: 0 days   Brief hospital course: No notes on file  Assessment & Plan: Marco Spears is a 46 y.o. male with medical history significant of  polysubstance abuse, etoh liver disease with cirrhosis, hep C s/p tx , DVT s/p tx with Eliquis,history of b/l lower extremity cellulitis as well as right knee infection, who has recent hospitalization 8/7-8/21 with discharge diagnoses of  Septic arthritis of right Knee, ESBL  E coli bacteremia for which  patient was treated with meropenem and completed course 06/15/22. Patient discharge complicated by patient history of noncompliance and it was noted that he was a high risk for progression of infection due to his noncompliance. Patient now returns to ED BIBEMS with complaint of increase fluid. He notes that his abdomen has gotten mores swollen in the last 24 hour. He also has complaint of urinary retention.   Severe Sepsis  --fever, tachycardia, elevated lactic acid, source bacteremia and likely SBP --received vanc/cefe/flagyl and IVF in the ED.  Bacteremia from Enterobacterales --4/4 bottles pos, not ESBL --cont cefepime  Likely SBP --fever, large ascites, abdominal pain --US paracentesis today with fluid studies --IV albumin after paracentesis  Possible cellulitis around right knee Hx of prepatellar bursitis I&D x 2 (7/13, 7/27) with Dr. Martha Clan --Pt refused wound VAC application and was non-compliant with abx after discharge from last hospitalization Plan: --consult ortho today --consult wound care --cont vanc and cefe   Proctocolitis --as per imaging. --already on broad-spectrum abx for other infections  Cirrhosis with tense ascites/fluid overload -patient admits to be being non-compliant with his medications --US paracentesis today with fluid studies   Acute Metabolic Encephalopathy -multifactorial / sepsis/ hepatic  encephalopathy  Anemia -stable  -continue to monitor h/h    Polysubstance abuse + amphetamine/marijuana   Hypomag --replete with IV mag  Chronic thrombocytopenia  --due to cirrhosis   DVT prophylaxis: SCD/Compression stockings Code Status: Full code  Family Communication:  Level of care: Progressive Dispo:   The patient is from: home Anticipated d/c is to: undetermined Anticipated d/c date is: undetermined   Subjective and Interval History:  Pt reported abdominal pain, but no right knee pain.   Objective: Vitals:   06/28/22 1230 06/28/22 1330 06/28/22 1415 06/28/22 1536  BP: 124/79 126/77 136/77 121/77  Pulse: (!) 108 (!) 109 (!) 104 (!) 102  Resp: (!) 25 (!) 28 (!) 22 (!) 22  Temp: 99.5 F (37.5 C) 99.4 F (37.4 C)    TempSrc:      SpO2: 98% 99% 99% 100%  Weight:      Height:        Intake/Output Summary (Last 24 hours) at 06/28/2022 1539 Last data filed at 06/28/2022 1140 Gross per 24 hour  Intake 4250 ml  Output --  Net 4250 ml   Filed Weights   06/28/22 0156  Weight: 108.9 kg    Examination:   Constitutional: NAD, lethargic, oriented HEENT: conjunctivae and lids normal, EOMI CV: No cyanosis.   RESP: normal respiratory effort, on RA Ab: abdomen distended Extremities: edema in BLE.  Small open wound over right knee with mild surrounding erythema   Data Reviewed: I have personally reviewed labs and imaging studies  Time spent: 55 minutes  Darlin Priestly, MD [facility] If 7PM-7AM, please contact night-coverage 06/28/2022, 3:39 PM

## 2022-06-28 NOTE — H&P (Addendum)
History and Physical    Marco Spears:[id number] DOB: June 16, 1976 DOA: 06/28/2022  PCP: Pcp, No  Patient coming from: home  I have personally briefly reviewed patient's old medical records in [facility]  Chief Complaint: fluid overload  HPI: Marco Spears is a 46 y.o. male with medical history significant of  polysubstance abuse, etoh liver disease with cirrhosis, hep C s/p tx , DVT s/p tx with Eliquis,history of b/l lower extremity cellulitis as well as right knee infection, who has recent hospitalization 8/7-8/21 with discharge diagnoses of  Septic arthritis of right Knee, ESBL  E coli bacteremia for which  patient was treated with meropenem and completed course 06/15/22. Patient discharge complicated by patient history of noncompliance and it was noted that he was a high risk for progression of infection due to his noncompliance. Patient now returns to ED BIBEMS with complaint of increase fluid. He notes that his abdomen has gotten mores swollen in the last 24 hour. He also has complaint of urinary retention. ED Course:  In ED on evaluation he was found to have sepsis and patient was slated for admission. Tmx 100.2,, bp 138/77, hr 115, rr 22 , sat 97%  EKG: nsr  prolonged qt 661 UA: neg , rare bacteria, mod bili Ammonia 41 NA 133 close to baseline, cr 0.69,  ast 73, alst 47  Lipase 28 Ce4 Inr 1.7 UDS: + amphetamines, + marijuana  Lacti 3.1, 3.4 Mag 1.4 Wbc 5.4, hgb 11.8,  plt 71, pmn 92 Bnp 49 procal 1.02 cxrPulmonary hypoinflation. Progressive elevation of the right hemidiaphragm.  u/s : dvt neg b/l leg Tx in ed zofran,morphine, vanc, cefepime , metronidazole,albumin Review of Systems: As per HPI otherwise 10 point review of systems negative.   Past Medical History:  Diagnosis Date   Cirrhosis of liver (HCC)    Heart murmur    Polysubstance abuse (HCC)    Septic bursitis     Past Surgical History:  Procedure Laterality Date   INCISION AND DRAINAGE Right  05/07/2022   Procedure: INCISION AND DRAINAGE;  Surgeon: Juanell Fairly, MD;  Location: [facility] ORS;  Service: Orthopedics;  Laterality: Right;   IRRIGATION AND DEBRIDEMENT KNEE Right 05/21/2022   Procedure: IRRIGATION AND DEBRIDEMENT KNEE;  Surgeon: Juanell Fairly, MD;  Location: [facility] ORS;  Service: Orthopedics;  Laterality: Right;     reports that he has been smoking cigarettes. He has never used smokeless tobacco. He reports current drug use. Drugs: Marijuana, Methamphetamines, and IV. He reports that he does not drink alcohol.  Allergies  Allergen Reactions   Amoxicillin Rash    TOLERATED CEFAZOLIN PRIOR States it makes his skin turn red    History reviewed. No pertinent family history. Prior to Admission medications   Medication Sig Start Date End Date Taking? Authorizing Provider  B Complex-C (B-COMPLEX WITH VITAMIN C) tablet Take 1 tablet by mouth daily.    [provider]  ciprofloxacin (CIPRO) 500 MG tablet Take 1 tablet (500 mg total) by mouth 2 (two) times daily for 14 days. 06/16/22 06/30/22  Lynn Ito, MD  ferrous sulfate 325 (65 FE) MG tablet Take 1 tablet (325 mg total) by mouth daily with breakfast. 05/22/22   Tresa Moore, MD  folic acid (FOLVITE) 1 MG tablet Take 1 tablet (1 mg total) by mouth daily. 05/22/22   Tresa Moore, MD  furosemide (LASIX) 40 MG tablet Take 1 tablet (40 mg total) by mouth 2 (two) times daily. 05/22/22   Georgeann Oppenheim, [employer]  B, MD  gabapentin (NEURONTIN) 400 MG capsule Take 1 capsule (400 mg total) by mouth 3 (three) times daily for 21 days. 05/26/22 06/16/22  Tresa Moore, MD  Multiple Vitamin (MULTIVITAMIN WITH MINERALS) TABS tablet Take 1 tablet by mouth daily.    [provider]  spironolactone (ALDACTONE) 50 MG tablet Take 2 tablets (100 mg total) by mouth daily. 05/22/22   Tresa Moore, MD  thiamine (VITAMIN B1) 100 MG tablet Take 1 tablet (100 mg total) by mouth daily. 06/16/22 07/16/22  Lynn Ito, MD     Physical Exam: Vitals:   06/28/22 0215 06/28/22 0230 06/28/22 0245 06/28/22 0300  BP:  131/72  126/77  Pulse: (!) 117 (!) 112 (!) 112 (!) 112  Resp: (!) 39 (!) 24 (!) 27 (!) 25  Temp: 98.6 F (37 C) 100.2 F (37.9 C) 100.2 F (37.9 C) 100.1 F (37.8 C)  TempSrc:      SpO2: 99% 96% 96% 95%  Weight:      Height:         Vitals:   06/28/22 0215 06/28/22 0230 06/28/22 0245 06/28/22 0300  BP:  131/72  126/77  Pulse: (!) 117 (!) 112 (!) 112 (!) 112  Resp: (!) 39 (!) 24 (!) 27 (!) 25  Temp: 98.6 F (37 C) 100.2 F (37.9 C) 100.2 F (37.9 C) 100.1 F (37.8 C)  TempSrc:      SpO2: 99% 96% 96% 95%  Weight:      Height:      Constitutional: NAD, calm, comfortable Eyes: PERRL, lids  icteric sclera  ENMT: Mucous membranes are dry. Posterior pharynx clear of any exudate or lesions Neck: normal, supple, no masses, no thyromegaly Respiratory: clear to auscultation bilaterally, no wheezing, no crackles. Normal respiratory effort. No accessory muscle use.  Cardiovascular: tachycardic, no murmurs / rubs / gallops. + extremity edema.+ext pink cool. .  Abdomen: +tenderness diffuse, no masses palpated. No hepatosplenomegaly. Bowel sounds positive.  Musculoskeletal: no clubbing / cyanosis. No joint deformity upper and lower extremities. Good ROM, no contractures. Normal muscle tone.  Skin: no rashes, lesions, ulcers. No induration Neurologic: CN 2-12 grossly intact. Sensation intact,  Strength 5/5 in all 4.  Psychiatric: Normal judgment and insight. Alert and oriented x 2. Normal mood.    Labs on Admission: I have personally reviewed following labs and imaging studies  CBC: Recent Labs  Lab 06/28/22 0214  WBC 5.4  NEUTROABS 5.0  HGB 11.8*  HCT 36.2*  MCV 98.4  PLT 71*   Basic Metabolic Panel: Recent Labs  Lab 06/28/22 0214  NA 133*  K 3.6  CL 103  CO2 21*  GLUCOSE 135*  BUN 10  CREATININE 0.69  CALCIUM 7.9*  MG 1.4*   GFR: Estimated Creatinine Clearance: 150.6  mL/min (by C-G formula based on SCr of 0.69 mg/dL). Liver Function Tests: Recent Labs  Lab 06/28/22 0214  AST 73*  ALT 47*  ALKPHOS 125  BILITOT 9.5*  PROT 5.7*  ALBUMIN 2.5*   Recent Labs  Lab 06/28/22 0214  LIPASE 28   Recent Labs  Lab 06/28/22 0214  AMMONIA 41*   Coagulation Profile: Recent Labs  Lab 06/28/22 0214  INR 1.7*   Cardiac Enzymes: No results for input(s): "CKTOTAL", "CKMB", "CKMBINDEX", "TROPONINI" in the last 168 hours. BNP (last 3 results) No results for input(s): "PROBNP" in the last 8760 hours. HbA1C: No results for input(s): "HGBA1C" in the last 72 hours. CBG: Recent Labs  Lab 06/28/22 [phone]  GLUCAP 140*   Lipid Profile: No results for input(s): "CHOL", "HDL", "LDLCALC", "TRIG", "CHOLHDL", "LDLDIRECT" in the last 72 hours. Thyroid Function Tests: No results for input(s): "TSH", "T4TOTAL", "FREET4", "T3FREE", "THYROIDAB" in the last 72 hours. Anemia Panel: No results for input(s): "VITAMINB12", "FOLATE", "FERRITIN", "TIBC", "IRON", "RETICCTPCT" in the last 72 hours. Urine analysis:    Component Value Date/Time   COLORURINE AMBER (A) 06/28/2022 0214   APPEARANCEUR HAZY (A) 06/28/2022 0214   LABSPEC 1.029 06/28/2022 0214   PHURINE 5.0 06/28/2022 0214   GLUCOSEU NEGATIVE 06/28/2022 0214   HGBUR NEGATIVE 06/28/2022 0214   BILIRUBINUR MODERATE (A) 06/28/2022 0214   KETONESUR 5 (A) 06/28/2022 0214   PROTEINUR 100 (A) 06/28/2022 0214   NITRITE NEGATIVE 06/28/2022 0214   LEUKOCYTESUR NEGATIVE 06/28/2022 0214    Radiological Exams on Admission: CT ABDOMEN PELVIS W CONTRAST  Result Date: 06/28/2022 CLINICAL DATA:  Abdominal pain, acute, nonlocalized. Alcoholic cirrhosis. EXAM: CT ABDOMEN AND PELVIS WITH CONTRAST TECHNIQUE: Multidetector CT imaging of the abdomen and pelvis was performed using the standard protocol following bolus administration of intravenous contrast. RADIATION DOSE REDUCTION: This exam was performed according to the  departmental dose-optimization program which includes automated exposure control, adjustment of the mA and/or kV according to patient size and/or use of iterative reconstruction technique. CONTRAST:  OMNIPAQUE IOHEXOL 300 MG/ML  SOLN COMPARISON:  None Available. FINDINGS: Lower chest: Mild bibasilar dependent atelectasis, right greater than left. Gastroesophageal varices noted adjacent to the distal esophagus. Cardiac size within normal limits. Gynecomastia noted Hepatobiliary: The liver is shrunken and nodular in keeping with changes of cirrhosis. A slightly hyperenhancing 2 cm nodule is seen within the subserosal left hepatic lobe at axial image # 16/3, not well characterized on this examination. This may represent a dysplastic or regenerative nodule, however, a focal hepatoma is not excluded. No intra or extrahepatic biliary ductal dilation. There is recanalization of the umbilical vein which ultimately communicates to the common femoral veins. Cholelithiasis noted without definite evidence of acute cholecystitis. Pancreas: 13 mm cystic lesion within the a head neck juncture of the pancreas is indeterminate, possibly representing a pancreatic cyst, cystic neoplasm, or dilated pancreatic side branch. The pancreas is otherwise unremarkable. Spleen: The spleen is markedly enlarged measuring up to 20 cm in greatest dimension. No intrasplenic lesions are seen Adrenals/Urinary Tract: The adrenal glands are unremarkable. The kidneys are normal. Foley catheter balloon seen within a decompressed bladder lumen. Stomach/Bowel: Small varices are seen within the gastric cardia. There is circumferential bowel wall thickening involving the gastric antrum, duodenum, and proximal jejunum which is nonspecific, but can be seen in the setting of portal venous hypertension. Inflammatory conditions, however, such as infectious gastroenteritis could appear similarly, however. There is, additionally, circumferential bowel wall  thickening involving the rectum and colon with mild pericolonic inflammatory stranding, best appreciated involving the mid sigmoid colon. While changes of portal colopathy can be seen these typically preferentially affect the right colon and the extent favors the presence of an underlying infectious or inflammatory proctocolitis. No evidence of obstruction. Large volume ascites. No free intraperitoneal gas. Vascular/Lymphatic: Main portal vein is patent. Evidence of portal venous hypertension with gastroesophageal varices and portosystemic collateralization via the umbilical vein to the common femoral veins is noted. Mild aortoiliac atherosclerotic calcification. No aortic aneurysm. No pathologic adenopathy within the abdomen and pelvis. Reproductive: Prostate is unremarkable. Other: Small fluid containing umbilical hernia. Moderate diffuse subcutaneous body wall edema noted. Musculoskeletal: No acute bone abnormality. No lytic or blastic bone lesion IMPRESSION:  1. Morphologic changes in keeping with cirrhosis and portal venous hypertension with marked splenomegaly, gastroesophageal varices, and portosystemic collateralization via the umbilical vein to the common femoral veins. Large volume ascites. 2. 2 cm hyperenhancing nodule within the subserosal left hepatic lobe, not well characterized on this examination. This may represent a dysplastic or regenerative nodule, however, a focal hepatoma is not excluded. Correlation with serum AFP level and/or dedicated contrast enhanced MRI examination is recommended for further evaluation. 3. Circumferential bowel wall thickening involving the gastric antrum, duodenum, proximal jejunum,, possibly representing changes of portal venous hypertension and resultant edema versus is infectious or inflammatory conditions such as infectious gastroenteritis. 4. Thickening involving the rectum and colon with mild pericolonic inflammatory stranding, best appreciated involving the mid  sigmoid colon. While changes of portal colopathy can be seen in the setting of portal venous hypertension, these conditions typically preferentially affect the right colon and the extent favors the presence of an infectious or inflammatory proctocolitis. 5. 13 mm cystic lesion within the head neck juncture of the pancreas. This may represent a pancreatic cyst, cystic neoplasm, or dilated pancreatic side branch. This could be further assessed with MRI examination once the patient's acute issues have resolved. 6. Cholelithiasis. Electronically Signed   By: Helyn Numbers M.D.   On: 06/28/2022 04:11   DG Chest Portable 1 View  Result Date: 06/28/2022 CLINICAL DATA:  Dyspnea EXAM: PORTABLE CHEST 1 VIEW COMPARISON:  06/01/2022 FINDINGS: Lungs volumes are small and there is progressive elevation of the right hemidiaphragm. No pneumothorax or pleural effusion. Cardiac size within normal limits. Pulmonary vascularity is normal. Osseous structures are age-appropriate. No acute bone abnormality. IMPRESSION: Pulmonary hypoinflation. Progressive elevation of the right hemidiaphragm. Electronically Signed   By: Helyn Numbers M.D.   On: 06/28/2022 02:48    EKG: Independently reviewed. See above   Assessment/Plan Sepsis ,severe -presumed gi source, proctocolitis vs SBP vs recurrent bacteremia -continue with broad spectrum abx  -s/p goal directed fluid in ED  - gently fluid with ivf for 1 L  then prn  -f/u blood cultures, repeat inflammatory markers   Proctocolitis -stool studies pending  -continue on broad spectrum abx    Prolonged QT  -in setting of hypomagnesemia -replete prn  -repeat ekg  Hx of Right knee  wound -s/p tx of septic arthritis  -does not appear infected -wound care to follow   Cirrhosis with tense ascites/fluid overload - IR for drainage as able  -patient admits to be being non-compliant with his medications -resume home regimen as able  -consider low volume paracentesis - IR  consult placed   Acute Metabolic Encephalopathy -multifactorial / sepsis/ hepatic encephalopathy -treat underlying cause -resume tx for hepatic encephalopathy when able   Anemia -stable  -continue to monitor h/h   Polysubstance abuse + amphetamine/marijuana  Encourage cessation  -ciwa -denies etoh however not reliable historian    DVT prophylaxis: scd Code Status: full Family Communication: none at bedside Disposition Plan: patient  expected to be admitted greater than 2 midnights  Consults called: n/a Admission status: sdu    Lurline Del MD [facility]   If 7PM-7AM, please contact night-coverage [email or web address] Password [email or web address]  06/28/2022, 5:08 AM

## 2022-06-28 NOTE — ED Notes (Signed)
Admitting MD at bedside, pt placed on 2L via [place] due to patient desating to 91-92% on RA.

## 2022-06-28 NOTE — ED Notes (Signed)
Pt requests ice chips. Wrote to provider to ask if ok or if NPO (diet/NPO status not listed in orders).

## 2022-06-28 NOTE — ED Notes (Signed)
Pt going now for paracentesis. Urine in foley bag is dark and blood tinged.

## 2022-06-28 NOTE — Consult Note (Signed)
WOC Nurse Consult Note: Reason for Consult:Right knee chronic, nonhealing full thickness wound. Patient seen by this writer last month on 06/02/22 and by my associate D. Engels on 06/08/22. Orthopedics was simultaneously consulted for the wound today and no further surgical procedure is indicated. Asked to consider NPWT vs allowing the wound to heal by secondary intention with dressings (NS wet-to-dry twice daily) or via another contemporary dressing, such as silver hydrofiber, once daily. Wound type:surgical Pressure Injury POA: N/A Measurement:Bedside RN to measure with application of next dressing change, length is not changed from previous assessment last month, but wound is no longer approximated. Wound bed:red, moist Drainage (amount, consistency, odor) moderate serous Periwound:erythematous Dressing procedure/placement/frequency: This wound is likely to heal by secondary intention using topical antimicrobial dressings once daily if applied consistently and after thoroughly cleansing the wound prior to the daily application. Additionally, the patient should present to orthopedics for scheduled follow up and complete the antibiotic therapy prescribed.  Silver hydrofiber (Aquacel Ag+ Advantage, [employer] # [id number]) is ordered for once daily changes beginning today.   I have communicated with Dr. Fran Lowes via Secure Chat my recommendations for topical care and she is in agreement.  Recommend follow up with orthopedics as directed.  WOC nursing team will not follow, but will remain available to this patient, the nursing and medical teams.  Please re-consult if needed.  Thank you for inviting [place] to participate in this patient's Plan of Care.  Ladona Mow, MSN, RN, CNS, GNP, Leda Min, [employer], [employer] phone:  [phone]

## 2022-06-28 NOTE — ED Notes (Signed)
Lab did not call this RN to notify of lactic >9. Provider aware of lactic level.

## 2022-06-28 NOTE — Progress Notes (Signed)
PHARMACY - PHYSICIAN COMMUNICATION CRITICAL VALUE ALERT - BLOOD CULTURE IDENTIFICATION (BCID)  Marco Spears is an 46 y.o. male who presented to [facility] on 06/28/2022 with a chief complaint of worsening ascites and wound over right knee  Assessment:  Septic knee/liver cirrhosis (include suspected source if known)  Name of physician (or Provider) Contacted: Dr Fran Lowes  Current antibiotics: Cefepime and Vancomycin  Changes to prescribed antibiotics recommended:  Patient is on recommended antibiotics - No changes needed at this time. Reassess Vancomycin after 24 hrs.  Results for orders placed or performed during the hospital encounter of 06/28/22  Blood Culture ID Panel (Reflexed) (Collected: 06/28/2022  2:30 AM)  Result Value Ref Range   Enterococcus faecalis NOT DETECTED NOT DETECTED   Enterococcus Faecium NOT DETECTED NOT DETECTED   Listeria monocytogenes NOT DETECTED NOT DETECTED   Staphylococcus species NOT DETECTED NOT DETECTED   Staphylococcus aureus (BCID) NOT DETECTED NOT DETECTED   Staphylococcus epidermidis NOT DETECTED NOT DETECTED   Staphylococcus lugdunensis NOT DETECTED NOT DETECTED   Streptococcus species NOT DETECTED NOT DETECTED   Streptococcus agalactiae NOT DETECTED NOT DETECTED   Streptococcus pneumoniae NOT DETECTED NOT DETECTED   Streptococcus pyogenes NOT DETECTED NOT DETECTED   A.calcoaceticus-baumannii NOT DETECTED NOT DETECTED   Bacteroides fragilis NOT DETECTED NOT DETECTED   Enterobacterales DETECTED (A) NOT DETECTED   Enterobacter cloacae complex NOT DETECTED NOT DETECTED   Escherichia coli NOT DETECTED NOT DETECTED   Klebsiella aerogenes NOT DETECTED NOT DETECTED   Klebsiella oxytoca NOT DETECTED NOT DETECTED   Klebsiella pneumoniae NOT DETECTED NOT DETECTED   Proteus species NOT DETECTED NOT DETECTED   Salmonella species NOT DETECTED NOT DETECTED   Serratia marcescens NOT DETECTED NOT DETECTED   Haemophilus influenzae NOT DETECTED NOT DETECTED    Neisseria meningitidis NOT DETECTED NOT DETECTED   Pseudomonas aeruginosa NOT DETECTED NOT DETECTED   Stenotrophomonas maltophilia NOT DETECTED NOT DETECTED   Candida albicans NOT DETECTED NOT DETECTED   Candida auris NOT DETECTED NOT DETECTED   Candida glabrata NOT DETECTED NOT DETECTED   Candida krusei NOT DETECTED NOT DETECTED   Candida parapsilosis NOT DETECTED NOT DETECTED   Candida tropicalis NOT DETECTED NOT DETECTED   Cryptococcus neoformans/gattii NOT DETECTED NOT DETECTED   CTX-M ESBL NOT DETECTED NOT DETECTED   Carbapenem resistance IMP NOT DETECTED NOT DETECTED   Carbapenem resistance KPC NOT DETECTED NOT DETECTED   Carbapenem resistance NDM NOT DETECTED NOT DETECTED   Carbapenem resist OXA 48 LIKE NOT DETECTED NOT DETECTED   Carbapenem resistance VIM NOT DETECTED NOT DETECTED    Jaydin Boniface Rodriguez-Guzman PharmD, BCPS 06/28/2022 1:08 PM

## 2022-06-28 NOTE — Consult Note (Signed)
ORTHOPAEDIC CONSULTATION  REQUESTING PHYSICIAN: Darlin Priestly, MD  Chief Complaint: right knee wound  HPI: Marco Spears is a 46 y.o. male who presented to the ER with altered mental status and was noted to have an open wound over the anterior right knee.   The patient is status post right knee septic prepatellar bursitis I&D on 7/13 and 7/27 with Dr. Martha Clan.  He was discharged on 8/1 on oral antibiotics and represented to the ER on 8/7.  A wound VAC was placed during this visit and ultimately the patient was discharged on 8/21.   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  Past Medical History:  Diagnosis Date   Cirrhosis of liver (HCC)    Heart murmur    Polysubstance abuse (HCC)    Septic bursitis    Past Surgical History:  Procedure Laterality Date   INCISION AND DRAINAGE Right 05/07/2022   Procedure: INCISION AND DRAINAGE;  Surgeon: Juanell Fairly, MD;  Location: [facility];  Service: Orthopedics;  Laterality: Right;   IRRIGATION AND DEBRIDEMENT KNEE Right 05/21/2022   Procedure: IRRIGATION AND DEBRIDEMENT KNEE;  Surgeon: Juanell Fairly, MD;  Location: [facility];  Service: Orthopedics;  Laterality: Right;   Social History   Socioeconomic History   Marital status: Single    Spouse name: Not on file   Number of children: Not on file   Years of education: Not on file   Highest education level: Not on file  Occupational History   Not on file  Tobacco Use   Smoking status: Some Days    Types: Cigarettes   Smokeless tobacco: Never  Substance and Sexual Activity   Alcohol use: Never   Drug use: Yes    Types: Marijuana, Methamphetamines, IV   Sexual activity: Not on file  Other Topics Concern   Not on file  Social History Narrative   Not on file   Social Determinants of Health   Financial Resource Strain: Not on file  Food Insecurity: Not on file  Transportation Needs: Not on file   Physical Activity: Not on file  Stress: Not on file  Social Connections: Not on file   History reviewed. No pertinent family history. Allergies  Allergen Reactions   Amoxicillin Rash    TOLERATED CEFAZOLIN PRIOR States it makes his skin turn red   Prior to Admission medications   Medication Sig Start Date End Date Taking? Authorizing Provider  B Complex-C (B-COMPLEX WITH VITAMIN C) tablet Take 1 tablet by mouth daily.   Yes [provider]  ciprofloxacin (CIPRO) 500 MG tablet Take 1 tablet (500 mg total) by mouth 2 (two) times daily for 14 days. 06/16/22 06/30/22 Yes Lynn Ito, MD  ferrous sulfate 325 (65 FE) MG tablet Take 1 tablet (325 mg total) by mouth daily with breakfast. 05/22/22  Yes Sreenath, Sudheer B, MD  folic acid (FOLVITE) 1 MG tablet Take 1 tablet (1 mg total) by mouth daily. 05/22/22  Yes Sreenath, Sudheer B, MD  furosemide (LASIX) 40 MG tablet Take 1 tablet (40 mg total) by mouth 2 (two) times daily. 05/22/22  Yes Sreenath, Sudheer B, MD  Multiple Vitamin (MULTIVITAMIN WITH MINERALS) TABS tablet Take 1 tablet by mouth daily.   Yes [provider]  spironolactone (ALDACTONE) 50 MG tablet Take 2 tablets (100 mg total) by mouth daily. 05/22/22  Yes Sreenath, Sudheer B, MD  thiamine (VITAMIN B1) 100 MG tablet Take 1 tablet (100 mg total) by mouth  daily. 06/16/22 07/16/22 Yes Lynn Ito, MD  gabapentin (NEURONTIN) 400 MG capsule Take 1 capsule (400 mg total) by mouth 3 (three) times daily for 21 days. 05/26/22 06/16/22  Tresa Moore, MD   US Venous Img Lower Bilateral (DVT)  Result Date: 06/28/2022 CLINICAL DATA:  Lower extremity edema.  Ascites. EXAM: BILATERAL LOWER EXTREMITY VENOUS DOPPLER ULTRASOUND TECHNIQUE: Gray-scale sonography with compression, as well as color and duplex ultrasound, were performed to evaluate the deep venous system(s) from the level of the common femoral vein through the popliteal and proximal calf veins. COMPARISON:  None Available.  FINDINGS: VENOUS Normal compressibility of the common femoral, superficial femoral, and popliteal veins, as well as the visualized calf veins. Visualized portions of profunda femoral vein and great saphenous vein unremarkable. No filling defects to suggest DVT on grayscale or color Doppler imaging. Doppler waveforms show normal direction of venous flow, normal respiratory plasticity and response to augmentation. OTHER Subcutaneous edema noted in the lower extremities bilaterally. Limitations: Study mildly limited due to lower extremity edema and patient discomfort. IMPRESSION: Negative for DVT in either lower extremity. Electronically Signed   By: Kennith Center M.D.   On: 06/28/2022 06:24   CT ABDOMEN PELVIS W CONTRAST  Result Date: 06/28/2022 CLINICAL DATA:  Abdominal pain, acute, nonlocalized. Alcoholic cirrhosis. EXAM: CT ABDOMEN AND PELVIS WITH CONTRAST TECHNIQUE: Multidetector CT imaging of the abdomen and pelvis was performed using the standard protocol following bolus administration of intravenous contrast. RADIATION DOSE REDUCTION: This exam was performed according to the departmental dose-optimization program which includes automated exposure control, adjustment of the mA and/or kV according to patient size and/or use of iterative reconstruction technique. CONTRAST:  OMNIPAQUE IOHEXOL 300 MG/ML  SOLN COMPARISON:  None Available. FINDINGS: Lower chest: Mild bibasilar dependent atelectasis, right greater than left. Gastroesophageal varices noted adjacent to the distal esophagus. Cardiac size within normal limits. Gynecomastia noted Hepatobiliary: The liver is shrunken and nodular in keeping with changes of cirrhosis. A slightly hyperenhancing 2 cm nodule is seen within the subserosal left hepatic lobe at axial image # 16/3, not well characterized on this examination. This may represent a dysplastic or regenerative nodule, however, a focal hepatoma is not excluded. No intra or extrahepatic biliary  ductal dilation. There is recanalization of the umbilical vein which ultimately communicates to the common femoral veins. Cholelithiasis noted without definite evidence of acute cholecystitis. Pancreas: 13 mm cystic lesion within the a head neck juncture of the pancreas is indeterminate, possibly representing a pancreatic cyst, cystic neoplasm, or dilated pancreatic side branch. The pancreas is otherwise unremarkable. Spleen: The spleen is markedly enlarged measuring up to 20 cm in greatest dimension. No intrasplenic lesions are seen Adrenals/Urinary Tract: The adrenal glands are unremarkable. The kidneys are normal. Foley catheter balloon seen within a decompressed bladder lumen. Stomach/Bowel: Small varices are seen within the gastric cardia. There is circumferential bowel wall thickening involving the gastric antrum, duodenum, and proximal jejunum which is nonspecific, but can be seen in the setting of portal venous hypertension. Inflammatory conditions, however, such as infectious gastroenteritis could appear similarly, however. There is, additionally, circumferential bowel wall thickening involving the rectum and colon with mild pericolonic inflammatory stranding, best appreciated involving the mid sigmoid colon. While changes of portal colopathy can be seen these typically preferentially affect the right colon and the extent favors the presence of an underlying infectious or inflammatory proctocolitis. No evidence of obstruction. Large volume ascites. No free intraperitoneal gas. Vascular/Lymphatic: Main portal vein is patent. 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 Mild aortoiliac atherosclerotic calcification. No aortic aneurysm. No pathologic adenopathy within the abdomen and pelvis. Reproductive: Prostate is unremarkable. Other: Small fluid containing umbilical hernia. Moderate  diffuse subcutaneous body wall edema noted. Musculoskeletal: No acute bone abnormality. No lytic or blastic bone lesion IMPRESSION: 1. Morphologic changes in keeping with cirrhosis and portal venous hypertension with marked splenomegaly, gastroesophageal varices, and portosystemic collateralization via the umbilical vein to the common femoral veins. Large volume ascites. 2. 2 cm hyperenhancing nodule within the subserosal left hepatic lobe, not well characterized on this examination. This may represent a dysplastic or regenerative nodule, however, a focal hepatoma is not excluded. Correlation with serum AFP level and/or dedicated contrast enhanced MRI examination is recommended for further evaluation. 3. Circumferential bowel wall thickening involving the gastric antrum, duodenum, proximal jejunum,, possibly representing changes of portal venous hypertension and resultant edema versus is infectious or inflammatory conditions such as infectious gastroenteritis. 4. Thickening involving the rectum and colon with mild pericolonic inflammatory stranding, best appreciated involving the mid sigmoid colon. While changes of portal colopathy can be seen in the setting of portal venous hypertension, these conditions typically preferentially affect the right colon and the extent favors the presence of an infectious or inflammatory proctocolitis. 5. 13 mm cystic lesion within the head neck juncture of the pancreas. This may represent a pancreatic cyst, cystic neoplasm, or dilated pancreatic side branch. This could be further assessed with MRI examination once the patient's acute issues have resolved. 6. Cholelithiasis. Electronically Signed   By: Helyn Numbers M.D.   On: 06/28/2022 04:11   DG Chest Portable 1 View  Result Date: 06/28/2022 CLINICAL DATA:  Dyspnea EXAM: PORTABLE CHEST 1 VIEW COMPARISON:  06/01/2022 FINDINGS: Lungs volumes are small and there is progressive elevation of the right hemidiaphragm. No pneumothorax  or pleural effusion. Cardiac size within normal limits. Pulmonary vascularity is normal. Osseous structures are age-appropriate. No acute bone abnormality. IMPRESSION: Pulmonary hypoinflation. Progressive elevation of the right hemidiaphragm. Electronically Signed   By: Helyn Numbers M.D.   On: 06/28/2022 02:48    Positive ROS: All other systems have been reviewed and were otherwise negative with the exception of those mentioned in the HPI and as above.  Physical Exam: General: Somewhat somnolent, unable to respond to questions  MUSCULOSKELETAL:   Right lower extremity: See picture of right knee, mild surrounding erythema, no significant knee effusion, tolerates gentle passive range of motion    Assessment:  46 year old male with multiple comorbidities including homelessness and polysubstance abuse, liver cirrhosis being admitted with worsening ascites, found to have dehiscence of the wound over the right knee, status post prior prepatellar bursitis I&D x 2 (7/13, 7/27) with Dr. Martha Clan   Plan: Currently, it appears that the patient has a superficial soft tissue infection related to the right knee wound.  I recommend IV antibiotics and a wound care consult for consideration of wound VAC versus wet-to-dry dressings.  Patient is a poor candidate for any repeat surgery to the right knee.  Ross Marcus, MD   06/28/2022 12:30 PM

## 2022-06-28 NOTE — ED Notes (Signed)
Pt sleeping. Wakes easily to light sounds (when RN entered room to start cefepime).

## 2022-06-28 NOTE — ED Triage Notes (Signed)
  Patient BIB EMS from home for increased fluid build up.  Patient has hepatitis induced cirrhosis and states he has fluid drawn off regularly.  Patient states it has gotten worse in the last 24 hours and has had issues voiding.  Pain 10/10.  No fevers at home that he is aware of.

## 2022-06-28 NOTE — ED Provider Notes (Signed)
[facility] Provider Note    Event Date/Time   First MD Initiated Contact with Patient 06/28/22 [phone]     (approximate)   History   Abdominal Pain   HPI  Marco Spears is a 46 y.o. male with history of polysubstance abuse, liver cirrhosis hepatitis C, previous DVT who presents to the emergency department with complaints of shortness of breath, lower extremity swelling, abdominal swelling and discomfort, vomiting that started a few days ago.  No fevers or chills.  No chest pain or cough.  No diarrhea.  Still using methamphetamine regularly.  Denies alcohol use.  States his last paracentesis was 3 weeks ago - 06/15/22 per chart.  He also reports that he has not urinated in over 24 hours.  Patient recently admitted to the hospital in August for sepsis due to cellulitis.   History provided by patient and EMS.    Past Medical History:  Diagnosis Date   Cirrhosis of liver (HCC)    Heart murmur    Polysubstance abuse (HCC)    Septic bursitis     Past Surgical History:  Procedure Laterality Date   INCISION AND DRAINAGE Right 05/07/2022   Procedure: INCISION AND DRAINAGE;  Surgeon: Juanell FairlyKrasinski, Kevin, MD;  Location: [facility] ORS;  Service: Orthopedics;  Laterality: Right;   IRRIGATION AND DEBRIDEMENT KNEE Right 05/21/2022   Procedure: IRRIGATION AND DEBRIDEMENT KNEE;  Surgeon: Juanell FairlyKrasinski, Kevin, MD;  Location: [facility] ORS;  Service: Orthopedics;  Laterality: Right;    MEDICATIONS:  Prior to Admission medications   Medication Sig Start Date End Date Taking? Authorizing Provider  B Complex-C (B-COMPLEX WITH VITAMIN C) tablet Take 1 tablet by mouth daily.    [provider]  ciprofloxacin (CIPRO) 500 MG tablet Take 1 tablet (500 mg total) by mouth 2 (two) times daily for 14 days. 06/16/22 06/30/22  Lynn ItoAmery, Sahar, MD  ferrous sulfate 325 (65 FE) MG tablet Take 1 tablet (325 mg total) by mouth daily with breakfast. 05/22/22   Tresa MooreSreenath, Sudheer B, MD  folic acid  (FOLVITE) 1 MG tablet Take 1 tablet (1 mg total) by mouth daily. 05/22/22   Tresa MooreSreenath, Sudheer B, MD  furosemide (LASIX) 40 MG tablet Take 1 tablet (40 mg total) by mouth 2 (two) times daily. 05/22/22   Tresa MooreSreenath, Sudheer B, MD  gabapentin (NEURONTIN) 400 MG capsule Take 1 capsule (400 mg total) by mouth 3 (three) times daily for 21 days. 05/26/22 06/16/22  Tresa MooreSreenath, Sudheer B, MD  Multiple Vitamin (MULTIVITAMIN WITH MINERALS) TABS tablet Take 1 tablet by mouth daily.    [provider]  spironolactone (ALDACTONE) 50 MG tablet Take 2 tablets (100 mg total) by mouth daily. 05/22/22   Tresa MooreSreenath, Sudheer B, MD  thiamine (VITAMIN B1) 100 MG tablet Take 1 tablet (100 mg total) by mouth daily. 06/16/22 07/16/22  Lynn ItoAmery, Sahar, MD    Physical Exam   Triage Vital Signs: ED Triage Vitals  Enc Vitals Group     BP 06/28/22 0155 138/77     Pulse Rate 06/28/22 0155 (!) 115     Resp 06/28/22 0155 (!) 22     Temp 06/28/22 0155 98.6 F (37 C)     Temp Source 06/28/22 0155 Oral     SpO2 06/28/22 0155 97 %     Weight 06/28/22 0156 240 lb (108.9 kg)     Height 06/28/22 0156 6\' 3"  (1.905 m)     Head Circumference --      Peak Flow --  Pain Score 06/28/22 0155 10     Pain Loc --      Pain Edu? --      Excl. in GC? --     Most recent vital signs: Vitals:   06/28/22 0245 06/28/22 0300  BP:  126/77  Pulse: (!) 112 (!) 112  Resp: (!) 27 (!) 25  Temp: 100.2 F (37.9 C) 100.1 F (37.8 C)  SpO2: 96% 95%    CONSTITUTIONAL: Alert and oriented and responds appropriately to questions.  Chronically ill-appearing, appears older than stated age, appears uncomfortable, moaning intermittently HEAD: Normocephalic, atraumatic EYES: Conjunctivae clear, pupils appear equal, sclera nonicteric ENT: normal nose; moist mucous membranes NECK: Supple, normal ROM CARD: Regular and tachycardic; S1 and S2 appreciated; no murmurs, no clicks, no rubs, no gallops RESP: Oddly tachypneic, breath sounds clear and equal  bilaterally; no wheezes, no rhonchi, no rales, no hypoxia or respiratory distress, speaking full sentences ABD/GI: Normal bowel sounds; distended with fluid wave, no significant tenderness on exam, nonperitoneal BACK: The back appears normal EXT: Normal ROM in all joints; no deformity noted, patient has pitting edema in bilateral lower extremities.  Both of his legs are red to the level of the knee and warm compared to the upper part of the legs.  He has a wound to the right knee that is not currently draining.  Extremities warm and well perfused at this time. SKIN: Normal color for age and race; warm; no rash on exposed skin NEURO: Moves all extremities equally, normal speech PSYCH: The patient's mood and manner are appropriate.     Patient gave verbal permission to utilize photo for medical documentation only. The image was not stored on any personal device.  ED Results / Procedures / Treatments   LABS: (all labs ordered are listed, but only abnormal results are displayed) Labs Reviewed  CBC WITH DIFFERENTIAL/PLATELET - Abnormal; Notable for the following components:      Result Value   RBC 3.68 (*)    Hemoglobin 11.8 (*)    HCT 36.2 (*)    RDW 17.8 (*)    Platelets 71 (*)    Lymphs Abs 0.3 (*)    All other components within normal limits  COMPREHENSIVE METABOLIC PANEL - Abnormal; Notable for the following components:   Sodium 133 (*)    CO2 21 (*)    Glucose, Bld 135 (*)    Calcium 7.9 (*)    Total Protein 5.7 (*)    Albumin 2.5 (*)    AST 73 (*)    ALT 47 (*)    Total Bilirubin 9.5 (*)    All other components within normal limits  AMMONIA - Abnormal; Notable for the following components:   Ammonia 41 (*)    All other components within normal limits  PROTIME-INR - Abnormal; Notable for the following components:   Prothrombin Time 19.5 (*)    INR 1.7 (*)    All other components within normal limits  LACTIC ACID, PLASMA - Abnormal; Notable for the following components:    Lactic Acid, Venous 3.1 (*)    All other components within normal limits  URINALYSIS, ROUTINE W REFLEX MICROSCOPIC - Abnormal; Notable for the following components:   Color, Urine AMBER (*)    APPearance HAZY (*)    Bilirubin Urine MODERATE (*)    Ketones, ur 5 (*)    Protein, ur 100 (*)    Bacteria, UA RARE (*)    All other components within normal limits  URINE  DRUG SCREEN, QUALITATIVE ([facility] ONLY) - Abnormal; Notable for the following components:   Amphetamines, Ur Screen POSITIVE (*)    Cannabinoid 50 Ng, Ur [place] POSITIVE (*)    All other components within normal limits  MAGNESIUM - Abnormal; Notable for the following components:   Magnesium 1.4 (*)    All other components within normal limits  CBG MONITORING, ED - Abnormal; Notable for the following components:   Glucose-Capillary 140 (*)    All other components within normal limits  URINE CULTURE  CULTURE, BLOOD (ROUTINE X 2)  CULTURE, BLOOD (ROUTINE X 2)  LIPASE, BLOOD  PROCALCITONIN  BRAIN NATRIURETIC PEPTIDE  ETHANOL  LACTIC ACID, PLASMA  TROPONIN I (HIGH SENSITIVITY)  TROPONIN I (HIGH SENSITIVITY)     EKG:  EKG Interpretation  Date/Time:  Sunday June 28 2022 01:57:51 EDT Ventricular Rate:  113 PR Interval:  132 QRS Duration: 105 QT Interval:  482 QTC Calculation: 661 R Axis:   14 Text Interpretation: Sinus tachycardia Anterior infarct, old Minimal ST depression, lateral leads Prolonged QT interval Confirmed by Rochele Raring [phone]) on 06/28/2022 2:23:52 AM         RADIOLOGY: My personal review and interpretation of imaging: Chest x-ray clear.  CT abdomen pelvis shows sequela of cirrhosis and large volume ascites.  Also has proctocolitis.  I have personally reviewed all radiology reports.   CT ABDOMEN PELVIS W CONTRAST  Result Date: 06/28/2022 CLINICAL DATA:  Abdominal pain, acute, nonlocalized. Alcoholic cirrhosis. EXAM: CT ABDOMEN AND PELVIS WITH CONTRAST TECHNIQUE: Multidetector CT imaging of the  abdomen and pelvis was performed using the standard protocol following bolus administration of intravenous contrast. RADIATION DOSE REDUCTION: This exam was performed according to the departmental dose-optimization program which includes automated exposure control, adjustment of the mA and/or kV according to patient size and/or use of iterative reconstruction technique. CONTRAST:  OMNIPAQUE IOHEXOL 300 MG/ML  SOLN COMPARISON:  None Available. FINDINGS: Lower chest: Mild bibasilar dependent atelectasis, right greater than left. Gastroesophageal varices noted adjacent to the distal esophagus. Cardiac size within normal limits. Gynecomastia noted Hepatobiliary: The liver is shrunken and nodular in keeping with changes of cirrhosis. A slightly hyperenhancing 2 cm nodule is seen within the subserosal left hepatic lobe at axial image # 16/3, not well characterized on this examination. This may represent a dysplastic or regenerative nodule, however, a focal hepatoma is not excluded. No intra or extrahepatic biliary ductal dilation. There is recanalization of the umbilical vein which ultimately communicates to the common femoral veins. Cholelithiasis noted without definite evidence of acute cholecystitis. Pancreas: 13 mm cystic lesion within the a head neck juncture of the pancreas is indeterminate, possibly representing a pancreatic cyst, cystic neoplasm, or dilated pancreatic side branch. The pancreas is otherwise unremarkable. Spleen: The spleen is markedly enlarged measuring up to 20 cm in greatest dimension. No intrasplenic lesions are seen Adrenals/Urinary Tract: The adrenal glands are unremarkable. The kidneys are normal. Foley catheter balloon seen within a decompressed bladder lumen. Stomach/Bowel: Small varices are seen within the gastric cardia. There is circumferential bowel wall thickening involving the gastric antrum, duodenum, and proximal jejunum which is nonspecific, but can be seen in the setting of  portal venous hypertension. Inflammatory conditions, however, such as infectious gastroenteritis could appear similarly, however. There is, additionally, circumferential bowel wall thickening involving the rectum and colon with mild pericolonic inflammatory stranding, best appreciated involving the mid sigmoid colon. While changes of portal colopathy can be seen these typically preferentially affect the right colon and the  extent favors the presence of an underlying infectious or inflammatory proctocolitis. No evidence of obstruction. Large volume ascites. No free intraperitoneal gas. Vascular/Lymphatic: Main portal vein is patent. Evidence of portal venous hypertension with gastroesophageal varices and portosystemic collateralization via the umbilical vein to the common femoral veins is noted. Mild aortoiliac atherosclerotic calcification. No aortic aneurysm. No pathologic adenopathy within the abdomen and pelvis. Reproductive: Prostate is unremarkable. Other: Small fluid containing umbilical hernia. Moderate diffuse subcutaneous body wall edema noted. Musculoskeletal: No acute bone abnormality. No lytic or blastic bone lesion IMPRESSION: 1. Morphologic changes in keeping with cirrhosis and portal venous hypertension with marked splenomegaly, gastroesophageal varices, and portosystemic collateralization via the umbilical vein to the common femoral veins. Large volume ascites. 2. 2 cm hyperenhancing nodule within the subserosal left hepatic lobe, not well characterized on this examination. This may represent a dysplastic or regenerative nodule, however, a focal hepatoma is not excluded. Correlation with serum AFP level and/or dedicated contrast enhanced MRI examination is recommended for further evaluation. 3. Circumferential bowel wall thickening involving the gastric antrum, duodenum, proximal jejunum,, possibly representing changes of portal venous hypertension and resultant edema versus is infectious or  inflammatory conditions such as infectious gastroenteritis. 4. Thickening involving the rectum and colon with mild pericolonic inflammatory stranding, best appreciated involving the mid sigmoid colon. While changes of portal colopathy can be seen in the setting of portal venous hypertension, these conditions typically preferentially affect the right colon and the extent favors the presence of an infectious or inflammatory proctocolitis. 5. 13 mm cystic lesion within the head neck juncture of the pancreas. This may represent a pancreatic cyst, cystic neoplasm, or dilated pancreatic side branch. This could be further assessed with MRI examination once the patient's acute issues have resolved. 6. Cholelithiasis. Electronically Signed   By: Helyn Numbers M.D.   On: 06/28/2022 04:11   DG Chest Portable 1 View  Result Date: 06/28/2022 CLINICAL DATA:  Dyspnea EXAM: PORTABLE CHEST 1 VIEW COMPARISON:  06/01/2022 FINDINGS: Lungs volumes are small and there is progressive elevation of the right hemidiaphragm. No pneumothorax or pleural effusion. Cardiac size within normal limits. Pulmonary vascularity is normal. Osseous structures are age-appropriate. No acute bone abnormality. IMPRESSION: Pulmonary hypoinflation. Progressive elevation of the right hemidiaphragm. Electronically Signed   By: Helyn Numbers M.D.   On: 06/28/2022 02:48     PROCEDURES:  Critical Care performed: Yes, see critical care procedure note(s)   CRITICAL CARE Performed by: Baxter Hire San Lohmeyer   Total critical care time: 45 minutes  Critical care time was exclusive of separately billable procedures and treating other patients.  Critical care was necessary to treat or prevent imminent or life-threatening deterioration.  Critical care was time spent personally by me on the following activities: development of treatment plan with patient and/or surrogate as well as nursing, discussions with consultants, evaluation of patient's response to  treatment, examination of patient, obtaining history from patient or surrogate, ordering and performing treatments and interventions, ordering and review of laboratory studies, ordering and review of radiographic studies, pulse oximetry and re-evaluation of patient's condition.   Marland Kitchen1-3 Lead EKG Interpretation  Performed by: French Kendra, Layla Maw, DO Authorized by: Anyi Fels, Layla Maw, DO     Interpretation: abnormal     ECG rate:  115   ECG rate assessment: tachycardic     Rhythm: sinus tachycardia     Ectopy: none     Conduction: normal       IMPRESSION / MDM / ASSESSMENT AND PLAN / ED COURSE  I reviewed the triage vital signs and the nursing notes.    Patient here with complaints of shortness of breath, abdominal pain and distention, vomiting.  Tachycardic, tachypneic.  Also reports no urine output in 24 hours.  The patient is on the cardiac monitor to evaluate for evidence of arrhythmia and/or significant heart rate changes.   DIFFERENTIAL DIAGNOSIS (includes but not limited to):   Sepsis, bacteremia, cellulitis on exam, pneumonia, ascites, SBP, CHF, PE, DVT   Patient's presentation is most consistent with acute presentation with potential threat to life or bodily function.   PLAN: We will obtain CBC, CMP, lipase, troponin, BNP, bilateral lower extremity Dopplers, urinalysis, urine culture, chest x-ray, CT of the abdomen pelvis.  Patient reports he has not made any urine for 24 hours.  Low utility in obtaining a bladder scan due to his significant ascites here.  I am concerned for possible retention versus patient being an uric.  Foley catheter was placed with only a small output of very dark urine.  He is getting IV fluids here.  Temp Foley shows temperature of 100.3.  Will give ibuprofen.  Starting broad-spectrum antibiotics for sepsis, cellulitis.  He has cellulitis in both lower extremities and a wound to the right knee.  He did have an MRI of this right knee on 06/01/2022   MEDICATIONS  GIVEN IN ED: Medications  vancomycin (VANCOCIN) IVPB 1000 mg/200 mL premix (0 mg Intravenous Stopped 06/28/22 0420)    Followed by  vancomycin (VANCOREADY) IVPB 1500 mg/300 mL (1,500 mg Intravenous New Bag/Given 06/28/22 0416)  ibuprofen (ADVIL) tablet 800 mg (has no administration in time range)  lactated ringers bolus 1,500 mL (has no administration in time range)  magnesium sulfate IVPB 2 g 50 mL (has no administration in time range)  metroNIDAZOLE (FLAGYL) IVPB 500 mg (has no administration in time range)  morphine (PF) 4 MG/ML injection 4 mg (4 mg Intravenous Given 06/28/22 0215)  ondansetron (ZOFRAN) injection 4 mg (4 mg Intravenous Given 06/28/22 0214)  ceFEPIme (MAXIPIME) 2 g in sodium chloride 0.9 % 100 mL IVPB (0 g Intravenous Stopped 06/28/22 0402)  iohexol (OMNIPAQUE) 300 MG/ML solution 100 mL (100 mLs Intravenous Contrast Given 06/28/22 0314)  lactated ringers bolus 2,000 mL (2,000 mLs Intravenous New Bag/Given 06/28/22 0418)  albumin human 25 % solution 12.5 g (12.5 g Intravenous New Bag/Given 06/28/22 0421)     ED COURSE: Patient's labs show no leukocytosis improving anemia.  Elevated liver function test.  AST and ALT similar to previous but now total bilirubin is 9.5.  Ammonia level of 41.  INR is 1.7.  Lactic 3.1 and procalcitonin 1.02.  He is receiving 30 mg/kg IV fluid bolus.  He is receiving broad-spectrum antibiotics.  Troponin negative.  BNP normal.  Urine does not appear infected.  He seems like he is somewhat an uric as he only put out about 100 mL when Foley catheter was placed.  We will leave Foley catheter in place to closely monitor his ins and outs.  Drug screen positive for amphetamines and cannabinoids but negative for ethanol.  His EKG does show a prolonged QT interval that is new compared to previous.  Potassium is normal but magnesium is 1.4.  We will give IV magnesium replacement.  Chest x-ray reviewed and interpreted by myself and the radiologist and shows no acute abnormality.   CT of the abdomen pelvis reviewed by myself and the radiologist and shows cirrhosis, portal venous hypertension, splenomegaly, gastroesophageal varices and large volume ascites.  Patient has cholelithiasis but no signs of ductal dilatation.  His MELD score is 24.  CT also concerning for infectious versus inflammatory proctocolitis.  I suspect this along with cellulitis of his legs is what is causing his sepsis today.  Will discuss with the hospitalist for admission.   CONSULTS:  Consulted and discussed patient's case with hospitalist, Dr. Maisie Fus.  I have recommended admission and consulting physician agrees and will place admission orders.  Patient (and family if present) agree with this plan.   I reviewed all nursing notes, vitals, pertinent previous records.  All labs, EKGs, imaging ordered have been independently reviewed and interpreted by myself.    OUTSIDE RECORDS REVIEWED: Reviewed patient's last admission in August 2023.       FINAL CLINICAL IMPRESSION(S) / ED DIAGNOSES   Final diagnoses:  Bilateral cellulitis of lower leg  Ascites due to alcoholic cirrhosis (HCC)  Hypomagnesemia  Prolonged Q-T interval on ECG  Proctocolitis     Rx / DC Orders   ED Discharge Orders     None        Note:  This document was prepared using Dragon voice recognition software and may include unintentional dictation errors.   Arlee Santosuosso, Layla Maw, DO 06/28/22 [phone]

## 2022-06-28 NOTE — Progress Notes (Signed)
PHARMACY -  BRIEF ANTIBIOTIC NOTE   Pharmacy has received consult(s) for Vanc, Cefepime from an ED provider.  The patient's profile has been reviewed for ht/wt/allergies/indication/available labs.    One time order(s) placed for Vancomycin 2500 mg IV X 1 and Cefepime 2 gm IV X 1.   Further antibiotics/pharmacy consults should be ordered by admitting physician if indicated.                       Thank you, Charlisha Market D 06/28/2022  2:31 AM

## 2022-06-29 LAB — CBC
HCT: 27.3 % — ABNORMAL LOW (ref 39.0–52.0)
Hemoglobin: 8.8 g/dL — ABNORMAL LOW (ref 13.0–17.0)
MCH: 32.1 pg (ref 26.0–34.0)
MCHC: 32.2 g/dL (ref 30.0–36.0)
MCV: 99.6 fL (ref 80.0–100.0)
Platelets: 37 10*3/uL — ABNORMAL LOW (ref 150–400)
RBC: 2.74 MIL/uL — ABNORMAL LOW (ref 4.22–5.81)
RDW: 18.3 % — ABNORMAL HIGH (ref 11.5–15.5)
WBC: 4.3 10*3/uL (ref 4.0–10.5)
nRBC: 0 % (ref 0.0–0.2)

## 2022-06-29 LAB — COMPREHENSIVE METABOLIC PANEL
ALT: 28 U/L (ref 0–44)
AST: 36 U/L (ref 15–41)
Albumin: 2.1 g/dL — ABNORMAL LOW (ref 3.5–5.0)
Alkaline Phosphatase: 71 U/L (ref 38–126)
Anion gap: 3 — ABNORMAL LOW (ref 5–15)
BUN: 15 mg/dL (ref 6–20)
CO2: 25 mmol/L (ref 22–32)
Calcium: 7.5 mg/dL — ABNORMAL LOW (ref 8.9–10.3)
Chloride: 103 mmol/L (ref 98–111)
Creatinine, Ser: 0.69 mg/dL (ref 0.61–1.24)
GFR, Estimated: >60 mL/min (ref >60)
Glucose, Bld: 124 mg/dL — ABNORMAL HIGH (ref 70–99)
Potassium: 3.4 mmol/L — ABNORMAL LOW (ref 3.5–5.1)
Sodium: 131 mmol/L — ABNORMAL LOW (ref 135–145)
Total Bilirubin: 5.4 mg/dL — ABNORMAL HIGH (ref 0.3–1.2)
Total Protein: 4.6 g/dL — ABNORMAL LOW (ref 6.5–8.1)

## 2022-06-29 LAB — URINE CULTURE: Culture: NO GROWTH

## 2022-06-29 LAB — PROTEIN, BODY FLUID (OTHER): Total Protein, Body Fluid Other: 1.1 g/dL

## 2022-06-29 LAB — MAGNESIUM: Magnesium: 1.6 mg/dL — ABNORMAL LOW (ref 1.7–2.4)

## 2022-06-29 LAB — PHOSPHORUS: Phosphorus: 2.2 mg/dL — ABNORMAL LOW (ref 2.5–4.6)

## 2022-06-29 LAB — CBG MONITORING, ED: Glucose-Capillary: 115 mg/dL — ABNORMAL HIGH (ref 70–99)

## 2022-06-29 MED ORDER — METHOCARBAMOL 500 MG PO TABS
750.0000 mg | ORAL_TABLET | Freq: Three times a day (TID) | ORAL | Status: DC | PRN
  Administered 2022-06-29 – 2022-07-02 (×4): 750 mg via ORAL
  Filled 2022-06-29 (×4): qty 2

## 2022-06-29 MED ORDER — POTASSIUM CHLORIDE CRYS ER 20 MEQ PO TBCR
40.0000 meq | EXTENDED_RELEASE_TABLET | Freq: Once | ORAL | Status: AC
  Administered 2022-06-29: 40 meq via ORAL
  Filled 2022-06-29: qty 2

## 2022-06-29 MED ORDER — ACETAMINOPHEN 325 MG PO TABS
325.0000 mg | ORAL_TABLET | Freq: Once | ORAL | Status: AC
  Administered 2022-06-29: 325 mg via ORAL
  Filled 2022-06-29: qty 1

## 2022-06-29 MED ORDER — SODIUM CHLORIDE 0.9 % IV BOLUS
1000.0000 mL | Freq: Once | INTRAVENOUS | Status: AC
  Administered 2022-06-29: 1000 mL via INTRAVENOUS

## 2022-06-29 MED ORDER — MAGNESIUM SULFATE 2 GM/50ML IV SOLN
2.0000 g | Freq: Once | INTRAVENOUS | Status: AC
  Administered 2022-06-29: 2 g via INTRAVENOUS
  Filled 2022-06-29: qty 50

## 2022-06-29 MED ORDER — SODIUM PHOSPHATES 45 MMOLE/15ML IV SOLN
30.0000 mmol | Freq: Once | INTRAVENOUS | Status: AC
  Administered 2022-06-29: 30 mmol via INTRAVENOUS
  Filled 2022-06-29: qty 10

## 2022-06-29 MED ORDER — ADULT MULTIVITAMIN W/MINERALS CH
1.0000 | ORAL_TABLET | Freq: Every day | ORAL | Status: DC
  Administered 2022-06-30 – 2022-07-03 (×4): 1 via ORAL
  Filled 2022-06-29 (×4): qty 1

## 2022-06-29 MED ORDER — ENSURE ENLIVE PO LIQD
237.0000 mL | Freq: Three times a day (TID) | ORAL | Status: DC
  Administered 2022-06-29 – 2022-07-03 (×10): 237 mL via ORAL

## 2022-06-29 MED ORDER — METHOCARBAMOL 750 MG PO TABS
750.0000 mg | ORAL_TABLET | Freq: Once | ORAL | Status: AC
  Administered 2022-06-29: 750 mg via ORAL
  Filled 2022-06-29: qty 1

## 2022-06-29 NOTE — Progress Notes (Addendum)
       CROSS COVER NOTE  NAME: Marco Spears MRN: [id number] DOB : [birth year]    Date of Service   06/29/2022   HPI/Events of Note   Patient medication request received for Robaxin for 5/10 chronic lower back pain.  Patient also with elevated temperature this AM-->38.3C  Interventions   Plan: Robaxin 750mg  PO x1  Acetaminophen 325mg      This document was prepared using Dragon voice recognition software and may include unintentional dictation errors.  DNP, MHA, FNP-BC Nurse Practitioner [facility] Pager [phone]

## 2022-06-29 NOTE — ED Notes (Signed)
Pt given turkey sandwich and orange juice

## 2022-06-29 NOTE — ED Notes (Signed)
Writer replaced dressing on right knee wound. Cleaned wound with iodine swab, placed vaseline dressing and nonadherent dressing.

## 2022-06-29 NOTE — Progress Notes (Addendum)
PROGRESS NOTE    Marco Spears  ZJQ:[id number] DOB: 04-14-1976 DOA: 06/28/2022 Marco Spears: Marco Spears, Marco Spears  [id number]  LOS: 1 day   Brief hospital course: Marco Spears notes on file  Assessment & Plan: Marco Spears is a 46 y.o. male with medical history significant of  polysubstance abuse, etoh liver disease with cirrhosis, hep C s/p tx , DVT s/p tx with Eliquis,history of b/l lower extremity cellulitis as well as right knee infection, who has recent hospitalization 8/7-8/21 with discharge diagnoses of  Septic arthritis of right Knee, ESBL  E coli bacteremia for which  patient was treated with meropenem and completed course 06/15/22. Patient discharge complicated by patient history of noncompliance and it was noted that he was a high risk for progression of infection due to his noncompliance. Patient now returns to ED BIBEMS with complaint of increase fluid. He notes that his abdomen has gotten mores swollen in the last 24 hour. He also has complaint of urinary retention.   Severe Sepsis  --fever, tachycardia, elevated lactic acid, source bacteremia and likely SBP --received vanc/cefe/flagyl and IVF in the ED.  Bacteremia from Enterobacterales --4/4 bottles pos, not ESBL --cont cefepime  SBP --fever, large ascites, abdominal pain --US paracentesis 9/3 with 7L removed.  Fluid studies consistent with SBP --cont cefepime  Possible cellulitis around right knee Hx of prepatellar bursitis I&D x 2 (7/13, 7/27) with Marco Spears --Pt refused wound VAC application and was non-compliant with abx after discharge from last hospitalization --ortho consulted, rec wound care and IV abx Plan: --cont IV vanc and cefe --wound care per order   Proctocolitis --as per imaging. --already on broad-spectrum abx for other infections  Cirrhosis with tense ascites/fluid overload -patient admits to be being non-compliant with his medications   Acute Metabolic Encephalopathy -multifactorial / sepsis/ hepatic  encephalopathy  Anemia of chronic disease --recent anemia workup neg for def in iron, folate and Vit B12 -continue to monitor h/h    Polysubstance abuse + amphetamine/marijuana   Hypomag --replete with IV mag  Chronic thrombocytopenia  --due to cirrhosis   DVT prophylaxis: SCD/Compression stockings Code Status: Full code  Family Communication:  Level of care: Progressive Dispo:   The patient is from: home Anticipated d/c is to: undetermined Anticipated d/c date is: undetermined   Subjective and Interval History:  Pt reported abdominal pain improved.  Still very lethargic and barely answered questions.   Objective: Vitals:   06/29/22 0700 06/29/22 0800 06/29/22 1000 06/29/22 1200  BP: (!) 92/54 (!) 83/56 112/63 115/60  Pulse: 93 94 98 96  Resp: (!) 24 (!) 23 (!) 24 (!) 22  Temp: (!) 100.9 F (38.3 C) (!) 101 F (38.3 C) 100 F (37.8 C) 98.9 F (37.2 C)  TempSrc:    Oral  SpO2: 100% 98% 94% 96%  Weight:      Height:        Intake/Output Summary (Last 24 hours) at 06/29/2022 1352 Last data filed at 06/29/2022 0729 Gross per 24 hour  Intake 500 ml  Output 1400 ml  Net -900 ml   Filed Weights   06/28/22 0156  Weight: 108.9 kg    Examination:   Constitutional: NAD, lethargic, oriented CV: Marco Spears cyanosis.   RESP: normal respiratory effort, on RA Ab: abdomen large but less tense  Extremities: edema in BLE, dressing over right knee SKIN: warm, dry   Data Reviewed: I have personally reviewed labs and imaging studies  Time spent: 50 minutes  Marco Priestly, MD [facility] If  7PM-7AM, please contact night-coverage 06/29/2022, 1:52 PM

## 2022-06-29 NOTE — ED Notes (Addendum)
Writer messaged Bishop Limbo, NP notifying of 100.63F temperature. No new orders at this time.

## 2022-06-29 NOTE — TOC Initial Note (Signed)
Transition of Care [facility]) - Initial/Assessment Note    Patient Details  Name: Marco Spears MRN: [id number] Date of Birth: Mar 12, 1976  Transition of Care [facility]) CM/SW Contact:    Durenda Guthrie, RN Phone Number: 06/29/2022, 4:03 PM  Clinical Narrative:                 Transition of Care Screening Note:  Transition of Care Department [facility]) has reviewed patient and no TOC needs have been identified at this time. We will continue to monitor patient advancement through Interdisciplinary progressions. If new patient transition needs arise, please place a consult.          Patient Goals and CMS Choice        Expected Discharge Plan and Services                                                Prior Living Arrangements/Services                       Activities of Daily Living Home Assistive Devices/Equipment: Eyeglasses ADL Screening (condition at time of admission) Patient's cognitive ability adequate to safely complete daily activities?: Yes Is the patient deaf or have difficulty hearing?: No Does the patient have difficulty seeing, even when wearing glasses/contacts?: No Does the patient have difficulty concentrating, remembering, or making decisions?: No Patient able to express need for assistance with ADLs?: Yes Does the patient have difficulty dressing or bathing?: Yes Independently performs ADLs?: Yes (appropriate for developmental age) Does the patient have difficulty walking or climbing stairs?: Yes Weakness of Legs: Both Weakness of Arms/Hands: None  Permission Sought/Granted                  Emotional Assessment              Admission diagnosis:  Proctocolitis [K52.9] Hypomagnesemia [E83.42] Prolonged Q-T interval on ECG [R94.31] Bilateral cellulitis of lower leg [L03.116, L03.115] Sepsis (HCC) [A41.9] Ascites due to alcoholic cirrhosis (HCC) [K70.31] Patient Active Problem List   Diagnosis Date Noted   Malnutrition of  moderate degree 06/11/2022   Portal hypertension (HCC) 06/02/2022   Bacteremia 06/02/2022   AMS (altered mental status) 06/01/2022   Thrombocytopenia (HCC) 06/01/2022   Septic bursitis 05/22/2022   Septic arthritis (HCC) 05/21/2022   Alcohol abuse 05/21/2022   On anticoagulant therapy 05/21/2022   Hypocalcemia 05/21/2022   Tongue lesion 05/21/2022   Cellulitis of right leg    Cirrhosis of liver without ascites (HCC)    Polysubstance abuse (HCC)    Cellulitis 05/05/2022   Sepsis (HCC) 05/05/2022   Alcoholic cirrhosis of liver with ascites (HCC) 04/21/2022   Hypokalemia 04/21/2022   Anemia 04/21/2022   PCP:  Pcp, No Pharmacy:   [facility] [address] [place] [address] Phone: [phone] Fax: [phone]     Social Determinants of Health (SDOH) Interventions    Readmission Risk Interventions    05/06/2022    2:26 PM  Readmission Risk Prevention Plan  Transportation Screening Complete  Medication Review (RN Care Manager) Complete

## 2022-06-29 NOTE — Progress Notes (Signed)
Initial Nutrition Assessment  DOCUMENTATION CODES:   Non-severe (moderate) malnutrition in context of chronic illness  INTERVENTION:   Ensure Enlive po TID, each supplement provides 350 kcal and 20 grams of protein.  MVI po daily   Pt at high refeed risk; recommend monitor potassium, magnesium and phosphorus labs daily until stable  NUTRITION DIAGNOSIS:   Moderate Malnutrition related to chronic illness (cirrhosis, etoh and substance abuse) as evidenced by moderate fat depletion, moderate muscle depletion.  GOAL:   Patient will meet greater than or equal to 90% of their needs  MONITOR:   PO intake, Supplement acceptance, Labs, Weight trends, Skin, I & O's  REASON FOR ASSESSMENT:   Malnutrition Screening Tool    ASSESSMENT:   46 year old male with h/o homelessness, etoh and polysubstance abuse, liver cirrhosis with ascites and recent hospitalization 8/7-8/21 with septic arthritis of right knee with ESBL E coli bacteremia who is now admitted with SBP and proctocolitis.  Pt s/p paracentesis 9/3 with 5.0L output  Met with pt in room today. Pt is lethargic today. Pt is familiar to this RD from his recent previous admission. Pt with good appetite and oral intake at baseline but reports homelessness and food insecurity pta. Pt with good oral intake during his last admission; pt eating 100% of meals. Pt drinks vanilla or strawberry supplements. RD will add supplements and MVI to help pt meet his estimated needs. Pt is at high refeed risk. Pt educated on low sodium/high protein diet during his last admission. Per chart, pt appears weight stable since his last admission; there is no recent documented weight history prior to June in chart to determine if any significant recent weight changes.   Medications reviewed and include: cefepime, vancomycin, Na phosphate   Labs reviewed: Na 131(L), K 3.4(L), P 2.2(L), Mg 1.6(L) Ammonia 41(H)- 9/3 Hgb 8.8(L), Hct 27.3(L) Cbgs- 115, 104, 140 x  48 hrs  NUTRITION - FOCUSED PHYSICAL EXAM:  Flowsheet Row Most Recent Value  Orbital Region Mild depletion  Upper Arm Region Severe depletion  Thoracic and Lumbar Region Moderate depletion  Buccal Region No depletion  Temple Region Mild depletion  Clavicle Bone Region Moderate depletion  Clavicle and Acromion Bone Region Moderate depletion  Scapular Bone Region Moderate depletion  Dorsal Hand Mild depletion  Patellar Region Unable to assess  Anterior Thigh Region Unable to assess  Posterior Calf Region Unable to assess  Edema (RD Assessment) Moderate  Hair Reviewed  Eyes Reviewed  Mouth Reviewed  Skin Reviewed  Nails Reviewed   Diet Order:   Diet Order             Diet 2 gram sodium Room service appropriate? Yes; Fluid consistency: Thin  Diet effective now                  EDUCATION NEEDS:   Education needs have been addressed  Skin:  Skin Assessment: Reviewed RN Assessment (closed incision R knee, ecchymosis)  Last BM:  pta  Height:   Ht Readings from Last 1 Encounters:  06/28/22 6' 3"  (1.905 m)    Weight:   Wt Readings from Last 1 Encounters:  06/28/22 108.9 kg    Ideal Body Weight:  89 kg  BMI:  Body mass index is 30 kg/m.  Estimated Nutritional Needs:   Kcal:  2700-3000kcal/day  Protein:  >135g/day  Fluid:  2.0L/day  Koleen Distance MS, RD, LDN Please refer to [facility] for RD and/or RD on-call/weekend/after hours pager

## 2022-06-30 ENCOUNTER — Inpatient Hospital Stay: Payer: Self-pay | Admitting: Infectious Diseases

## 2022-06-30 ENCOUNTER — Inpatient Hospital Stay: Payer: Medicaid Other

## 2022-06-30 LAB — CBC
HCT: 28.8 % — ABNORMAL LOW (ref 39.0–52.0)
Hemoglobin: 9.5 g/dL — ABNORMAL LOW (ref 13.0–17.0)
MCH: 31.8 pg (ref 26.0–34.0)
MCHC: 33 g/dL (ref 30.0–36.0)
MCV: 96.3 fL (ref 80.0–100.0)
Platelets: 48 10*3/uL — ABNORMAL LOW (ref 150–400)
RBC: 2.99 MIL/uL — ABNORMAL LOW (ref 4.22–5.81)
RDW: 17.9 % — ABNORMAL HIGH (ref 11.5–15.5)
WBC: 5.2 10*3/uL (ref 4.0–10.5)
nRBC: 0 % (ref 0.0–0.2)

## 2022-06-30 LAB — BASIC METABOLIC PANEL
Anion gap: 6 (ref 5–15)
BUN: 11 mg/dL (ref 6–20)
CO2: 21 mmol/L — ABNORMAL LOW (ref 22–32)
Calcium: 7.5 mg/dL — ABNORMAL LOW (ref 8.9–10.3)
Chloride: 104 mmol/L (ref 98–111)
Creatinine, Ser: 0.62 mg/dL (ref 0.61–1.24)
GFR, Estimated: >60 mL/min (ref >60)
Glucose, Bld: 145 mg/dL — ABNORMAL HIGH (ref 70–99)
Potassium: 3.4 mmol/L — ABNORMAL LOW (ref 3.5–5.1)
Sodium: 131 mmol/L — ABNORMAL LOW (ref 135–145)

## 2022-06-30 LAB — CULTURE, BLOOD (ROUTINE X 2): Special Requests: ADEQUATE

## 2022-06-30 LAB — PHOSPHORUS: Phosphorus: 1.7 mg/dL — ABNORMAL LOW (ref 2.5–4.6)

## 2022-06-30 LAB — GLUCOSE, CAPILLARY: Glucose-Capillary: 144 mg/dL — ABNORMAL HIGH (ref 70–99)

## 2022-06-30 LAB — VANCOMYCIN, PEAK: Vancomycin Pk: 31 ug/mL (ref 30–40)

## 2022-06-30 LAB — MAGNESIUM: Magnesium: 1.8 mg/dL (ref 1.7–2.4)

## 2022-06-30 MED ORDER — MORPHINE SULFATE 15 MG PO TABS
15.0000 mg | ORAL_TABLET | Freq: Four times a day (QID) | ORAL | Status: DC | PRN
  Administered 2022-06-30 – 2022-07-01 (×3): 15 mg via ORAL
  Filled 2022-06-30 (×4): qty 1

## 2022-06-30 MED ORDER — CHLORHEXIDINE GLUCONATE CLOTH 2 % EX PADS
6.0000 | MEDICATED_PAD | Freq: Every day | CUTANEOUS | Status: DC
  Administered 2022-06-30 – 2022-07-03 (×4): 6 via TOPICAL

## 2022-06-30 MED ORDER — OXYCODONE-ACETAMINOPHEN 5-325 MG PO TABS
1.0000 | ORAL_TABLET | Freq: Once | ORAL | Status: AC
  Administered 2022-06-30: 1 via ORAL
  Filled 2022-06-30: qty 1

## 2022-06-30 MED ORDER — ALBUMIN HUMAN 25 % IV SOLN
50.0000 g | Freq: Once | INTRAVENOUS | Status: AC
  Administered 2022-06-30: 50 g via INTRAVENOUS
  Filled 2022-06-30: qty 200

## 2022-06-30 MED ORDER — SPIRONOLACTONE 25 MG PO TABS
100.0000 mg | ORAL_TABLET | Freq: Every day | ORAL | Status: DC
  Administered 2022-06-30 – 2022-07-03 (×4): 100 mg via ORAL
  Filled 2022-06-30 (×4): qty 4

## 2022-06-30 MED ORDER — POTASSIUM PHOSPHATES 15 MMOLE/5ML IV SOLN
30.0000 mmol | Freq: Once | INTRAVENOUS | Status: AC
  Administered 2022-06-30: 30 mmol via INTRAVENOUS
  Filled 2022-06-30: qty 10

## 2022-06-30 MED ORDER — FUROSEMIDE 40 MG PO TABS
40.0000 mg | ORAL_TABLET | Freq: Two times a day (BID) | ORAL | Status: DC
  Administered 2022-06-30 – 2022-07-01 (×2): 40 mg via ORAL
  Filled 2022-06-30 (×2): qty 1

## 2022-06-30 NOTE — Progress Notes (Signed)
PROGRESS NOTE    Marco Spears  QDI:[id number] DOB: [birth year] DOA: 06/28/2022 PCP: Pcp, No  204A/204A-AA  LOS: 2 days   Brief hospital course: No notes on file  Assessment & Plan: Marco Spears is a 46 y.o. male with medical history significant of  polysubstance abuse, etoh liver disease with cirrhosis, hep C s/p tx , DVT s/p tx with Eliquis, history of b/l lower extremity cellulitis as well as right knee infection, who has recent hospitalization 8/7-8/21 for septic arthritis of right Knee, ESBL E coli bacteremia and SBP for which patient was treated with meropenem.  Patient discharge complicated by patient history of noncompliance and it was noted that he was a high risk for progression of infection due to his noncompliance. Patient now returns to ED BIBEMS with complaint of increase fluid. He notes that his abdomen has gotten more swollen in the last 24 hour. He also has complaint of urinary retention.   Severe Sepsis  --fever, tachycardia, elevated lactic acid, source bacteremia and SBP --received vanc/cefe/flagyl and IVF in the ED.  Bacteremia E coli --4/4 bottles pos, not ESBL this time --cont cefepime  SBP --fever, large ascites, abdominal pain --US paracentesis 9/3 with 7L removed.  Fluid studies consistent with SBP --cont cefepime  Decompensated Cirrhosis with  Recurrent ascites Medication non-compliant  --US paracentesis 9/3 with 7L removed.  Fluid studies consistent with SBP Plan: --US paracentesis again today with 7.5L removed, with IV albumin order. --resume lasix and aldactone --palliative consult   Possible cellulitis around right knee Hx of prepatellar bursitis I&D x 2 (7/13, 7/27) with Dr. Martha Clan --Pt refused wound VAC application and was non-compliant with abx after discharge from last hospitalization --ortho consulted, rec wound care and IV abx Plan: --cont IV vanc and cefe --wound care per order   Proctocolitis --as per imaging. --already on  broad-spectrum abx for other infections   Acute Metabolic Encephalopathy -multifactorial / sepsis/ hepatic encephalopathy  Anemia of chronic disease --recent anemia workup neg for def in iron, folate and Vit B12 -continue to monitor h/h    Polysubstance abuse + amphetamine/marijuana   Hypomag --replete with IV mag  Chronic thrombocytopenia  --due to cirrhosis   DVT prophylaxis: SCD/Compression stockings Code Status: Full code  Family Communication:  Level of care: Med-Surg Dispo:   The patient is from: homeless Anticipated d/c is to: undetermined Anticipated d/c date is: undetermined   Subjective and Interval History:  Pt reported increasing abdominal pain and distention today.  US paracentesis ordered again today with another 7.5L out.  IV albumin ordered.   Objective: Vitals:   06/30/22 1105 06/30/22 1106 06/30/22 1523 06/30/22 1733  BP: 130/74  128/74 118/67  Pulse: (!) 107  (!) 102 90  Resp: (!) 24 20 20 18   Temp: 98.4 F (36.9 C)  99.8 F (37.7 C) 98.8 F (37.1 C)  TempSrc:    Oral  SpO2: 95%  97% 100%  Weight:      Height:        Intake/Output Summary (Last 24 hours) at 06/30/2022 1859 Last data filed at 06/30/2022 1500 Gross per 24 hour  Intake 2126.04 ml  Output 2950 ml  Net -823.96 ml   Filed Weights   06/28/22 0156 06/30/22 0500  Weight: 108.9 kg 123.9 kg    Examination:   Constitutional: NAD, AAOx3 HEENT: conjunctivae and lids normal, EOMI CV: No cyanosis.   RESP: normal respiratory effort, on RA Ab:  abdomen distended, and tender to palpation Extremities: edema in  BLE SKIN: warm, dry Neuro: II - XII grossly intact.     Data Reviewed: I have personally reviewed labs and imaging studies  Time spent: 50 minutes  Darlin Priestly, MD [facility] If 7PM-7AM, please contact night-coverage 06/30/2022, 6:59 PM

## 2022-06-30 NOTE — Progress Notes (Signed)
Patient requesting paracentesis. Complaining of abdominal pain and tightness. Dr. Fran Lowes notified.

## 2022-06-30 NOTE — Progress Notes (Signed)
Pharmacy Antibiotic Note  Marco Spears is a 46 y.o. male w/ PMH of polysubstance abuse, etoh liver disease with cirrhosis, hep C s/p tx , DVT s/p tx with Eliquis,history of b/l lower extremity cellulitis as well as right knee infection admitted on 06/28/2022 with cellulitis.  Pharmacy has been consulted for vancomycin dosing. He received 2500 mg IV vancomycin in the ED  Plan: 9/5: Patient remains on Vancomycin with no end date determined yet. Noted 5 doses given. Appropriate to order vancomycin level around next dose on 9/5 @ 1800  Vancomycin 1500mg  IV q12h based on adjusting CrCl to expect value based on age AUC goal 400-600 Predicted AUC 425 using SCr rounded to 1 mg/dl (est CrCl using this SCr = 108 mcg/mL) Monitor renal function and need to check vancomycin levels   Height: 6\' 3"  (190.5 cm) Weight: 123.9 kg (273 lb 3.2 oz) IBW/kg (Calculated) : 84.5  Temp (24hrs), Avg:99.7 F (37.6 C), Min:98.5 F (36.9 C), Max:100.7 F (38.2 C)  Recent Labs  Lab 06/28/22 0214 06/28/22 0603 06/28/22 0650 06/28/22 0748 06/29/22 0502 06/30/22 0454  WBC 5.4  --   --  5.3 4.3 5.2  CREATININE 0.69  --   --  0.58* 0.69 0.62  LATICACIDVEN 3.1* 3.4* >9.0* 3.2*  --   --      Estimated Creatinine Clearance: 160.2 mL/min (by C-G formula based on SCr of 0.62 mg/dL).    Allergies  Allergen Reactions   Amoxicillin Rash    TOLERATED CEFAZOLIN PRIOR States it makes his skin turn red    Antimicrobials this admission: 09/03 vancomycin >>  09/03 cefepime >>    Microbiology results: 09/03 BCx: E.coli 09/03 UCx: NGx5   Thank you for allowing pharmacy to be a part of this patient's care.  Herley Bernardini Rodriguez-Guzman PharmD, BCPS 06/30/2022 9:56 AM

## 2022-06-30 NOTE — Progress Notes (Signed)
       CROSS COVER NOTE  NAME: Marco Spears MRN: [id number] DOB : [birth year]    Date of Service   06/30/2022   HPI/Events of Note   Medication request received for 10/10 pain at paracentesis site and temp of 100.4.  Interventions   Plan: Percocet 5-325mg       This document was prepared using Dragon voice recognition software and may include unintentional dictation errors.  Bishop Limbo DNP, MHA, FNP-BC Nurse Practitioner [facility] Pager [phone]

## 2022-06-30 NOTE — TOC Initial Note (Signed)
Transition of Care [facility]) - Initial/Assessment Note    Patient Details  Name: Marco Spears MRN: [id number] Date of Birth: July 28, 1976  Transition of Care [facility]) CM/SW Contact:    Gildardo Griffes, LCSW Phone Number: 06/30/2022, 11:25 AM  Clinical Narrative:                  CSW spoke with patient at bedside to complete readmission risk prevention, he confirms not currently having a PCP but had planned on going to [facility] prior to his admission. Patient reports he has [facility] paperwork, CSW provided extra copy at bedside. Patient reports he was staying at his brothers prior to admission and may be able to go back to his brothers, if not he is aware of shelters. Patient is currently Medicaid Potential insurance pending medicaid, no insurance. Meds to be sent to medication management at time of discharge.    Expected Discharge Plan: Home/Self Care Barriers to Discharge: Continued Medical Work up   Patient Goals and CMS Choice Patient states their goals for this hospitalization and ongoing recovery are:: to go home CMS Medicare.gov Compare Post Acute Care list provided to:: Patient Choice offered to / list presented to : Patient  Expected Discharge Plan and Services Expected Discharge Plan: Home/Self Care       Living arrangements for the past 2 months: Single Family Home                                      Prior Living Arrangements/Services Living arrangements for the past 2 months: Single Family Home Lives with:: Self Patient language and need for interpreter reviewed:: Yes Do you feel safe going back to the place where you live?: Yes            Criminal Activity/Legal Involvement Pertinent to Current Situation/Hospitalization: No - Comment as needed  Activities of Daily Living Home Assistive Devices/Equipment: Eyeglasses ADL Screening (condition at time of admission) Patient's cognitive ability adequate to safely complete daily activities?: Yes Is the patient deaf or have  difficulty hearing?: No Does the patient have difficulty seeing, even when wearing glasses/contacts?: No Does the patient have difficulty concentrating, remembering, or making decisions?: No Patient able to express need for assistance with ADLs?: Yes Does the patient have difficulty dressing or bathing?: Yes Independently performs ADLs?: Yes (appropriate for developmental age) Does the patient have difficulty walking or climbing stairs?: Yes Weakness of Legs: Both Weakness of Arms/Hands: None  Permission Sought/Granted                  Emotional Assessment              Admission diagnosis:  Proctocolitis [K52.9] Hypomagnesemia [E83.42] Prolonged Q-T interval on ECG [R94.31] Bilateral cellulitis of lower leg [L03.116, L03.115] Sepsis (HCC) [A41.9] Ascites due to alcoholic cirrhosis (HCC) [K70.31] Patient Active Problem List   Diagnosis Date Noted   Malnutrition of moderate degree 06/11/2022   Portal hypertension (HCC) 06/02/2022   Bacteremia 06/02/2022   AMS (altered mental status) 06/01/2022   Thrombocytopenia (HCC) 06/01/2022   Septic bursitis 05/22/2022   Septic arthritis (HCC) 05/21/2022   Alcohol abuse 05/21/2022   On anticoagulant therapy 05/21/2022   Hypocalcemia 05/21/2022   Tongue lesion 05/21/2022   Cellulitis of right leg    Cirrhosis of liver without ascites (HCC)    Polysubstance abuse (HCC)    Cellulitis 05/05/2022   Sepsis (HCC) 05/05/2022  Alcoholic cirrhosis of liver with ascites (HCC) 04/21/2022   Hypokalemia 04/21/2022   Anemia 04/21/2022   PCP:  Pcp, No Pharmacy:   [facility] [address] [place] [address] Phone: [phone] Fax: [phone]     Social Determinants of Health (SDOH) Interventions    Readmission Risk Interventions    05/06/2022    2:26 PM  Readmission Risk Prevention Plan  Transportation Screening Complete  Medication Review (RN Care Manager) Complete

## 2022-06-30 NOTE — Procedures (Signed)
PROCEDURE SUMMARY:  Successful ultrasound guided therapeutic paracentesis from the LLQ. Yielded 7.5 L of clear, yellow fluid.  No immediate complications.  The patient tolerated the procedure well.   Specimen not sent for labs.  EBL < 1 mL  The patient has previously been formally evaluated by the [facility] Interventional Radiology Portal Hypertension Clinic and is being actively followed for potential future intervention.      Alex Gardener, AGNP-BC 06/30/2022, 4:40 PM

## 2022-07-01 ENCOUNTER — Encounter: Payer: Self-pay | Admitting: Internal Medicine

## 2022-07-01 DIAGNOSIS — K7031 Alcoholic cirrhosis of liver with ascites: Secondary | ICD-10-CM

## 2022-07-01 DIAGNOSIS — Z515 Encounter for palliative care: Secondary | ICD-10-CM

## 2022-07-01 DIAGNOSIS — Z7189 Other specified counseling: Secondary | ICD-10-CM

## 2022-07-01 LAB — CBC
HCT: 29.5 % — ABNORMAL LOW (ref 39.0–52.0)
Hemoglobin: 9.9 g/dL — ABNORMAL LOW (ref 13.0–17.0)
MCH: 32.1 pg (ref 26.0–34.0)
MCHC: 33.6 g/dL (ref 30.0–36.0)
MCV: 95.8 fL (ref 80.0–100.0)
Platelets: 45 10*3/uL — ABNORMAL LOW (ref 150–400)
RBC: 3.08 MIL/uL — ABNORMAL LOW (ref 4.22–5.81)
RDW: 17.5 % — ABNORMAL HIGH (ref 11.5–15.5)
WBC: 4.6 10*3/uL (ref 4.0–10.5)
nRBC: 0 % (ref 0.0–0.2)

## 2022-07-01 LAB — BASIC METABOLIC PANEL
Anion gap: 3 — ABNORMAL LOW (ref 5–15)
BUN: 11 mg/dL (ref 6–20)
CO2: 23 mmol/L (ref 22–32)
Calcium: 7.5 mg/dL — ABNORMAL LOW (ref 8.9–10.3)
Chloride: 104 mmol/L (ref 98–111)
Creatinine, Ser: 0.51 mg/dL — ABNORMAL LOW (ref 0.61–1.24)
GFR, Estimated: >60 mL/min (ref >60)
Glucose, Bld: 137 mg/dL — ABNORMAL HIGH (ref 70–99)
Potassium: 3.7 mmol/L (ref 3.5–5.1)
Sodium: 130 mmol/L — ABNORMAL LOW (ref 135–145)

## 2022-07-01 LAB — MAGNESIUM: Magnesium: 1.7 mg/dL (ref 1.7–2.4)

## 2022-07-01 LAB — PHOSPHORUS: Phosphorus: 2.2 mg/dL — ABNORMAL LOW (ref 2.5–4.6)

## 2022-07-01 LAB — BODY FLUID CULTURE W GRAM STAIN: Culture: NO GROWTH

## 2022-07-01 LAB — VANCOMYCIN, TROUGH: Vancomycin Tr: 12 ug/mL — ABNORMAL LOW (ref 15–20)

## 2022-07-01 LAB — GLUCOSE, CAPILLARY: Glucose-Capillary: 130 mg/dL — ABNORMAL HIGH (ref 70–99)

## 2022-07-01 MED ORDER — K PHOS MONO-SOD PHOS DI & MONO 155-852-130 MG PO TABS
500.0000 mg | ORAL_TABLET | ORAL | Status: AC
  Administered 2022-07-01 (×2): 500 mg via ORAL
  Filled 2022-07-01 (×2): qty 2

## 2022-07-01 MED ORDER — FUROSEMIDE 10 MG/ML IJ SOLN
40.0000 mg | Freq: Two times a day (BID) | INTRAMUSCULAR | Status: DC
  Administered 2022-07-01 – 2022-07-02 (×2): 40 mg via INTRAVENOUS
  Filled 2022-07-01 (×2): qty 4

## 2022-07-01 MED ORDER — SODIUM CHLORIDE 0.9 % IV SOLN
2.0000 g | INTRAVENOUS | Status: DC
  Administered 2022-07-01 – 2022-07-02 (×2): 2 g via INTRAVENOUS
  Filled 2022-07-01 (×4): qty 20

## 2022-07-01 NOTE — Progress Notes (Signed)
[facility]  - [place] [facility]   PATIENT NAME: Marco Spears    MR#:  [id number]  DATE OF BIRTH:  [birth year]  SUBJECTIVE:  patient requesting Foley catheter be removed. Tells me he states he has total swelling and very uncomfortable having a Foley catheter. He wants to change his diet to regular food. And requesting he take shower. No fever. His abdomen feels better after paracentesis.   VITALS:  Blood pressure 132/83, pulse (!) 107, temperature 100 F (37.8 C), temperature source Oral, resp. rate 18, height 6\' 3"  (1.905 m), weight 123.8 kg, SpO2 97 %.  PHYSICAL EXAMINATION:   GENERAL:  46 y.o.-year-old patient lying in the bed with no acute distress. Obese generalized Anasarca LUNGS: decreased breath sounds bilaterally, no wheezing, rales, rhonchi.  CARDIOVASCULAR: S1, S2 normal. No murmurs, rubs, or gallops.  ABDOMEN: Soft, nontender, nondistended. Bowel sounds present.  EXTREMITIES: severe edema   NEUROLOGIC: nonfocal  patient is alert and awake SKIN:per RN  LABORATORY PANEL:  CBC Recent Labs  Lab 07/01/22 0413  WBC 4.6  HGB 9.9*  HCT 29.5*  PLT 45*    Chemistries  Recent Labs  Lab 06/29/22 0502 06/30/22 0454 07/01/22 0413  NA 131*   < > 130*  K 3.4*   < > 3.7  CL 103   < > 104  CO2 25   < > 23  GLUCOSE 124*   < > 137*  BUN 15   < > 11  CREATININE 0.69   < > 0.51*  CALCIUM 7.5*   < > 7.5*  MG 1.6*   < > 1.7  AST 36  --   --   ALT 28  --   --   ALKPHOS 71  --   --   BILITOT 5.4*  --   --    < > = values in this interval not displayed.   Cardiac Enzymes No results for input(s): "TROPONINI" in the last 168 hours. RADIOLOGY:  08/31/22 Paracentesis  Result Date: 07/01/2022 INDICATION: Patient with history of cirrhosis with recurrent ascites. [facility] interventional radiology portal hypertension evaluation completed 06/15/2022. Patient is being followed for potential future intervention. EXAM: ULTRASOUND GUIDED THERAPEUTIC LEFT LOWER  QUADRANT PARACENTESIS MEDICATIONS: 10 mL 1 % lidocaine COMPLICATIONS: None immediate. PROCEDURE: Informed written consent was obtained from the patient after a discussion of the risks, benefits and alternatives to treatment. A timeout was performed prior to the initiation of the procedure. Initial ultrasound scanning demonstrates a moderate amount of ascites within the left lower abdominal quadrant. The left lower abdomen was prepped and draped in the usual sterile fashion. 1% lidocaine was used for local anesthesia. Following this, a 19 gauge, 7-cm, Yueh catheter was introduced. An ultrasound image was saved for documentation purposes. The paracentesis was performed. The catheter was removed and a dressing was applied. The patient tolerated the procedure well without immediate post procedural complication. Patient received post-procedure intravenous albumin; see nursing notes for details. FINDINGS: A total of approximately 7.5 L of clear, yellow fluid was removed. IMPRESSION: Successful ultrasound-guided paracentesis yielding 7.5 liters of peritoneal fluid. PLAN: The patient has previously been formally evaluated by the [facility] Interventional Radiology Portal Hypertension Clinic and is being actively followed for potential future intervention. Read by: [facility], AGNP-BC Electronically Signed   By: Alex Gardener M.D.   On: 07/01/2022 07:08    Assessment and Plan Marco Spears is a 46 y.o. male with medical history significant of  polysubstance abuse, etoh liver  disease with cirrhosis, hep C s/p tx , DVT s/p tx with Eliquis, history of b/l lower extremity cellulitis as well as right knee infection, who has recent hospitalization 8/7-8/21 for septic arthritis of right Knee, ESBL E coli bacteremia and SBP for which patient was treated with meropenem.  Patient discharge complicated by patient history of noncompliance and it was noted that he was a high risk for progression of infection due to his  noncompliance. Patient now returns to ED BIBEMS with complaint of increase fluid. He notes that his abdomen has gotten more swollen in the last 24 hour. He also has complaint of urinary retention.   Severe Sepsis  --fever, tachycardia, elevated lactic acid, source bacteremia and SBP --received vanc/cefe/flagyl and IVF in the ED. -- sepsis improved   Bacteremia E coli --4/4 bottles pos, not ESBL this time --cont cefepime-- change to IV Rocephin for pharmacy recommendation   SBP --fever, large ascites, abdominal pain --US paracentesis 9/3 with 7L removed.  Fluid studies consistent with SBP --cont Rocephin   Decompensated Cirrhosis with  Recurrent ascites Medication non-compliant  --US paracentesis 9/3 with 7L removed.  Fluid studies consistent with SBP --US paracentesis again today with 7.5L removed, with IV albumin order. --resume lasix and aldactone --palliative consult -- appreciated. Patient is DNR   Possible cellulitis around right knee Hx of prepatellar bursitis I&D x 2 (7/13, 7/27) with Dr. Martha Clan --Pt refused wound VAC application and was non-compliant with abx after discharge from last hospitalization --ortho consulted, rec wound care --wound care per order -- patient seen by Dr. Okey Dupre. Patient has had several rounds of antibiotic for last few months. He has been noncompliant with outpatient follow-up and outpatient antibiotic.   Proctocolitis --as per imaging. 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 Patient is at a high risk for readmission due to reasons above TOC for discharge planning.   Level of care: Med-Surg Status is: Inpatient Remains inpatient appropriate because: patient will need a couple more days of IV antibiotic. Will repeat paracentesis in a day or two to ensure counts trending down    TOTAL TIME TAKING CARE OF THIS PATIENT: 35 minutes.  >50% time spent on counselling and coordination of care  Note: This dictation was prepared with Dragon dictation along with smaller phrase technology. Any transcriptional errors that result from this process are unintentional.  Enedina Finner M.D    [facility]   CC: Primary care physician; Pcp, No

## 2022-07-01 NOTE — Plan of Care (Signed)
  Problem: Health Behavior/Discharge Planning: Goal: Ability to manage health-related needs will improve Outcome: Progressing   

## 2022-07-01 NOTE — Consult Note (Signed)
Consultation Note Date: 07/01/2022   Patient Name: Marco Spears  DOB: 04-Feb-1976  MRN: [id number]  Age / Sex: 46 y.o., male  PCP: Pcp, No Referring Physician: Enedina Finner, MD  Reason for Consultation: Establishing goals of care  HPI/Patient Profile: 46 y.o. male  with past medical history of polysubstance abuse, alcoholic liver disease with cirrhosis, hep C status posttreatment, DVT status posttreatment with Eliquis, history of bilateral lower extremity cellulitis as well as right knee infection, hospitalized 8/7 through 21 with septic arthritis of right knee and ESBL E. coli bacteremia, 5 hospital stays in the last 6 months in the [facility] system alone (he endorses that he has been to other hospitals) admitted on 06/28/2022 with severe sepsis, bacteremia E. coli, SBP.   Clinical Assessment and Goals of Care: I have reviewed medical records including EPIC notes, labs and imaging, received report from RN, assessed the patient.  Marco Spears is lying quietly in bed.  He appears acutely/chronically ill and quite frail.  He is alert and oriented x3, able to make his basic needs known.  There is no family present at bedside at this time.    We meet at bedside to discuss diagnosis prognosis, GOC, EOL wishes, disposition and options.  I introduced Palliative Medicine as specialized medical care for people living with serious illness. It focuses on providing relief from the symptoms and stress of a serious illness. The goal is to improve quality of life for both the patient and the family.  We discussed a brief life review of the patient.  Marco Spears tells me that he is unmarried, has no children.  He has been homeless, but has been spending time with his brother.  His parents are deceased, but his grandmother Harriett Sine pace lives in [place].   We focused on their current illness.  Marco Spears tells me that his main concern  right now is his swollen scrotum and the indwelling Foley catheter.  We talk about what is normal and expected with his chronic illness burden.  Marco Spears is able to accurately tell me about his paracentesis yesterday, and he shares that he has taps usually twice a week while hospitalized and weekly when not hospitalized.  The natural disease trajectory and expectations at EOL were discussed.  Advanced directives, concepts specific to code status, artifical feeding and hydration, and rehospitalization were considered and discussed.  We talked about the concept of "treat the treatable, but allowing natural passing"/DNR.  Marco Spears tells me that he is not suicidal, but understands that each person has their time.  He tells me that he would not want to be on life support.  He shares that he is talked with his family many times and they understand that he is ready when his time comes.  I ask if his grandmother and brother could about his wishes for DNR, he shakes his head no.  He says that they understand that that is his wish, but he is not sure that they could agree.  Orders adjusted.  Hospice and Palliative Care services outpatient were explained and offered.  We talk about the benefits of "treat the treatable" hospice care.  Marco Spears would like time to think about services.  This is complicated by the fact that he does not have insurance.  Discussed the importance of continued conversation with family and the medical providers regarding overall plan of care and treatment options, ensuring decisions are within the context of the patient's values and GOCs.  Questions and concerns were addressed. The patient was encouraged to call with questions or concerns.  PMT will continue to support holistically.  Conference with attending, bedside nursing staff, transition of care team related to patient condition, needs, goals of care, disposition.   HCPOA NEXT OF KIN -Marco Spears tells me that he would name his  grandmother, Harriett Sine pace, who lives in [place] [phone] as his healthcare surrogate.  He tells me that is unmarried, has no children.  He tells me that his brother is currently in jail in [place].    SUMMARY OF RECOMMENDATIONS   At this point continue to treat the treatable but no CPR or intubation Time for outcomes Would benefit from outpatient palliative/hospice care, considering   Code Status/Advance Care Planning: DNR -we talked about the concept of "treat the treatable, but allowing natural passing"/DNR.  Mr. Sotomayor tells me that he is not suicidal, but understands that each person has their time.  He tells me that he would not want to be on life support.  He shares that he is talked with his family many times and they understand that he is ready when his time comes.  I ask if his grandmother and brother could about his wishes for DNR, he shakes his head no.  He says that they understand that that is his wish, but he is not sure that they could agree.  Orders adjusted.  I share that he will have a purple bracelet placed on his arm.  He states understanding.  Symptom Management:  Per hospitalist, no additional needs at this time.  Palliative Prophylaxis:  Frequent Pain Assessment, Oral Care, and Turn Reposition  Additional Recommendations (Limitations, Scope, Preferences): Treat the treatable but no CPR or intubation.  Psycho-social/Spiritual:  Desire for further Chaplaincy support:no Additional Recommendations: Caregiving  Support/Resources  Prognosis:  < 3 months, would not be surprising based on chronic illness burden, 5 hospital stays in the last 6 months  Discharge Planning: To be determined, anticipate home      Primary Diagnoses: Present on Admission:  Sepsis (HCC)   I have reviewed the medical record, interviewed the patient and family, and examined the patient. The following aspects are pertinent.  Past Medical History:  Diagnosis Date    Cirrhosis of liver (HCC)    Heart murmur    Polysubstance abuse (HCC)    Septic bursitis    Social History   Socioeconomic History   Marital status: Single    Spouse name: Not on file   Number of children: Not on file   Years of education: Not on file   Highest education level: Not on file  Occupational History   Not on file  Tobacco Use   Smoking status: Some Days    Types: Cigarettes   Smokeless tobacco: Never  Substance and Sexual Activity   Alcohol use: Never   Drug use: Yes    Types: Marijuana, Methamphetamines, IV   Sexual activity: Not on file  Other Topics Concern   Not on file  Social  History Narrative   Not on file   Social Determinants of Health   Financial Resource Strain: Not on file  Food Insecurity: Not on file  Transportation Needs: Not on file  Physical Activity: Not on file  Stress: Not on file  Social Connections: Not on file   History reviewed. No pertinent family history. Scheduled Meds:  Chlorhexidine Gluconate Cloth  6 each Topical Daily   feeding supplement  237 mL Oral TID BM   furosemide  40 mg Oral BID   multivitamin with minerals  1 tablet Oral Daily   spironolactone  100 mg Oral Daily   Continuous Infusions:  ceFEPime (MAXIPIME) IV 2 g (07/01/22 1019)   vancomycin 1,500 mg (07/01/22 0711)   PRN Meds:.lactulose, methocarbamol, morphine Medications Prior to Admission:  Prior to Admission medications   Medication Sig Start Date End Date Taking? Authorizing Provider  B Complex-C (B-COMPLEX WITH VITAMIN C) tablet Take 1 tablet by mouth daily.   Yes [provider]  ferrous sulfate 325 (65 FE) MG tablet Take 1 tablet (325 mg total) by mouth daily with breakfast. 05/22/22  Yes Sreenath, Sudheer B, MD  folic acid (FOLVITE) 1 MG tablet Take 1 tablet (1 mg total) by mouth daily. 05/22/22  Yes Sreenath, Sudheer B, MD  furosemide (LASIX) 40 MG tablet Take 1 tablet (40 mg total) by mouth 2 (two) times daily. 05/22/22  Yes Sreenath,  Sudheer B, MD  Multiple Vitamin (MULTIVITAMIN WITH MINERALS) TABS tablet Take 1 tablet by mouth daily.   Yes [provider]  spironolactone (ALDACTONE) 50 MG tablet Take 2 tablets (100 mg total) by mouth daily. 05/22/22  Yes Sreenath, Sudheer B, MD  thiamine (VITAMIN B1) 100 MG tablet Take 1 tablet (100 mg total) by mouth daily. 06/16/22 07/16/22 Yes Lynn Ito, MD  gabapentin (NEURONTIN) 400 MG capsule Take 1 capsule (400 mg total) by mouth 3 (three) times daily for 21 days. 05/26/22 06/16/22  Tresa Moore, MD   Allergies  Allergen Reactions   Amoxicillin Rash    TOLERATED CEFAZOLIN PRIOR States it makes his skin turn red   Review of Systems  Unable to perform ROS: Other    Physical Exam Vitals and nursing note reviewed.  Constitutional:      General: He is not in acute distress.    Appearance: He is ill-appearing.  HENT:     Mouth/Throat:     Mouth: Mucous membranes are moist.  Cardiovascular:     Rate and Rhythm: Normal rate.  Pulmonary:     Effort: Pulmonary effort is normal. No respiratory distress.  Abdominal:     General: Abdomen is protuberant. There is distension.     Palpations: Abdomen is soft.  Genitourinary:    Testes:        Right: Swelling present.  Skin:    General: Skin is warm and dry.     Coloration: Skin is jaundiced.  Neurological:     Mental Status: He is alert and oriented to person, place, and time.  Psychiatric:        Mood and Affect: Mood normal.        Behavior: Behavior normal.     Vital Signs: BP 132/83 (BP Location: Right Arm)   Pulse (!) 107   Temp 100 F (37.8 C) (Oral)   Resp 18   Ht 6\' 3"  (1.905 m)   Wt 123.8 kg   SpO2 97%   BMI 34.11 kg/m  Pain Scale: 0-10 POSS *See Group Information*: 1-Acceptable,Awake  and alert Pain Score: 0-No pain   SpO2: SpO2: 97 % O2 Device:SpO2: 97 % O2 Flow Rate: .O2 Flow Rate (L/min): 0 L/min  IO: Intake/output summary:  Intake/Output Summary (Last 24 hours) at 07/01/2022  1333 Last data filed at 07/01/2022 1328 Gross per 24 hour  Intake 1150.81 ml  Output 5050 ml  Net -3899.19 ml    LBM: Last BM Date : 06/30/22 Baseline Weight: Weight: 108.9 kg Most recent weight: Weight: 123.8 kg     Palliative Assessment/Data:   Flowsheet Rows    Flowsheet Row Most Recent Value  Intake Tab   Referral Department Hospitalist  Unit at Time of Referral Med/Surg Unit  Palliative Care Primary Diagnosis Sepsis/Infectious Disease  Date Notified 06/30/22  Palliative Care Type New Palliative care  Reason for referral Clarify Goals of Care  Date of Admission 06/28/22  Date first seen by Palliative Care 07/01/22  # of days Palliative referral response time 1 Day(s)  # of days IP prior to Palliative referral 2  Clinical Assessment   Palliative Performance Scale Score 40%  Pain Max last 24 hours Not able to report  Pain Min Last 24 hours Not able to report  Dyspnea Max Last 24 Hours Not able to report  Dyspnea Min Last 24 hours Not able to report  Psychosocial & Spiritual Assessment   Palliative Care Outcomes        Time In: 1045 Time Out: 1200 Time Total: 75 minutes  Greater than 50%  of this time was spent counseling and coordinating care related to the above assessment and plan.  Signed by: Katheran Awe, NP   Please contact Palliative Medicine Team phone at [phone] for questions and concerns.  For individual provider: See Loretha Stapler

## 2022-07-02 ENCOUNTER — Inpatient Hospital Stay: Payer: Medicaid Other

## 2022-07-02 DIAGNOSIS — F1910 Other psychoactive substance abuse, uncomplicated: Secondary | ICD-10-CM

## 2022-07-02 DIAGNOSIS — K652 Spontaneous bacterial peritonitis: Secondary | ICD-10-CM

## 2022-07-02 LAB — BODY FLUID CELL COUNT WITH DIFFERENTIAL
Eos, Fluid: 0 %
Lymphs, Fluid: 8 %
Monocyte-Macrophage-Serous Fluid: 23 %
Neutrophil Count, Fluid: 69 %
Total Nucleated Cell Count, Fluid: 1208 cu mm

## 2022-07-02 LAB — PATHOLOGIST SMEAR REVIEW

## 2022-07-02 MED ORDER — POTASSIUM CHLORIDE CRYS ER 20 MEQ PO TBCR
40.0000 meq | EXTENDED_RELEASE_TABLET | Freq: Once | ORAL | Status: AC
  Administered 2022-07-02: 40 meq via ORAL
  Filled 2022-07-02: qty 2

## 2022-07-02 MED ORDER — ENSURE MAX PROTEIN PO LIQD: 11.0000 [oz_av] | Freq: Every day | ORAL | Status: DC

## 2022-07-02 MED ORDER — METOLAZONE 5 MG PO TABS
5.0000 mg | ORAL_TABLET | Freq: Once | ORAL | Status: AC
Start: 2022-07-02 — End: 2022-07-02
  Administered 2022-07-02: 5 mg via ORAL
  Filled 2022-07-02: qty 1

## 2022-07-02 MED ORDER — FUROSEMIDE 10 MG/ML IJ SOLN
40.0000 mg | Freq: Two times a day (BID) | INTRAMUSCULAR | Status: DC
  Administered 2022-07-02 – 2022-07-03 (×2): 40 mg via INTRAVENOUS
  Filled 2022-07-02 (×2): qty 4

## 2022-07-02 MED ORDER — ENSURE MAX PROTEIN PO LIQD
11.0000 [oz_av] | Freq: Every day | ORAL | Status: DC
  Administered 2022-07-02 – 2022-07-03 (×2): 11 [oz_av] via ORAL

## 2022-07-02 MED ORDER — MORPHINE SULFATE 15 MG PO TABS
15.0000 mg | ORAL_TABLET | Freq: Two times a day (BID) | ORAL | Status: DC | PRN
  Administered 2022-07-02 (×2): 15 mg via ORAL
  Filled 2022-07-02: qty 1

## 2022-07-02 NOTE — Progress Notes (Signed)
[facility]  - [facility]   PATIENT NAME: Marco Spears    MR#:  [id number]  DATE OF BIRTH:  [birth year]  SUBJECTIVE:  C/o scrotal edema Able to ambulate to the BR and in the room No fever Eating well Good uop with IV lasix VITALS:  Blood pressure 115/72, pulse 85, temperature 98.4 F (36.9 C), temperature source Oral, resp. rate 18, height 6\' 3"  (1.905 m), weight 121.6 kg, SpO2 98 %.  PHYSICAL EXAMINATION:   GENERAL:  46 y.o.-year-old patient lying in the bed with no acute distress. Obese generalized Anasarca LUNGS: decreased breath sounds bilaterally, no wheezing, rales, rhonchi.  CARDIOVASCULAR: S1, S2 normal. No murmurs, rubs, or gallops.  ABDOMEN: Soft, nontender, nondistended. Bowel sounds present.  EXTREMITIES: severe edema   NEUROLOGIC: nonfocal  patient is alert and awake SKIN:per RN  LABORATORY PANEL:  CBC Recent Labs  Lab 07/01/22 0413  WBC 4.6  HGB 9.9*  HCT 29.5*  PLT 45*     Chemistries  Recent Labs  Lab 06/29/22 0502 06/30/22 0454 07/01/22 0413  NA 131*   < > 130*  K 3.4*   < > 3.7  CL 103   < > 104  CO2 25   < > 23  GLUCOSE 124*   < > 137*  BUN 15   < > 11  CREATININE 0.69   < > 0.51*  CALCIUM 7.5*   < > 7.5*  MG 1.6*   < > 1.7  AST 36  --   --   ALT 28  --   --   ALKPHOS 71  --   --   BILITOT 5.4*  --   --    < > = values in this interval not displayed.    Cardiac Enzymes No results for input(s): "TROPONINI" in the last 168 hours. RADIOLOGY:  08/31/22 Paracentesis  Result Date: 07/01/2022 INDICATION: Patient with history of cirrhosis with recurrent ascites. [facility] interventional radiology portal hypertension evaluation completed 06/15/2022. Patient is being followed for potential future intervention. EXAM: ULTRASOUND GUIDED THERAPEUTIC LEFT LOWER QUADRANT PARACENTESIS MEDICATIONS: 10 mL 1 % lidocaine COMPLICATIONS: None immediate. PROCEDURE: Informed written consent was obtained from the patient after a  discussion of the risks, benefits and alternatives to treatment. A timeout was performed prior to the initiation of the procedure. Initial ultrasound scanning demonstrates a moderate amount of ascites within the left lower abdominal quadrant. The left lower abdomen was prepped and draped in the usual sterile fashion. 1% lidocaine was used for local anesthesia. Following this, a 19 gauge, 7-cm, Yueh catheter was introduced. An ultrasound image was saved for documentation purposes. The paracentesis was performed. The catheter was removed and a dressing was applied. The patient tolerated the procedure well without immediate post procedural complication. Patient received post-procedure intravenous albumin; see nursing notes for details. FINDINGS: A total of approximately 7.5 L of clear, yellow fluid was removed. IMPRESSION: Successful ultrasound-guided paracentesis yielding 7.5 liters of peritoneal fluid. PLAN: The patient has previously been formally evaluated by the  [facility] Interventional Radiology Portal Hypertension Clinic and is being actively followed for potential future intervention. Read by: [facility], AGNP-BC Electronically Signed   By: Alex Gardener M.D.   On: 07/01/2022 07:08    Assessment and Plan Marco Spears is a 46 y.o. male with medical history significant of  polysubstance abuse, etoh liver disease with cirrhosis, hep C s/p tx , DVT s/p tx with Eliquis, history of b/l lower extremity cellulitis as well as  right knee infection, who has recent hospitalization 8/7-8/21 for septic arthritis of right Knee, ESBL E coli bacteremia and SBP for which patient was treated with meropenem.  Patient discharge complicated by patient history of noncompliance and it was noted that he was a high risk for progression of infection due to his noncompliance. Patient now returns to ED BIBEMS with complaint of increase fluid. He notes that his abdomen has gotten more swollen in the last 24 hour. He also has  complaint of urinary retention.   Severe Sepsis  --fever, tachycardia, elevated lactic acid, source bacteremia and SBP --received vanc/cefe/flagyl and IVF in the ED. -- sepsis improved   Bacteremia E coli --4/4 bottles pos, not ESBL this time --cont cefepime-- change to IV Rocephin for pharmacy recommendation    Decompensated Cirrhosis with Recurrent ascites SBP, recurrent Medication non-compliant  --US paracentesis 9/3 with 7L removed.  Fluid studies consistent with SBP --US paracentesis again today with 7.5L removed, with IV albumin order. --resume lasix and aldactone --palliative consult -- appreciated. Patient is DNR --will order paracentesis for tomorrow   Possible cellulitis around right knee Hx of prepatellar bursitis I&D x 2 (7/13, 7/27) with Dr. Martha Clan --Pt refused wound VAC application and was non-compliant with abx after discharge from last hospitalization --ortho consulted, rec wound care --wound care per order -- patient seen by Dr. Okey Dupre. Patient has had several rounds of antibiotic for last few months. He has been noncompliant with outpatient follow-up and outpatient antibiotic.   Proctocolitis --as per imaging. --already on broad-spectrum abx for other infections   Acute Metabolic Encephalopathy -multifactorial / sepsis/ hepatic encephalopathy -- mentation at baseline  Anemia of chronic disease --recent anemia workup neg for def in iron, folate and Vit B12 -continue to monitor h/h    Polysubstance abuse-- chronic + amphetamine/marijuana    Hypomag --replete with IV mag   Chronic thrombocytopenia  --due to cirrhosis     DVT prophylaxis: SCD/Compression stockings Code Status: DNR in discussion with palliative care [name]  Overall poor prognosis. Patient is at a high risk for readmission due to reasons above TOC for discharge planning.   Level of care: Med-Surg Status is: Inpatient Remains inpatient appropriate because: patient will need a  couple more days of IV antibiotic. Will repeat paracentesis in a day or two to ensure counts trending down   discussed with patient likely discharge over the weekend.  TOTAL TIME TAKING CARE OF THIS PATIENT: 35 minutes.  >50% time spent on counselling and coordination of care  Note: This dictation was prepared with Dragon dictation along with smaller phrase technology. Any transcriptional errors that result from this process are unintentional.  Enedina Finner M.D    [facility]   CC: Primary care physician; Pcp, No

## 2022-07-02 NOTE — Progress Notes (Signed)
Offered wound care. Pt said it did not need it.

## 2022-07-02 NOTE — Progress Notes (Signed)
Palliative: [name], Marco Spears, is lying quietly in bed.  He appears acutely/chronically ill and quite frail.  He is alert and oriented x3, able to make his basic needs known.  There is no family present at bedside at this time.  We talk about his acute and chronic health concerns including, but not limited to, liver disease and paracentesis, fluid overload, low albumin, nutrition, mobility.  Marco Spears tells me that he was able to get a shower yesterday.  He shares that he is continuing to have scrotal swelling.  We talk in detail about the likely causes such as liver failure with ascites and low albumin.  We talk about the treatment plan.  We talk about what we can and cannot change.   We talk about the benefits of hospice care.  I ask if it would surprise him if he were to qualify for hospice care.  He tells me that it would.  I ask if he would be surprised if he did not survive until Christmas.  He tells me that this would not surprise him.  At this point, Marco Spears tells me that he is not ready for comfort care, residential hospice.  His care is complicated by being on home and and substance abuse.  Transition of care team is working diligently to assist Marco Spears.  Unfortunately, there are multiple limitations on assistance that can be provided.  Conference with attending, bedside nursing staff, transition of care team related to patient condition, needs, goals of care, disposition. DNR/goldenrod form completed and placed on chart.  Plan: At this point continue to treat the treatable but no CPR or intubation.  Time for outcomes.  Anticipate discharge to the street.  35 minutes Lillia Carmel, NP Palliative medicine team Team phone [phone] Greater than 50% of this time was spent counseling and coordinating care related to the above assessment and plan.

## 2022-07-02 NOTE — Plan of Care (Signed)
  Problem: Health Behavior/Discharge Planning: Goal: Ability to manage health-related needs will improve Outcome: Progressing   

## 2022-07-02 NOTE — Procedures (Signed)
PROCEDURE SUMMARY:  Successful ultrasound guided paracentesis from the LUQ. Yielded 1.9 L of clear yellow fluid.  No immediate complications.  The patient tolerated the procedure well.   Specimen was sent for labs.  EBL <  50mL  The patient has previously been formally evaluated by the [facility] Interventional Radiology Portal Hypertension Clinic and is being actively followed for potential future intervention.  Berneta Levins, PA-C 07/02/2022, 2:34 PM

## 2022-07-03 ENCOUNTER — Other Ambulatory Visit: Payer: Self-pay

## 2022-07-03 LAB — CULTURE, BLOOD (ROUTINE X 2): Specimen Description: ADEQUATE

## 2022-07-03 LAB — GRAM STAIN

## 2022-07-03 MED ORDER — CEFADROXIL 500 MG PO CAPS
1000.0000 mg | ORAL_CAPSULE | Freq: Once | ORAL | Status: AC
Start: 2022-07-03 — End: 2022-07-03
  Administered 2022-07-03: 1000 mg via ORAL
  Filled 2022-07-03: qty 2

## 2022-07-03 MED ORDER — CEFADROXIL 500 MG PO CAPS
1000.0000 mg | ORAL_CAPSULE | Freq: Two times a day (BID) | ORAL | 0 refills | Status: DC
  Filled 2022-07-03: qty 70, 18d supply, fill #0

## 2022-07-03 MED ORDER — FUROSEMIDE 40 MG PO TABS
40.0000 mg | ORAL_TABLET | Freq: Two times a day (BID) | ORAL | 3 refills | Status: AC
Start: 2022-07-03 — End: ?
  Filled 2022-07-03: qty 60, 30d supply, fill #0

## 2022-07-03 MED ORDER — SPIRONOLACTONE 50 MG PO TABS
100.0000 mg | ORAL_TABLET | Freq: Every day | ORAL | 3 refills | Status: AC
Start: 2022-07-03 — End: ?
  Filled 2022-07-03: qty 30, 15d supply, fill #0

## 2022-07-03 NOTE — Discharge Instructions (Signed)
[facility]. He has been provided paperwork. Abstain from using street drugs take your medications as instructed

## 2022-07-03 NOTE — Discharge Summary (Signed)
Physician Discharge Summary   Patient: Marco Spears MRN: [id number] DOB: 23-Mar-1976  Admit date:     06/28/2022  Discharge date: 07/03/22  Discharge Physician: Enedina FinnerSona Leocadio Heal   PCP: Pcp, No   Recommendations at discharge:   patient [facility]. He has been provided paperwork abstain from using drugs  Discharge Diagnoses: E. coli sepsis recurrent SBP decompensated chronic cirrhosis of liver,alcohol related polysubstance abuse  Hospital Course: Marco HumblesJoshua S Maple is a 46 y.o. male with medical history significant of  polysubstance abuse, etoh liver disease with cirrhosis, hep C s/p tx , DVT s/p tx with Eliquis, history of b/l lower extremity cellulitis as well as right knee infection, who has recent hospitalization 8/7-8/21 for septic arthritis of right Knee, ESBL E coli bacteremia and SBP for which patient was treated with meropenem.  Patient discharge complicated by patient history of noncompliance and it was noted that he was a high risk for progression of infection due to his noncompliance. Patient now returns to ED BIBEMS with complaint of increase fluid. He notes that his abdomen has gotten more swollen in the last 24 hour. He also has complaint of urinary retention.   Severe Sepsis  --fever, tachycardia, elevated lactic acid, source bacteremia and SBP --received vanc/cefe/flagyl and IVF in the ED. -- sepsis improved   Bacteremia E coli --4/4 bottles pos, not ESBL this time --cont cefepime-- change to IV Rocephin for pharmacy recommendation    Decompensated Cirrhosis with Recurrent ascites SBP, recurrent Medication non-compliant  --US paracentesis 9/3 with 7L removed.  Fluid studies consistent with SBP --US paracentesis again today with 7.5L removed, with IV albumin order. --resume lasix and aldactone --palliative consult -- appreciated. Patient is DNR --US paracentesis 9/7--1.9 L removed cellcount trending down --change to po cefradroxil 1g  bid and then change to 500 mg bid for prophylaxis   Possible cellulitis around right knee Hx of prepatellar bursitis I&D x 2 (7/13, 7/27) with Dr. Martha ClanKrasinski --Pt refused wound VAC application and was non-compliant with abx after discharge from last hospitalization --ortho consulted, rec wound care --wound care per order -- patient seen by Dr. Okey Duprerawford. Patient has had several rounds of antibiotic for last few months. He has been noncompliant with outpatient follow-up and outpatient antibiotic.   H/o Proctocolitis --as per imaging.   Acute Metabolic Encephalopathy -multifactorial / sepsis/ hepatic encephalopathy -- mentation at baseline   Anemia of chronic disease --recent anemia workup neg for def in iron, folate and Vit B12  Polysubstance abuse-- chronic + amphetamine/marijuana    Hypomag --replete with IV mag   Chronic thrombocytopenia  --due to cirrhosis   patient will discharged today. I did stress importance on taking his meds and [facility]. TOC for discharge planning. Patient will get antibiotics, Lasix and Spironolactone from employee pharmacy.  DVT prophylaxis: SCD/Compression stockings Code Status: DNR in discussion with palliative care [name]   Overall poor prognosis. Patient is at a high risk for readmission due to reasons above      Consultants: ortho Procedures performed: US paracentesis x3 Disposition:boarding house Diet recommendation:  Discharge Diet Orders (From admission, onward)     Start     Ordered   07/03/22 0000  Diet - low sodium heart healthy        07/03/22 1215           Cardiac diet DISCHARGE MEDICATION: Allergies as of 07/03/2022       Reactions   Amoxicillin Rash  TOLERATED CEFAZOLIN PRIOR States it makes his skin turn red        Medication List     STOP taking these medications    ciprofloxacin 500 MG tablet Commonly known as: CIPRO   gabapentin 400 MG capsule Commonly known as:  NEURONTIN       TAKE these medications    B-complex with vitamin C tablet Take 1 tablet by mouth daily.   cefadroxil 500 MG capsule Commonly known as: DURICEF Take 2 capsules (1,000 mg) by mouth 2 (two) times daily for 7 days then starting on 9/16 take 1 capsule ( ) twice daily Start taking on: July 04, 2022   FeroSul 325 (65 FE) MG tablet Generic drug: ferrous sulfate Take 1 tablet (325 mg total) by mouth daily with breakfast.   folic acid 1 MG tablet Commonly known as: FOLVITE Take 1 tablet (1 mg total) by mouth daily.   furosemide 40 MG tablet Commonly known as: LASIX Take 1 tablet (40 mg total) by mouth 2 (two) times daily.   multivitamin with minerals Tabs tablet Take 1 tablet by mouth daily.   spironolactone 50 MG tablet Commonly known as: ALDACTONE Take 2 tablets (100 mg total) by mouth daily.   thiamine 100 MG tablet Commonly known as: VITAMIN B1 Take 1 tablet (100 mg total) by mouth daily.               Discharge Care Instructions  (From admission, onward)           Start     Ordered   07/03/22 0000  Discharge wound care:       Comments:    Wound care  Every shift      Comments: Wound care to right knee full thickness wound:  Cleanse wound with NS, pat dry. Cover with size appropriate piece of silver hydrofiber (Aquacel Ag+ Advantage, [employer] [place] # [id number]). Top with dry gauze dressing and secure with a few turns of Kerlix roll gauze/paper tape. Change daily.  May teach patient prior to discharge.  06/28/22 1755          07/03/22 1215            Discharge Exam: Filed Weights   07/01/22 0500 07/02/22 0347 07/03/22 0500  Weight: 123.8 kg 121.6 kg 121.5 kg     Condition at discharge: fair  The results of significant diagnostics from this hospitalization (including imaging, microbiology, ancillary and laboratory) are listed below for reference.   Imaging Studies: US Paracentesis  Result Date: 07/02/2022 INDICATION: Patient  with history of cirrhosis and recurrent ascites currently being treated for SBP request received for repeat paracentesis to evaluate for improvement in cell count. [facility] interventional radiology portal hypertension evaluation completed 06/15/2022. Patient is being followed for potential future intervention. EXAM: ULTRASOUND GUIDED  PARACENTESIS MEDICATIONS: Local 1% lidocaine only. COMPLICATIONS: None immediate. PROCEDURE: Informed written consent was obtained from the patient after a discussion of the risks, benefits and alternatives to treatment. A timeout was performed prior to the initiation of the procedure. Initial ultrasound scanning demonstrates a small amount of ascites within the left upper abdominal quadrant. The left upper abdomen was prepped and draped in the usual sterile fashion. 1% lidocaine was used for local anesthesia. Following this, a 19 gauge, 7-cm, Yueh catheter was introduced. An ultrasound image was saved for documentation purposes. The paracentesis was performed. The catheter was removed and a dressing was applied. The patient tolerated the procedure well without immediate post procedural complication. FINDINGS: A total of  approximately 1.9 L of clear yellow fluid was removed. Samples were sent to the laboratory as requested by the clinical team. IMPRESSION: Successful ultrasound-guided paracentesis yielding 1.9 liters of peritoneal fluid. PLAN: The patient has previously been formally evaluated by the [facility] Interventional Radiology Portal Hypertension Clinic and is being actively followed for potential future intervention. This exam was performed by Pattricia Boss PA-C, and was supervised and interpreted by Dr. Fredia Sorrow. Electronically Signed   By: Irish Lack M.D.   On: 07/02/2022 15:11   US Paracentesis  Result Date: 07/01/2022 INDICATION: Patient with history of cirrhosis with recurrent ascites. [facility] interventional radiology portal hypertension evaluation completed  06/15/2022. Patient is being followed for potential future intervention. EXAM: ULTRASOUND GUIDED THERAPEUTIC LEFT LOWER QUADRANT PARACENTESIS MEDICATIONS: 10 mL 1 % lidocaine COMPLICATIONS: None immediate. PROCEDURE: Informed written consent was obtained from the patient after a discussion of the risks, benefits and alternatives to treatment. A timeout was performed prior to the initiation of the procedure. Initial ultrasound scanning demonstrates a moderate amount of ascites within the left lower abdominal quadrant. The left lower abdomen was prepped and draped in the usual sterile fashion. 1% lidocaine was used for local anesthesia. Following this, a 19 gauge, 7-cm, Yueh catheter was introduced. An ultrasound image was saved for documentation purposes. The paracentesis was performed. The catheter was removed and a dressing was applied. The patient tolerated the procedure well without immediate post procedural complication. Patient received post-procedure intravenous albumin; see nursing notes for details. FINDINGS: A total of approximately 7.5 L of clear, yellow fluid was removed. IMPRESSION: Successful ultrasound-guided paracentesis yielding 7.5 liters of peritoneal fluid. PLAN: The patient has previously been formally evaluated by the [facility] Interventional Radiology Portal Hypertension Clinic and is being actively followed for potential future intervention. Read by: Alex Gardener, AGNP-BC Electronically Signed   By: Olive Bass M.D.   On: 07/01/2022 07:08   US Paracentesis  Result Date: 06/29/2022 INDICATION: Patient with history of cirrhosis and recurrent ascites, [facility] Interventional Radiology Portal Hypertension Clinic evaluation done 06/15/22 EXAM: ULTRASOUND GUIDED LEFT PARACENTESIS MEDICATIONS: None. COMPLICATIONS: None immediate. PROCEDURE: Informed written consent was obtained from the patient after a discussion of the risks, benefits and alternatives to treatment. A timeout was performed  prior to the initiation of the procedure. Initial ultrasound scanning demonstrates a large amount of ascites within the right lower abdominal quadrant. The right lower abdomen was prepped and draped in the usual sterile fashion. 1% lidocaine was used for local anesthesia. Following this, a 19 gauge, 7-cm, Yueh catheter was introduced. An ultrasound image was saved for documentation purposes. The paracentesis was performed. The catheter was removed and a dressing was applied. The patient tolerated the procedure well without immediate post procedural complication. FINDINGS: A total of approximately 7.0L of yellow ascitic fluid was removed. IMPRESSION: Successful ultrasound-guided paracentesis yielding 7.0L liters of peritoneal fluid. Procedure performed by; Sheliah Plane IR PA PLAN: The patient has previously been formally evaluated by the [facility] Interventional Radiology Portal Hypertension Clinic and is being actively followed for potential future intervention. Roanna Banning, MD [facility] Radiology Electronically Signed   By: Roanna Banning M.D.   On: 06/29/2022 11:43   US Venous Img Lower Bilateral (DVT)  Result Date: 06/28/2022 CLINICAL DATA:  Lower extremity edema.  Ascites. EXAM: BILATERAL LOWER EXTREMITY VENOUS DOPPLER ULTRASOUND TECHNIQUE: Gray-scale sonography with compression, as well as color and duplex ultrasound, were performed to evaluate the deep venous system(s) from the level of the common femoral vein  through the popliteal and proximal calf veins. COMPARISON:  None Available. FINDINGS: VENOUS Normal compressibility of the common femoral, superficial femoral, and popliteal veins, as well as the visualized calf veins. Visualized portions of profunda femoral vein and great saphenous vein unremarkable. No filling defects to suggest DVT on grayscale or color Doppler imaging. Doppler waveforms show normal direction of venous flow, normal respiratory  plasticity and response to augmentation. OTHER Subcutaneous edema noted in the lower extremities bilaterally. Limitations: Study mildly limited due to lower extremity edema and patient discomfort. IMPRESSION: Negative for DVT in either lower extremity. Electronically Signed   By: Kennith Center M.D.   On: 06/28/2022 06:24   CT ABDOMEN PELVIS W CONTRAST  Result Date: 06/28/2022 CLINICAL DATA:  Abdominal pain, acute, nonlocalized. Alcoholic cirrhosis. EXAM: CT ABDOMEN AND PELVIS WITH CONTRAST TECHNIQUE: Multidetector CT imaging of the abdomen and pelvis was performed using the standard protocol following bolus administration of intravenous contrast. RADIATION DOSE REDUCTION: This exam was performed according to the departmental dose-optimization program which includes automated exposure control, adjustment of the mA and/or kV according to patient size and/or use of iterative reconstruction technique. CONTRAST:  OMNIPAQUE IOHEXOL 300 MG/ML  SOLN COMPARISON:  None Available. FINDINGS: Lower chest: Mild bibasilar dependent atelectasis, right greater than left. Gastroesophageal varices noted adjacent to the distal esophagus. Cardiac size within normal limits. Gynecomastia noted Hepatobiliary: The liver is shrunken and nodular in keeping with changes of cirrhosis. A slightly hyperenhancing 2 cm nodule is seen within the subserosal left hepatic lobe at axial image # 16/3, not well characterized on this examination. This may represent a dysplastic or regenerative nodule, however, a focal hepatoma is not excluded. No intra or extrahepatic biliary ductal dilation. There is recanalization of the umbilical vein which ultimately communicates to the common femoral veins. Cholelithiasis noted without definite evidence of acute cholecystitis. Pancreas: 13 mm cystic lesion within the a head neck juncture of the pancreas is indeterminate, possibly representing a pancreatic cyst, cystic neoplasm, or dilated pancreatic side  branch. The pancreas is otherwise unremarkable. Spleen: The spleen is markedly enlarged measuring up to 20 cm in greatest dimension. No intrasplenic lesions are seen Adrenals/Urinary Tract: The adrenal glands are unremarkable. The kidneys are normal. Foley catheter balloon seen within a decompressed bladder lumen. Stomach/Bowel: Small varices are seen within the gastric cardia. There is circumferential bowel wall thickening involving the gastric antrum, duodenum, and proximal jejunum which is nonspecific, but can be seen in the setting of portal venous hypertension. Inflammatory conditions, however, such as infectious gastroenteritis could appear similarly, however. There is, additionally, circumferential bowel wall thickening involving the rectum and colon with mild pericolonic inflammatory stranding, best appreciated involving the mid sigmoid colon. While changes of portal colopathy can be seen these typically preferentially affect the right colon and the extent favors the presence of an underlying infectious or inflammatory proctocolitis. No evidence of obstruction. Large volume ascites. No free intraperitoneal gas. Vascular/Lymphatic: Main portal vein is patent. Evidence of portal venous hypertension with gastroesophageal varices and portosystemic collateralization via the umbilical vein to the common femoral veins is noted. Mild aortoiliac atherosclerotic calcification. No aortic aneurysm. No pathologic adenopathy within the abdomen and pelvis. Reproductive: Prostate is unremarkable. Other: Small fluid containing umbilical hernia. Moderate diffuse subcutaneous body wall edema noted. Musculoskeletal: No acute bone abnormality. No lytic or blastic bone lesion IMPRESSION: 1. Morphologic changes in keeping with cirrhosis and portal venous hypertension with marked splenomegaly, gastroesophageal varices, and portosystemic collateralization via the umbilical vein to the common femoral  veins. Large volume ascites. 2. 2  cm hyperenhancing nodule within the subserosal left hepatic lobe, not well characterized on this examination. This may represent a dysplastic or regenerative nodule, however, a focal hepatoma is not excluded. Correlation with serum AFP level and/or dedicated contrast enhanced MRI examination is recommended for further evaluation. 3. Circumferential bowel wall thickening involving the gastric antrum, duodenum, proximal jejunum,, possibly representing changes of portal venous hypertension and resultant edema versus is infectious or inflammatory conditions such as infectious gastroenteritis. 4. Thickening involving the rectum and colon with mild pericolonic inflammatory stranding, best appreciated involving the mid sigmoid colon. While changes of portal colopathy can be seen in the setting of portal venous hypertension, these conditions typically preferentially affect the right colon and the extent favors the presence of an infectious or inflammatory proctocolitis. 5. 13 mm cystic lesion within the head neck juncture of the pancreas. This may represent a pancreatic cyst, cystic neoplasm, or dilated pancreatic side branch. This could be further assessed with MRI examination once the patient's acute issues have resolved. 6. Cholelithiasis. Electronically Signed   By: Helyn Numbers M.D.   On: 06/28/2022 04:11   DG Chest Portable 1 View  Result Date: 06/28/2022 CLINICAL DATA:  Dyspnea EXAM: PORTABLE CHEST 1 VIEW COMPARISON:  06/01/2022 FINDINGS: Lungs volumes are small and there is progressive elevation of the right hemidiaphragm. No pneumothorax or pleural effusion. Cardiac size within normal limits. Pulmonary vascularity is normal. Osseous structures are age-appropriate. No acute bone abnormality. IMPRESSION: Pulmonary hypoinflation. Progressive elevation of the right hemidiaphragm. Electronically Signed   By: Helyn Numbers M.D.   On: 06/28/2022 02:48   US Paracentesis  Result Date: 06/15/2022 INDICATION:  Patient with history of cirrhosis and recurrent ascites request received for therapeutic paracentesis. EXAM: ULTRASOUND GUIDED RLQ PARACENTESIS MEDICATIONS: None. COMPLICATIONS: None immediate. PROCEDURE: Informed written consent was obtained from the patient after a discussion of the risks, benefits and alternatives to treatment. A timeout was performed prior to the initiation of the procedure. Initial ultrasound scanning demonstrates a large amount of ascites within the right lower abdominal quadrant. The right lower abdomen was prepped and draped in the usual sterile fashion. 1% lidocaine was used for local anesthesia. Following this, a 19 gauge, 7-cm, Yueh catheter was introduced. An ultrasound image was saved for documentation purposes. The paracentesis was performed. The catheter was removed and a dressing was applied. The patient tolerated the procedure well without immediate post procedural complication. FINDINGS: A total of approximately 5.8L of ascitic fluid was removed. IMPRESSION: Successful ultrasound-guided paracentesis yielding 5.8L liters of peritoneal fluid. PLAN: The patient has required >/=2 paracenteses in a 30 day period and a formal evaluation by the [facility] Interventional Radiology Portal Hypertension Clinic has been arranged. Electronically Signed   By: Acquanetta Belling M.D.   On: 06/15/2022 14:41   US Paracentesis  Result Date: 06/11/2022 INDICATION: Patient with history of cirrhosis and recurrent ascites request received for therapeutic paracentesis. EXAM: ULTRASOUND GUIDED  PARACENTESIS MEDICATIONS: Local 1% lidocaine only. COMPLICATIONS: None immediate. PROCEDURE: Informed written consent was obtained from the patient after a discussion of the risks, benefits and alternatives to treatment. A timeout was performed prior to the initiation of the procedure. Initial ultrasound scanning demonstrates a large amount of ascites within the right upper abdominal quadrant. The right upper abdomen  was prepped and draped in the usual sterile fashion. 1% lidocaine was used for local anesthesia. Following this, a 19 gauge, 7-cm, Yueh catheter was introduced. An ultrasound image was saved for documentation  purposes. The paracentesis was performed. The catheter was removed and a dressing was applied. The patient tolerated the procedure well without immediate post procedural complication. FINDINGS: A total of approximately 4.5 L of clear yellow fluid was removed. IMPRESSION: Successful ultrasound-guided paracentesis yielding 4.5 liters of peritoneal fluid. PLAN: The patient has required >/=2 paracenteses in a 30 day period and a formal evaluation by the [facility] Interventional Radiology Portal Hypertension Clinic has been arranged. This exam was performed by Pattricia Boss PA-C, and was supervised and interpreted by Dr. Juliette Alcide. Electronically Signed   By: Olive Bass M.D.   On: 06/11/2022 13:39   US Paracentesis  Result Date: 06/08/2022 INDICATION: Patient with history of alcoholic cirrhosis with recurrent ascites. Request is for therapeutic and diagnostic paracentesis EXAM: ULTRASOUND GUIDED THERAPEUTIC AND DIAGNOSTIC PARACENTESIS MEDICATIONS: Lidocaine 1% 10 mL COMPLICATIONS: None immediate. PROCEDURE: Informed written consent was obtained from the patient after a discussion of the risks, benefits and alternatives to treatment. A timeout was performed prior to the initiation of the procedure. Initial ultrasound scanning demonstrates a moderate amount of ascites within the right lower abdominal quadrant. The right lower abdomen was prepped and draped in the usual sterile fashion. 1% lidocaine was used for local anesthesia. Following this, a 19 gauge, 7-cm, Yueh catheter was introduced. An ultrasound image was saved for documentation purposes. The paracentesis was performed. The catheter was removed and a dressing was applied. The patient tolerated the procedure well without immediate post procedural  complication. FINDINGS: A total of approximately 4.4 L of straw-colored fluid was removed. Samples were sent to the laboratory as requested by the clinical team. IMPRESSION: Successful ultrasound-guided therapeutic and diagnostic paracentesis yielding 4.4 liters of peritoneal fluid. Read by: Anders Grant, NP PLAN: The patient has required >/=2 paracenteses in a 30 day period and a formal evaluation by the [facility] Interventional Radiology Portal Hypertension Clinic has been arranged. Electronically Signed   By: Marliss Coots M.D.   On: 06/08/2022 12:59   US Paracentesis  Result Date: 06/08/2022 INDICATION: Patient with history of ETOH cirrhosis, recurrent ascites. Request to IR for therapeutic paracentesis. EXAM: ULTRASOUND GUIDED THERAPEUTIC PARACENTESIS MEDICATIONS: 7 mL 1% lidocaine COMPLICATIONS: None immediate. PROCEDURE: Informed written consent was obtained from the patient after a discussion of the risks, benefits and alternatives to treatment. A timeout was performed prior to the initiation of the procedure. Initial ultrasound scanning demonstrates a large amount of ascites within the left lower abdominal quadrant. The left lower abdomen was prepped and draped in the usual sterile fashion. 1% lidocaine was used for local anesthesia. Following this, a 19 gauge, 7-cm, Yueh catheter was introduced. An ultrasound image was saved for documentation purposes. The paracentesis was performed. The catheter was removed and a dressing was applied. The patient tolerated the procedure well without immediate post procedural complication. Patient received post-procedure intravenous albumin; see nursing notes for details. FINDINGS: A total of approximately 6.0 L of clear yellow fluid was removed. IMPRESSION: Successful ultrasound-guided paracentesis yielding 6.0 liters of peritoneal fluid. PLAN: The patient has required >/=2 paracenteses in a 30 day period and a formal evaluation by the [facility] Interventional  Radiology Portal Hypertension Clinic has been arranged. Electronically Signed   By: Olive Bass M.D.   On: 06/08/2022 07:29    Microbiology: Results for orders placed or performed during the hospital encounter of 06/28/22  Urine Culture     Status: None   Collection Time: 06/28/22  2:14 AM   Specimen: Urine, Catheterized  Result Value Ref Range Status  Specimen Description   Final    URINE, CATHETERIZED Performed at [facility], [address] [place], [place] [address]    Special Requests   Final    NONE Performed at [facility], [address] [place], [place] [address]    Culture   Final    NO GROWTH Performed at [facility], [address] [place], [place] [address]    Report Status 06/29/2022 FINAL  Final  Blood culture (routine x 2)     Status: Abnormal   Collection Time: 06/28/22  2:20 AM   Specimen: BLOOD  Result Value Ref Range Status   Specimen Description   Final    BLOOD LEFT ANTECUBITAL Performed at [facility], [address], [place], [place] [address]    Special Requests   Final    BOTTLES DRAWN AEROBIC AND ANAEROBIC Blood Culture adequate volume Performed at [facility], [address], [place], [place] [address]    Culture  Setup Time   Final    GRAM NEGATIVE RODS IN BOTH AEROBIC AND ANAEROBIC BOTTLES CRITICAL RESULT CALLED TO, READ BACK BY AND VERIFIED WITH: RACHEL RODRIGUEZ GUZMAN 06/28/22 @ 1248 BY SB Performed at [facility], [address] [place], [place] [address]    Culture (A)  Final    ESCHERICHIA COLI SUSCEPTIBILITIES PERFORMED ON PREVIOUS CULTURE WITHIN THE LAST 5 DAYS. Performed at [facility], [address] [place], [place] [address]    Report Status 06/30/2022 FINAL  Final  Blood culture (routine x 2)     Status: Abnormal   Collection Time: 06/28/22  2:30 AM   Specimen: BLOOD  Result Value Ref Range Status   Specimen Description   Final    BLOOD BLOOD RIGHT  HAND Performed at [facility], [address] [place], [place] [address]    Special Requests   Final    BOTTLES DRAWN AEROBIC AND ANAEROBIC Blood Culture results may not be optimal due to an excessive volume of blood received in culture bottles Performed at [facility], [address], [place], [place] [address]    Culture  Setup Time   Final    GRAM NEGATIVE RODS IN BOTH AEROBIC AND ANAEROBIC BOTTLES CRITICAL RESULT CALLED TO, READ BACK BY AND VERIFIED WITH: RACHEL RODRIGUEZ GUZMAN 06/28/22 @ 1248 BY SB Performed at [facility], [address] [place], [place] [address]    Culture ESCHERICHIA COLI (A)  Final   Report Status 06/30/2022 FINAL  Final   Organism ID, Bacteria ESCHERICHIA COLI  Final      Susceptibility   Escherichia coli - MIC*    AMPICILLIN >=32 RESISTANT Resistant     CEFAZOLIN <=4 SENSITIVE Sensitive     CEFEPIME <=0.12 SENSITIVE Sensitive     CEFTAZIDIME <=1 SENSITIVE Sensitive     CEFTRIAXONE <=0.25 SENSITIVE Sensitive     CIPROFLOXACIN >=4 RESISTANT Resistant     GENTAMICIN <=1 SENSITIVE Sensitive     IMIPENEM <=0.25 SENSITIVE Sensitive     TRIMETH/SULFA >=320 RESISTANT Resistant     AMPICILLIN/SULBACTAM 16 INTERMEDIATE Intermediate     PIP/TAZO <=4 SENSITIVE Sensitive     * ESCHERICHIA COLI  Blood Culture ID Panel (Reflexed)     Status: Abnormal   Collection Time: 06/28/22  2:30 AM  Result Value Ref Range Status   Enterococcus faecalis NOT DETECTED NOT DETECTED Final   Enterococcus Faecium NOT DETECTED NOT DETECTED Final   Listeria monocytogenes NOT  DETECTED NOT DETECTED Final   Staphylococcus species NOT DETECTED NOT DETECTED Final   Staphylococcus aureus (BCID) NOT DETECTED NOT DETECTED Final   Staphylococcus epidermidis NOT DETECTED NOT DETECTED Final   Staphylococcus lugdunensis NOT DETECTED NOT DETECTED Final   Streptococcus species NOT DETECTED NOT DETECTED Final   Streptococcus agalactiae NOT DETECTED NOT DETECTED Final    Streptococcus pneumoniae NOT DETECTED NOT DETECTED Final   Streptococcus pyogenes NOT DETECTED NOT DETECTED Final   A.calcoaceticus-baumannii NOT DETECTED NOT DETECTED Final   Bacteroides fragilis NOT DETECTED NOT DETECTED Final   Enterobacterales DETECTED (A) NOT DETECTED Final    Comment: Enterobacterales represent a large order of gram negative bacteria, not a single organism. Refer to culture for further identification. CRITICAL RESULT CALLED TO, READ BACK BY AND VERIFIED WITH: RACHEL RODRIGUES GUZMAN 06/28/22@ 1248 BY SB    Enterobacter cloacae complex NOT DETECTED NOT DETECTED Final   Escherichia coli NOT DETECTED NOT DETECTED Final   Klebsiella aerogenes NOT DETECTED NOT DETECTED Final   Klebsiella oxytoca NOT DETECTED NOT DETECTED Final   Klebsiella pneumoniae NOT DETECTED NOT DETECTED Final   Proteus species NOT DETECTED NOT DETECTED Final   Salmonella species NOT DETECTED NOT DETECTED Final   Serratia marcescens NOT DETECTED NOT DETECTED Final   Haemophilus influenzae NOT DETECTED NOT DETECTED Final   Neisseria meningitidis NOT DETECTED NOT DETECTED Final   Pseudomonas aeruginosa NOT DETECTED NOT DETECTED Final   Stenotrophomonas maltophilia NOT DETECTED NOT DETECTED Final   Candida albicans NOT DETECTED NOT DETECTED Final   Candida auris NOT DETECTED NOT DETECTED Final   Candida glabrata NOT DETECTED NOT DETECTED Final   Candida krusei NOT DETECTED NOT DETECTED Final   Candida parapsilosis NOT DETECTED NOT DETECTED Final   Candida tropicalis NOT DETECTED NOT DETECTED Final   Cryptococcus neoformans/gattii NOT DETECTED NOT DETECTED Final   CTX-M ESBL NOT DETECTED NOT DETECTED Final   Carbapenem resistance IMP NOT DETECTED NOT DETECTED Final   Carbapenem resistance KPC NOT DETECTED NOT DETECTED Final   Carbapenem resistance NDM NOT DETECTED NOT DETECTED Final   Carbapenem resist OXA 48 LIKE NOT DETECTED NOT DETECTED Final   Carbapenem resistance VIM NOT DETECTED NOT  DETECTED Final    Comment: Performed at [facility], [address] [place], [place] [address]  Body fluid culture w Gram Stain     Status: None   Collection Time: 06/28/22  2:45 PM   Specimen: PATH Cytology Peritoneal fluid  Result Value Ref Range Status   Specimen Description   Final    PERITONEAL Performed at [facility], [address], [place], [place] [address]    Special Requests   Final    NONE Performed at [facility], [address] [place], [place] [address]    Gram Stain   Final    MODERATE WBC PRESENT,BOTH PMN AND MONONUCLEAR NO ORGANISMS SEEN    Culture   Final    NO GROWTH 3 DAYS Performed at [facility], [address], [place], [place] [address]    Report Status 07/01/2022 FINAL  Final    Labs: CBC: Recent Labs  Lab 06/28/22 0214 06/28/22 0748 06/29/22 0502 06/30/22 0454 07/01/22 0413  WBC 5.4 5.3 4.3 5.2 4.6  NEUTROABS 5.0 4.8  --   --   --   HGB 11.8* 10.7* 8.8* 9.5* 9.9*  HCT 36.2* 32.3* 27.3* 28.8* 29.5*  MCV 98.4 98.2 99.6 96.3 95.8  PLT 71* 55* 37* 48* 45*  Basic Metabolic Panel: Recent Labs  Lab 06/28/22 0214 06/28/22 0748 06/29/22 0502 06/30/22 0454 07/01/22 0413  NA 133* 132* 131* 131* 130*  K 3.6 3.5 3.4* 3.4* 3.7  CL 103 101 103 104 104  CO2 21* 21* 25 21* 23  GLUCOSE 135* 106* 124* 145* 137*  BUN 10 11 15 11 11   CREATININE 0.69 0.58* 0.69 0.62 0.51*  CALCIUM 7.9* 7.6* 7.5* 7.5* 7.5*  MG 1.4* 1.6* 1.6* 1.8 1.7  PHOS  --   --  2.2* 1.7* 2.2*   Liver Function Tests: Recent Labs  Lab 06/28/22 0214 06/28/22 0748 06/29/22 0502  AST 73* 57* 36  ALT 47* 38 28  ALKPHOS 125 103 71  BILITOT 9.5* 8.8* 5.4*  PROT 5.7* 5.2* 4.6*  ALBUMIN 2.5* 2.3* 2.1*   CBG: Recent Labs  Lab 06/28/22 0218 06/28/22 0817 06/29/22 0723 06/30/22 0753 07/01/22 0800  GLUCAP 140* 104* 115* 144* 130*    Discharge time spent: greater than 30 minutes.  Signed: 08/31/22, MD [facility] 07/03/2022

## 2022-07-03 NOTE — Progress Notes (Signed)
Pt refused wound care dressing change

## 2022-07-03 NOTE — Progress Notes (Signed)
Patient discharged via [facility] with all belongings. Patient expressed full understanding of AVS and medications. PIVX2 removed with cathter intact.

## 2022-07-03 NOTE — TOC Progression Note (Signed)
Transition of Care [facility]) - Progression Note    Patient Details  Name: Marco Spears MRN: [id number] Date of Birth: 1976/08/19  Transition of Care [facility]) CM/SW Contact  Chapman Fitch, RN Phone Number: 07/03/2022, 9:40 AM  Clinical Narrative:    TOC following for discharge medication needs, anticipated patient will require taxi voucher at discharge   Expected Discharge Plan: Home/Self Care Barriers to Discharge: Continued Medical Work up  Expected Discharge Plan and Services Expected Discharge Plan: Home/Self Care       Living arrangements for the past 2 months: Single Family Home                                       Social Determinants of Health (SDOH) Interventions    Readmission Risk Interventions    05/06/2022    2:26 PM  Readmission Risk Prevention Plan  Transportation Screening Complete  Medication Review [employer]) Complete

## 2022-07-03 NOTE — TOC Transition Note (Signed)
Transition of Care [facility]) - CM/SW Discharge Note   Patient Details  Name: Marco Spears MRN: [id number] Date of Birth: 05/22/1976  Transition of Care [facility]) CM/SW Contact:  Chapman Fitch, RN Phone Number: 07/03/2022, 12:37 PM   Clinical Narrative:     Patient to discharge today DC meds to me filled at [facility] outpatient pharmacy.  Pharmacist to deliver to bedside prior to dc Cab voucher on chart.  Patient states he will be going to his brothers boarding house to stay one night     Barriers to Discharge: Continued Medical Work up   Patient Goals and CMS Choice Patient states their goals for this hospitalization and ongoing recovery are:: to go home CMS Medicare.gov Compare Post Acute Care list provided to:: Patient Choice offered to / list presented to : Patient  Discharge Placement                       Discharge Plan and Services                                     Social Determinants of Health (SDOH) Interventions     Readmission Risk Interventions    05/06/2022    2:26 PM  Readmission Risk Prevention Plan  Transportation Screening Complete  Medication Review [employer]) Complete

## 2022-07-03 NOTE — Plan of Care (Signed)
  Problem: Education: Goal: Knowledge of General Education information will improve Description: Including pain rating scale, medication(s)/side effects and non-pharmacologic comfort measures 07/03/2022 1331 by Ellis Parents, RN Outcome: Adequate for Discharge 07/03/2022 1018 by Ellis Parents, RN Outcome: Progressing   Problem: Health Behavior/Discharge Planning: Goal: Ability to manage health-related needs will improve 07/03/2022 1331 by Ellis Parents, RN Outcome: Adequate for Discharge 07/03/2022 1018 by Ellis Parents, RN Outcome: Progressing   Problem: Clinical Measurements: Goal: Ability to maintain clinical measurements within normal limits will improve 07/03/2022 1331 by Ellis Parents, RN Outcome: Adequate for Discharge 07/03/2022 1018 by Ellis Parents, RN Outcome: Progressing Goal: Will remain free from infection 07/03/2022 1331 by Ellis Parents, RN Outcome: Adequate for Discharge 07/03/2022 1018 by Ellis Parents, RN Outcome: Progressing Goal: Diagnostic test results will improve 07/03/2022 1331 by Ellis Parents, RN Outcome: Adequate for Discharge 07/03/2022 1018 by Ellis Parents, RN Outcome: Progressing Goal: Respiratory complications will improve 07/03/2022 1331 by Ellis Parents, RN Outcome: Adequate for Discharge 07/03/2022 1018 by Ellis Parents, RN Outcome: Progressing Goal: Cardiovascular complication will be avoided 07/03/2022 1331 by Ellis Parents, RN Outcome: Adequate for Discharge 07/03/2022 1018 by Ellis Parents, RN Outcome: Progressing

## 2022-07-03 NOTE — Plan of Care (Signed)
  Problem: Education: Goal: Knowledge of General Education information will improve Description: Including pain rating scale, medication(s)/side effects and non-pharmacologic comfort measures Outcome: Progressing   Problem: Health Behavior/Discharge Planning: Goal: Ability to manage health-related needs will improve Outcome: Progressing   Problem: Health Behavior/Discharge Planning: Goal: Ability to manage health-related needs will improve Outcome: Progressing   Problem: Clinical Measurements: Goal: Ability to maintain clinical measurements within normal limits will improve Outcome: Progressing Goal: Will remain free from infection Outcome: Progressing Goal: Diagnostic test results will improve Outcome: Progressing Goal: Respiratory complications will improve Outcome: Progressing Goal: Cardiovascular complication will be avoided Outcome: Progressing   

## 2022-07-06 ENCOUNTER — Emergency Department
Admission: EM | Admit: 2022-07-06 | Discharge: 2022-07-06 | Disposition: A | Discharge status: Home / Self Care | Payer: Medicaid Other | Attending: Emergency Medicine | Admitting: Emergency Medicine

## 2022-07-06 DIAGNOSIS — R1900 Intra-abdominal and pelvic swelling, mass and lump, unspecified site: Secondary | ICD-10-CM | POA: Diagnosis not present

## 2022-07-06 DIAGNOSIS — M7989 Other specified soft tissue disorders: Secondary | ICD-10-CM | POA: Diagnosis present

## 2022-07-06 DIAGNOSIS — R600 Localized edema: Secondary | ICD-10-CM | POA: Diagnosis not present

## 2022-07-06 LAB — COMPREHENSIVE METABOLIC PANEL
ALT: 43 U/L (ref 0–44)
AST: 72 U/L — ABNORMAL HIGH (ref 15–41)
Albumin: 2.8 g/dL — ABNORMAL LOW (ref 3.5–5.0)
Alkaline Phosphatase: 126 U/L (ref 38–126)
Anion gap: 10 (ref 5–15)
BUN: 16 mg/dL (ref 6–20)
CO2: 27 mmol/L (ref 22–32)
Calcium: 8.3 mg/dL — ABNORMAL LOW (ref 8.9–10.3)
Chloride: 90 mmol/L — ABNORMAL LOW (ref 98–111)
Creatinine, Ser: 0.76 mg/dL (ref 0.61–1.24)
GFR, Estimated: >60 mL/min (ref >60)
Glucose, Bld: 112 mg/dL — ABNORMAL HIGH (ref 70–99)
Potassium: 3.2 mmol/L — ABNORMAL LOW (ref 3.5–5.1)
Sodium: 127 mmol/L — ABNORMAL LOW (ref 135–145)
Total Bilirubin: 7.8 mg/dL — ABNORMAL HIGH (ref 0.3–1.2)
Total Protein: 6.4 g/dL — ABNORMAL LOW (ref 6.5–8.1)

## 2022-07-06 LAB — URINALYSIS, ROUTINE W REFLEX MICROSCOPIC
Bilirubin Urine: NEGATIVE
Glucose, UA: NEGATIVE mg/dL
Hgb urine dipstick: NEGATIVE
Ketones, ur: NEGATIVE mg/dL
Leukocytes,Ua: NEGATIVE
Nitrite: NEGATIVE
Protein, ur: NEGATIVE mg/dL
Specific Gravity, Urine: 1.01 (ref 1.005–1.030)
pH: 5 (ref 5.0–8.0)

## 2022-07-06 LAB — CBC
HCT: 29.8 % — ABNORMAL LOW (ref 39.0–52.0)
Hemoglobin: 10.1 g/dL — ABNORMAL LOW (ref 13.0–17.0)
MCH: 31.7 pg (ref 26.0–34.0)
MCHC: 33.9 g/dL (ref 30.0–36.0)
MCV: 93.4 fL (ref 80.0–100.0)
Platelets: 99 10*3/uL — ABNORMAL LOW (ref 150–400)
RBC: 3.19 MIL/uL — ABNORMAL LOW (ref 4.22–5.81)
RDW: 17.8 % — ABNORMAL HIGH (ref 11.5–15.5)
WBC: 8 10*3/uL (ref 4.0–10.5)
nRBC: 0 % (ref 0.0–0.2)

## 2022-07-06 LAB — LIPASE, BLOOD: Lipase: 35 U/L (ref 11–51)

## 2022-07-06 MED ORDER — POTASSIUM CHLORIDE CRYS ER 20 MEQ PO TBCR
40.0000 meq | EXTENDED_RELEASE_TABLET | Freq: Once | ORAL | Status: AC
  Administered 2022-07-06: 40 meq via ORAL
  Filled 2022-07-06: qty 2

## 2022-07-06 NOTE — Discharge Instructions (Signed)
Please continue to take your furosemide spironolactone and the antibiotic.

## 2022-07-06 NOTE — ED Triage Notes (Signed)
Pt stated he used meth and thc 2 days prior

## 2022-07-06 NOTE — ED Triage Notes (Signed)
Abd pain , abd swelling , liver failure patient , leg swelling bilateral

## 2022-07-06 NOTE — ED Provider Notes (Signed)
[facility] Provider Note    Event Date/Time   First MD Initiated Contact with Patient 07/06/22 1609     (approximate)   History   Abdominal Pain (Liver patient , abd pain , abd swelling )   HPI  Marco Spears is a 46 y.o. male with past medical history of polysubstance use, alcoholic liver disease with cirrhosis, hep C, DVT, history of right knee septic arthritis, ESBL E. coli bacteremia and SBP who presents today with abdominal swelling and lower extremity swelling.  Patient tells me he is here because he is supposed to have a paracentesis.  Tells me that at discharge they told him to come to the emergency department.  He is not sure whether he had an appointment.  He denies significant abdominal swelling.  Does endorse significant lower extremity swelling he says that this is not much different from when he left the hospital 3 days ago.  Denies fevers or chills.  Denies abdominal pain.  Did have some vomiting earlier today.  Has been taking the furosemide spironolactone and the antibiotic.  [place] the [place].  Tells me that he does not have a home.  Tells me that he has not been to a shelter because he does not currently have an ID.  I see from social work notes that patient was going to stay with his brother at the time of discharge last time he was here.  Patient does endorse ongoing meth use.  Has not used in 2 days but has used since he left the hospital.  Reviewed patient's last admission.  He was discharged 3 days ago after being admitted for severe sepsis found to have E. coli bacteremia and recurrent SBP.  He was on IV antibiotics switched to cefadroxil 1 g twice daily and then to 500 mg twice daily for prophylaxis.     Past Medical History:  Diagnosis Date   Cirrhosis of liver (HCC)    Heart murmur    Polysubstance abuse (HCC)    Septic bursitis     Patient Active Problem List   Diagnosis Date Noted   Malnutrition of moderate  degree 06/11/2022   Portal hypertension (HCC) 06/02/2022   Bacteremia 06/02/2022   AMS (altered mental status) 06/01/2022   Thrombocytopenia (HCC) 06/01/2022   Septic bursitis 05/22/2022   Septic arthritis (HCC) 05/21/2022   Alcohol abuse 05/21/2022   On anticoagulant therapy 05/21/2022   Hypocalcemia 05/21/2022   Tongue lesion 05/21/2022   Cellulitis of right leg    Cirrhosis of liver without ascites (HCC)    Polysubstance abuse (HCC)    Cellulitis 05/05/2022   Sepsis (HCC) 05/05/2022   Alcoholic cirrhosis of liver with ascites (HCC) 04/21/2022   Hypokalemia 04/21/2022   Anemia 04/21/2022     Physical Exam  Triage Vital Signs: ED Triage Vitals  Enc Vitals Group     BP 07/06/22 1415 135/73     Pulse Rate 07/06/22 1415 (!) 105     Resp 07/06/22 1415 17     Temp 07/06/22 1415 98.9 F (37.2 C)     Temp Source 07/06/22 1415 Oral     SpO2 07/06/22 1415 100 %     Weight 07/06/22 1416 264 lb 8.8 oz (120 kg)     Height 07/06/22 1416 6\' 2"  (1.88 m)     Head Circumference --      Peak Flow --      Pain Score 07/06/22 1415 6  Pain Loc --      Pain Edu? --      Excl. in GC? --     Most recent vital signs: Vitals:   07/06/22 1415  BP: 135/73  Pulse: (!) 105  Resp: 17  Temp: 98.9 F (37.2 C)  SpO2: 100%     General: Awake, no distress.  CV:  Good peripheral perfusion.  Significant bilateral pitting edema right greater than left, there is a wound over the patella that is well granulated with mild erythema around it Resp:  Normal effort.  No increased work of breathing Abd:  No distention.  Abdomen is soft nondistended nontender Neuro:             Awake, Alert, Oriented x 3 patient ambulates with steady gait Other:     ED Results / Procedures / Treatments  Labs (all labs ordered are listed, but only abnormal results are displayed) Labs Reviewed  COMPREHENSIVE METABOLIC PANEL - Abnormal; Notable for the following components:      Result Value   Sodium 127 (*)     Potassium 3.2 (*)    Chloride 90 (*)    Glucose, Bld 112 (*)    Calcium 8.3 (*)    Total Protein 6.4 (*)    Albumin 2.8 (*)    AST 72 (*)    Total Bilirubin 7.8 (*)    All other components within normal limits  CBC - Abnormal; Notable for the following components:   RBC 3.19 (*)    Hemoglobin 10.1 (*)    HCT 29.8 (*)    RDW 17.8 (*)    Platelets 99 (*)    All other components within normal limits  LIPASE, BLOOD  URINALYSIS, ROUTINE W REFLEX MICROSCOPIC     EKG     RADIOLOGY    PROCEDURES:  Critical Care performed: No  .1-3 Lead EKG Interpretation  Performed by: Georga Hacking, MD Authorized by: Georga Hacking, MD     Interpretation: abnormal     ECG rate assessment: tachycardic     Rhythm: sinus tachycardia     Ectopy: none     Conduction: normal     The patient is on the cardiac monitor to evaluate for evidence of arrhythmia and/or significant heart rate changes.   MEDICATIONS ORDERED IN ED: Medications  potassium chloride SA (KLOR-CON M) CR tablet 40 mEq (40 mEq Oral Given 07/06/22 1719)     IMPRESSION / MDM / ASSESSMENT AND PLAN / ED COURSE  I reviewed the triage vital signs and the nursing notes.                              Patient's presentation is most consistent with acute presentation with potential threat to life or bodily function.  Differential diagnosis includes, but is not limited to, decompensated cirrhosis, medication noncompliance, venous stasis, anasarca, hepatic encephalopathy  Patient is a 46 year old male who has had multiple recent admissions for decompensated cirrhosis, SBP and bacteremia.  Most recently discharged 3 days ago after he was found to have E. coli bacteremia from SBP.  He has chronic lower extremity edema and a chronic wound on the right prepatellar region.  He was discharged on furosemide spironolactone and an antibiotic for SBP prophylaxis.  Patient tells me he is here today for a paracentesis.  He tells me  he was told to come to the ER for this on Monday.  He is not  sure whether he had an appointment.  I do not see any notification of this in his discharge summary.  Do not believe he was told to come to the ER for paracentesis as this is not how that typically works.  He denies really any worsening abdominal swelling does endorse lower extremity edema but says this has been chronic since he left the hospital has not really changed much.  Denies shortness of breath.  Denies abdominal pain or fevers.  Did have some vomiting prior to coming.  He is currently homeless and is staying in a camper in the woods.  He did get his medications prior to leaving the hospital and is taking furosemide spironolactone and antibiotic.  Patient's vital signs are notable for mild tachycardia otherwise within normal limits.  He looks chronically ill but he is nontoxic he does not appear altered he is ambulating with steady gait normal mental status alert and oriented x3.  His abdomen is actually not distended is not tender do not think that he requires urgent paracentesis for diagnosis or therapeutic purposes at this time.  He does have lower extremity edema and looks like venous stasis changes.  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  Suspect that this is contributing to his edema in addition to his cirrhosis.  Work shows hyponatremia to 127, he was discharged with it around 130.  He is mildly hypokalemic with potassium 3.2.  LFTs are near baseline.  Hemoglobin is near baseline.  Lipase is negative.  He was given oral potassium.  Ultimately I do not think that he requires admission at this time I do not think that he is encephalopathic do not think that he requires urgent paracentesis and I have low suspicion for SBP at this time.  Do not think that he is septic.  Social situation is unfortunate.  I reviewed the social work notes from  his admission and it looks like patient was going to his brother's house.  Patient tells me that he was staying in his brother's boardinghouse I asked if he can go there today he says it is too late.  Asked if he can go there tomorrow and he said he would likely be able to.      FINAL CLINICAL IMPRESSION(S) / ED DIAGNOSES   Final diagnoses:  Lower extremity edema     Rx / DC Orders   ED Discharge Orders     None        Note:  This document was prepared using Dragon voice recognition software and may include unintentional dictation errors.   Georga Hacking, MD 07/06/22 [phone]

## 2022-07-06 NOTE — ED Notes (Signed)
PT unable to find ride. Attempt to call taxi with no answer. Pt states he doesn't know anyone else to come and he doesn't have any money.

## 2022-07-09 ENCOUNTER — Encounter: Payer: Self-pay | Admitting: Emergency Medicine

## 2022-07-09 ENCOUNTER — Other Ambulatory Visit: Payer: Self-pay

## 2022-07-09 ENCOUNTER — Observation Stay: Payer: Medicaid Other

## 2022-07-09 ENCOUNTER — Emergency Department: Payer: Medicaid Other

## 2022-07-09 ENCOUNTER — Inpatient Hospital Stay
Admission: EM | Admit: 2022-07-09 | Discharge: 2022-07-16 | DRG: 433 | Disposition: A | Discharge status: Home / Self Care | Payer: Medicaid Other | Attending: Internal Medicine | Admitting: Internal Medicine

## 2022-07-09 DIAGNOSIS — K766 Portal hypertension: Secondary | ICD-10-CM | POA: Diagnosis present

## 2022-07-09 DIAGNOSIS — E876 Hypokalemia: Secondary | ICD-10-CM

## 2022-07-09 DIAGNOSIS — E871 Hypo-osmolality and hyponatremia: Secondary | ICD-10-CM | POA: Diagnosis present

## 2022-07-09 DIAGNOSIS — R188 Other ascites: Secondary | ICD-10-CM | POA: Insufficient documentation

## 2022-07-09 DIAGNOSIS — Z880 Allergy status to penicillin: Secondary | ICD-10-CM

## 2022-07-09 DIAGNOSIS — D638 Anemia in other chronic diseases classified elsewhere: Secondary | ICD-10-CM | POA: Diagnosis present

## 2022-07-09 DIAGNOSIS — R1033 Periumbilical pain: Secondary | ICD-10-CM

## 2022-07-09 DIAGNOSIS — R109 Unspecified abdominal pain: Secondary | ICD-10-CM | POA: Diagnosis present

## 2022-07-09 DIAGNOSIS — Z8619 Personal history of other infectious and parasitic diseases: Secondary | ICD-10-CM

## 2022-07-09 DIAGNOSIS — D689 Coagulation defect, unspecified: Secondary | ICD-10-CM | POA: Diagnosis present

## 2022-07-09 DIAGNOSIS — R1011 Right upper quadrant pain: Secondary | ICD-10-CM

## 2022-07-09 DIAGNOSIS — K7031 Alcoholic cirrhosis of liver with ascites: Principal | ICD-10-CM | POA: Diagnosis present

## 2022-07-09 DIAGNOSIS — Z20822 Contact with and (suspected) exposure to covid-19: Secondary | ICD-10-CM | POA: Diagnosis present

## 2022-07-09 DIAGNOSIS — Z5902 Unsheltered homelessness: Secondary | ICD-10-CM

## 2022-07-09 DIAGNOSIS — R011 Cardiac murmur, unspecified: Secondary | ICD-10-CM | POA: Diagnosis present

## 2022-07-09 DIAGNOSIS — M7041 Prepatellar bursitis, right knee: Secondary | ICD-10-CM | POA: Diagnosis present

## 2022-07-09 DIAGNOSIS — D649 Anemia, unspecified: Secondary | ICD-10-CM | POA: Diagnosis present

## 2022-07-09 DIAGNOSIS — Z79899 Other long term (current) drug therapy: Secondary | ICD-10-CM

## 2022-07-09 DIAGNOSIS — R112 Nausea with vomiting, unspecified: Secondary | ICD-10-CM

## 2022-07-09 DIAGNOSIS — D696 Thrombocytopenia, unspecified: Secondary | ICD-10-CM | POA: Diagnosis present

## 2022-07-09 DIAGNOSIS — F1510 Other stimulant abuse, uncomplicated: Secondary | ICD-10-CM | POA: Diagnosis present

## 2022-07-09 DIAGNOSIS — B9562 Methicillin resistant Staphylococcus aureus infection as the cause of diseases classified elsewhere: Secondary | ICD-10-CM | POA: Diagnosis present

## 2022-07-09 DIAGNOSIS — R180 Malignant ascites: Secondary | ICD-10-CM

## 2022-07-09 DIAGNOSIS — T502X5A Adverse effect of carbonic-anhydrase inhibitors, benzothiadiazides and other diuretics, initial encounter: Secondary | ICD-10-CM | POA: Diagnosis present

## 2022-07-09 DIAGNOSIS — F17210 Nicotine dependence, cigarettes, uncomplicated: Secondary | ICD-10-CM | POA: Diagnosis present

## 2022-07-09 DIAGNOSIS — A4902 Methicillin resistant Staphylococcus aureus infection, unspecified site: Secondary | ICD-10-CM

## 2022-07-09 DIAGNOSIS — F1910 Other psychoactive substance abuse, uncomplicated: Secondary | ICD-10-CM | POA: Diagnosis present

## 2022-07-09 DIAGNOSIS — F1210 Cannabis abuse, uncomplicated: Secondary | ICD-10-CM | POA: Diagnosis present

## 2022-07-09 DIAGNOSIS — Z8614 Personal history of Methicillin resistant Staphylococcus aureus infection: Secondary | ICD-10-CM

## 2022-07-09 LAB — BODY FLUID CELL COUNT WITH DIFFERENTIAL
Eos, Fluid: 0 %
Lymphs, Fluid: 6 %
Monocyte-Macrophage-Serous Fluid: 20 %
Neutrophil Count, Fluid: 74 %
Total Nucleated Cell Count, Fluid: 1644 cu mm

## 2022-07-09 LAB — COMPREHENSIVE METABOLIC PANEL
ALT: 30 U/L (ref 0–44)
AST: 47 U/L — ABNORMAL HIGH (ref 15–41)
Albumin: 2.2 g/dL — ABNORMAL LOW (ref 3.5–5.0)
Alkaline Phosphatase: 105 U/L (ref 38–126)
Anion gap: 7 (ref 5–15)
BUN: 10 mg/dL (ref 6–20)
CO2: 24 mmol/L (ref 22–32)
Calcium: 7.3 mg/dL — ABNORMAL LOW (ref 8.9–10.3)
Chloride: 97 mmol/L — ABNORMAL LOW (ref 98–111)
Creatinine, Ser: 0.67 mg/dL (ref 0.61–1.24)
GFR, Estimated: >60 mL/min (ref >60)
Glucose, Bld: 173 mg/dL — ABNORMAL HIGH (ref 70–99)
Potassium: 2.9 mmol/L — ABNORMAL LOW (ref 3.5–5.1)
Sodium: 128 mmol/L — ABNORMAL LOW (ref 135–145)
Total Bilirubin: 5.2 mg/dL — ABNORMAL HIGH (ref 0.3–1.2)
Total Protein: 5.6 g/dL — ABNORMAL LOW (ref 6.5–8.1)

## 2022-07-09 LAB — PROTIME-INR
INR: 1.7 — ABNORMAL HIGH (ref 0.8–1.2)
Prothrombin Time: 19.5 seconds — ABNORMAL HIGH (ref 11.4–15.2)

## 2022-07-09 LAB — GLUCOSE, PLEURAL OR PERITONEAL FLUID: Glucose, Fluid: 139 mg/dL

## 2022-07-09 LAB — MAGNESIUM: Magnesium: 1.6 mg/dL — ABNORMAL LOW (ref 1.7–2.4)

## 2022-07-09 LAB — URINALYSIS, ROUTINE W REFLEX MICROSCOPIC
Glucose, UA: NEGATIVE mg/dL
Hgb urine dipstick: NEGATIVE
Ketones, ur: NEGATIVE mg/dL
Leukocytes,Ua: NEGATIVE
Nitrite: NEGATIVE
Protein, ur: NEGATIVE mg/dL
Specific Gravity, Urine: 1.023 (ref 1.005–1.030)
pH: 5 (ref 5.0–8.0)

## 2022-07-09 LAB — CBC WITH DIFFERENTIAL/PLATELET
Abs Immature Granulocytes: 0.06 10*3/uL (ref 0.00–0.07)
Basophils Absolute: 0 10*3/uL (ref 0.0–0.1)
Basophils Relative: 0 %
Eosinophils Absolute: 0.1 10*3/uL (ref 0.0–0.5)
Eosinophils Relative: 1 %
HCT: 31.4 % — ABNORMAL LOW (ref 39.0–52.0)
Hemoglobin: 10.4 g/dL — ABNORMAL LOW (ref 13.0–17.0)
Immature Granulocytes: 1 %
Lymphocytes Relative: 6 %
Lymphs Abs: 0.8 10*3/uL (ref 0.7–4.0)
MCH: 32.1 pg (ref 26.0–34.0)
MCHC: 33.1 g/dL (ref 30.0–36.0)
MCV: 96.9 fL (ref 80.0–100.0)
Monocytes Absolute: 0.5 10*3/uL (ref 0.1–1.0)
Monocytes Relative: 4 %
Neutro Abs: 11.1 10*3/uL — ABNORMAL HIGH (ref 1.7–7.7)
Neutrophils Relative %: 88 %
Platelets: 123 10*3/uL — ABNORMAL LOW (ref 150–400)
RBC: 3.24 MIL/uL — ABNORMAL LOW (ref 4.22–5.81)
RDW: 18.2 % — ABNORMAL HIGH (ref 11.5–15.5)
WBC: 12.6 10*3/uL — ABNORMAL HIGH (ref 4.0–10.5)
nRBC: 0 % (ref 0.0–0.2)

## 2022-07-09 LAB — PROCALCITONIN: Procalcitonin: 0.1 ng/mL

## 2022-07-09 LAB — ALBUMIN, PLEURAL OR PERITONEAL FLUID: Albumin, Fluid: <1.5 g/dL

## 2022-07-09 LAB — AMMONIA: Ammonia: 27 umol/L (ref 9–35)

## 2022-07-09 LAB — SARS CORONAVIRUS 2 BY RT PCR: SARS Coronavirus 2 by RT PCR: NEGATIVE

## 2022-07-09 LAB — LIPASE, BLOOD: Lipase: 37 U/L (ref 11–51)

## 2022-07-09 LAB — LACTIC ACID, PLASMA: Lactic Acid, Venous: 1.6 mmol/L (ref 0.5–1.9)

## 2022-07-09 LAB — TROPONIN I (HIGH SENSITIVITY)
Troponin I (High Sensitivity): 4 ng/L (ref <18)
Troponin I (High Sensitivity): 5 ng/L (ref <18)

## 2022-07-09 LAB — PROTEIN, PLEURAL OR PERITONEAL FLUID: Total protein, fluid: <3 g/dL

## 2022-07-09 MED ORDER — POTASSIUM CHLORIDE IN NACL 20-0.9 MEQ/L-% IV SOLN
Freq: Once | INTRAVENOUS | Status: AC
  Filled 2022-07-09: qty 1000

## 2022-07-09 MED ORDER — LORAZEPAM 2 MG/ML IJ SOLN: 0.0000 mg | Freq: Four times a day (QID) | INTRAMUSCULAR | Status: AC

## 2022-07-09 MED ORDER — POTASSIUM CHLORIDE 10 MEQ/100ML IV SOLN: 10.0000 meq | Freq: Once | INTRAVENOUS | Status: DC

## 2022-07-09 MED ORDER — ONDANSETRON HCL 4 MG/2ML IJ SOLN: 4.0000 mg | Freq: Four times a day (QID) | INTRAMUSCULAR | Status: DC | PRN

## 2022-07-09 MED ORDER — POTASSIUM CHLORIDE CRYS ER 20 MEQ PO TBCR
40.0000 meq | EXTENDED_RELEASE_TABLET | Freq: Two times a day (BID) | ORAL | Status: AC
  Administered 2022-07-09 (×2): 40 meq via ORAL
  Filled 2022-07-09 (×2): qty 2

## 2022-07-09 MED ORDER — THIAMINE HCL 100 MG PO TABS: 100.0000 mg | ORAL_TABLET | Freq: Every day | ORAL | Status: DC

## 2022-07-09 MED ORDER — THIAMINE HCL 100 MG PO TABS
100.0000 mg | ORAL_TABLET | Freq: Every day | ORAL | Status: DC
  Administered 2022-07-09 – 2022-07-16 (×8): 100 mg via ORAL
  Filled 2022-07-09 (×16): qty 1

## 2022-07-09 MED ORDER — SODIUM CHLORIDE 0.9 % IV SOLN: 250.0000 mL | INTRAVENOUS | Status: DC | PRN

## 2022-07-09 MED ORDER — SODIUM CHLORIDE 0.9 % IV SOLN
2.0000 g | Freq: Once | INTRAVENOUS | Status: AC
  Administered 2022-07-09: 2 g via INTRAVENOUS
  Filled 2022-07-09: qty 12.5

## 2022-07-09 MED ORDER — SODIUM CHLORIDE 0.9 % IV BOLUS: 1000.0000 mL | Freq: Once | INTRAVENOUS | Status: DC

## 2022-07-09 MED ORDER — SODIUM CHLORIDE 0.9% FLUSH
3.0000 mL | Freq: Two times a day (BID) | INTRAVENOUS | Status: DC
  Administered 2022-07-09 – 2022-07-16 (×15): 3 mL via INTRAVENOUS

## 2022-07-09 MED ORDER — SODIUM CHLORIDE 0.9% FLUSH
3.0000 mL | INTRAVENOUS | Status: DC | PRN
  Administered 2022-07-13: 3 mL via INTRAVENOUS

## 2022-07-09 MED ORDER — ADULT MULTIVITAMIN W/MINERALS CH
1.0000 | ORAL_TABLET | Freq: Every day | ORAL | Status: DC
  Administered 2022-07-09 – 2022-07-16 (×8): 1 via ORAL
  Filled 2022-07-09 (×8): qty 1

## 2022-07-09 MED ORDER — FOLIC ACID 1 MG PO TABS
1.0000 mg | ORAL_TABLET | Freq: Every day | ORAL | Status: DC
  Administered 2022-07-09 – 2022-07-16 (×8): 1 mg via ORAL
  Filled 2022-07-09 (×8): qty 1

## 2022-07-09 MED ORDER — FERROUS SULFATE 325 (65 FE) MG PO TABS
325.0000 mg | ORAL_TABLET | Freq: Every day | ORAL | Status: DC
  Administered 2022-07-10 – 2022-07-16 (×7): 325 mg via ORAL
  Filled 2022-07-09 (×7): qty 1

## 2022-07-09 MED ORDER — FUROSEMIDE 40 MG PO TABS
40.0000 mg | ORAL_TABLET | Freq: Two times a day (BID) | ORAL | Status: DC
  Administered 2022-07-09 – 2022-07-16 (×15): 40 mg via ORAL
  Filled 2022-07-09 (×15): qty 1

## 2022-07-09 MED ORDER — VANCOMYCIN HCL 1500 MG/300ML IV SOLN
1500.0000 mg | Freq: Once | INTRAVENOUS | Status: AC
  Administered 2022-07-09: 1500 mg via INTRAVENOUS
  Filled 2022-07-09: qty 300

## 2022-07-09 MED ORDER — VANCOMYCIN HCL IN DEXTROSE 1-5 GM/200ML-% IV SOLN
1000.0000 mg | Freq: Once | INTRAVENOUS | Status: AC
  Administered 2022-07-09: 1000 mg via INTRAVENOUS
  Filled 2022-07-09: qty 200

## 2022-07-09 MED ORDER — ENSURE MAX PROTEIN PO LIQD
11.0000 [oz_av] | Freq: Two times a day (BID) | ORAL | Status: DC
  Administered 2022-07-09 – 2022-07-15 (×9): 11 [oz_av] via ORAL
  Administered 2022-07-15: 237 mL via ORAL
  Administered 2022-07-16: 11 [oz_av] via ORAL

## 2022-07-09 MED ORDER — SODIUM CHLORIDE 0.9 % IV SOLN
2.0000 g | INTRAVENOUS | Status: DC
  Administered 2022-07-09 – 2022-07-14 (×6): 2 g via INTRAVENOUS
  Filled 2022-07-09 (×2): qty 20
  Filled 2022-07-09: qty 2
  Filled 2022-07-09 (×2): qty 20
  Filled 2022-07-09: qty 2

## 2022-07-09 MED ORDER — ONDANSETRON HCL 4 MG/2ML IJ SOLN
4.0000 mg | Freq: Once | INTRAMUSCULAR | Status: AC
  Administered 2022-07-09: 4 mg via INTRAVENOUS
  Filled 2022-07-09: qty 2

## 2022-07-09 MED ORDER — ENOXAPARIN SODIUM 60 MG/0.6ML IJ SOSY
60.0000 mg | PREFILLED_SYRINGE | INTRAMUSCULAR | Status: DC
  Administered 2022-07-09 – 2022-07-10 (×2): 60 mg via SUBCUTANEOUS
  Filled 2022-07-09 (×7): qty 0.6

## 2022-07-09 MED ORDER — SPIRONOLACTONE 25 MG PO TABS
100.0000 mg | ORAL_TABLET | Freq: Every day | ORAL | Status: DC
  Administered 2022-07-09 – 2022-07-16 (×8): 100 mg via ORAL
  Filled 2022-07-09 (×8): qty 4

## 2022-07-09 MED ORDER — MAGNESIUM SULFATE 2 GM/50ML IV SOLN
2.0000 g | Freq: Once | INTRAVENOUS | Status: AC
  Administered 2022-07-09: 2 g via INTRAVENOUS
  Filled 2022-07-09: qty 50

## 2022-07-09 MED ORDER — ONDANSETRON HCL 4 MG PO TABS: 4.0000 mg | ORAL_TABLET | Freq: Four times a day (QID) | ORAL | Status: DC | PRN

## 2022-07-09 MED ORDER — METRONIDAZOLE 500 MG/100ML IV SOLN
500.0000 mg | Freq: Once | INTRAVENOUS | Status: AC
  Administered 2022-07-09: 500 mg via INTRAVENOUS

## 2022-07-09 NOTE — Assessment & Plan Note (Signed)
-   Supplement magnesium 

## 2022-07-09 NOTE — Assessment & Plan Note (Signed)
Most likely hypervolemic hyponatremia Expect improvement in sodium levels with diuresis and fluid restriction

## 2022-07-09 NOTE — Assessment & Plan Note (Signed)
Patient with a history of alcoholic liver cirrhosis and ascites who presents for evaluation of abdominal pain and noted to have a low-grade temp with a Tmax of 99.2 Recently hospitalized for E. coli sepsis Concern for possible SBP Place patient empirically on Rocephin 2 g IV daily IR consult for paracentesis

## 2022-07-09 NOTE — ED Triage Notes (Signed)
Pt c/o abdominal distention, abdominal pain and increased weakness x2 days. Pt with hx/o polysubstance abuse, alcoholic cirrhosis, Hep C. Pt was seen on 9/11 for same and still sts he needs a paracentesis. Pt also with recent admission for sepsis/E.Coli bacteremia. Pt A&O x4.

## 2022-07-09 NOTE — Assessment & Plan Note (Signed)
Related to chronic H&H is stable

## 2022-07-09 NOTE — Assessment & Plan Note (Signed)
Most likely secondary to diuretic use Supplement potassium 

## 2022-07-09 NOTE — Assessment & Plan Note (Signed)
Patient has been counseled on the need to abstain from illicit drug use 

## 2022-07-09 NOTE — Progress Notes (Signed)
PHARMACY -  BRIEF ANTIBIOTIC NOTE   Pharmacy has received consult(s) for Vancomycin, Cefepime from an ED provider.  The patient's profile has been reviewed for ht/wt/allergies/indication/available labs.    One time order(s) placed for Vancomycin 2500 mg IV X 1 and Cefepime 2 gm IV X 1.   Further antibiotics/pharmacy consults should be ordered by admitting physician if indicated.                       Thank you, Aislinn Feliz D 07/09/2022  3:15 AM

## 2022-07-09 NOTE — Assessment & Plan Note (Addendum)
Patient with a history of alcoholic liver cirrhosis with ascites and recurrent paracentesis. Has evidence of portal hypertension which includes thrombocytopenia and coagulopathy Monitor patient closely for signs and symptoms of alcohol withdrawal and place on CIWA protocol.  Administer lorazepam for CIWA score of 8 or greater

## 2022-07-09 NOTE — Procedures (Signed)
Interventional Radiology Procedure:   Indications: Cirrhosis and ascites.  Procedure: US guided paracentesis  Findings: Removed 1700 ml from right lower quadrant  Complications: None     EBL: Less than 10 ml  Procedure was performed by Pattricia Boss, PA-C.

## 2022-07-09 NOTE — ED Provider Notes (Signed)
[facility] Provider Note    Event Date/Time   First MD Initiated Contact with Patient 07/09/22 [phone]     (approximate)   History   Bloated   HPI  Marco Spears is a 46 y.o. male [place] with increasing abdominal distention, pain, increased weakness and now nausea/vomiting.  Patient with a history of polysubstance abuse who states he last used methamphetamine on Monday, alcoholic cirrhosis, hepatitis C.  Seen on 07/06/2022 in the ED for same with unremarkable work-up and discharged.  Patient with recent hospitalization for sepsis/E. coli bacteremia.  Denies fever but complains of chills.  Denies chest pain, shortness of breath, diarrhea.     Past Medical History   Past Medical History:  Diagnosis Date   Cirrhosis of liver (HCC)    Heart murmur    Polysubstance abuse (HCC)    Septic bursitis      Active Problem List   Patient Active Problem List   Diagnosis Date Noted   Malnutrition of moderate degree 06/11/2022   Portal hypertension (HCC) 06/02/2022   Bacteremia 06/02/2022   AMS (altered mental status) 06/01/2022   Thrombocytopenia (HCC) 06/01/2022   Septic bursitis 05/22/2022   Septic arthritis (HCC) 05/21/2022   Alcohol abuse 05/21/2022   On anticoagulant therapy 05/21/2022   Hypocalcemia 05/21/2022   Tongue lesion 05/21/2022   Cellulitis of right leg    Cirrhosis of liver without ascites (HCC)    Polysubstance abuse (HCC)    Cellulitis 05/05/2022   Sepsis (HCC) 05/05/2022   Alcoholic cirrhosis of liver with ascites (HCC) 04/21/2022   Hypokalemia 04/21/2022   Anemia 04/21/2022     Past Surgical History   Past Surgical History:  Procedure Laterality Date   INCISION AND DRAINAGE Right 05/07/2022   Procedure: INCISION AND DRAINAGE;  Surgeon: Juanell Fairly, MD;  Location: [facility] ORS;  Service: Orthopedics;  Laterality: Right;   IRRIGATION AND DEBRIDEMENT KNEE Right 05/21/2022   Procedure: IRRIGATION AND  DEBRIDEMENT KNEE;  Surgeon: Juanell Fairly, MD;  Location: [facility] ORS;  Service: Orthopedics;  Laterality: Right;     Home Medications   Prior to Admission medications   Medication Sig Start Date End Date Taking? Authorizing Provider  B Complex-C (B-COMPLEX WITH VITAMIN C) tablet Take 1 tablet by mouth daily.    [provider]  cefadroxil (DURICEF) 500 MG capsule Take 2 capsules (1,000 mg) by mouth 2 (two) times daily for 7 days then starting on 9/16 take 1 capsule (500mg ) twice daily 07/04/22   09/03/22, MD  ferrous sulfate 325 (65 FE) MG tablet Take 1 tablet (325 mg total) by mouth daily with breakfast. 05/22/22   05/24/22, MD  folic acid (FOLVITE) 1 MG tablet Take 1 tablet (1 mg total) by mouth daily. 05/22/22   05/24/22, MD  furosemide (LASIX) 40 MG tablet Take 1 tablet (40 mg total) by mouth 2 (two) times daily. 07/03/22   09/02/22, MD  Multiple Vitamin (MULTIVITAMIN WITH MINERALS) TABS tablet Take 1 tablet by mouth daily.    [provider]  spironolactone (ALDACTONE) 50 MG tablet Take 2 tablets (100 mg total) by mouth daily. 07/03/22   09/02/22, MD  thiamine (VITAMIN B1) 100 MG tablet Take 1 tablet (100 mg total) by mouth daily. 06/16/22 07/16/22  07/18/22, MD     Allergies  Amoxicillin   Family History  History reviewed. No pertinent family history.   Physical Exam  Triage Vital  Signs: ED Triage Vitals [07/09/22 0126]  Enc Vitals Group     BP 128/88     Pulse Rate (!) 106     Resp 19     Temp 99.1 F (37.3 C)     Temp Source Oral     SpO2 97 %     Weight      Height      Head Circumference      Peak Flow      Pain Score      Pain Loc      Pain Edu?      Excl. in GC?     Updated Vital Signs: BP 123/66   Pulse 91   Temp 99.1 F (37.3 C) (Oral)   Resp 20   SpO2 96%    General: Awake, mild distress.  CV:  Mildly tachycardic.  Good peripheral perfusion.  Resp:  Normal effort.  CTA B. Abd:  Mild tenderness to  right upper quadrant without rebound or guarding.  Hepatomegaly.  Mild to moderate distention.  Other:  No truncal vesicles.   ED Results / Procedures / Treatments  Labs (all labs ordered are listed, but only abnormal results are displayed) Labs Reviewed  COMPREHENSIVE METABOLIC PANEL - Abnormal; Notable for the following components:      Result Value   Sodium 128 (*)    Potassium 2.9 (*)    Chloride 97 (*)    Glucose, Bld 173 (*)    Calcium 7.3 (*)    Total Protein 5.6 (*)    Albumin 2.2 (*)    AST 47 (*)    Total Bilirubin 5.2 (*)    All other components within normal limits  URINALYSIS, ROUTINE W REFLEX MICROSCOPIC - Abnormal; Notable for the following components:   Color, Urine AMBER (*)    APPearance HAZY (*)    Bilirubin Urine SMALL (*)    All other components within normal limits  MAGNESIUM - Abnormal; Notable for the following components:   Magnesium 1.6 (*)    All other components within normal limits  CBC WITH DIFFERENTIAL/PLATELET - Abnormal; Notable for the following components:   WBC 12.6 (*)    RBC 3.24 (*)    Hemoglobin 10.4 (*)    HCT 31.4 (*)    RDW 18.2 (*)    Platelets 123 (*)    Neutro Abs 11.1 (*)    All other components within normal limits  PROTIME-INR - Abnormal; Notable for the following components:   Prothrombin Time 19.5 (*)    INR 1.7 (*)    All other components within normal limits  SARS CORONAVIRUS 2 BY RT PCR  CULTURE, BLOOD (ROUTINE X 2)  CULTURE, BLOOD (ROUTINE X 2)  URINE CULTURE  LIPASE, BLOOD  LACTIC ACID, PLASMA  AMMONIA  PROCALCITONIN  LACTIC ACID, PLASMA  TROPONIN I (HIGH SENSITIVITY)  TROPONIN I (HIGH SENSITIVITY)     EKG  ED ECG REPORT I, Langston Summerfield J, the attending physician, personally viewed and interpreted this ECG.   Date: 07/09/2022  EKG Time: 0124  Rate: 100  Rhythm: normal sinus rhythm  Axis: Normal  Intervals:none  ST&T Change: Nonspecific    RADIOLOGY I have independently visualized and  interpreted patient's x-ray and ultrasound as well as noted the radiology interpretation:  X-ray: No acute cardiopulmonary process  Ultrasound abdomen: Ascites in all 4 quadrants  Official radiology report(s): [place] ASCITES (ABDOMEN LIMITED)  Result Date: 07/09/2022 CLINICAL DATA:  Cirrhosis; evaluate for ascites EXAM: LIMITED ABDOMEN ULTRASOUND  FOR ASCITES TECHNIQUE: Limited ultrasound survey for ascites was performed in all four abdominal quadrants. COMPARISON:  Ultrasound 07/02/2022 FINDINGS: Moderate abdominopelvic ascites seen in all 4 quadrants. IMPRESSION: Moderate abdominopelvic ascites seen in all 4 quadrants. Electronically Signed   By: Minerva Fester M.D.   On: 07/09/2022 03:55   DG Chest Port 1 View  Result Date: 07/09/2022 CLINICAL DATA:  Fever EXAM: PORTABLE CHEST 1 VIEW COMPARISON:  06/28/2022 FINDINGS: The heart size and mediastinal contours are within normal limits. Both lungs are clear. The visualized skeletal structures are unremarkable. IMPRESSION: No active disease. Electronically Signed   By: Deatra Robinson M.D.   On: 07/09/2022 03:24     PROCEDURES:  Critical Care performed: Yes, see critical care procedure note(s)  CRITICAL CARE Performed by: Irean Hong   Total critical care time: 30 minutes  Critical care time was exclusive of separately billable procedures and treating other patients.  Critical care was necessary to treat or prevent imminent or life-threatening deterioration.  Critical care was time spent personally by me on the following activities: development of treatment plan with patient and/or surrogate as well as nursing, discussions with consultants, evaluation of patient's response to treatment, examination of patient, obtaining history from patient or surrogate, ordering and performing treatments and interventions, ordering and review of laboratory studies, ordering and review of radiographic studies, pulse oximetry and re-evaluation of patient's  condition.   Marland Kitchen1-3 Lead EKG Interpretation  Performed by: Irean Hong, MD Authorized by: Irean Hong, MD     Interpretation: normal     ECG rate:  100   ECG rate assessment: normal     Rhythm: sinus rhythm     Ectopy: none     Conduction: normal   Comments:     Patient placed on cardiac monitor to evaluate for arrhythmias    MEDICATIONS ORDERED IN ED: Medications  ceFEPIme (MAXIPIME) 2 g in sodium chloride 0.9 % 100 mL IVPB (has no administration in time range)  vancomycin (VANCOCIN) IVPB 1000 mg/200 mL premix (has no administration in time range)    Followed by  vancomycin (VANCOREADY) IVPB 1500 mg/300 mL (has no administration in time range)  thiamine (VITAMIN B1) tablet 100 mg (has no administration in time range)  LORazepam (ATIVAN) injection 0-4 mg (has no administration in time range)  magnesium sulfate IVPB 2 g 50 mL (0 g Intravenous Stopped 07/09/22 0409)  ondansetron (ZOFRAN) injection 4 mg (4 mg Intravenous Given 07/09/22 0313)  0.9 % NaCl with KCl 20 mEq/ L  infusion ( Intravenous New Bag/Given 07/09/22 0313)  metroNIDAZOLE (FLAGYL) IVPB 500 mg (0 mg Intravenous Stopped 07/09/22 0443)     IMPRESSION / MDM / ASSESSMENT AND PLAN / ED COURSE  I reviewed the triage vital signs and the nursing notes.                             46 year old male presenting with increased abdominal pain/distention, now nausea and vomiting. Differential diagnosis includes, but is not limited to, biliary disease (biliary colic, acute cholecystitis, cholangitis, choledocholithiasis, etc), intrathoracic causes for epigastric abdominal pain including ACS, gastritis, duodenitis, pancreatitis, small bowel or large bowel obstruction, abdominal aortic aneurysm, hernia, and ulcer(s).  I have personally reviewed patient's records and note his recent hospitalization from 9/3-07/03/2022.  Patient's presentation is most consistent with acute presentation with potential threat to life or bodily  function.  The patient is on the cardiac monitor to evaluate for evidence  of arrhythmia and/or significant heart rate changes.  Recheck patient's oral temperature for patient feeling hot; now 99.8 degrees Fahrenheit.  Patient declines rectal temperature.  Gwyndolyn Kaufman results demonstrate mild leukocytosis WBC 12.6, improved platelets for most recent, more hyponatremic than prior with sodium 128, more hypokalemic with potassium 2.9, improved T. bili from prior.  UA unremarkable.  Will obtain markers for sepsis including blood cultures, lactic acid and procalcitonin.  Obtain COVID swab, coagulation panel, ammonia.  Obtain chest x-ray, ultrasound.  Initiate IV fluids, IV potassium, IV magnesium, IV Zofran for nausea/vomiting.  Review of records demonstrate patient had paracentesis back to back on 9/3, 9/5 and 07/02/2022.  After blood cultures are drawn will initiate IV antibiotics for concern for SBP.  Dissipate hospitalization.  Clinical Course as of 07/09/22 0501  Thu Jul 09, 2022  3662 US demonstrates ascites in all 4 quadrants.  Given electrolyte abnormalities and ultrasound indicative of need for paracentesis, will discuss with hospitalist services for evaluation and admission. [JS]    Clinical Course User Index [JS] Irean Hong, MD     FINAL CLINICAL IMPRESSION(S) / ED DIAGNOSES   Final diagnoses:  Ascites due to alcoholic cirrhosis (HCC)  Right upper quadrant abdominal pain  Nausea and vomiting, unspecified vomiting type  Hyponatremia  Hypokalemia  Hypomagnesemia     Rx / DC Orders   ED Discharge Orders     None        Note:  This document was prepared using Dragon voice recognition software and may include unintentional dictation errors.   Irean Hong, MD 07/09/22 [phone]

## 2022-07-09 NOTE — H&P (Signed)
History and Physical    Patient: Marco Spears JJK:[id number] DOB: 1976-06-18 DOA: 07/09/2022 DOS: the patient was seen and examined on 07/09/2022 PCP: Pcp, No  Patient coming from: Home  Chief Complaint:  Chief Complaint  Patient presents with   Bloated   HPI: Marco Spears is a 46 y.o. male with medical history significant for polysubstance abuse, alcoholic liver disease with cirrhosis, hepatitis C status posttreatment, DVT treated with Eliquis, status post hospitalization from 08/07 - 08/21 for septic arthritis of right knee and ESBL E. coli bacteremia, rehospitalized again from 09/03 - 09/08 for E. coli sepsis and decompensated liver disease.  During his last hospitalization he had paracentesis with drainage of 2 L of fluid.  During his last hospitalization he had E. coli bacteremia and was treated with antibiotics He presents to the ER for evaluation of abdominal distention and pain as well as weakness.  Has had chills but denies having any fever.  He denies having any chest pain, no shortness of breath, no changes in his bowel habits, no urinary frequency, no nocturia, no dysuria, no blurred vision no focal deficit. Labs show sodium of 128, magnesium of 1.6 and potassium of 2.9 He also has a white count of 12.6 He received empiric antibiotic therapy with cefepime, vancomycin and Flagyl for presumed SBP    Review of Systems: As mentioned in the history of present illness. All other systems reviewed and are negative. Past Medical History:  Diagnosis Date   Cirrhosis of liver (HCC)    Heart murmur    Polysubstance abuse (HCC)    Septic bursitis    Past Surgical History:  Procedure Laterality Date   INCISION AND DRAINAGE Right 05/07/2022   Procedure: INCISION AND DRAINAGE;  Surgeon: Juanell Fairly, MD;  Location: [facility] ORS;  Service: Orthopedics;  Laterality: Right;   IRRIGATION AND DEBRIDEMENT KNEE Right 05/21/2022   Procedure: IRRIGATION AND DEBRIDEMENT KNEE;  Surgeon:  Juanell Fairly, MD;  Location: [facility] ORS;  Service: Orthopedics;  Laterality: Right;   Social History:  reports that he has been smoking cigarettes. He has never used smokeless tobacco. He reports current alcohol use. He reports current drug use. Drugs: Marijuana, Methamphetamines, IV, and Cocaine.  Allergies  Allergen Reactions   Amoxicillin Rash    TOLERATED CEFAZOLIN PRIOR States it makes his skin turn red    History reviewed. No pertinent family history.  Prior to Admission medications   Medication Sig Start Date End Date Taking? Authorizing Provider  B Complex-C (B-COMPLEX WITH VITAMIN C) tablet Take 1 tablet by mouth daily.   Yes [provider]  cefadroxil (DURICEF) 500 MG capsule Take 2 capsules (1,000 mg) by mouth 2 (two) times daily for 7 days then starting on 9/16 take 1 capsule (500mg ) twice daily 07/04/22  Yes 09/03/22, MD  ferrous sulfate 325 (65 FE) MG tablet Take 1 tablet (325 mg total) by mouth daily with breakfast. 05/22/22  Yes Sreenath, Sudheer B, MD  folic acid (FOLVITE) 1 MG tablet Take 1 tablet (1 mg total) by mouth daily. 05/22/22  Yes Sreenath, Sudheer B, MD  furosemide (LASIX) 40 MG tablet Take 1 tablet (40 mg total) by mouth 2 (two) times daily. 07/03/22  Yes 09/02/22, MD  Multiple Vitamin (MULTIVITAMIN WITH MINERALS) TABS tablet Take 1 tablet by mouth daily.   Yes [provider]  spironolactone (ALDACTONE) 50 MG tablet Take 2 tablets (100 mg total) by mouth daily. 07/03/22  Yes 09/02/22, MD  thiamine (VITAMIN B1) 100  MG tablet Take 1 tablet (100 mg total) by mouth daily. 06/16/22 07/16/22 Yes Lynn Ito, MD    Physical Exam: Vitals:   07/09/22 0730 07/09/22 0731 07/09/22 0900 07/09/22 0915  BP: 110/72  104/75   Pulse: 91  88   Resp:  17  19  Temp:      TempSrc:      SpO2: 96%  96%    Physical Exam Vitals and nursing note reviewed.  Constitutional:      Comments: Sleeping but arouses easily  HENT:     Head: Normocephalic and  atraumatic.     Nose: Nose normal.     Mouth/Throat:     Mouth: Mucous membranes are moist.  Eyes:     Comments: Pale conjunctiva  Cardiovascular:     Rate and Rhythm: Normal rate and regular rhythm.  Pulmonary:     Effort: Pulmonary effort is normal.     Breath sounds: Normal breath sounds.  Abdominal:     General: There is distension.     Comments: Tender in the periumbilical and suprapubic area  Musculoskeletal:     Right lower leg: Edema present.     Left lower leg: Edema present.  Skin:    General: Skin is warm and dry.  Neurological:     Comments: Able to move all extremities  Psychiatric:     Comments: Unable to assess     Data Reviewed: Relevant notes from primary care and specialist visits, past discharge summaries as available in EHR, including Care Everywhere. Prior diagnostic testing as pertinent to current admission diagnoses Updated medications and problem lists for reconciliation ED course, including vitals, labs, imaging, treatment and response to treatment Triage notes, nursing and pharmacy notes and ED provider's notes Notable results as noted in HPI Labs reviewed.  Troponin 5, ammonia 27, lactic acid 1.6, procalcitonin 0.10, PT 19.5, INR 1.7, lipase 37, sodium 128, potassium 2.9, chloride 97, bicarb 24, glucose 173, BUN 10, creatinine 0.67, calcium 7.3, total protein 5.6, albumin 2.2, AST 47, ALT 30, alkaline phosphatase 105, total bilirubin 5.2, magnesium 1.6, white count 12.6, hemoglobin 10.4, hematocrit 31.4, platelet count 123 Chest x-ray reviewed by me shows no evidence of acute cardiopulmonary disease Abdominal ultrasound reviewed by me shows Moderate abdominopelvic ascites seen in all 4 quadrants. Twelve-lead EKG reviewed by me shows sinus tachycardia with low voltage QRS There are no new results to review at this time.  Assessment and Plan: * Abdominal pain Patient with a history of alcoholic liver cirrhosis and ascites who presents for evaluation  of abdominal pain and noted to have a low-grade temp with a Tmax of 99.2 Recently hospitalized for E. coli sepsis Concern for possible SBP Place patient empirically on Rocephin 2 g IV daily IR consult for paracentesis  Alcoholic cirrhosis of liver with ascites (HCC) Patient with a history of alcoholic liver cirrhosis with ascites and recurrent paracentesis. Has evidence of portal hypertension which includes thrombocytopenia and coagulopathy Monitor patient closely for signs and symptoms of alcohol withdrawal and place on CIWA protocol.  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  All questions and concerns have been addressed.  He verbalizes understanding and agrees with the plan.  Severity of Illness: The appropriate patient status for this patient is OBSERVATION. Observation status is judged to be reasonable and necessary in order to provide the required intensity of service to ensure the patient's safety. The patient's presenting symptoms, physical exam findings, and initial radiographic and laboratory data in the context of their medical condition is felt to place them at decreased risk for further clinical deterioration. Furthermore, it is anticipated that the patient will be medically stable for discharge from the hospital within 2 midnights of admission.   Author: Lucile Shutters, MD 07/09/2022 10:03 AM  For on call review [email or web address].

## 2022-07-10 DIAGNOSIS — Z20822 Contact with and (suspected) exposure to covid-19: Secondary | ICD-10-CM | POA: Diagnosis present

## 2022-07-10 DIAGNOSIS — F1910 Other psychoactive substance abuse, uncomplicated: Secondary | ICD-10-CM | POA: Diagnosis not present

## 2022-07-10 DIAGNOSIS — D638 Anemia in other chronic diseases classified elsewhere: Secondary | ICD-10-CM | POA: Diagnosis present

## 2022-07-10 DIAGNOSIS — Z8619 Personal history of other infectious and parasitic diseases: Secondary | ICD-10-CM | POA: Diagnosis not present

## 2022-07-10 DIAGNOSIS — Z79899 Other long term (current) drug therapy: Secondary | ICD-10-CM | POA: Diagnosis not present

## 2022-07-10 DIAGNOSIS — D689 Coagulation defect, unspecified: Secondary | ICD-10-CM | POA: Diagnosis present

## 2022-07-10 DIAGNOSIS — R188 Other ascites: Secondary | ICD-10-CM | POA: Diagnosis not present

## 2022-07-10 DIAGNOSIS — K7290 Hepatic failure, unspecified without coma: Secondary | ICD-10-CM | POA: Diagnosis not present

## 2022-07-10 DIAGNOSIS — Z5902 Unsheltered homelessness: Secondary | ICD-10-CM | POA: Diagnosis not present

## 2022-07-10 DIAGNOSIS — R011 Cardiac murmur, unspecified: Secondary | ICD-10-CM | POA: Diagnosis present

## 2022-07-10 DIAGNOSIS — Z8614 Personal history of Methicillin resistant Staphylococcus aureus infection: Secondary | ICD-10-CM | POA: Diagnosis not present

## 2022-07-10 DIAGNOSIS — K7460 Unspecified cirrhosis of liver: Secondary | ICD-10-CM

## 2022-07-10 DIAGNOSIS — F17210 Nicotine dependence, cigarettes, uncomplicated: Secondary | ICD-10-CM | POA: Diagnosis present

## 2022-07-10 DIAGNOSIS — F1510 Other stimulant abuse, uncomplicated: Secondary | ICD-10-CM | POA: Diagnosis present

## 2022-07-10 DIAGNOSIS — T502X5A Adverse effect of carbonic-anhydrase inhibitors, benzothiadiazides and other diuretics, initial encounter: Secondary | ICD-10-CM | POA: Diagnosis present

## 2022-07-10 DIAGNOSIS — F1210 Cannabis abuse, uncomplicated: Secondary | ICD-10-CM | POA: Diagnosis present

## 2022-07-10 DIAGNOSIS — K652 Spontaneous bacterial peritonitis: Secondary | ICD-10-CM

## 2022-07-10 DIAGNOSIS — E876 Hypokalemia: Secondary | ICD-10-CM | POA: Diagnosis present

## 2022-07-10 DIAGNOSIS — E871 Hypo-osmolality and hyponatremia: Secondary | ICD-10-CM | POA: Diagnosis present

## 2022-07-10 DIAGNOSIS — A4902 Methicillin resistant Staphylococcus aureus infection, unspecified site: Secondary | ICD-10-CM | POA: Diagnosis not present

## 2022-07-10 DIAGNOSIS — D649 Anemia, unspecified: Secondary | ICD-10-CM | POA: Diagnosis not present

## 2022-07-10 DIAGNOSIS — Z880 Allergy status to penicillin: Secondary | ICD-10-CM | POA: Diagnosis not present

## 2022-07-10 DIAGNOSIS — K766 Portal hypertension: Secondary | ICD-10-CM | POA: Diagnosis present

## 2022-07-10 DIAGNOSIS — B9562 Methicillin resistant Staphylococcus aureus infection as the cause of diseases classified elsewhere: Secondary | ICD-10-CM | POA: Diagnosis present

## 2022-07-10 DIAGNOSIS — K7031 Alcoholic cirrhosis of liver with ascites: Secondary | ICD-10-CM | POA: Diagnosis present

## 2022-07-10 DIAGNOSIS — R140 Abdominal distension (gaseous): Secondary | ICD-10-CM | POA: Diagnosis present

## 2022-07-10 DIAGNOSIS — D696 Thrombocytopenia, unspecified: Secondary | ICD-10-CM | POA: Diagnosis present

## 2022-07-10 DIAGNOSIS — M7041 Prepatellar bursitis, right knee: Secondary | ICD-10-CM | POA: Diagnosis present

## 2022-07-10 LAB — CYTOLOGY - NON PAP

## 2022-07-10 LAB — CBC
HCT: 30.7 % — ABNORMAL LOW (ref 39.0–52.0)
Hemoglobin: 10.2 g/dL — ABNORMAL LOW (ref 13.0–17.0)
MCH: 32.1 pg (ref 26.0–34.0)
MCHC: 33.2 g/dL (ref 30.0–36.0)
MCV: 96.5 fL (ref 80.0–100.0)
Platelets: 116 10*3/uL — ABNORMAL LOW (ref 150–400)
RBC: 3.18 MIL/uL — ABNORMAL LOW (ref 4.22–5.81)
RDW: 17.8 % — ABNORMAL HIGH (ref 11.5–15.5)
WBC: 6.3 10*3/uL (ref 4.0–10.5)
nRBC: 0 % (ref 0.0–0.2)

## 2022-07-10 LAB — BASIC METABOLIC PANEL
Anion gap: 4 — ABNORMAL LOW (ref 5–15)
BUN: 7 mg/dL (ref 6–20)
CO2: 24 mmol/L (ref 22–32)
Calcium: 7.2 mg/dL — ABNORMAL LOW (ref 8.9–10.3)
Chloride: 102 mmol/L (ref 98–111)
Creatinine, Ser: 0.54 mg/dL — ABNORMAL LOW (ref 0.61–1.24)
GFR, Estimated: >60 mL/min (ref >60)
Glucose, Bld: 123 mg/dL — ABNORMAL HIGH (ref 70–99)
Potassium: 3.6 mmol/L (ref 3.5–5.1)
Sodium: 130 mmol/L — ABNORMAL LOW (ref 135–145)

## 2022-07-10 LAB — PROTEIN, BODY FLUID (OTHER): Total Protein, Body Fluid Other: 1.3 g/dL

## 2022-07-10 LAB — URINE CULTURE: Culture: NO GROWTH

## 2022-07-10 MED ORDER — MAGIC MOUTHWASH
10.0000 mL | Freq: Four times a day (QID) | ORAL | Status: DC | PRN
  Administered 2022-07-12 – 2022-07-13 (×3): 10 mL via ORAL
  Filled 2022-07-10 (×5): qty 10

## 2022-07-10 MED ORDER — MAGIC MOUTHWASH
10.0000 mL | Freq: Four times a day (QID) | ORAL | Status: AC
  Administered 2022-07-10 – 2022-07-11 (×5): 10 mL via ORAL
  Filled 2022-07-10 (×11): qty 10

## 2022-07-10 NOTE — Progress Notes (Signed)
Pt requested Ensure for protein supplementation. Ensure ordered by Manuela Schwartz, NP. Pt also complains of mouth pain, states his gums are swollen. Pt had nosebleed during the night when brushing his teeth at 0430. Pt states this happens often.

## 2022-07-10 NOTE — Plan of Care (Signed)

## 2022-07-10 NOTE — Discharge Instructions (Signed)
Food Resources Agency Name: [facility] Agency Address: [address] [place], [place] [address] Phone: [phone] Website: www.alamanceservices.org Service(s) Offered: Housing services, self-sufficiency, congregate meal  program, weatherization program, Field seismologist program, emergency food assistance,  housing counseling, home ownership program, wheels -towork program. Meals free for 60 and older at various  locations from 9am-1pm, Monday-Friday:  [employer], [address] [place], [phone]  [facility], [address] [place] [phone]  [facility], [address] [place] [phone]  The [address], [place], [phone]  Agency Name: [facility] Address: [phone] [address], [place], [place] [address] Phone: [phone] Website: www.[email or web address] Service(s) Offered: Home delivered hot, frozen, and emergency  meals. Grocery assistance program which matches  volunteers one-on-one with seniors unable to grocery shop  for themselves. Must be 60 years and older; less than 20  hours of in-home aide service, limited or no driving ability;  live alone or with someone with a disability; live in  [place].  Agency Name: [facility]) Address: [address], [place], [place] [address] Phone: [phone] Service(s) Offered: Food is served to shut-ins, homeless, elderly, and low  income people in the community every Saturday (11:30  am-12:30 pm) and Sunday (12:30 pm-1:30pm). Volunteers  also offer help and encouragement in seeking employment,  and spiritual guidance. February 18, 2017 8  Agency Name: Department of Social Services Address: [address] [place], [place] [address] Phone: [phone] Service(s) Offered: Child support services; child welfare services; food stamps;  Medicaid; work first  family assistance; and aid with fuel,  rent, food and medicine.  Agency Name: Dietitian Address: [address] [place], [place] Phone: [phone] Website: [email or web address] Services Offered: Monday 10:00am-12:00, 8:00pm-9:00pm, and Friday  10:00am-12:00. Agency Name: [facility] [place] Address: [address], [place], [place] [address] Phone: [phone] Website: www.[email or web address].org Service(s) Offered: Serves weekday meals, open from 11:30 am- 1:00 pm., and  6:30-7:30pm, Monday-Wednesday-Friday distributes food  3:30-6pm, Monday-Wednesday-Friday.  Agency Name: [facility] Address: [address] [place], [place] Phone: [phone] Website: [email or web address].[email or web address] Services Offered: Distributes food the 4th Saturday of the month, starting at  8:00 am Agency Name: [facility] Address: [phone] [address] [place], [place] [address] Phone: [phone] Website: [email or web address] Service(s) Offered: Bread of life, weekly food pantry. Open Wednesdays from  10:00am-noon.  Agency Name: [facility] [employer] [facility] Address: [address] [place], [place], [place] Phone: [phone] Services Offered: Distributes food 9am-1pm, Monday-Thursday. Call for details.  Agency Name: [facility] Address: [address], [place], [place] [address] Phone: [phone] Website: [email or web address].[email or web address] Service(s) Offered: Games developer. Call for assistance. Agency Name: [facility] Address: [address], [place], [place] [address] Phone: [phone] Service Offered: Emergency Food Pantry. Call for appointment.  Agency Name: [facility] Address: [address] [place], [place] [address] Phone: [phone] Website: [email or web address] Services Offered: Games developer. Call for details Agency Name: [facility] Address: [address] [place], [place] Phone: [phone] Website: [email or web address] Service(s) Offered: Emergency Food Pantry. Call for details.  Agency Name: Holiday representative Address: [address], [place], [place] [address] Phone: [phone] or [phone] Website: [email or web address] Service(s) Offered: Distribute food 9am-11:30 am, Tuesday-Friday, and 1- 3:30pm, Monday-Friday. Food pantry Monday-Friday  1pm-3pm, fresh items, Mon.-Wed.-Fri.  Agency Name: [facility] Address: [address] [place], [place] [address] Phone: [phone]  Website: www.safealamance.org Services Offered: Distribute food Tues and Sats from 9:00am-noon. Closed  1st Saturday of each month. Call for details       Rent/Utility/Housing Agency Name: [facility] Agency Address: [address] [place], [place] [address] Phone: [phone] Email: [email or web address] Website: [email or web address] Service(s) Offered: Housing services, self-sufficiency, congregate meal  program, weatherization program, Field seismologist program, emergency food assistance,  housing counseling, home ownership program, wheels -towork program.  Agency Name: [facility] Address: [address] [place], [place] [address] Phone: [phone] (8a-4p) [phone] (8p- 10p) Email: [email or web address] Website: [email or web address] Service(s) Offered: A program for homeless and/or needy men that includes one-on-one counseling, life skills training and job rehabilitation.  Agency Name: [employer] [facility] [place] Address: [address] [place], [place] [address] Phone: [phone] Website: www.alliedchurches.org Service(s) Offered: Assistance to needy in emergency with utility bills, heating  fuel, and prescriptions. Shelter for homeless 7pm-7am. February 18, 2017 15  Agency Name: [facility] [place] (Developmentally  Disabled) Address: [address], [place], [place] [address] Phone: [phone] Contact Person: Cathleen Corti Email: [email or web address] Website: [email or web address] Service(s) Offered: Helps individuals with developmental disabilities move  from housing that is more restrictive to homes where they  can achieve greater independence and have more  opportunities.  Agency Name: [facility] Address: [address], [place], [place] [address] Phone: [phone] Email: [email or web address] Website: www.[email or web address].org Service(s) Offered: Provides affordable housing for low-income families,  elderly, and disabled individuals. Offer a wide range of  programs and services, from financial planning to afterschool and summer programs.  Agency Name: Department of Social Services Address: [address] [place], [place] [address] Phone: [phone] Service(s) Offered: Child support services; child welfare services; food stamps;  Medicaid; work first family assistance; and aid with fuel,  rent, food and medicine.  Agency Name: [facility] [place] [employer]. Address: [facility] [address] [place], [place]  [address] Phone: [phone] Website: www.familyabuseservices.org Service(s) Offered: 24 hour Crisis Line: [phone]; 24 hour Emergency Shelter;  Transitional Housing; Support Groups; Scientist, physiological;  [employer]; Hispanic Outreach: [phone];  Visitation Center: [phone]. February 18, 2017 16  Agency Name: [facility] [place]. Address: [address], [place], [place] [address] Phone: [phone] Service(s) Offered: CAP Services; Home and [employer]; Individual  or Group Supports; Respite Care Non-Institutional Nursing;  Residential Supports; Respite Care and Personal Care  Services; Transportation; Family and Friends Night;  Recreational Activities; Three Nutritious Meals/Snacks;   Consultation with Registered Dietician; Twenty-four hour  Registered Nurse Access; Daily and [employer]; Camp [place] Leaves; [place] for the [employer] (During Summer Months) Bingo Night (Every  Wednesday Night); Special Populations Dance Night  (Every Tuesday Night); Professional Hair Care Services.  Agency Name: [facility] Address: [address], [place], [place] [address] Phone: [phone] Contact Person: Jabier Mutton Website: [email or web address].Physicist, medical) Offered: Residential treatment facility for women; food and  clothing, educational & employment development and  transportation to work; Counsellor of financial skills;  parenting and family reunification; emotional and spiritual  support; transitional housing for program graduates.  Agency Name: [facility] Address: [address], [place], [place] [address] Phone: [phone] Email: [email or web address] Website: [email or web address].es Service(s) Offered: Public housing units for elderly, disabled, and low income  people; housing choice vouchers for income eligible  applicants; shelter plus care vouchers; and [employer] program. February 18, 2017 17  Agency Name: [facility] Address:  [address] [place], [place] [address] Phone: [phone] Email: [email or web address] Website: www.habitatalamance.org Service(s) Offered: Build houses for families in need of decent housing. Each  adult in the family must invest 200 hours of labor on  someone else's house, work with volunteers to build their  own house, attend classes on budgeting, home maintenance, yard care, and attend homeowner association  meetings.  Agency Name: [facility] Address: [address] [place], [place] [address] Phone: [phone] Website: www.rsli.org Service(s) Offered: Intermediate care facilities for mentally  retarded,  Supervised Living in group homes for adults with  developmental disabilities, Supervised Living for people  who have dual diagnoses (MRMI), Independent Living,  Supported Living, respite and a variety of CAP services,  pre-vocational services, day supports, and [employer].  Agency Name: [place] [employer] Phone: [phone] Website: www.NCForeclosurePrevention.gov Service(s) Offered: Zero-interest, deferred loans to homeowners struggling to  pay their mortgage. Call for more information       Transportation Agency Name: [facility] Agency Address: [address] [place], [place] [address] Phone: [phone] Email: [email or web address] Website: www.[email or web address].org Service(s) Offered: [employer], self-sufficiency, congregate meal  program, weatherization program, Field seismologist program, emergency food assistance,  housing counseling, home ownership program, wheels-towork program. February 18, 2017 22  Agency Name: [facility] [employer] [phone]) Address: [address] [place], [place] [address] Phone: [phone] Email:  Website: www.acta-[email or web address] Service(s) Offered: Transportation for general public, subscription and demand  response; Dial-a-Ride for citizens 69 years of age or older. Agency Name: Department of Social Services Address: [address] [place], [place] [address] Phone: [phone] Service(s) Offered: Child support services; child welfare services; food stamps;  Medicaid; work first family assistance; and aid with fuel,  rent, food and medicine, transportation assistance.  Agency Name: Disabled [employer] (DAV) Transportation  Network Address: Phone: [phone] Service(s) Offered: Transports veterans to the [facility] medical center. Call  forty-eight hours in advance and leave the name, telephone  number, date, and time  of appointment. Veteran will be  contacted by the driver the day before the appointment to  arrange a pick up point   [place] to get routine weekly paracentesis scheduled--able to enter the order. Next paracentesis 08/09/22 Call Glynn Octave [phone] or [phone] if you have any questions

## 2022-07-10 NOTE — TOC CM/SW Note (Signed)
Patient is on isolation. TOC consult due to patient being homeless. Patient also flagged on SDOH list.  Attempted to call into room due to precautions, no answer. Asked RN to have patient call CSW when able to resources can be offered/provided.  Alfonso Ramus, [place] [phone]

## 2022-07-10 NOTE — Consult Note (Signed)
NAME: Marco Spears  DOB: 1976/05/04  MRN: [id number]  Date/Time: 07/10/2022 1:45 PM  REQUESTING PROVIDER: Kateri Plummer Subjective:  REASON FOR CONSULT: SBP ? Marco Spears is a 46 y.o. with a history of polysubstance use, decompensated cirrhosis, encephalopathy, treated hepc ,recurrent rt knee bursiits, ESBL ecoli bacteremia, then e.coli bacteremia and MRSA infection bura. Presented to ED on 9/14 with abdominal distension and  pain  07/09/22  BP 116/73  Temp 98.3 F (36.8 C)  Pulse Rate 90  Resp 20  SpO2 98 %    Latest Reference Range & Units 07/09/22  WBC 4.0 - 10.5 K/uL 12.6 (H)  Hemoglobin 13.0 - 17.0 g/dL 85.6 (L)  HCT 31.4 - 97.0 % 31.4 (L)  Platelets 150 - 400 K/uL 123 (L)  Creatinine 0.61 - 1.24 mg/dL 2.63    Multiple hospitalization since he got out of prison earlier this year. Social issues- no ID card, no insurance ,homeless, polysubstance use  04/20/22-04/22/22- hepatic encephalopathy Hypokalemia, pancytopenia, alcoholic cirrhosis  05/05/22-05/13/22   Cellulitis rt leg- infected prepatellar bursitis Group A strep in fluid and blood  05/21/22-05/26/22 septic bursitis - had I/D MRSA in culture sent on linezolid   06/01/22-06/15/22 ESBL e.coli bacteremia and culture from rt bursa had pseudomonas, acinetobacter and kleb- got meropnem for 2 weeks  06/28/22-07/03/22 E.coli bacteremia and sepsis ( not ESBL) Recurrent SBP Decompensated cirrhosis  07/06/22-visited ED for paracentesis but the was Discharged from there    Past Medical History:  Diagnosis Date   Cirrhosis of liver (HCC)    Heart murmur    Polysubstance abuse (HCC)    Septic bursitis     Past Surgical History:  Procedure Laterality Date   INCISION AND DRAINAGE Right 05/07/2022   Procedure: INCISION AND DRAINAGE;  Surgeon: Juanell Fairly, MD;  Location: [facility] ORS;  Service: Orthopedics;  Laterality: Right;   IRRIGATION AND DEBRIDEMENT KNEE Right 05/21/2022   Procedure: IRRIGATION AND DEBRIDEMENT KNEE;  Surgeon:  Juanell Fairly, MD;  Location: [facility] ORS;  Service: Orthopedics;  Laterality: Right;    Social History   Socioeconomic History   Marital status: Single    Spouse name: Not on file   Number of children: Not on file   Years of education: Not on file   Highest education level: Not on file  Occupational History   Not on file  Tobacco Use   Smoking status: Some Days    Types: Cigarettes   Smokeless tobacco: Never  Substance and Sexual Activity   Alcohol use: Yes   Drug use: Yes    Types: Marijuana, Methamphetamines, IV, Cocaine   Sexual activity: Not on file  Other Topics Concern   Not on file  Social History Narrative   Not on file   Social Determinants of Health   Financial Resource Strain: Not on file  Food Insecurity: Food Insecurity Present (07/09/2022)   Hunger Vital Sign    Worried About Running Out of Food in the Last Year: Often true    Ran Out of Food in the Last Year: Often true  Transportation Needs: Unmet Transportation Needs (07/09/2022)   PRAPARE - Administrator, Civil Service (Medical): Yes    Lack of Transportation (Non-Medical): Yes  Physical Activity: Not on file  Stress: Not on file  Social Connections: Not on file  Intimate Partner Violence: Not At Risk (07/09/2022)   Humiliation, Afraid, Rape, and Kick questionnaire    Fear of Current or Ex-Partner: No    Emotionally Abused: No  Physically Abused: No    Sexually Abused: No    History reviewed. No pertinent family history. Allergies  Allergen Reactions   Amoxicillin Rash    TOLERATED CEFAZOLIN PRIOR States it makes his skin turn red   I? Current Facility-Administered Medications  Medication Dose Route Frequency Provider Last Rate Last Admin   0.9 %  sodium chloride infusion  250 mL Intravenous PRN Agbata, Tochukwu, MD       cefTRIAXone (ROCEPHIN) 2 g in sodium chloride 0.9 % 100 mL IVPB  2 g Intravenous Q24H Agbata, Tochukwu, MD 200 mL/hr at 07/10/22 1309 2 g at 07/10/22 1309    enoxaparin (LOVENOX) injection 60 mg  60 mg Subcutaneous Q24H Agbata, Tochukwu, MD   60 mg at 07/10/22 0913   ferrous sulfate tablet 325 mg  325 mg Oral Q breakfast Agbata, Tochukwu, MD   325 mg at 07/10/22 0900   folic acid (FOLVITE) tablet 1 mg  1 mg Oral Daily Agbata, Tochukwu, MD   1 mg at 07/10/22 0913   furosemide (LASIX) tablet 40 mg  40 mg Oral BID Agbata, Tochukwu, MD   40 mg at 07/10/22 0900   LORazepam (ATIVAN) injection 0-4 mg  0-4 mg Intravenous Q6H Irean Hong, MD       multivitamin with minerals tablet 1 tablet  1 tablet Oral Daily Agbata, Tochukwu, MD   1 tablet at 07/10/22 0912   ondansetron (ZOFRAN) tablet 4 mg  4 mg Oral Q6H PRN Agbata, Tochukwu, MD       Or   ondansetron (ZOFRAN) injection 4 mg  4 mg Intravenous Q6H PRN Agbata, Tochukwu, MD       protein supplement (ENSURE MAX) liquid  11 oz Oral BID Manuela Schwartz, NP   11 oz at 07/10/22 0914   sodium chloride flush (NS) 0.9 % injection 3 mL  3 mL Intravenous Q12H Agbata, Tochukwu, MD   3 mL at 07/10/22 0913   sodium chloride flush (NS) 0.9 % injection 3 mL  3 mL Intravenous PRN Agbata, Tochukwu, MD       spironolactone (ALDACTONE) tablet 100 mg  100 mg Oral Daily Agbata, Tochukwu, MD   100 mg at 07/10/22 0911   thiamine (VITAMIN B1) tablet 100 mg  100 mg Oral Daily Irean Hong, MD   100 mg at 07/10/22 0913     Abtx:  Anti-infectives (From admission, onward)    Start     Dose/Rate Route Frequency Ordered Stop   07/09/22 1300  cefTRIAXone (ROCEPHIN) 2 g in sodium chloride 0.9 % 100 mL IVPB        2 g 200 mL/hr over 30 Minutes Intravenous Every 24 hours 07/09/22 0951     07/09/22 0315  ceFEPIme (MAXIPIME) 2 g in sodium chloride 0.9 % 100 mL IVPB        2 g 200 mL/hr over 30 Minutes Intravenous  Once 07/09/22 0314 07/09/22 0541   07/09/22 0315  vancomycin (VANCOCIN) IVPB 1000 mg/200 mL premix       See Hyperspace for full Linked Orders Report.   1,000 mg 200 mL/hr over 60 Minutes Intravenous  Once 07/09/22 0314  07/09/22 0645   07/09/22 0315  vancomycin (VANCOREADY) IVPB 1500 mg/300 mL       See Hyperspace for full Linked Orders Report.   1,500 mg 150 mL/hr over 120 Minutes Intravenous  Once 07/09/22 0314 07/09/22 0800   07/09/22 0300  metroNIDAZOLE (FLAGYL) IVPB 500 mg  500 mg 100 mL/hr over 60 Minutes Intravenous  Once 07/09/22 0259 07/09/22 0443       REVIEW OF SYSTEMS:  Const: negative fever, negative chills, negative weight loss Eyes: negative diplopia or visual changes, negative eye pain ENT: sore tongue and lips Resp: negative cough, hemoptysis, dyspnea Cards: negative for chest pain, palpitations, lower extremity edema GU: negative for frequency, dysuria and hematuria GI:  abdominal pain, distension Skin: negative for rash and pruritus Heme: negative for easy bruising and gum/nose bleeding MS: generalized weakness and fatigue Neurolo:negative for headaches, dizziness, vertigo, memory problems  Psych:anxiety, depression  Endocrine: negative for thyroid, diabetes Allergy/Immunology- amoxicillin- rash: Objective:  VITALS:  BP 116/70 (BP Location: Left Arm)   Pulse 94   Temp (!) 97.5 F (36.4 C) (Oral)   Resp 18   Ht 6\' 2"  (1.88 m)   Wt 120 kg   SpO2 96%   BMI 33.97 kg/m   PHYSICAL EXAM:  General: Alert, cooperative, no distress, appears stated age.  Head: Normocephalic, without obvious abnormality, atraumatic. Eyes: Conjunctivae clear, anicteric sclerae. Pupils are equal ENT Nares normal. No drainage or sinus tenderness. Lips sores, oral cavity- tongue red  Neck: Supple, symmetrical, no adenopathy, thyroid: non tender no carotid bruit and no JVD. Back: No CVA tenderness. Lungs:b/l air entry decreased bases Heart: Regular rate and rhythm, no murmur, rub or gallop. Abdomen: Soft,  distended. Bowel sounds normal. No masses Extremities:edema legs Rt knee sueprfical wound-    Skin: No rashes or lesions. Or bruising Lymph: Cervical, supraclavicular  normal. Neurologic: Grossly non-focal Pertinent Labs Lab Results    Latest Ref Rng & Units 07/10/2022    5:12 AM 07/09/2022    1:25 AM 07/06/2022    2:18 PM  CBC  WBC 4.0 - 10.5 K/uL 6.3  12.6  8.0   Hemoglobin 13.0 - 17.0 g/dL 09/05/2022  29.7  98.9   Hematocrit 39.0 - 52.0 % 30.7  31.4  29.8   Platelets 150 - 400 K/uL 116  123  99          Latest Ref Rng & Units 07/10/2022    5:12 AM 07/09/2022    1:25 AM 07/06/2022    2:18 PM  CMP  Glucose 70 - 99 mg/dL 09/05/2022  941  740   BUN 6 - 20 mg/dL 7  10  16    Creatinine 0.61 - 1.24 mg/dL 814   4.81   Sodium 135 - 145 mmol/L 130  128  127   Potassium 3.5 - 5.1 mmol/L 3.6  2.9  3.2   Chloride 98 - 111 mmol/L 102  97  90   CO2 22 - 32 mmol/L 24  24  27    Calcium 8.9 - 10.3 mg/dL 7.2  7.3  8.3   Total Protein 6.5 - 8.1 g/dL  5.6  6.4   Total Bilirubin 0.3 - 1.2 mg/dL  5.2  7.8   Alkaline Phos 38 - 126 U/L  105  126   AST 15 - 41 U/L  47  72   ALT 0 - 44 U/L  30  43    Peritoneal fluid  Latest Reference Range & Units 07/09/22 12:31  Color, Fluid YELLOW  YELLOW !  Total Nucleated Cell Count, Fluid cu mm 1,644  Fluid Type-FCT  CYTO PERI  Lymphs, Fluid % 6  Eos, Fluid % 0  Appearance, Fluid CLEAR  CLOUDY !  Neutrophil Count, Fluid % 74  Monocyte-Macrophage-Serous Fluid % 20  !: Data is  abnormal   Microbiology: 05/07/22- Rt knee wound culture- Group A strep 05/21/22 RT knee surgical wound MRSA 06/01/22 4/4 ESBL ecoli in blood 06/01/22 rt knee wound- kleb, pseudomonas and acinetobacter 06/05/22 BC- NG 06/28/22 BC- E.coli 06/28/22 periotneal fluid NG  9/14 BC  06/02/22 peritoneal fluid NG  IMAGING RESULTS: CXR - no infitrate I have personally reviewed the films ? Impression/Recommendation Decompensated cirrhosis with ascites Needing multiple paracentesis Need to consider TIPS HE [facility] he does not have transport to go to the clinic.  SBP- on ceftriaxone- culture pending  will have to be on chronic  prophylaxis  Rt knee pre patellar bursitis- s/p multiple debridement- the wound has not healed- will do a culture after cleaning well with saline  Anemia thrombocytopenia  H/o ESBL bacteremia  H/o ECOLi bacteremia  H/o multiple infection Polysubstance use  Social issues including homelessness, no ID , no insurance  Recurrent admissions    Discussed the management with the patient and care team________________

## 2022-07-10 NOTE — Progress Notes (Signed)
Attempted to obtain culture for wound. Patient stated that he wanted to sleep and get it done later. Will let on coming nurse know.

## 2022-07-10 NOTE — Progress Notes (Signed)
  PROGRESS NOTE    Marco Spears  QIH:[id number] DOB: Nov 01, 1975 DOA: 07/09/2022 PCP: Pcp, No  [id number]-AA  LOS: 0 days   Brief hospital course: No notes on file  Assessment & Plan: Marco Spears is a 46 y.o. with a history of polysubstance use, decompensated cirrhosis, recurrent ascites and frequent paracentesis, encephalopathy, treated hepc, recurrent rt knee bursiits, ESBL ecoli bacteremia, then e.coli bacteremia and MRSA infection bura. Presented to ED on 9/14 with abdominal distension and pain.   Decompensated alcoholic cirrhosis with ascites Frequent paracentesis  --received paracentesis on 9/14, with only 1.7L removed. -- used to follow up at [facility] he does not have transport to go to the clinic. Plan: --cont lasix and aldactone --Need to consider TIPS  SBP --was just treated for SBP <2 weeks ago --started on ceftriaxone on presentation Plan: --ID consult today --cont ceftriaxone- culture pending   --will have to be on chronic prophylaxis   Hx of prepatellar bursitis I&D x 2 (7/13, 7/27) with Dr. 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  Pt was persistently arguing to have his salt and fluid restriction (1500 ml) lifted.   Objective: Vitals:   07/09/22 2129 07/10/22 0456 07/10/22 0910 07/10/22 1721  BP: 116/73 116/74 116/70 117/79  Pulse: 90 94 94 90  Resp: 20 20 18 18   Temp: 98.3 F (36.8 C) 98.7 F (37.1 C) (!)  97.5 F (36.4 C) 98.3 F (36.8 C)  TempSrc: Oral Oral Oral Oral  SpO2: 98% 96% 96% 100%  Weight:      Height:        Intake/Output Summary (Last 24 hours) at 07/10/2022 1823 Last data filed at 07/10/2022 1251 Gross per 24 hour  Intake 1098 ml  Output 3000 ml  Net -1902 ml   Filed Weights   07/09/22 1100  Weight: 120 kg    Examination:   Constitutional: NAD, AAOx3 HEENT: conjunctivae and lids normal, EOMI CV: No cyanosis.   RESP: normal respiratory effort, on RA Extremities: right knee wound clean and dry without signs of infection SKIN: warm, dry Neuro: II - XII grossly intact.   Psych: depressed mood and affect.     Data Reviewed: I have personally reviewed labs and imaging studies  Time spent: 50 minutes  07/11/22, MD [facility] If 7PM-7AM, please contact night-coverage 07/10/2022, 6:23 PM

## 2022-07-10 NOTE — TOC Initial Note (Signed)
Transition of Care [facility]) - Initial/Assessment Note    Patient Details  Name: Marco Spears MRN: [id number] Date of Birth: Sep 10, 1976  Transition of [facility]) CM/SW Contact:    Liliana Cline, LCSW Phone Number: 07/10/2022, 3:40 PM  Clinical Narrative:                 Spoke to patient via phone due to contact precautions. Patient is homeless. Previously he discharged to his brother's home but reported this is no longer an option as he is incarcerated. Patient states he is trying to figure out where he will go at [place]. He stated [place] camps in the woods. Patient states he will likely go to camp at a location near [address] and [address] in [place]. Patient confirms he has [facility] information, he stated his ex spouse is bringing his birth certificate and proof of residence to him so that he can work on getting an ID. Patient encouraged to reach out to [facility] about his situation as well (not having an ID) and they may work with him. Patient states he uses the [employer] bus for transportation.  Patient agreeable to resources. Patient flagged for SDOH resources for food, housing, transportation, and utilities (NA). Resources added to AVS. Patient aware.   Expected Discharge Plan: Homeless Shelter (homeless lives in woods) Barriers to Discharge: Continued Medical Work up   Patient Goals and CMS Choice   CMS Medicare.gov Compare Post Acute Care list provided to:: Patient Choice offered to / list presented to : Patient  Expected Discharge Plan and Services Expected Discharge Plan: Homeless Shelter (homeless lives in woods)       Living arrangements for the past 2 months: No permanent address                                      Prior Living Arrangements/Services Living arrangements for the past 2 months: No permanent address Lives with:: Self Patient language and need for interpreter reviewed:: Yes Do you feel safe going back to the place where you live?: Yes       Need for Family Participation in Patient Care: Yes (Comment) Care giver support system in place?: No (comment)   Criminal Activity/Legal Involvement Pertinent to Current Situation/Hospitalization: No - Comment as needed  Activities of Daily Living Home Assistive Devices/Equipment: None ADL Screening (condition at time of admission) Patient's cognitive ability adequate to safely complete daily activities?: Yes Is the patient deaf or have difficulty hearing?: No Does the patient have difficulty seeing, even when wearing glasses/contacts?: No Does the patient have difficulty concentrating, remembering, or making decisions?: No Patient able to express need for assistance with ADLs?: Yes Does the patient have difficulty dressing or bathing?: No Independently performs ADLs?: Yes (appropriate for developmental age) Does the patient have difficulty walking or climbing stairs?: No Weakness of Legs: None Weakness of Arms/Hands: None  Permission Sought/Granted Permission sought to share information with : Oceanographer granted to share information with : Yes, Verbal Permission Granted     Permission granted to share info w AGENCY: as needed        Emotional Assessment       Orientation: : Oriented to Self, Oriented to Place, Oriented to  Time, Oriented to Situation      Admission diagnosis:  Hypokalemia [E87.6] Hypomagnesemia [E83.42] Hyponatremia [E87.1] Right upper quadrant abdominal pain [R10.11] Ascites [R18.8] Ascites  due to alcoholic cirrhosis (HCC) [K70.31] Nausea and vomiting, unspecified vomiting type [R11.2] Abdominal pain [R10.9] Patient Active Problem List   Diagnosis Date Noted   Ascites 07/09/2022   Abdominal pain 07/09/2022   Hypomagnesemia 07/09/2022   Hyponatremia 07/09/2022   Malnutrition of moderate degree 06/11/2022   Portal hypertension (HCC) 06/02/2022   Bacteremia 06/02/2022   AMS (altered mental status) 06/01/2022    Thrombocytopenia (HCC) 06/01/2022   Septic bursitis 05/22/2022   Septic arthritis (HCC) 05/21/2022   Alcohol abuse 05/21/2022   On anticoagulant therapy 05/21/2022   Hypocalcemia 05/21/2022   Tongue lesion 05/21/2022   Cellulitis of right leg    Cirrhosis of liver without ascites (HCC)    Polysubstance abuse (HCC)    Cellulitis 05/05/2022   Sepsis (HCC) 05/05/2022   Alcoholic cirrhosis of liver with ascites (HCC) 04/21/2022   Hypokalemia 04/21/2022   Anemia 04/21/2022   PCP:  Pcp, No Pharmacy:   [facility] [address] [place] [address] Phone: [phone] Fax: [phone]     Social Determinants of Health (SDOH) Interventions    Readmission Risk Interventions    05/06/2022    2:26 PM  Readmission Risk Prevention Plan  Transportation Screening Complete  Medication Review (RN Care Manager) Complete

## 2022-07-11 DIAGNOSIS — K7031 Alcoholic cirrhosis of liver with ascites: Principal | ICD-10-CM

## 2022-07-11 LAB — BASIC METABOLIC PANEL
Anion gap: 5 (ref 5–15)
BUN: 9 mg/dL (ref 6–20)
CO2: 27 mmol/L (ref 22–32)
Calcium: 7.5 mg/dL — ABNORMAL LOW (ref 8.9–10.3)
Chloride: 100 mmol/L (ref 98–111)
Creatinine, Ser: 0.6 mg/dL — ABNORMAL LOW (ref 0.61–1.24)
GFR, Estimated: >60 mL/min (ref >60)
Glucose, Bld: 100 mg/dL — ABNORMAL HIGH (ref 70–99)
Potassium: 3.9 mmol/L (ref 3.5–5.1)
Sodium: 132 mmol/L — ABNORMAL LOW (ref 135–145)

## 2022-07-11 LAB — CBC
HCT: 30.8 % — ABNORMAL LOW (ref 39.0–52.0)
Hemoglobin: 10 g/dL — ABNORMAL LOW (ref 13.0–17.0)
MCH: 31.3 pg (ref 26.0–34.0)
MCHC: 32.5 g/dL (ref 30.0–36.0)
MCV: 96.6 fL (ref 80.0–100.0)
Platelets: 110 10*3/uL — ABNORMAL LOW (ref 150–400)
RBC: 3.19 MIL/uL — ABNORMAL LOW (ref 4.22–5.81)
RDW: 17.3 % — ABNORMAL HIGH (ref 11.5–15.5)
WBC: 4.1 10*3/uL (ref 4.0–10.5)
nRBC: 0 % (ref 0.0–0.2)

## 2022-07-11 LAB — MAGNESIUM: Magnesium: 1.6 mg/dL — ABNORMAL LOW (ref 1.7–2.4)

## 2022-07-11 LAB — PHOSPHORUS: Phosphorus: 2.2 mg/dL — ABNORMAL LOW (ref 2.5–4.6)

## 2022-07-11 MED ORDER — K PHOS MONO-SOD PHOS DI & MONO 155-852-130 MG PO TABS
500.0000 mg | ORAL_TABLET | ORAL | Status: AC
  Administered 2022-07-11 (×2): 500 mg via ORAL
  Filled 2022-07-11 (×2): qty 2

## 2022-07-11 MED ORDER — MAGNESIUM SULFATE 2 GM/50ML IV SOLN
2.0000 g | Freq: Once | INTRAVENOUS | Status: AC
  Administered 2022-07-11: 2 g via INTRAVENOUS
  Filled 2022-07-11: qty 50

## 2022-07-11 NOTE — Consult Note (Signed)
PHARMACY CONSULT NOTE - FOLLOW UP  Pharmacy Consult for Electrolyte Monitoring and Replacement   Recent Labs: Potassium (mmol/L)  Date Value  07/11/2022 3.9   Magnesium (mg/dL)  Date Value  07/11/2022 1.6 (L)   Calcium (mg/dL)  Date Value  07/11/2022 7.5 (L)   Albumin (g/dL)  Date Value  07/09/2022 2.2 (L)   Phosphorus (mg/dL)  Date Value  07/11/2022 2.2 (L)   Sodium (mmol/L)  Date Value  07/11/2022 132 (L)   Corrected Ca 8.9  Assessment: 46 y.o. with a history of polysubstance use, decompensated cirrhosis, recurrent ascites and frequent paracentesis, encephalopathy, treated hepc, recurrent rt knee bursiits, ESBL ecoli bacteremia, then e.coli bacteremia and MRSA infection bura. Presented to the ED for abdominal distention and pain.   - pt is on spironolactone.   Goal of Therapy:  WNL  Plan:  Kphos 2 tabs x 2 Mg 2 g IV x 1.  F/u with AM labs.   Oswald Hillock ,PharmD Clinical Pharmacist 07/11/2022 10:12 AM

## 2022-07-11 NOTE — Plan of Care (Signed)
  Problem: Education: Goal: Knowledge of General Education information will improve Description: Including pain rating scale, medication(s)/side effects and non-pharmacologic comfort measures 07/11/2022 0459 by Lydia Guiles, RN Outcome: Progressing 07/11/2022 0459 by Lydia Guiles, RN Outcome: Progressing   Problem: Safety: Goal: Ability to remain free from injury will improve 07/11/2022 0459 by Lydia Guiles, RN Outcome: Progressing 07/11/2022 0459 by Lydia Guiles, RN Outcome: Progressing   Problem: Skin Integrity: Goal: Risk for impaired skin integrity will decrease 07/11/2022 0459 by Lydia Guiles, RN Outcome: Progressing 07/11/2022 0459 by Lydia Guiles, RN Outcome: Progressing   Problem: Pain Managment: Goal: General experience of comfort will improve 07/11/2022 0459 by Lydia Guiles, RN Outcome: Progressing 07/11/2022 0459 by Lydia Guiles, RN Outcome: Progressing   Problem: Coping: Goal: Level of anxiety will decrease 07/11/2022 0459 by Lydia Guiles, RN Outcome: Progressing 07/11/2022 0459 by Lydia Guiles, RN Outcome: Progressing   Problem: Nutrition: Goal: Adequate nutrition will be maintained 07/11/2022 0459 by Lydia Guiles, RN Outcome: Progressing 07/11/2022 0459 by Lydia Guiles, RN Outcome: Progressing

## 2022-07-11 NOTE — Progress Notes (Signed)
  PROGRESS NOTE    Marco Spears  ZOX:[id number] DOB: [birth year] DOA: 07/09/2022 PCP: Pcp, No  [id number]  LOS: 1 day   Brief hospital course: No notes on file  Assessment & Plan: Marco Spears is a 46 y.o. with a history of polysubstance use, decompensated cirrhosis, recurrent ascites and frequent paracentesis, encephalopathy, treated hepc, recurrent rt knee bursiits, ESBL ecoli bacteremia, then e.coli bacteremia and MRSA infection bura. Presented to ED on 9/14 with abdominal distension and pain.   Decompensated alcoholic cirrhosis with ascites Frequent paracentesis  --received paracentesis on 9/14, with only 1.7L removed. -- used to follow up at [facility] he does not have transport to go to the clinic. Plan: --cont lasix and aldactone --Need to consider TIPS  SBP --was just treated for SBP <2 weeks ago --started on ceftriaxone on presentation --ID consulted Plan: --cont ceftriaxone pending cx --will have to be on chronic prophylaxis   Hx of prepatellar bursitis I&D x 2 (7/13, 7/27) with Dr. 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  Good urine output for the past day.   Objective: Vitals:   07/10/22 1721 07/10/22 2015 07/11/22 0907 07/11/22 1542  BP: 117/79 114/72 117/78 111/70  Pulse: 90 97 94 85  Resp: 18 18 18 18   Temp: 98.3 F (36.8 C) 98.6 F (37 C) 98.6 F (37 C) 98.3 F (36.8 C)  TempSrc: Oral Oral Oral Oral   SpO2: 100% 98% 99% 97%  Weight:      Height:        Intake/Output Summary (Last 24 hours) at 07/11/2022 1844 Last data filed at 07/11/2022 1700 Gross per 24 hour  Intake 820 ml  Output 2450 ml  Net -1630 ml   Filed Weights   07/09/22 1100  Weight: 120 kg    Examination:   Constitutional: NAD CV: No cyanosis.   RESP: normal respiratory effort, on RA Extremities: No effusions, edema in BLE   Data Reviewed: I have personally reviewed labs and imaging studies  Time spent: 25 minutes  Enzo Bi, MD [facility] If 7PM-7AM, please contact night-coverage 07/11/2022, 6:44 PM

## 2022-07-12 LAB — BASIC METABOLIC PANEL
Anion gap: 6 (ref 5–15)
BUN: 12 mg/dL (ref 6–20)
CO2: 24 mmol/L (ref 22–32)
Calcium: 7.8 mg/dL — ABNORMAL LOW (ref 8.9–10.3)
Chloride: 102 mmol/L (ref 98–111)
Creatinine, Ser: 0.4 mg/dL — ABNORMAL LOW (ref 0.61–1.24)
GFR, Estimated: >60 mL/min (ref >60)
Glucose, Bld: 111 mg/dL — ABNORMAL HIGH (ref 70–99)
Potassium: 3.9 mmol/L (ref 3.5–5.1)
Sodium: 132 mmol/L — ABNORMAL LOW (ref 135–145)

## 2022-07-12 LAB — CBC
HCT: 31.9 % — ABNORMAL LOW (ref 39.0–52.0)
Hemoglobin: 10.6 g/dL — ABNORMAL LOW (ref 13.0–17.0)
MCH: 32 pg (ref 26.0–34.0)
MCHC: 33.2 g/dL (ref 30.0–36.0)
MCV: 96.4 fL (ref 80.0–100.0)
Platelets: 121 10*3/uL — ABNORMAL LOW (ref 150–400)
RBC: 3.31 MIL/uL — ABNORMAL LOW (ref 4.22–5.81)
RDW: 17.5 % — ABNORMAL HIGH (ref 11.5–15.5)
WBC: 4.7 10*3/uL (ref 4.0–10.5)
nRBC: 0 % (ref 0.0–0.2)

## 2022-07-12 LAB — MAGNESIUM: Magnesium: 1.7 mg/dL (ref 1.7–2.4)

## 2022-07-12 LAB — PHOSPHORUS: Phosphorus: 2.8 mg/dL (ref 2.5–4.6)

## 2022-07-12 MED ORDER — MAGIC MOUTHWASH
10.0000 mL | Freq: Once | ORAL | Status: AC
  Administered 2022-07-12: 10 mL via ORAL
  Filled 2022-07-12: qty 10

## 2022-07-12 MED ORDER — MAGNESIUM SULFATE 2 GM/50ML IV SOLN
2.0000 g | Freq: Once | INTRAVENOUS | Status: DC
  Filled 2022-07-12: qty 50

## 2022-07-12 MED ORDER — MAGNESIUM OXIDE -MG SUPPLEMENT 400 (240 MG) MG PO TABS
400.0000 mg | ORAL_TABLET | Freq: Two times a day (BID) | ORAL | Status: AC
  Administered 2022-07-12 (×2): 400 mg via ORAL
  Filled 2022-07-12 (×2): qty 1

## 2022-07-12 NOTE — Progress Notes (Signed)
  PROGRESS NOTE    Marco Spears  AOZ:[id number] DOB: June 08, 1976 DOA: 07/09/2022 PCP: Pcp, No  [id number]  LOS: 2 days   Brief hospital course: No notes on file  Assessment & Plan: Marco Spears is a 46 y.o. with a history of polysubstance use, decompensated cirrhosis, recurrent ascites and frequent paracentesis, encephalopathy, treated hepc, recurrent rt knee bursiits, ESBL ecoli bacteremia, then e.coli bacteremia and MRSA infection bura. Presented to ED on 9/14 with abdominal distension and pain.   Decompensated alcoholic cirrhosis with ascites Frequent paracentesis  --received paracentesis on 9/14, with only 1.7L removed. -- used to follow up at [facility] he does not have transport to go to the clinic. Plan: --cont lasix and aldactone --Need to consider TIPS --paracentesis tomorrow  SBP --was just treated for SBP <2 weeks ago --started on ceftriaxone on presentation --ID consulted Plan: --cont ceftriaxone pending cx --will have to be on chronic prophylaxis   Hx of prepatellar bursitis I&D x 2 (7/13, 7/27) with Dr. 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     Objective: Vitals:   07/12/22 0219 07/12/22 0750 07/12/22 1541 07/12/22 1547  BP: 116/79 112/69  121/75  Pulse: 99 99 97 96  Resp: 18 18 16    Temp: 98.4 F (36.9 C) 99.4 F (37.4 C) 99 F (37.2 C)   TempSrc: Oral Oral Oral   SpO2: 96%  99% 96%   Weight:      Height:        Intake/Output Summary (Last 24 hours) at 07/12/2022 1710 Last data filed at 07/12/2022 1312 Gross per 24 hour  Intake 523.83 ml  Output 1800 ml  Net -1276.17 ml   Filed Weights   07/09/22 1100  Weight: 120 kg    Examination:   Constitutional: NAD, AAOx3 HEENT: conjunctivae and lids normal, EOMI CV: No cyanosis.   RESP: normal respiratory effort, on RA GI: abdomen large Extremities: edema in BLE SKIN: warm, dry Neuro: II - XII grossly intact.   Psych: depressed mood and affect.     Data Reviewed: I have personally reviewed labs and imaging studies  Time spent: 25 minutes  Enzo Bi, MD [facility] If 7PM-7AM, please contact night-coverage 07/12/2022, 5:10 PM

## 2022-07-12 NOTE — Consult Note (Addendum)
PHARMACY CONSULT NOTE - FOLLOW UP  Pharmacy Consult for Electrolyte Monitoring and Replacement   Recent Labs: Potassium (mmol/L)  Date Value  07/12/2022 3.9   Magnesium (mg/dL)  Date Value  07/12/2022 1.7   Calcium (mg/dL)  Date Value  07/12/2022 7.8 (L)   Albumin (g/dL)  Date Value  07/09/2022 2.2 (L)   Phosphorus (mg/dL)  Date Value  07/12/2022 2.8   Sodium (mmol/L)  Date Value  07/12/2022 132 (L)   Corrected Ca 8.9  Assessment: 46 y.o. with a history of polysubstance use, decompensated cirrhosis, recurrent ascites and frequent paracentesis, encephalopathy, treated hepc, recurrent rt knee bursiits, ESBL ecoli bacteremia, then e.coli bacteremia and MRSA infection bura. Presented to the ED for abdominal distention and pain.   - pt is on spironolactone.   Goal of Therapy:  WNL  Plan:  Pt states Mg IV burns. Changed to Mg oxide 400 mg x 2.  F/u with AM labs.   Oswald Hillock ,PharmD Clinical Pharmacist 07/12/2022 7:40 AM

## 2022-07-13 ENCOUNTER — Inpatient Hospital Stay: Payer: Medicaid Other

## 2022-07-13 DIAGNOSIS — K7031 Alcoholic cirrhosis of liver with ascites: Secondary | ICD-10-CM

## 2022-07-13 LAB — CBC
HCT: 31.2 % — ABNORMAL LOW (ref 39.0–52.0)
Hemoglobin: 10.5 g/dL — ABNORMAL LOW (ref 13.0–17.0)
MCH: 32.5 pg (ref 26.0–34.0)
MCHC: 33.7 g/dL (ref 30.0–36.0)
MCV: 96.6 fL (ref 80.0–100.0)
Platelets: 119 10*3/uL — ABNORMAL LOW (ref 150–400)
RBC: 3.23 MIL/uL — ABNORMAL LOW (ref 4.22–5.81)
RDW: 17.9 % — ABNORMAL HIGH (ref 11.5–15.5)
WBC: 5.4 10*3/uL (ref 4.0–10.5)
nRBC: 0 % (ref 0.0–0.2)

## 2022-07-13 LAB — BODY FLUID CELL COUNT WITH DIFFERENTIAL
Eos, Fluid: 0 %
Lymphs, Fluid: 25 %
Monocyte-Macrophage-Serous Fluid: 27 %
Neutrophil Count, Fluid: 48 %
Total Nucleated Cell Count, Fluid: 288 cu mm

## 2022-07-13 LAB — BASIC METABOLIC PANEL
Anion gap: 7 (ref 5–15)
BUN: 14 mg/dL (ref 6–20)
CO2: 24 mmol/L (ref 22–32)
Calcium: 7.8 mg/dL — ABNORMAL LOW (ref 8.9–10.3)
Chloride: 102 mmol/L (ref 98–111)
Creatinine, Ser: 0.53 mg/dL — ABNORMAL LOW (ref 0.61–1.24)
GFR, Estimated: >60 mL/min (ref >60)
Glucose, Bld: 90 mg/dL (ref 70–99)
Potassium: 4 mmol/L (ref 3.5–5.1)
Sodium: 133 mmol/L — ABNORMAL LOW (ref 135–145)

## 2022-07-13 LAB — BODY FLUID CULTURE W GRAM STAIN: Culture: NO GROWTH

## 2022-07-13 LAB — PATHOLOGIST SMEAR REVIEW

## 2022-07-13 LAB — PHOSPHORUS: Phosphorus: 3.1 mg/dL (ref 2.5–4.6)

## 2022-07-13 LAB — MAGNESIUM: Magnesium: 1.7 mg/dL (ref 1.7–2.4)

## 2022-07-13 MED ORDER — CYCLOBENZAPRINE HCL 10 MG PO TABS
10.0000 mg | ORAL_TABLET | Freq: Two times a day (BID) | ORAL | Status: DC
  Administered 2022-07-13 – 2022-07-16 (×6): 10 mg via ORAL
  Filled 2022-07-13 (×6): qty 1

## 2022-07-13 MED ORDER — ALBUMIN HUMAN 25 % IV SOLN
50.0000 g | Freq: Once | INTRAVENOUS | Status: AC
  Administered 2022-07-13: 50 g via INTRAVENOUS
  Filled 2022-07-13: qty 200

## 2022-07-13 MED ORDER — IOHEXOL 350 MG/ML SOLN
100.0000 mL | Freq: Once | INTRAVENOUS | Status: AC | PRN
  Administered 2022-07-13: 100 mL via INTRAVENOUS

## 2022-07-13 NOTE — Progress Notes (Signed)
  PROGRESS NOTE    Marco Spears  BLT:[id number] DOB: [birth year] DOA: 07/09/2022 PCP: Pcp, No  [id number]  LOS: 3 days   Brief hospital course: No notes on file  Assessment & Plan: Marco Spears is a 46 y.o. with a history of polysubstance use, decompensated cirrhosis, recurrent ascites and frequent paracentesis, encephalopathy, treated hepc, recurrent rt knee bursiits, ESBL ecoli bacteremia, then e.coli bacteremia and MRSA infection bura. Presented to ED on 9/14 with abdominal distension and pain.   Decompensated alcoholic cirrhosis with ascites Frequent paracentesis  --received paracentesis on 9/14, with only 1.7L removed. -- used to follow up at [facility] he does not have transport to go to the clinic. Plan: --cont lasix and aldactone --paracentesis today with 7.6L removed.  IV albumin 50g given. --cont to discuss with Dr. Serafina Royals the option of TIPS  SBP --was just treated for SBP <2 weeks ago --started on ceftriaxone on presentation --ID consulted Plan: --cont ceftriaxone --will have to be on chronic prophylaxis   Hx of prepatellar bursitis I&D x 2 (7/13, 7/27) with Dr. Mack Guise - the wound has not healed.  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  IV albumin 50g given.  Pt complained of cramp in his arms and hands today.  Discussed with  Dr. Serafina Royals about possibility of TIPS for pt.     Objective: Vitals:   07/12/22 2039 07/13/22 0322 07/13/22 0740 07/13/22 1111  BP: 119/71 107/63 119/81 130/80  Pulse: (!) 103 93 86 92  Resp: 20 18 18    Temp: 98.8 F (37.1 C) 98.3 F (36.8 C) 98.3 F (36.8 C)   TempSrc: Oral Oral Oral   SpO2: 98% 96% 99% 95%  Weight:      Height:        Intake/Output Summary (Last 24 hours) at 07/13/2022 1921 Last data filed at 07/13/2022 1700 Gross per 24 hour  Intake 600 ml  Output 1400 ml  Net -800 ml   Filed Weights   07/09/22 1100  Weight: 120 kg    Examination:   Constitutional: NAD, AAOx3 HEENT: conjunctivae and lids normal, EOMI, mumbled speech CV: No cyanosis.   RESP: normal respiratory effort, on RA Neuro: II - XII grossly intact.   Psych: depressed mood and affect.     Data Reviewed: I have personally reviewed labs and imaging studies  Time spent: 35 minutes  Enzo Bi, MD [facility] If 7PM-7AM, please contact night-coverage 07/13/2022, 7:21 PM

## 2022-07-13 NOTE — Procedures (Signed)
PROCEDURE SUMMARY:  Successful ultrasound guided paracentesis from the right lower quadrant.  Yielded 7.6 L of clear yellow fluid.  No immediate complications.  The patient tolerated the procedure well.   Specimen sent for labs.  EBL < 2 mL  The patient has previously been formally evaluated by the [facility] and is being actively followed for potential future intervention.  Marco Spears, [place] [phone] 07/13/2022, 12:37 PM

## 2022-07-13 NOTE — Progress Notes (Signed)
ID Multiple hospitalization since he got out of prison earlier this year. Social issues- no ID card, no insurance ,homeless, polysubstance use   04/20/22-04/22/22- hepatic encephalopathy Hypokalemia, pancytopenia, alcoholic cirrhosis   02/02/80-1/91/47   Cellulitis rt leg- infected prepatellar bursitis Group A strep in fluid and blood   05/21/22-05/26/22 septic bursitis - had I/D MRSA in culture sent on linezolid     06/01/22-06/15/22 ESBL e.coli bacteremia and culture from rt bursa had pseudomonas, acinetobacter and kleb- got meropnem for 2 weeks   06/28/22-07/03/22 E.coli bacteremia and sepsis ( not ESBL) Recurrent SBP Decompensated cirrhosis      Pt had another paracentesis today 7.108ml of fluid removed Doing fine No pain abdomen   O/e awake and alert Patient Vitals for the past 24 hrs:  BP Temp Temp src Pulse Resp SpO2  07/13/22 1111 130/80 -- -- 92 -- 95 %  07/13/22 0740 119/81 98.3 F (36.8 C) Oral 86 18 99 %  07/13/22 0322 107/63 98.3 F (36.8 C) Oral 93 18 96 %  07/12/22 2039 119/71 98.8 F (37.1 C) Oral (!) 103 20 98 %   Chest b/l air entry [id number] Abd soft Some distension Edema legs Rt knee wound  Labs    Latest Ref Rng & Units 07/13/2022    5:27 AM 07/12/2022    6:45 AM 07/11/2022    6:32 AM  CBC  WBC 4.0 - 10.5 K/uL 5.4  4.7  4.1   Hemoglobin 13.0 - 17.0 g/dL 10.5  10.6  10.0   Hematocrit 39.0 - 52.0 % 31.2  31.9  30.8   Platelets 150 - 400 K/uL 119  121  110        Latest Ref Rng & Units 07/13/2022    5:27 AM 07/12/2022    6:45 AM 07/11/2022    6:32 AM  CMP  Glucose 70 - 99 mg/dL 90  111  100   BUN 6 - 20 mg/dL 14  12  9    Creatinine 0.61 - 1.24 mg/dL 0.53  0.40  0.60   Sodium 135 - 145 mmol/L 133  132  132   Potassium 3.5 - 5.1 mmol/L 4.0  3.9  3.9   Chloride 98 - 111 mmol/L 102  102  100   CO2 22 - 32 mmol/L 24  24  27    Calcium 8.9 - 10.3 mg/dL 7.8  7.8  7.5      Microbiology: 05/07/22- Rt knee wound culture- Group A strep 05/21/22 RT knee surgical  wound MRSA 06/01/22 4/4 ESBL ecoli in blood 06/01/22 rt knee wound- kleb, pseudomonas and acinetobacter 06/05/22 BC- NG 06/28/22 BC- E.coli 06/28/22 periotneal fluid NG   9/14 BC   9/15 wound culture rt knee  prelm=in staph aureus    Impression/recommendation Decompensated cirrhosis with ascites Needing multiple paracentesis Need to consider TIPS HE used to follow up at [facility] he does not have transport to go to the clinic.   SBP- on ceftriaxone- culture negative  will have to be on chronic prophylaxis   Rt knee pre patellar bursitis- s/p multiple debridement- the wound has not healed- culture prelim report staph aureus- await susceptibility   Anemia thrombocytopenia   H/o ESBL bacteremia   H/o ECOLi bacteremia   H/o multiple infection Polysubstance use   Social issues including homelessness, no ID , no insurance   Recurrent admissions   Discussed the management with patient and care team

## 2022-07-13 NOTE — Consult Note (Signed)
Chief Complaint: Patient was seen in consultation today for cirrhosis/ascites  Referring Physician(s): Dr. Billie Ruddy  Supervising Physician: Ruthann Cancer  Patient Status: [place] - In-pt  History of Present Illness: Marco Spears is a 46 y.o. male with a medical history significant for polysubstance abuse, cirrhosis, hepatitis C and DVT with prior Eliquis usage. He has a history of incarceration for the past 20+ years and was recently released from prison. He is currently homeless and continues to use illicit substances.   Since being released from prison in June 2023 he has had six hospital admissions through the ED for complications related to cirrhosis including fluid volume overload and altered mental status, sepsis secondary to recurrent right knee bursitis and SBP. His most recent admission started 07/09/22 when he presented to the ED for worsening abdominal distention, weakness, nausea and vomiting.   This patient is well-known to IR from numerous paracenteses over the past few months. He has been evaluated by the [facility] for possible TIPS procedure and found to be a marginal candidate primarily due to his socioeconomic status and homelessness.   Due to his recurrent admissions for problems related to cirrhosis/ascites, Interventional Radiology was asked to evaluate this patient for possible TIPS.   Past Medical History:  Diagnosis Date   Cirrhosis of liver ([place])    Heart murmur    Polysubstance abuse ([place])    Septic bursitis     Past Surgical History:  Procedure Laterality Date   INCISION AND DRAINAGE Right 05/07/2022   Procedure: INCISION AND DRAINAGE;  Surgeon: Thornton Park, MD;  Location: [facility] ORS;  Service: Orthopedics;  Laterality: Right;   IRRIGATION AND DEBRIDEMENT KNEE Right 05/21/2022   Procedure: IRRIGATION AND DEBRIDEMENT KNEE;  Surgeon: Thornton Park, MD;  Location: [facility] ORS;  Service: Orthopedics;  Laterality: Right;     Allergies: Amoxicillin  Medications: Prior to Admission medications   Medication Sig Start Date End Date Taking? Authorizing Provider  B Complex-C (B-COMPLEX WITH VITAMIN C) tablet Take 1 tablet by mouth daily.   Yes [provider]  cefadroxil (DURICEF) 500 MG capsule Take 2 capsules (1,000 mg) by mouth 2 (two) times daily for 7 days then starting on 9/16 take 1 capsule (55m) twice daily 07/04/22  Yes PFritzi Mandes MD  ferrous sulfate 325 (65 FE) MG tablet Take 1 tablet (325 mg total) by mouth daily with breakfast. 05/22/22  Yes Sreenath, Sudheer B, MD  folic acid (FOLVITE) 1 MG tablet Take 1 tablet (1 mg total) by mouth daily. 05/22/22  Yes Sreenath, Sudheer B, MD  furosemide (LASIX) 40 MG tablet Take 1 tablet (40 mg total) by mouth 2 (two) times daily. 07/03/22  Yes PFritzi Mandes MD  Multiple Vitamin (MULTIVITAMIN WITH MINERALS) TABS tablet Take 1 tablet by mouth daily.   Yes [provider]  spironolactone (ALDACTONE) 50 MG tablet Take 2 tablets (100 mg total) by mouth daily. 07/03/22  Yes PFritzi Mandes MD  thiamine (VITAMIN B1) 100 MG tablet Take 1 tablet (100 mg total) by mouth daily. 06/16/22 07/16/22 Yes ANolberto Hanlon MD     History reviewed. No pertinent family history.  Social History   Socioeconomic History   Marital status: Single    Spouse name: Not on file   Number of children: Not on file   Years of education: Not on file   Highest education level: Not on file  Occupational History   Not on file  Tobacco Use   Smoking status: Some Days  Types: Cigarettes   Smokeless tobacco: Never  Substance and Sexual Activity   Alcohol use: Yes   Drug use: Yes    Types: Marijuana, Methamphetamines, IV, Cocaine   Sexual activity: Not on file  Other Topics Concern   Not on file  Social History Narrative   Not on file   Social Determinants of Health   Financial Resource Strain: Not on file  Food Insecurity: Food Insecurity Present (07/09/2022)   Hunger Vital  Sign    Worried About Running Out of Food in the Last Year: Often true    Ran Out of Food in the Last Year: Often true  Transportation Needs: Unmet Transportation Needs (07/09/2022)   PRAPARE - Hydrologist (Medical): Yes    Lack of Transportation (Non-Medical): Yes  Physical Activity: Not on file  Stress: Not on file  Social Connections: Not on file    Review of Systems: A 12 point ROS discussed and pertinent positives are indicated in the HPI above.  All other systems are negative.  Review of Systems  Constitutional:  Positive for appetite change and fatigue.  Respiratory:  Negative for cough and shortness of breath.   Cardiovascular:  Positive for leg swelling.  Gastrointestinal:  Positive for abdominal pain.    Vital Signs: BP 130/80 (BP Location: Left Arm)   Pulse 92   Temp 98.3 F (36.8 C) (Oral)   Resp 18   Ht 6' 2"  (1.88 m)   Wt 264 lb 8.8 oz (120 kg)   SpO2 95%   BMI 33.97 kg/m   Physical Exam Constitutional:      General: He is not in acute distress.    Appearance: He is ill-appearing.  HENT:     Mouth/Throat:     Mouth: Mucous membranes are dry.     Pharynx: Oropharynx is clear.  Cardiovascular:     Rate and Rhythm: Normal rate and regular rhythm.     Pulses: Normal pulses.  Pulmonary:     Effort: Pulmonary effort is normal.  Abdominal:     General: There is distension.  Skin:    General: Skin is warm and dry.  Neurological:     Mental Status: He is alert and oriented to person, place, and time.     Imaging: US Paracentesis  Result Date: 07/13/2022 INDICATION: Patient with a history of cirrhosis with recurrent large volume ascites. Interventional radiology asked to perform a diagnostic and therapeutic paracentesis. EXAM: ULTRASOUND GUIDED PARACENTESIS MEDICATIONS: 1% lidocaine 10 mL COMPLICATIONS: None immediate. PROCEDURE: Informed written consent was obtained from the patient after a discussion of the risks, benefits  and alternatives to treatment. A timeout was performed prior to the initiation of the procedure. Initial ultrasound scanning demonstrates a large amount of ascites within the right lower abdominal quadrant. The right lower abdomen was prepped and draped in the usual sterile fashion. 1% lidocaine was used for local anesthesia. Following this, a 19 gauge, 7-cm, Yueh catheter was introduced. An ultrasound image was saved for documentation purposes. The paracentesis was performed. The catheter was removed and a dressing was applied. The patient tolerated the procedure well without immediate post procedural complication. Patient received post-procedure intravenous albumin; see nursing notes for details. FINDINGS: A total of approximately 7.6 L of clear yellow fluid was removed. Samples were sent to the laboratory as requested by the clinical team. IMPRESSION: Successful ultrasound-guided paracentesis yielding 7.6 liters of peritoneal fluid. Read by: Soyla Dryer, NP PLAN: The patient has previously been  formally evaluated by the [facility] Interventional Radiology Portal Hypertension Clinic and is being actively followed for potential future intervention. Electronically Signed   By: Ruthann Cancer M.D.   On: 07/13/2022 12:47   US Paracentesis  Result Date: 07/09/2022 INDICATION: Cirrhosis and ascites. EXAM: ULTRASOUND GUIDED  PARACENTESIS MEDICATIONS: None. COMPLICATIONS: None immediate. PROCEDURE: Informed written consent was obtained from the patient after a discussion of the risks, benefits and alternatives to treatment. A timeout was performed prior to the initiation of the procedure. Initial ultrasound scanning demonstrates a large amount of ascites within the right lower abdominal quadrant. The right lower abdomen was prepped and draped in the usual sterile fashion. 1% lidocaine was used for local anesthesia. Following this, a 19 gauge Yueh catheter was introduced. An ultrasound image was saved for  documentation purposes. The paracentesis was performed. The catheter was removed and a dressing was applied. The patient tolerated the procedure well without immediate post procedural complication. Procedure was performed by Tsosie Billing, PA-C. FINDINGS: A total of approximately 1.7 L of yellow fluid was removed. Samples were sent to the laboratory as requested by the clinical team. IMPRESSION: Successful ultrasound-guided paracentesis yielding 1.7 liters of peritoneal fluid. Electronically Signed   By: Markus Daft M.D.   On: 07/09/2022 21:03   [place] ASCITES (ABDOMEN LIMITED)  Result Date: 07/09/2022 CLINICAL DATA:  Cirrhosis; evaluate for ascites EXAM: LIMITED ABDOMEN ULTRASOUND FOR ASCITES TECHNIQUE: Limited ultrasound survey for ascites was performed in all four abdominal quadrants. COMPARISON:  Ultrasound 07/02/2022 FINDINGS: Moderate abdominopelvic ascites seen in all 4 quadrants. IMPRESSION: Moderate abdominopelvic ascites seen in all 4 quadrants. Electronically Signed   By: Placido Sou M.D.   On: 07/09/2022 03:55   DG Chest Port 1 View  Result Date: 07/09/2022 CLINICAL DATA:  Fever EXAM: PORTABLE CHEST 1 VIEW COMPARISON:  06/28/2022 FINDINGS: The heart size and mediastinal contours are within normal limits. Both lungs are clear. The visualized skeletal structures are unremarkable. IMPRESSION: No active disease. Electronically Signed   By: Ulyses Jarred M.D.   On: 07/09/2022 03:24   US Paracentesis  Result Date: 07/02/2022 INDICATION: Patient with history of cirrhosis and recurrent ascites currently being treated for SBP request received for repeat paracentesis to evaluate for improvement in cell count. [facility] interventional radiology portal hypertension evaluation completed 06/15/2022. Patient is being followed for potential future intervention. EXAM: ULTRASOUND GUIDED  PARACENTESIS MEDICATIONS: Local 1% lidocaine only. COMPLICATIONS: None immediate. PROCEDURE: Informed written consent was  obtained from the patient after a discussion of the risks, benefits and alternatives to treatment. A timeout was performed prior to the initiation of the procedure. Initial ultrasound scanning demonstrates a small amount of ascites within the left upper abdominal quadrant. The left upper abdomen was prepped and draped in the usual sterile fashion. 1% lidocaine was used for local anesthesia. Following this, a 19 gauge, 7-cm, Yueh catheter was introduced. An ultrasound image was saved for documentation purposes. The paracentesis was performed. The catheter was removed and a dressing was applied. The patient tolerated the procedure well without immediate post procedural complication. FINDINGS: A total of approximately 1.9 L of clear yellow fluid was removed. Samples were sent to the laboratory as requested by the clinical team. IMPRESSION: Successful ultrasound-guided paracentesis yielding 1.9 liters of peritoneal fluid. PLAN: The patient has previously been formally evaluated by the [facility] Interventional Radiology Portal Hypertension Clinic and is being actively followed for potential future intervention. This exam was performed by Tsosie Billing PA-C, and was supervised and interpreted by Dr. Kathlene Cote.  Electronically Signed   By: Aletta Edouard M.D.   On: 07/02/2022 15:11   US Paracentesis  Result Date: 07/01/2022 INDICATION: Patient with history of cirrhosis with recurrent ascites. [facility] interventional radiology portal hypertension evaluation completed 06/15/2022. Patient is being followed for potential future intervention. EXAM: ULTRASOUND GUIDED THERAPEUTIC LEFT LOWER QUADRANT PARACENTESIS MEDICATIONS: 10 mL 1 % lidocaine COMPLICATIONS: None immediate. PROCEDURE: Informed written consent was obtained from the patient after a discussion of the risks, benefits and alternatives to treatment. A timeout was performed prior to the initiation of the procedure. Initial ultrasound scanning demonstrates a  moderate amount of ascites within the left lower abdominal quadrant. The left lower abdomen was prepped and draped in the usual sterile fashion. 1% lidocaine was used for local anesthesia. Following this, a 19 gauge, 7-cm, Yueh catheter was introduced. An ultrasound image was saved for documentation purposes. The paracentesis was performed. The catheter was removed and a dressing was applied. The patient tolerated the procedure well without immediate post procedural complication. Patient received post-procedure intravenous albumin; see nursing notes for details. FINDINGS: A total of approximately 7.5 L of clear, yellow fluid was removed. IMPRESSION: Successful ultrasound-guided paracentesis yielding 7.5 liters of peritoneal fluid. PLAN: The patient has previously been formally evaluated by the [facility] Interventional Radiology Portal Hypertension Clinic and is being actively followed for potential future intervention. Read by: Narda Rutherford, AGNP-BC Electronically Signed   By: Albin Felling M.D.   On: 07/01/2022 07:08   US Paracentesis  Result Date: 06/29/2022 INDICATION: Patient with history of cirrhosis and recurrent ascites, [place] Interventional Radiology Portal Hypertension Clinic evaluation done 06/15/22 EXAM: ULTRASOUND GUIDED LEFT PARACENTESIS MEDICATIONS: None. COMPLICATIONS: None immediate. PROCEDURE: Informed written consent was obtained from the patient after a discussion of the risks, benefits and alternatives to treatment. A timeout was performed prior to the initiation of the procedure. Initial ultrasound scanning demonstrates a large amount of ascites within the right lower abdominal quadrant. The right lower abdomen was prepped and draped in the usual sterile fashion. 1% lidocaine was used for local anesthesia. Following this, a 19 gauge, 7-cm, Yueh catheter was introduced. An ultrasound image was saved for documentation purposes. The paracentesis was performed. The catheter was removed and a  dressing was applied. The patient tolerated the procedure well without immediate post procedural complication. FINDINGS: A total of approximately 7.0L of yellow ascitic fluid was removed. IMPRESSION: Successful ultrasound-guided paracentesis yielding 7.0L liters of peritoneal fluid. Procedure performed by; Pasty Spillers IR PA PLAN: The patient has previously been formally evaluated by the [place] [facility] Portal Hypertension [facility] and is being actively followed for potential future intervention. Michaelle Birks, MD [facility] Radiology Electronically Signed   By: Michaelle Birks M.D.   On: 06/29/2022 11:43   US Venous Img Lower Bilateral (DVT)  Result Date: 06/28/2022 CLINICAL DATA:  Lower extremity edema.  Ascites. EXAM: BILATERAL LOWER EXTREMITY VENOUS DOPPLER ULTRASOUND TECHNIQUE: Gray-scale sonography with compression, as well as color and duplex ultrasound, were performed to evaluate the deep venous system(s) from the level of the common femoral vein through the popliteal and proximal calf veins. COMPARISON:  None Available. FINDINGS: VENOUS Normal compressibility of the common femoral, superficial femoral, and popliteal veins, as well as the visualized calf veins. Visualized portions of profunda femoral vein and great saphenous vein unremarkable. No filling defects to suggest DVT on grayscale or color Doppler imaging. Doppler waveforms show normal direction of venous flow, normal respiratory plasticity and response to augmentation. OTHER Subcutaneous edema  noted in the lower extremities bilaterally. Limitations: Study mildly limited due to lower extremity edema and patient discomfort. IMPRESSION: Negative for DVT in either lower extremity. Electronically Signed   By: Misty Stanley M.D.   On: 06/28/2022 06:24   CT ABDOMEN PELVIS W CONTRAST  Result Date: 06/28/2022 CLINICAL DATA:  Abdominal pain, acute, nonlocalized. Alcoholic cirrhosis.  EXAM: CT ABDOMEN AND PELVIS WITH CONTRAST TECHNIQUE: Multidetector CT imaging of the abdomen and pelvis was performed using the standard protocol following bolus administration of intravenous contrast. RADIATION DOSE REDUCTION: This exam was performed according to the departmental dose-optimization program which includes automated exposure control, adjustment of the mA and/or kV according to patient size and/or use of iterative reconstruction technique. CONTRAST:  157m OMNIPAQUE IOHEXOL 300 MG/ML  SOLN COMPARISON:  None Available. FINDINGS: Lower chest: Mild bibasilar dependent atelectasis, right greater than left. Gastroesophageal varices noted adjacent to the distal esophagus. Cardiac size within normal limits. Gynecomastia noted Hepatobiliary: The liver is shrunken and nodular in keeping with changes of cirrhosis. A slightly hyperenhancing 2 cm nodule is seen within the subserosal left hepatic lobe at axial image # 16/3, not well characterized on this examination. This may represent a dysplastic or regenerative nodule, however, a focal hepatoma is not excluded. No intra or extrahepatic biliary ductal dilation. There is recanalization of the umbilical vein which ultimately communicates to the common femoral veins. Cholelithiasis noted without definite evidence of acute cholecystitis. Pancreas: 13 mm cystic lesion within the a head neck juncture of the pancreas is indeterminate, possibly representing a pancreatic cyst, cystic neoplasm, or dilated pancreatic side branch. The pancreas is otherwise unremarkable. Spleen: The spleen is markedly enlarged measuring up to 20 cm in greatest dimension. No intrasplenic lesions are seen Adrenals/Urinary Tract: The adrenal glands are unremarkable. The kidneys are normal. Foley catheter balloon seen within a decompressed bladder lumen. Stomach/Bowel: Small varices are seen within the gastric cardia. There is circumferential bowel wall thickening involving the gastric antrum,  duodenum, and proximal jejunum which is nonspecific, but can be seen in the setting of portal venous hypertension. Inflammatory conditions, however, such as infectious gastroenteritis could appear similarly, however. There is, additionally, circumferential bowel wall thickening involving the rectum and colon with mild pericolonic inflammatory stranding, best appreciated involving the mid sigmoid colon. While changes of portal colopathy can be seen these typically preferentially affect the right colon and the extent favors the presence of an underlying infectious or inflammatory proctocolitis. No evidence of obstruction. Large volume ascites. No free intraperitoneal gas. Vascular/Lymphatic: Main portal vein is patent. Evidence of portal venous hypertension with gastroesophageal varices and portosystemic collateralization via the umbilical vein to the common femoral veins is noted. Mild aortoiliac atherosclerotic calcification. No aortic aneurysm. No pathologic adenopathy within the abdomen and pelvis. Reproductive: Prostate is unremarkable. Other: Small fluid containing umbilical hernia. Moderate diffuse subcutaneous body wall edema noted. Musculoskeletal: No acute bone abnormality. No lytic or blastic bone lesion IMPRESSION: 1. Morphologic changes in keeping with cirrhosis and portal venous hypertension with marked splenomegaly, gastroesophageal varices, and portosystemic collateralization via the umbilical vein to the common femoral veins. Large volume ascites. 2. 2 cm hyperenhancing nodule within the subserosal left hepatic lobe, not well characterized on this examination. This may represent a dysplastic or regenerative nodule, however, a focal hepatoma is not excluded. Correlation with serum AFP level and/or dedicated contrast enhanced MRI examination is recommended for further evaluation. 3. Circumferential bowel wall thickening involving the gastric antrum, duodenum, proximal jejunum,, possibly representing  changes of portal venous hypertension  and resultant edema versus is infectious or inflammatory conditions such as infectious gastroenteritis. 4. Thickening involving the rectum and colon with mild pericolonic inflammatory stranding, best appreciated involving the mid sigmoid colon. While changes of portal colopathy can be seen in the setting of portal venous hypertension, these conditions typically preferentially affect the right colon and the extent favors the presence of an infectious or inflammatory proctocolitis. 5. 13 mm cystic lesion within the head neck juncture of the pancreas. This may represent a pancreatic cyst, cystic neoplasm, or dilated pancreatic side branch. This could be further assessed with MRI examination once the patient's acute issues have resolved. 6. Cholelithiasis. Electronically Signed   By: Fidela Salisbury M.D.   On: 06/28/2022 04:11   DG Chest Portable 1 View  Result Date: 06/28/2022 CLINICAL DATA:  Dyspnea EXAM: PORTABLE CHEST 1 VIEW COMPARISON:  06/01/2022 FINDINGS: Lungs volumes are small and there is progressive elevation of the right hemidiaphragm. No pneumothorax or pleural effusion. Cardiac size within normal limits. Pulmonary vascularity is normal. Osseous structures are age-appropriate. No acute bone abnormality. IMPRESSION: Pulmonary hypoinflation. Progressive elevation of the right hemidiaphragm. Electronically Signed   By: Fidela Salisbury M.D.   On: 06/28/2022 02:48   US Paracentesis  Result Date: 06/15/2022 INDICATION: Patient with history of cirrhosis and recurrent ascites request received for therapeutic paracentesis. EXAM: ULTRASOUND GUIDED RLQ PARACENTESIS MEDICATIONS: None. COMPLICATIONS: None immediate. PROCEDURE: Informed written consent was obtained from the patient after a discussion of the risks, benefits and alternatives to treatment. A timeout was performed prior to the initiation of the procedure. Initial ultrasound scanning demonstrates a large amount of  ascites within the right lower abdominal quadrant. The right lower abdomen was prepped and draped in the usual sterile fashion. 1% lidocaine was used for local anesthesia. Following this, a 19 gauge, 7-cm, Yueh catheter was introduced. An ultrasound image was saved for documentation purposes. The paracentesis was performed. The catheter was removed and a dressing was applied. The patient tolerated the procedure well without immediate post procedural complication. FINDINGS: A total of approximately 5.8L of ascitic fluid was removed. IMPRESSION: Successful ultrasound-guided paracentesis yielding 5.8L liters of peritoneal fluid. PLAN: The patient has required >/=2 paracenteses in a 30 day period and a formal evaluation by the [place] [facility] Portal Hypertension Clinic has been arranged. Electronically Signed   By: Miachel Roux M.D.   On: 06/15/2022 14:41    Labs:  CBC: Recent Labs    07/10/22 0512 07/11/22 0632 07/12/22 0645 07/13/22 0527  WBC 6.3 4.1 4.7 5.4  HGB 10.2* 10.0* 10.6* 10.5*  HCT 30.7* 30.8* 31.9* 31.2*  PLT 116* 110* 121* 119*    COAGS: Recent Labs    05/05/22 1632 05/20/22 2043 06/01/22 1153 06/28/22 0214 07/09/22 0125  INR 1.5* 1.7* 1.5* 1.7* 1.7*  APTT 38* 36 35  --   --     BMP: Recent Labs    07/10/22 0512 07/11/22 0632 07/12/22 0645 07/13/22 0527  NA 130* 132* 132* 133*  K 3.6 3.9 3.9 4.0  CL 102 100 102 102  CO2 24 27 24 24   GLUCOSE 123* 100* 111* 90  BUN 7 9 12 14   CALCIUM 7.2* 7.5* 7.8* 7.8*  CREATININE 0.54* 0.60* 0.40* 0.53*  GFRNONAA >60 >60 >60 >60    LIVER FUNCTION TESTS: Recent Labs    06/28/22 0748 06/29/22 0502 07/06/22 1418 07/09/22 0125  BILITOT 8.8* 5.4* 7.8* 5.2*  AST 57* 36 72* 47*  ALT 38 28 43 30  ALKPHOS 103  71 126 105  PROT 5.2* 4.6* 6.4* 5.6*  ALBUMIN 2.3* 2.1* 2.8* 2.2*    TUMOR MARKERS: No results for input(s): "AFPTM", "CEA", "CA199", "CHROMGRNA" in the last 8760 hours.  Assessment and  Plan:  Cirrhosis, portal hypertension, varices and recurrent ascites: Dr. Serafina Royals met with the patient at the bedside today to discuss the nature of cirrhosis and portal hypertension with resultant ascites and varices. The patient stated he has had issues with bleeding varices in the past. In reviewing the patient's current imaging (CT abdomen/pelvis 06/28/22) the patient has a large recanalized umbilical vein shunting blood around the liver. Dr. Serafina Royals discussed how a TIPS procedure with umbilical vein embolization may impact his current health status.   With a current MELD-NA score of 22, a TIPS procedure may be high risk. Dr. Serafina Royals discussed the risks, benefits and alternatives of a TIPS procedure with umbilical vein embolization. Time was also spent discussing post-procedure care and follow up including adhering to a strict medicine regimen to avoid complications such as encephalopathy.   Given the patient's current situation of being homeless and without reliable transportation, the patient admitted that post-procedure follow up and obtaining medications would be difficult.   To further assess the patient's candidacy for TIPS, A CT angiogram of the abdomen and pelvis is needed. The patient agreed to have this study done to see if his anatomy is amenable to TIPS creation. This study has been ordered. Once the results of this study are in IR will re-evaluate and discuss with the patient about moving forward with TIPS. The patient would require transfer to [facility] for this procedure.   IR will await the results of the CTA and will continue to follow.   Thank you for this interesting consult.  I greatly enjoyed meeting Marco Spears and look forward to participating in their care.  A copy of this report was sent to the requesting provider on this date.  Electronically Signed: Soyla Dryer, AGACNP-BC [phone] 07/13/2022, 3:58 PM   I spent a total of 20 Minutes    in face to face in clinical  consultation, greater than 50% of which was counseling/coordinating care for TIPS consult

## 2022-07-13 NOTE — Progress Notes (Signed)
PHARMACY CONSULT NOTE - FOLLOW UP  Pharmacy Consult for Electrolyte Monitoring and Replacement   Recent Labs: Potassium (mmol/L)  Date Value  07/13/2022 4.0   Magnesium (mg/dL)  Date Value  07/13/2022 1.7   Calcium (mg/dL)  Date Value  07/13/2022 7.8 (L)   Albumin (g/dL)  Date Value  07/09/2022 2.2 (L)   Phosphorus (mg/dL)  Date Value  07/13/2022 3.1   Sodium (mmol/L)  Date Value  07/13/2022 133 (L)   Corrected Ca 8.9  Assessment: 46 y.o. with a history of polysubstance use, decompensated cirrhosis, recurrent ascites and frequent paracentesis, encephalopathy, treated hepc, recurrent rt knee bursiits, ESBL ecoli bacteremia, then e.coli bacteremia and MRSA infection bura.   Goal of Therapy:  WNL  Plan:  Corrected calcium 9.2, Pkos, K ,and Mg today WNL. Sodium  remains slightly below goal but improved since admission No electrolyte replacement needed this Am Will follow up AM labs and replace as needed  Sharran Caratachea Rodriguez-Guzman PharmD, BCPS 07/13/2022 8:28 AM

## 2022-07-14 ENCOUNTER — Inpatient Hospital Stay: Payer: Medicaid Other

## 2022-07-14 DIAGNOSIS — A4902 Methicillin resistant Staphylococcus aureus infection, unspecified site: Secondary | ICD-10-CM

## 2022-07-14 DIAGNOSIS — F1910 Other psychoactive substance abuse, uncomplicated: Secondary | ICD-10-CM

## 2022-07-14 LAB — CBC
HCT: 31 % — ABNORMAL LOW (ref 39.0–52.0)
Hemoglobin: 10.3 g/dL — ABNORMAL LOW (ref 13.0–17.0)
MCH: 32.5 pg (ref 26.0–34.0)
MCHC: 33.2 g/dL (ref 30.0–36.0)
MCV: 97.8 fL (ref 80.0–100.0)
Platelets: 93 10*3/uL — ABNORMAL LOW (ref 150–400)
RBC: 3.17 MIL/uL — ABNORMAL LOW (ref 4.22–5.81)
RDW: 17.5 % — ABNORMAL HIGH (ref 11.5–15.5)
WBC: 6.2 10*3/uL (ref 4.0–10.5)
nRBC: 0 % (ref 0.0–0.2)

## 2022-07-14 LAB — BASIC METABOLIC PANEL
Anion gap: 5 (ref 5–15)
BUN: 13 mg/dL (ref 6–20)
CO2: 24 mmol/L (ref 22–32)
Calcium: 7.6 mg/dL — ABNORMAL LOW (ref 8.9–10.3)
Chloride: 101 mmol/L (ref 98–111)
Creatinine, Ser: 0.57 mg/dL — ABNORMAL LOW (ref 0.61–1.24)
GFR, Estimated: >60 mL/min (ref >60)
Glucose, Bld: 103 mg/dL — ABNORMAL HIGH (ref 70–99)
Potassium: 4.2 mmol/L (ref 3.5–5.1)
Sodium: 130 mmol/L — ABNORMAL LOW (ref 135–145)

## 2022-07-14 LAB — AEROBIC CULTURE W GRAM STAIN (SUPERFICIAL SPECIMEN)

## 2022-07-14 LAB — CULTURE, BLOOD (ROUTINE X 2)
Culture: NO GROWTH
Culture: NO GROWTH
Special Requests: ADEQUATE
Special Requests: ADEQUATE

## 2022-07-14 LAB — MAGNESIUM: Magnesium: 1.7 mg/dL (ref 1.7–2.4)

## 2022-07-14 MED ORDER — ACETAMINOPHEN 325 MG PO TABS
325.0000 mg | ORAL_TABLET | Freq: Once | ORAL | Status: AC
  Administered 2022-07-14: 325 mg via ORAL
  Filled 2022-07-14: qty 1

## 2022-07-14 MED ORDER — DOXYCYCLINE HYCLATE 100 MG PO TABS
100.0000 mg | ORAL_TABLET | Freq: Two times a day (BID) | ORAL | Status: DC
  Administered 2022-07-14 – 2022-07-16 (×4): 100 mg via ORAL
  Filled 2022-07-14 (×4): qty 1

## 2022-07-14 MED ORDER — IOHEXOL 350 MG/ML SOLN
100.0000 mL | Freq: Once | INTRAVENOUS | Status: AC | PRN
  Administered 2022-07-14: 100 mL via INTRAVENOUS

## 2022-07-14 NOTE — Progress Notes (Addendum)
  PROGRESS NOTE    Marco Spears  WJX:[id number] DOB: November 22, 1975 DOA: 07/09/2022 PCP: Pcp, No  [id number]/[id number]-AA  LOS: 4 days   Brief hospital course: No notes on file  Assessment & Plan: Marco Spears is a 46 y.o. with a history of polysubstance use, decompensated cirrhosis, recurrent ascites and frequent paracentesis, encephalopathy, treated hepC, recurrent rt knee bursitis, ESBL ecoli bacteremia, then e.coli bacteremia and MRSA infection bursa, who presented to ED on 9/14 with abdominal distension and pain.  Multiple hospitalizations since he got out of prison earlier this year. Social issues- no ID card, no insurance, homeless, polysubstance use.     Decompensated alcoholic cirrhosis with ascites Frequent paracentesis  --received paracentesis on 9/14, with only 1.7L removed.  Another paracentesis on 9/18 with 7.6L removed.  IV albumin 50g given. -- used to follow up at [facility] he does not have transport to go to the clinic. --currently under consideration for TIPS with Dr. Serafina Royals. Plan: --cont lasix and aldactone --CTA a/p w/wo today for evaluation of TIPS candidacy  SBP --was just treated for SBP <2 weeks ago --started on ceftriaxone on presentation --ID consulted Plan: --cont ceftriaxone --will have to be on chronic prophylaxis   Hx of prepatellar bursitis I&D x 2 (7/13, 7/27) with Dr. Mack Guise - the wound has not healed.  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  Pt denied abdominal pain today.  Pt has difficulty keeping fluid restriction.  Reported good urine output.  Went for CTA a/p w/wo today for evaluation of TIPS candidacy.     Objective: Vitals:   07/13/22 2019 07/14/22 0509 07/14/22 0738 07/14/22 1621  BP: 107/65 115/65 (!) 104/54 (!) 96/56  Pulse: 92 (!) 113 (!) 105 92  Resp: 20 20 16    Temp: 98.2 F (36.8 C) (!) 100.8 F (38.2 C) 99.8 F (37.7 C) 98.3 F (36.8 C)  TempSrc: Oral Oral Oral   SpO2: 99% 95% 97% 100%  Weight:      Height:        Intake/Output Summary (Last 24 hours) at 07/14/2022 1809 Last data filed at 07/14/2022 1244 Gross per 24 hour  Intake 360 ml  Output 3700 ml  Net -3340 ml   Filed Weights   07/09/22 1100  Weight: 120 kg    Examination:   Constitutional: NAD, AAOx3 HEENT: conjunctivae and lids normal, EOMI CV: No cyanosis.   RESP: normal respiratory effort, on RA GI: large abdomen Extremities: edema in BLE SKIN: warm, dry Neuro: II - XII grossly intact.   Psych: depressed mood and affect.  Appropriate judgement and reason   Data Reviewed: I have personally reviewed labs and imaging studies  Time spent: 25 minutes  Enzo Bi, MD [facility] If 7PM-7AM, please contact night-coverage 07/14/2022, 6:09 PM

## 2022-07-14 NOTE — Plan of Care (Signed)

## 2022-07-14 NOTE — Progress Notes (Signed)
ID Multiple hospitalization since he got out of prison earlier this year. Social issues- no ID card, no insurance ,homeless, polysubstance use   04/20/22-04/22/22- hepatic encephalopathy Hypokalemia, pancytopenia, alcoholic cirrhosis   9/51/88-02/09/59   Cellulitis rt leg- infected prepatellar bursitis Group A strep in fluid and blood   05/21/22-05/26/22 septic bursitis - had I/D MRSA in culture sent on linezolid     06/01/22-06/15/22 ESBL e.coli bacteremia and culture from rt bursa had pseudomonas, acinetobacter and kleb- got meropnem for 2 weeks   06/28/22-07/03/22 E.coli bacteremia and sepsis ( not ESBL) Recurrent SBP Decompensated cirrhosis Pt says he is very tired Patient Vitals for the past 24 hrs:  BP Temp Temp src Pulse Resp SpO2  07/14/22 1621 (!) 96/56 98.3 F (36.8 C) -- 92 -- 100 %  07/14/22 0738 (!) 104/54 99.8 F (37.7 C) Oral (!) 105 16 97 %  07/14/22 0509 115/65 (!) 100.8 F (38.2 C) Oral (!) 113 20 95 %  07/13/22 2019 107/65 98.2 F (36.8 C) Oral 92 20 99 %   Chest b/l air entry Hss1s2 Abd soft- some tenderness Rt knee- wound CNS non focal  Labs    Latest Ref Rng & Units 07/14/2022    6:32 AM 07/13/2022    5:27 AM 07/12/2022    6:45 AM  CBC  WBC 4.0 - 10.5 K/uL 6.2  5.4  4.7   Hemoglobin 13.0 - 17.0 g/dL 10.3  10.5  10.6   Hematocrit 39.0 - 52.0 % 31.0  31.2  31.9   Platelets 150 - 400 K/uL 93  119  121        Latest Ref Rng & Units 07/14/2022    6:32 AM 07/13/2022    5:27 AM 07/12/2022    6:45 AM  CMP  Glucose 70 - 99 mg/dL 103  90  111   BUN 6 - 20 mg/dL 13  14  12    Creatinine 0.61 - 1.24 mg/dL 0.57  0.53  0.40   Sodium 135 - 145 mmol/L 130  133  132   Potassium 3.5 - 5.1 mmol/L 4.2  4.0  3.9   Chloride 98 - 111 mmol/L 101  102  102   CO2 22 - 32 mmol/L 24  24  24    Calcium 8.9 - 10.3 mg/dL 7.6  7.8  7.8      Micro Rt knee wound MRSA Peritoneal fluid culture- NG  Impression/recommendation Decompensated cirrhosis with ascites Needing multiple  paracentesis Need to consider TIPS but many social issues that interferes with adherence to care HE used to follow up at [facility] he does not have transport to go to the clinic.   SBP- on ceftriaxone- culture negative  can DC IV   Rt knee pre patellar bursitis- s/p multiple debridement- the wound has not healed- MRSA- will startt Doxy   Anemia thrombocytopenia   H/o ESBL bacteremia   H/o ECOLi bacteremia   H/o multiple infection Polysubstance use   Social issues including homelessness, no ID , no insurance   Recurrent admissions    Discussed the management with patient and care team

## 2022-07-14 NOTE — TOC Progression Note (Signed)
Transition of Care [facility]) - Progression Note    Patient Details  Name: Marco Spears MRN: [id number] Date of Birth: [birth year]  Transition of Care [facility]) CM/SW [place], LCSW Phone Number: 07/14/2022, 8:39 AM  Clinical Narrative:  CSW acknowledges consult about patient not having a PCP. TOC addressed this in last note on 9/15 and in notes from last admission.  Expected Discharge Plan: [place] (homeless lives in woods) Barriers to Discharge: Continued Medical Work up  Expected Discharge Plan and Services Expected Discharge Plan: [place] (homeless lives in woods)       Living arrangements for the past 2 months: No permanent address                                       Social Determinants of Health (SDOH) Interventions    Readmission Risk Interventions    05/06/2022    2:26 PM  Readmission Risk Prevention Plan  Transportation Screening Complete  Medication Review [employer] photographer) Complete

## 2022-07-14 NOTE — Progress Notes (Signed)
       CROSS COVER NOTE  NAME: Marco Spears MRN: [id number] DOB : 03-12-1976    Date of Service   07/14/2022   HPI/Events of Note   Notified of elevated temp of 38.2C this morning. Marco Spears is currently being treated for SBP. Staph Aureus found in wound and currently pending susceptibility.  Interventions   Assessment/Plan: Acetaminophen 325mg  x1     This document was prepared using Dragon voice recognition software and may include unintentional dictation errors.  Neomia Glass DNP, MHA, FNP-BC Nurse Practitioner [facility] Pager [phone]

## 2022-07-14 NOTE — Progress Notes (Signed)
PHARMACY CONSULT NOTE - FOLLOW UP  Pharmacy Consult for Electrolyte Monitoring and Replacement   Recent Labs: Potassium (mmol/L)  Date Value  07/13/2022 4.0   Magnesium (mg/dL)  Date Value  07/13/2022 1.7   Calcium (mg/dL)  Date Value  07/13/2022 7.8 (L)   Albumin (g/dL)  Date Value  07/09/2022 2.2 (L)   Phosphorus (mg/dL)  Date Value  07/13/2022 3.1   Sodium (mmol/L)  Date Value  07/13/2022 133 (L)   Corrected Ca 9.2  Assessment: 46 yo M with PMH polysubstance use, decompensated cirrhosis, recurrent ascites and frequent paracentesis, encephalopathy, treated HCV, recurrent rt knee bursiits, ESBL ecoli bacteremia, then e.coli bacteremia and MRSA infection bura. Presented to ED on 9/14 with abdominal distension and pain 2/2 decompensated alcoholic cirrhosis with ascites. Paracentesis removed 7.6L of fluid yesterday. Receiving spironolactone 100 mg PO daily and furosemide 40 mg PO BID.  Re: electrolytes: Phos, K, and Mg WNL. Corrected calcium 9.2. Sodium remains slightly below goal at 133 but improving  Goal of Therapy:  Electrolytes WNL  Plan:  No electrolyte replacement needed this morning Continue to monitor electrolytes daily with AM labs and replace as needed  Dara Hoyer, PharmD PGY-1 Pharmacy Resident 07/14/2022 7:16 AM

## 2022-07-14 NOTE — Plan of Care (Signed)

## 2022-07-15 ENCOUNTER — Inpatient Hospital Stay: Payer: Medicaid Other

## 2022-07-15 DIAGNOSIS — F1910 Other psychoactive substance abuse, uncomplicated: Secondary | ICD-10-CM

## 2022-07-15 LAB — CBC
HCT: 30.8 % — ABNORMAL LOW (ref 39.0–52.0)
Hemoglobin: 10.1 g/dL — ABNORMAL LOW (ref 13.0–17.0)
MCH: 32 pg (ref 26.0–34.0)
MCHC: 32.8 g/dL (ref 30.0–36.0)
MCV: 97.5 fL (ref 80.0–100.0)
Platelets: 91 10*3/uL — ABNORMAL LOW (ref 150–400)
RBC: 3.16 MIL/uL — ABNORMAL LOW (ref 4.22–5.81)
RDW: 18.1 % — ABNORMAL HIGH (ref 11.5–15.5)
WBC: 4.9 10*3/uL (ref 4.0–10.5)
nRBC: 0 % (ref 0.0–0.2)

## 2022-07-15 LAB — BASIC METABOLIC PANEL
Anion gap: 3 — ABNORMAL LOW (ref 5–15)
BUN: 11 mg/dL (ref 6–20)
CO2: 24 mmol/L (ref 22–32)
Calcium: 7.6 mg/dL — ABNORMAL LOW (ref 8.9–10.3)
Chloride: 105 mmol/L (ref 98–111)
Creatinine, Ser: 0.51 mg/dL — ABNORMAL LOW (ref 0.61–1.24)
GFR, Estimated: >60 mL/min (ref >60)
Glucose, Bld: 113 mg/dL — ABNORMAL HIGH (ref 70–99)
Potassium: 3.9 mmol/L (ref 3.5–5.1)
Sodium: 132 mmol/L — ABNORMAL LOW (ref 135–145)

## 2022-07-15 LAB — PHOSPHORUS: Phosphorus: 2.9 mg/dL (ref 2.5–4.6)

## 2022-07-15 LAB — MAGNESIUM: Magnesium: 1.8 mg/dL (ref 1.7–2.4)

## 2022-07-15 NOTE — Procedures (Signed)
PROCEDURE SUMMARY:  Successful ultrasound guided paracentesis from the right lower quadrant.  Yielded 6.7 L of clear yellow fluid.  No immediate complications.  The patient tolerated the procedure well.   Specimen not sent for labs.  EBL < 2 mL  The patient has previously been evaluated by the [facility], and deemed not a candidate for intervention.   Soyla Dryer, [place] [phone] 07/15/2022, 4:53 PM

## 2022-07-15 NOTE — Progress Notes (Signed)
[place] [facility] [place] NAME: Marco Spears    MR#:  [id number]  DATE OF BIRTH:  [birth year]  SUBJECTIVE:  complains of feeling tired. Patient tells me did not sleep last night because he was talking to the staff members here. Asking me if radiology is going to do the procedure.   VITALS:  Blood pressure 113/71, pulse 91, temperature 98.7 F (37.1 C), temperature source Oral, resp. rate 20, height 6\' 2"  (1.88 m), weight 120 kg, SpO2 95 %.  PHYSICAL EXAMINATION:   GENERAL:  46 y.o.-year-old patient lying in the bed with no acute distress. Chronically ill LUNGS: Normal breath sounds bilaterally, no wheezing, rales, rhonchi.  CARDIOVASCULAR: S1, S2 normal. No murmurs, rubs, or gallops.  ABDOMEN: Soft, nontender, chronically distended. Bowel sounds present.  EXTREMITIES: ++ edema b/l.    NEUROLOGIC: nonfocal  patient is alert and awake SKIN: No obvious rash, lesion, or ulcer.   LABORATORY PANEL:  CBC Recent Labs  Lab 07/15/22 0611  WBC 4.9  HGB 10.1*  HCT 30.8*  PLT 91*    Chemistries  Recent Labs  Lab 07/09/22 0125 07/10/22 0512 07/15/22 0611  NA 128*   < > 132*  K 2.9*   < > 3.9  CL 97*   < > 105  CO2 24   < > 24  GLUCOSE 173*   < > 113*  BUN 10   < > 11  CREATININE 0.67   < > 0.51*  CALCIUM 7.3*   < > 7.6*  MG 1.6*   < > 1.8  AST 47*  --   --   ALT 30  --   --   ALKPHOS 105  --   --   BILITOT 5.2*  --   --    < > = values in this interval not displayed.   Cardiac Enzymes No results for input(s): "TROPONINI" in the last 168 hours. RADIOLOGY:  CT Angio Abd/Pel w/ and/or w/o  Result Date: 07/14/2022 CLINICAL DATA:  46 yo M with PMH polysubstance use, decompensated cirrhosis, recurrent ascites and frequent paracentesis, encephalopathy, treated HCV, recurrent rt knee bursitis, ESBL ecoli bacteremia, then e. coli bacteremia and MRSA infection. Presented to ED on 9/14 with abdominal distension and pain 2/2 decompensated  alcoholic cirrhosis with ascites. Paracentesis removed 7.6 L of fluid yesterday. Receiving spironolactone 100 mg PO daily and furosemide 40 mg PO BID. EXAM: CTA ABDOMEN AND PELVIS WITHOUT AND WITH CONTRAST TECHNIQUE: Multidetector CT imaging of the abdomen and pelvis was performed using the standard protocol during bolus administration of intravenous contrast. Multiplanar reconstructed images and MIPs were obtained and reviewed to evaluate the vascular anatomy. RADIATION DOSE REDUCTION: This exam was performed according to the departmental dose-optimization program which includes automated exposure control, adjustment of the mA and/or kV according to patient size and/or use of iterative reconstruction technique. CONTRAST:  162mL OMNIPAQUE IOHEXOL 350 MG/ML SOLN COMPARISON:  06/28/2022 FINDINGS: VASCULAR Aorta: Normal caliber aorta without aneurysm, dissection, vasculitis or significant stenosis. Celiac: No significant abnormality of the celiac artery. Left gastric artery originates directly from the aorta. Left hepatic artery is replaced from the left gastric. SMA: Patent without evidence of aneurysm, dissection, vasculitis or significant stenosis. Renals: Both renal arteries are patent without evidence of aneurysm, dissection, vasculitis, fibromuscular dysplasia or significant stenosis. IMA: Patent without evidence of aneurysm, dissection, vasculitis or significant stenosis. Inflow: Mild calcified plaque of the common iliac arteries bilaterally without flow-limiting stenosis. Proximal Outflow: Bilateral common  femoral and visualized portions of the superficial and profunda femoral arteries are patent without evidence of aneurysm, dissection, vasculitis or significant stenosis. Veins: Inferior vena cava is patent. Portal vein is patent. The left main portal vein is asymmetrically dilated with large is recanalized umbilical vein which communicates between the left main portal vein and bilateral common femoral veins.  Prominent left inferior epigastric veins also seen communicating with the recanalized umbilical vein. 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 Review of the MIP images confirms the above findings. NON-VASCULAR Lower chest: Elevated right hemidiaphragm with mild right basilar atelectasis. Hepatobiliary: Shrunken nodular liver consistent with cirrhosis. Recently identified enhancing nodule in the lateral tip of the left hepatic lobe is less apparent on the current examination. Cholelithiasis. No bile duct dilatation. Pancreas: 1.3 cm cystic lesion again seen at the head and neck junction. No pancreatic ductal dilatation. Spleen: Unchanged splenomegaly with the spleen measuring 20 cm in the craniocaudal dimension. Adrenals/Urinary Tract: Subcentimeter left renal hypodensity is too small to fully characterize. Adrenal glands, kidneys, ureters, and bladder otherwise normal. Stomach/Bowel: Diffuse thickening of the wall of the colon from the cecum to mid transverse has progressed since prior examination. No bowel dilatation to indicate ileus or obstruction. The appendix is not well visualized. Lymphatic: No enlarged abdominal or pelvic lymph nodes. Reproductive: Prostate is unremarkable. Other: Large volume abdominal ascites. Small amount of ascites present within small umbilical hernia. Musculoskeletal: Moderate bilateral gynecomastia. No acute osseous abnormality. IMPRESSION: VASCULAR 1. Patent main and left portal veins. The right main portal vein is asymmetrically smaller but appears patent. 2. Large recanalized umbilical vein communicating between the left portal and bilateral common femoral veins. 3. Gastroesophageal varices. NON-VASCULAR 1. Cirrhotic liver morphology. 2. Interval worsening of diffuse wall thickening from cecum to mid transverse colon suspicious for progressive colitis. These findings can also be the result of liver failure and portal hypertension. 3. 2.0 cm left liver mass seen on recent CT of the  abdomen pelvis from 06/28/2022 is not as well well visualized on the current examination. 1.3 cm cystic lesion of the pancreatic head and neck junction is again seen. Further evaluation with contrast enhanced MRI should be performed to evaluate both of these findings. 4. Large volume abdominal ascites. 5. Unchanged splenomegaly. Electronically Signed   By: Miachel Roux M.D.   On: 07/14/2022 13:12    Assessment and Plan  BHUVAN VILLAGRAN is a 46 y.o. with a history of polysubstance use, decompensated cirrhosis, recurrent ascites and frequent paracentesis, encephalopathy, treated hepC, recurrent rt knee bursitis, ESBL ecoli bacteremia, then e.coli bacteremia and MRSA infection bursa, who presented to ED on 9/14 with abdominal distension and pain.   Multiple hospitalizations since he got out of prison earlier this year. Social issues- no ID card, no insurance, homeless, polysubstance use.     Decompensated alcoholic cirrhosis with ascites Frequent paracentesis  --received paracentesis on 9/14, with only 1.7L removed.  Another paracentesis on 9/18 with 7.6L removed.  IV albumin 50g given. -- used to follow up at [facility] he does not have transport to go to the clinic. --currently under consideration for TIPS with Dr. Serafina Royals-- discussed with radiology and patient is not a candidate for the procedure. Patient was informed often. --cont lasix and aldactone-- patient IUDs in well -- will order another ultrasound paracentesis  SBP --was just treated for SBP <2 weeks ago --will have to be on chronic prophylaxis --on po doxycycline   Hx of prepatellar bursitis I&D x 2 (7/13, 7/27) with Dr. Mack Guise - the  wound has not healed.  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 He has no friends or family that can help him get his ID card provide shelter at home. He gets discharge on the streets and comes in repeatedly with similar presentation.  Patient has a birth certificate. I discussed with him to see if he can ask any of his friends to take him to get his ID in order to help for future discharge planning.   Procedures:paracentesis    TOTAL TIME TAKING CARE OF THIS PATIENT: 30 minutes.  >50% time spent on counselling and coordination of care  Note: This dictation was prepared with Dragon dictation along with smaller phrase technology. Any transcriptional errors that result from this process are unintentional.  Fritzi Mandes M.D    [facility]   CC: Primary care physician; Pcp, No

## 2022-07-15 NOTE — Plan of Care (Signed)

## 2022-07-15 NOTE — Progress Notes (Signed)
PHARMACY CONSULT NOTE - FOLLOW UP  Pharmacy Consult for Electrolyte Monitoring and Replacement   Recent Labs: Potassium (mmol/L)  Date Value  07/15/2022 3.9   Magnesium (mg/dL)  Date Value  07/15/2022 1.8   Calcium (mg/dL)  Date Value  07/15/2022 7.6 (L)   Albumin (g/dL)  Date Value  07/09/2022 2.2 (L)   Phosphorus (mg/dL)  Date Value  07/15/2022 2.9   Sodium (mmol/L)  Date Value  07/15/2022 132 (L)   Corrected Ca 9.2  Assessment: 46 yo M with PMH polysubstance use, decompensated cirrhosis, recurrent ascites and frequent paracentesis, encephalopathy, treated HCV, recurrent rt knee bursiits, ESBL ecoli bacteremia, then e.coli bacteremia and MRSA infection bura. Presented to ED on 9/14 with abdominal distension and pain 2/2 decompensated alcoholic cirrhosis with ascites. Receiving spironolactone 100 mg PO daily and furosemide 40 mg PO BID.  Goal of Therapy:  Electrolytes WNL  Plan:  No electrolyte replacement needed this morning Continue to monitor electrolytes daily with AM labs and replace as needed  Eleonore Chiquito, PharmD, BCPS 07/15/2022 7:42 AM

## 2022-07-16 ENCOUNTER — Other Ambulatory Visit: Payer: Self-pay | Admitting: Internal Medicine

## 2022-07-16 ENCOUNTER — Other Ambulatory Visit: Payer: Self-pay

## 2022-07-16 DIAGNOSIS — R180 Malignant ascites: Secondary | ICD-10-CM

## 2022-07-16 MED ORDER — DOXYCYCLINE HYCLATE 100 MG PO TABS
100.0000 mg | ORAL_TABLET | Freq: Two times a day (BID) | ORAL | 0 refills | Status: AC
  Filled 2022-07-16: qty 8, 4d supply, fill #0

## 2022-07-16 NOTE — Progress Notes (Signed)
PHARMACY CONSULT NOTE - FOLLOW UP  Pharmacy Consult for Electrolyte Monitoring and Replacement   Recent Labs: Potassium (mmol/L)  Date Value  07/15/2022 3.9   Magnesium (mg/dL)  Date Value  07/15/2022 1.8   Calcium (mg/dL)  Date Value  07/15/2022 7.6 (L)   Albumin (g/dL)  Date Value  07/09/2022 2.2 (L)   Phosphorus (mg/dL)  Date Value  07/15/2022 2.9   Sodium (mmol/L)  Date Value  07/15/2022 132 (L)   Corrected Ca 9.2  Assessment: 46 yo M with PMH polysubstance use, decompensated cirrhosis, recurrent ascites and frequent paracentesis, encephalopathy, treated HCV, recurrent rt knee bursiits, ESBL ecoli bacteremia, then e.coli bacteremia and MRSA infection bura. Presented to ED on 9/14 with abdominal distension and pain 2/2 decompensated alcoholic cirrhosis with ascites. Receiving spironolactone 100 mg PO daily and furosemide 40 mg PO BID.  Goal of Therapy:  Electrolytes WNL  Plan:  No new labs. No electrolyte replacement needed this morning Continue to monitor electrolytes daily with AM labs and replace as needed  Eleonore Chiquito, PharmD, BCPS 07/16/2022 8:13 AM

## 2022-07-16 NOTE — Plan of Care (Signed)

## 2022-07-16 NOTE — Progress Notes (Signed)
Discharge instructions were reviewed with pt. Questions were encourage and answered. IV was taken out. We are waiting on prescriptions to be deliver to the patient.

## 2022-07-16 NOTE — Discharge Summary (Signed)
Physician Discharge Summary   Patient: Marco Spears MRN: [id number] DOB: 03/23/1976  Admit date:     07/09/2022  Discharge date: 07/16/22  Discharge Physician: Enedina Finner   PCP: Pcp, No   Recommendations at discharge:    Try to get  your valid ID with the documents you have  Discharge Diagnoses: Recurrent Large volume ascites with frequent Paracentesis End stage Cirrhosis of Liver,alcoholic  Hospital Course: Marco Spears is a 46 y.o. with a history of polysubstance use, decompensated cirrhosis, recurrent ascites and frequent paracentesis, encephalopathy, treated hepC, recurrent rt knee bursitis, ESBL ecoli bacteremia, then e.coli bacteremia and MRSA infection bursa, who presented to ED on 9/14 with abdominal distension and pain.   Multiple hospitalizations since he got out of prison earlier this year. Social issues- no ID card, no insurance, homeless, polysubstance use.     Decompensated alcoholic cirrhosis with ascites Frequent paracentesis  --received paracentesis on 9/14, with only 1.7L removed.  Another paracentesis on 9/18 with 7.6L removed.  -- used to follow up at [facility] he does not have transport to go to the clinic. --currently under consideration for TIPS with Dr. Elby Showers-- discussed with radiology and patient is not a candidate for the procedure. Patient was informed often. --cont lasix and aldactone-- patient IUDs in well -- 9/20 ultrasound paracentesis--6.0 liters removed --d/w [place] to get routine weekly paracentesis scheduled--able to enter the order. Next paracentesis 06/30/2022   SBP --was just treated for SBP <2 weeks ago --will have to be on chronic prophylaxis --Fluid culture negative   Hx of prepatellar bursitis I&D x 2 (7/13, 7/27) with Dr. Martha Clan - the wound has not healed.  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    Hypokalemia Hypomag/Hypophos --monitor and replete PRN   Hyponatremia --2/2 cirrhosis    Pt is at a very HIGH risk for readmission given  DVT prophylaxis: Lovenox SQ Code Status: Full code  Family Communication:  Level of care: Med-Surg Dispo:   The patient is from: homeless Anticipated d/c is to: homeless-        Procedures performed: paracentesis x 3  Disposition:  Homeless Diet recommendation:  Discharge Diet Orders (From admission, onward)     Start     Ordered   07/16/22 0000  Diet - low sodium heart healthy        07/16/22 1025           Cardiac diet DISCHARGE MEDICATION: Allergies as of 07/16/2022       Reactions   Amoxicillin Rash   TOLERATED CEFAZOLIN PRIOR States it makes his skin turn red        Medication List     STOP taking these medications    cefadroxil 500 MG capsule Commonly known as: DURICEF       TAKE these medications    B-complex with vitamin C tablet Take 1 tablet by mouth daily.   doxycycline 100 MG tablet Commonly known as: VIBRA-TABS Take 1 tablet (100 mg total) by mouth every 12 (twelve) hours for 4 days.   FeroSul 325 (65 FE) MG tablet Generic drug: ferrous sulfate Take 1 tablet (325 mg total) by mouth daily with breakfast.   folic acid 1 MG tablet Commonly known as: FOLVITE Take 1 tablet (1  mg total) by mouth daily.   furosemide 40 MG tablet Commonly known as: LASIX Take 1 tablet (40 mg total) by mouth 2 (two) times daily.   multivitamin with minerals Tabs tablet Take 1 tablet by mouth daily.   spironolactone 50 MG tablet Commonly known as: ALDACTONE Take 2 tablets (100 mg total) by mouth daily.   thiamine 100 MG tablet Commonly known as: VITAMIN B1 Take 1 tablet (100 mg total) by mouth daily.               Discharge Care Instructions  (From admission, onward)            Start     Ordered   07/16/22 0000  Discharge wound care:       Comments: Change the bandage on right knee as needed   07/16/22 1025            Discharge Exam: Filed Weights   07/09/22 1100  Weight: 120 kg     Condition at discharge: poor  The results of significant diagnostics from this hospitalization (including imaging, microbiology, ancillary and laboratory) are listed below for reference.   Imaging Studies: US Paracentesis  Result Date: 07/16/2022 INDICATION: Patient with a history of cirrhosis and recurrent large volume ascites. Interventional radiology asked to perform a therapeutic paracentesis. EXAM: ULTRASOUND GUIDED PARACENTESIS MEDICATIONS: 1% lidocaine 10 mL COMPLICATIONS: None immediate. PROCEDURE: Informed written consent was obtained from the patient after a discussion of the risks, benefits and alternatives to treatment. A timeout was performed prior to the initiation of the procedure. Initial ultrasound scanning demonstrates a large amount of ascites within the right lower abdominal quadrant. The right lower abdomen was prepped and draped in the usual sterile fashion. 1% lidocaine was used for local anesthesia. Following this, a 19 gauge, 7-cm, Yueh catheter was introduced. An ultrasound image was saved for documentation purposes. The paracentesis was performed. The catheter was removed and a dressing was applied. The patient tolerated the procedure well without immediate post procedural complication. FINDINGS: A total of approximately 6.7 L of clear yellow fluid was removed. IMPRESSION: Successful ultrasound-guided paracentesis yielding 6.7 liters of peritoneal fluid. Read by: Alwyn Ren, NP PLAN: The patient has previously been evaluated by the [facility] Interventional Radiology Portal Hypertension Clinic, and deemed not a candidate for intervention. Electronically Signed   By: Olive Bass M.D.   On: 07/16/2022 07:48   CT Angio Abd/Pel w/ and/or  w/o  Result Date: 07/14/2022 CLINICAL DATA:  46 yo M with PMH polysubstance use, decompensated cirrhosis, recurrent ascites and frequent paracentesis, encephalopathy, treated HCV, recurrent rt knee bursitis, ESBL ecoli bacteremia, then e. coli bacteremia and MRSA infection. Presented to ED on 9/14 with abdominal distension and pain 2/2 decompensated alcoholic cirrhosis with ascites. Paracentesis removed 7.6 L of fluid yesterday. Receiving spironolactone 100 mg PO daily and furosemide 40 mg PO BID. EXAM: CTA ABDOMEN AND PELVIS WITHOUT AND WITH CONTRAST TECHNIQUE: Multidetector CT imaging of the abdomen and pelvis was performed using the standard protocol during bolus administration of intravenous contrast. Multiplanar reconstructed images and MIPs were obtained and reviewed to evaluate the vascular anatomy. RADIATION DOSE REDUCTION: This exam was performed according to the departmental dose-optimization program which includes automated exposure control, adjustment of the mA and/or kV according to patient size and/or use of iterative reconstruction technique. CONTRAST:  OMNIPAQUE IOHEXOL 350 MG/ML SOLN COMPARISON:  06/28/2022 FINDINGS: VASCULAR Aorta: Normal caliber aorta without aneurysm, dissection, vasculitis or significant stenosis. Celiac: No significant abnormality of the celiac  artery. Left gastric artery originates directly from the aorta. Left hepatic artery is replaced from the left gastric. SMA: Patent without evidence of aneurysm, dissection, vasculitis or significant stenosis. Renals: Both renal arteries are patent without evidence of aneurysm, dissection, vasculitis, fibromuscular dysplasia or significant stenosis. IMA: Patent without evidence of aneurysm, dissection, vasculitis or significant stenosis. Inflow: Mild calcified plaque of the common iliac arteries bilaterally without flow-limiting stenosis. Proximal Outflow: Bilateral common femoral and visualized portions of the superficial and  profunda femoral arteries are patent without evidence of aneurysm, dissection, vasculitis or significant stenosis. Veins: Inferior vena cava is patent. Portal vein is patent. The left main portal vein is asymmetrically dilated with large is recanalized umbilical vein which communicates between the left main portal vein and bilateral common femoral veins. Prominent left inferior epigastric veins also seen communicating with the recanalized umbilical vein. 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 Review of the MIP images confirms the above findings. NON-VASCULAR Lower chest: Elevated right hemidiaphragm with mild right basilar atelectasis. Hepatobiliary: Shrunken nodular liver consistent with cirrhosis. Recently identified enhancing nodule in the lateral tip of the left hepatic lobe is less apparent on the current examination. Cholelithiasis. No bile duct dilatation. Pancreas: 1.3 cm cystic lesion again seen at the head and neck junction. No pancreatic ductal dilatation. Spleen: Unchanged splenomegaly with the spleen measuring 20 cm in the craniocaudal dimension. Adrenals/Urinary Tract: Subcentimeter left renal hypodensity is too small to fully characterize. Adrenal glands, kidneys, ureters, and bladder otherwise normal. Stomach/Bowel: Diffuse thickening of the wall of the colon from the cecum to mid transverse has progressed since prior examination. No bowel dilatation to indicate ileus or obstruction. The appendix is not well visualized. Lymphatic: No enlarged abdominal or pelvic lymph nodes. Reproductive: Prostate is unremarkable. Other: Large volume abdominal ascites. Small amount of ascites present within small umbilical hernia. Musculoskeletal: Moderate bilateral gynecomastia. No acute osseous abnormality. IMPRESSION: VASCULAR 1. Patent main and left portal veins. The right main portal vein is asymmetrically smaller but appears patent. 2. Large recanalized umbilical vein communicating between the left portal and  bilateral common femoral veins. 3. Gastroesophageal varices. NON-VASCULAR 1. Cirrhotic liver morphology. 2. Interval worsening of diffuse wall thickening from cecum to mid transverse colon suspicious for progressive colitis. These findings can also be the result of liver failure and portal hypertension. 3. 2.0 cm left liver mass seen on recent CT of the abdomen pelvis from 06/28/2022 is not as well well visualized on the current examination. 1.3 cm cystic lesion of the pancreatic head and neck junction is again seen. Further evaluation with contrast enhanced MRI should be performed to evaluate both of these findings. 4. Large volume abdominal ascites. 5. Unchanged splenomegaly. Electronically Signed   By: Acquanetta Belling M.D.   On: 07/14/2022 13:12   US Paracentesis  Result Date: 07/13/2022 INDICATION: Patient with a history of cirrhosis with recurrent large volume ascites. Interventional radiology asked to perform a diagnostic and therapeutic paracentesis. EXAM: ULTRASOUND GUIDED PARACENTESIS MEDICATIONS: 1% lidocaine 10 mL COMPLICATIONS: None immediate. PROCEDURE: Informed written consent was obtained from the patient after a discussion of the risks, benefits and alternatives to treatment. A timeout was performed prior to the initiation of the procedure. Initial ultrasound scanning demonstrates a large amount of ascites within the right lower abdominal quadrant. The right lower abdomen was prepped and draped in the usual sterile fashion. 1% lidocaine was used for local anesthesia. Following this, a 19 gauge, 7-cm, Yueh catheter was introduced. An ultrasound image was saved for documentation purposes. The paracentesis  was performed. The catheter was removed and a dressing was applied. The patient tolerated the procedure well without immediate post procedural complication. Patient received post-procedure intravenous albumin; see nursing notes for details. FINDINGS: A total of approximately 7.6 L of clear yellow  fluid was removed. Samples were sent to the laboratory as requested by the clinical team. IMPRESSION: Successful ultrasound-guided paracentesis yielding 7.6 liters of peritoneal fluid. Read by: Alwyn Ren, NP PLAN: The patient has previously been formally evaluated by the [facility] Interventional Radiology Portal Hypertension Clinic and is being actively followed for potential future intervention. Electronically Signed   By: Marliss Coots M.D.   On: 07/13/2022 12:47   US Paracentesis  Result Date: 07/09/2022 INDICATION: Cirrhosis and ascites. EXAM: ULTRASOUND GUIDED  PARACENTESIS MEDICATIONS: None. COMPLICATIONS: None immediate. PROCEDURE: Informed written consent was obtained from the patient after a discussion of the risks, benefits and alternatives to treatment. A timeout was performed prior to the initiation of the procedure. Initial ultrasound scanning demonstrates a large amount of ascites within the right lower abdominal quadrant. The right lower abdomen was prepped and draped in the usual sterile fashion. 1% lidocaine was used for local anesthesia. Following this, a 19 gauge Yueh catheter was introduced. An ultrasound image was saved for documentation purposes. The paracentesis was performed. The catheter was removed and a dressing was applied. The patient tolerated the procedure well without immediate post procedural complication. Procedure was performed by Pattricia Boss, PA-C. FINDINGS: A total of approximately 1.7 L of yellow fluid was removed. Samples were sent to the laboratory as requested by the clinical team. IMPRESSION: Successful ultrasound-guided paracentesis yielding 1.7 liters of peritoneal fluid. Electronically Signed   By: Richarda Overlie M.D.   On: 07/09/2022 21:03   [place] ASCITES (ABDOMEN LIMITED)  Result Date: 07/09/2022 CLINICAL DATA:  Cirrhosis; evaluate for ascites EXAM: LIMITED ABDOMEN ULTRASOUND FOR ASCITES TECHNIQUE: Limited ultrasound survey for ascites was performed in all  four abdominal quadrants. COMPARISON:  Ultrasound 07/02/2022 FINDINGS: Moderate abdominopelvic ascites seen in all 4 quadrants. IMPRESSION: Moderate abdominopelvic ascites seen in all 4 quadrants. Electronically Signed   By: Minerva Fester M.D.   On: 07/09/2022 03:55   DG Chest Port 1 View  Result Date: 07/09/2022 CLINICAL DATA:  Fever EXAM: PORTABLE CHEST 1 VIEW COMPARISON:  06/28/2022 FINDINGS: The heart size and mediastinal contours are within normal limits. Both lungs are clear. The visualized skeletal structures are unremarkable. IMPRESSION: No active disease. Electronically Signed   By: Deatra Robinson M.D.   On: 07/09/2022 03:24   US Paracentesis  Result Date: 07/02/2022 INDICATION: Patient with history of cirrhosis and recurrent ascites currently being treated for SBP request received for repeat paracentesis to evaluate for improvement in cell count. [facility] interventional radiology portal hypertension evaluation completed 06/15/2022. Patient is being followed for potential future intervention. EXAM: ULTRASOUND GUIDED  PARACENTESIS MEDICATIONS: Local 1% lidocaine only. COMPLICATIONS: None immediate. PROCEDURE: Informed written consent was obtained from the patient after a discussion of the risks, benefits and alternatives to treatment. A timeout was performed prior to the initiation of the procedure. Initial ultrasound scanning demonstrates a small amount of ascites within the left upper abdominal quadrant. The left upper abdomen was prepped and draped in the usual sterile fashion. 1% lidocaine was used for local anesthesia. Following this, a 19 gauge, 7-cm, Yueh catheter was introduced. An ultrasound image was saved for documentation purposes. The paracentesis was performed. The catheter was removed and a dressing was applied. The patient tolerated the procedure well without immediate post  procedural complication. FINDINGS: A total of approximately 1.9 L of clear yellow fluid was removed. Samples  were sent to the laboratory as requested by the clinical team. IMPRESSION: Successful ultrasound-guided paracentesis yielding 1.9 liters of peritoneal fluid. PLAN: The patient has previously been formally evaluated by the [facility] Interventional Radiology Portal Hypertension Clinic and is being actively followed for potential future intervention. This exam was performed by Pattricia Boss PA-C, and was supervised and interpreted by Dr. Fredia Sorrow. Electronically Signed   By: Irish Lack M.D.   On: 07/02/2022 15:11   US Paracentesis  Result Date: 07/01/2022 INDICATION: Patient with history of cirrhosis with recurrent ascites. [facility] interventional radiology portal hypertension evaluation completed 06/15/2022. Patient is being followed for potential future intervention. EXAM: ULTRASOUND GUIDED THERAPEUTIC LEFT LOWER QUADRANT PARACENTESIS MEDICATIONS: 10 mL 1 % lidocaine COMPLICATIONS: None immediate. PROCEDURE: Informed written consent was obtained from the patient after a discussion of the risks, benefits and alternatives to treatment. A timeout was performed prior to the initiation of the procedure. Initial ultrasound scanning demonstrates a moderate amount of ascites within the left lower abdominal quadrant. The left lower abdomen was prepped and draped in the usual sterile fashion. 1% lidocaine was used for local anesthesia. Following this, a 19 gauge, 7-cm, Yueh catheter was introduced. An ultrasound image was saved for documentation purposes. The paracentesis was performed. The catheter was removed and a dressing was applied. The patient tolerated the procedure well without immediate post procedural complication. Patient received post-procedure intravenous albumin; see nursing notes for details. FINDINGS: A total of approximately 7.5 L of clear, yellow fluid was removed. IMPRESSION: Successful ultrasound-guided paracentesis yielding 7.5 liters of peritoneal fluid. PLAN: The patient has previously been  formally evaluated by the [facility] Interventional Radiology Portal Hypertension Clinic and is being actively followed for potential future intervention. Read by: Alex Gardener, AGNP-BC Electronically Signed   By: Olive Bass M.D.   On: 07/01/2022 07:08   US Paracentesis  Result Date: 06/29/2022 INDICATION: Patient with history of cirrhosis and recurrent ascites, [place] Interventional Radiology Portal Hypertension Clinic evaluation done 06/15/22 EXAM: ULTRASOUND GUIDED LEFT PARACENTESIS MEDICATIONS: None. COMPLICATIONS: None immediate. PROCEDURE: Informed written consent was obtained from the patient after a discussion of the risks, benefits and alternatives to treatment. A timeout was performed prior to the initiation of the procedure. Initial ultrasound scanning demonstrates a large amount of ascites within the right lower abdominal quadrant. The right lower abdomen was prepped and draped in the usual sterile fashion. 1% lidocaine was used for local anesthesia. Following this, a 19 gauge, 7-cm, Yueh catheter was introduced. An ultrasound image was saved for documentation purposes. The paracentesis was performed. The catheter was removed and a dressing was applied. The patient tolerated the procedure well without immediate post procedural complication. FINDINGS: A total of approximately 7.0L of yellow ascitic fluid was removed. IMPRESSION: Successful ultrasound-guided paracentesis yielding 7.0L liters of peritoneal fluid. Procedure performed by; Sheliah Plane IR PA PLAN: The patient has previously been formally evaluated by the [facility] Interventional Radiology Portal Hypertension Clinic and is being actively followed for potential future intervention. Roanna Banning, MD [facility] Radiology Electronically Signed   By: Roanna Banning M.D.   On: 06/29/2022 11:43   US Venous Img Lower Bilateral (DVT)  Result Date: 06/28/2022 CLINICAL DATA:  Lower extremity  edema.  Ascites. EXAM: BILATERAL LOWER EXTREMITY VENOUS DOPPLER ULTRASOUND TECHNIQUE: Gray-scale sonography with compression, as well as color and duplex ultrasound, were performed to evaluate the deep venous system(s) from  the level of the common femoral vein through the popliteal and proximal calf veins. COMPARISON:  None Available. FINDINGS: VENOUS Normal compressibility of the common femoral, superficial femoral, and popliteal veins, as well as the visualized calf veins. Visualized portions of profunda femoral vein and great saphenous vein unremarkable. No filling defects to suggest DVT on grayscale or color Doppler imaging. Doppler waveforms show normal direction of venous flow, normal respiratory plasticity and response to augmentation. OTHER Subcutaneous edema noted in the lower extremities bilaterally. Limitations: Study mildly limited due to lower extremity edema and patient discomfort. IMPRESSION: Negative for DVT in either lower extremity. Electronically Signed   By: Kennith Center M.D.   On: 06/28/2022 06:24   CT ABDOMEN PELVIS W CONTRAST  Result Date: 06/28/2022 CLINICAL DATA:  Abdominal pain, acute, nonlocalized. Alcoholic cirrhosis. EXAM: CT ABDOMEN AND PELVIS WITH CONTRAST TECHNIQUE: Multidetector CT imaging of the abdomen and pelvis was performed using the standard protocol following bolus administration of intravenous contrast. RADIATION DOSE REDUCTION: This exam was performed according to the departmental dose-optimization program which includes automated exposure control, adjustment of the mA and/or kV according to patient size and/or use of iterative reconstruction technique. CONTRAST:  OMNIPAQUE IOHEXOL 300 MG/ML  SOLN COMPARISON:  None Available. FINDINGS: Lower chest: Mild bibasilar dependent atelectasis, right greater than left. Gastroesophageal varices noted adjacent to the distal esophagus. Cardiac size within normal limits. Gynecomastia noted Hepatobiliary: The liver is shrunken  and nodular in keeping with changes of cirrhosis. A slightly hyperenhancing 2 cm nodule is seen within the subserosal left hepatic lobe at axial image # 16/3, not well characterized on this examination. This may represent a dysplastic or regenerative nodule, however, a focal hepatoma is not excluded. No intra or extrahepatic biliary ductal dilation. There is recanalization of the umbilical vein which ultimately communicates to the common femoral veins. Cholelithiasis noted without definite evidence of acute cholecystitis. Pancreas: 13 mm cystic lesion within the a head neck juncture of the pancreas is indeterminate, possibly representing a pancreatic cyst, cystic neoplasm, or dilated pancreatic side branch. The pancreas is otherwise unremarkable. Spleen: The spleen is markedly enlarged measuring up to 20 cm in greatest dimension. No intrasplenic lesions are seen Adrenals/Urinary Tract: The adrenal glands are unremarkable. The kidneys are normal. Foley catheter balloon seen within a decompressed bladder lumen. Stomach/Bowel: Small varices are seen within the gastric cardia. There is circumferential bowel wall thickening involving the gastric antrum, duodenum, and proximal jejunum which is nonspecific, but can be seen in the setting of portal venous hypertension. Inflammatory conditions, however, such as infectious gastroenteritis could appear similarly, however. There is, additionally, circumferential bowel wall thickening involving the rectum and colon with mild pericolonic inflammatory stranding, best appreciated involving the mid sigmoid colon. While changes of portal colopathy can be seen these typically preferentially affect the right colon and the extent favors the presence of an underlying infectious or inflammatory proctocolitis. No evidence of obstruction. Large volume ascites. No free intraperitoneal gas. Vascular/Lymphatic: Main portal vein is patent. Evidence of portal venous hypertension with  gastroesophageal varices and portosystemic collateralization via the umbilical vein to the common femoral veins is noted. Mild aortoiliac atherosclerotic calcification. No aortic aneurysm. No pathologic adenopathy within the abdomen and pelvis. Reproductive: Prostate is unremarkable. Other: Small fluid containing umbilical hernia. Moderate diffuse subcutaneous body wall edema noted. Musculoskeletal: No acute bone abnormality. No lytic or blastic bone lesion IMPRESSION: 1. Morphologic changes in keeping with cirrhosis and portal venous hypertension with marked splenomegaly, gastroesophageal varices, and portosystemic collateralization via  the umbilical vein to the common femoral veins. Large volume ascites. 2. 2 cm hyperenhancing nodule within the subserosal left hepatic lobe, not well characterized on this examination. This may represent a dysplastic or regenerative nodule, however, a focal hepatoma is not excluded. Correlation with serum AFP level and/or dedicated contrast enhanced MRI examination is recommended for further evaluation. 3. Circumferential bowel wall thickening involving the gastric antrum, duodenum, proximal jejunum,, possibly representing changes of portal venous hypertension and resultant edema versus is infectious or inflammatory conditions such as infectious gastroenteritis. 4. Thickening involving the rectum and colon with mild pericolonic inflammatory stranding, best appreciated involving the mid sigmoid colon. While changes of portal colopathy can be seen in the setting of portal venous hypertension, these conditions typically preferentially affect the right colon and the extent favors the presence of an infectious or inflammatory proctocolitis. 5. 13 mm cystic lesion within the head neck juncture of the pancreas. This may represent a pancreatic cyst, cystic neoplasm, or dilated pancreatic side branch. This could be further assessed with MRI examination once the patient's acute issues have  resolved. 6. Cholelithiasis. Electronically Signed   By: Helyn NumbersAshesh  Parikh M.D.   On: 06/28/2022 04:11   DG Chest Portable 1 View  Result Date: 06/28/2022 CLINICAL DATA:  Dyspnea EXAM: PORTABLE CHEST 1 VIEW COMPARISON:  06/01/2022 FINDINGS: Lungs volumes are small and there is progressive elevation of the right hemidiaphragm. No pneumothorax or pleural effusion. Cardiac size within normal limits. Pulmonary vascularity is normal. Osseous structures are age-appropriate. No acute bone abnormality. IMPRESSION: Pulmonary hypoinflation. Progressive elevation of the right hemidiaphragm. Electronically Signed   By: Helyn NumbersAshesh  Parikh M.D.   On: 06/28/2022 02:48    Microbiology: Results for orders placed or performed during the hospital encounter of 07/09/22  Urine Culture     Status: None   Collection Time: 07/09/22  1:25 AM   Specimen: Urine, Random  Result Value Ref Range Status   Specimen Description   Final    URINE, RANDOM Performed at [facility], [address] [place], [place] [address]    Special Requests   Final    NONE Performed at [facility], [address] [place], [place] [address]    Culture   Final    NO GROWTH Performed at [facility], [address] [place], [place] [address]    Report Status 07/10/2022 FINAL  Final  Culture, blood (routine x 2)     Status: None   Collection Time: 07/09/22  2:58 AM   Specimen: BLOOD  Result Value Ref Range Status   Specimen Description BLOOD LEFT ASSIST CONTROL  Final   Special Requests   Final    BOTTLES DRAWN AEROBIC AND ANAEROBIC Blood Culture adequate volume   Culture   Final    NO GROWTH 5 DAYS Performed at [facility], [address] [place], [place] [address]    Report Status 07/14/2022 FINAL  Final  Culture, blood (routine x 2)     Status: None   Collection Time: 07/09/22  2:58 AM   Specimen: BLOOD  Result Value Ref Range Status   Specimen Description BLOOD RIGHT ASSIST CONTROL  Final    Special Requests   Final    BOTTLES DRAWN AEROBIC AND ANAEROBIC Blood Culture adequate volume   Culture   Final    NO GROWTH 5 DAYS Performed at [facility], [address] [place], [place] [address]    Report Status 07/14/2022 FINAL  Final  SARS  Coronavirus 2 by RT PCR (hospital order, performed in [facility] hospital lab) *cepheid single result test* Anterior Nasal Swab     Status: None   Collection Time: 07/09/22  2:59 AM   Specimen: Anterior Nasal Swab  Result Value Ref Range Status   SARS Coronavirus 2 by RT PCR NEGATIVE NEGATIVE Final    Comment: (NOTE) SARS-CoV-2 target nucleic acids are NOT DETECTED.  The SARS-CoV-2 RNA is generally detectable in upper and lower respiratory specimens during the acute phase of infection. The lowest concentration of SARS-CoV-2 viral copies this assay can detect is 250 copies / mL. A negative result does not preclude SARS-CoV-2 infection and should not be used as the sole basis for treatment or other patient management decisions.  A negative result may occur with improper specimen collection / handling, submission of specimen other than nasopharyngeal swab, presence of viral mutation(s) within the areas targeted by this assay, and inadequate number of viral copies (<250 copies / mL). A negative result must be combined with clinical observations, patient history, and epidemiological information.  Fact Sheet for Patients:   https://www..info/  Fact Sheet for Healthcare Providers: https://hall.com/  This test is not yet approved or  cleared by the [place] FDA and has been authorized for detection and/or diagnosis of SARS-CoV-2 by FDA under an Emergency Use Authorization (EUA).  This EUA will remain in effect (meaning this test can be used) for the duration of the COVID-19 declaration under Section 564(b)(1) of the Act, 21 U.S.C. section 360bbb-3(b)(1), unless the authorization  is terminated or revoked sooner.  Performed at [facility], [address] [place], [place] [address]   Body fluid culture w Gram Stain     Status: None   Collection Time: 07/09/22 12:31 PM   Specimen: PATH Cytology Peritoneal fluid  Result Value Ref Range Status   Specimen Description   Final    PERITONEAL Performed at [facility], [address] [place], [place] [address]    Special Requests   Final    NONE Performed at [facility], [address] [place], [place] [address]    Gram Stain   Final    WBC PRESENT,BOTH PMN AND MONONUCLEAR NO ORGANISMS SEEN CYTOSPIN SMEAR    Culture   Final    NO GROWTH 3 DAYS Performed at [facility], [place] [address], [place], [place] [address]    Report Status 07/13/2022 FINAL  Final  Aerobic Culture w Gram Stain (superficial specimen)     Status: None   Collection Time: 07/10/22 10:20 PM   Specimen: Wound  Result Value Ref Range Status   Specimen Description   Final    WOUND Performed at [facility], [address] [place], [place] [address]    Special Requests   Final    Immunocompromised Performed at [facility], [address] [place], [place] [address]    Gram Stain   Final    FEW WBC PRESENT,BOTH PMN AND MONONUCLEAR NO ORGANISMS SEEN Performed at [facility], [place] [address] [place], [place] [address]    Culture RARE METHICILLIN RESISTANT STAPHYLOCOCCUS AUREUS  Final   Report Status 07/14/2022 FINAL  Final   Organism ID, Bacteria METHICILLIN RESISTANT STAPHYLOCOCCUS AUREUS  Final      Susceptibility   Methicillin resistant staphylococcus aureus - MIC*    CIPROFLOXACIN >=8 RESISTANT Resistant     ERYTHROMYCIN >=8 RESISTANT Resistant     GENTAMICIN <=0.5 SENSITIVE Sensitive  OXACILLIN >=4 RESISTANT Resistant     TETRACYCLINE <=1 SENSITIVE Sensitive     VANCOMYCIN <=0.5 SENSITIVE Sensitive     TRIMETH/SULFA 160 RESISTANT Resistant     CLINDAMYCIN  <=0.25 SENSITIVE Sensitive     RIFAMPIN <=0.5 SENSITIVE Sensitive     Inducible Clindamycin NEGATIVE Sensitive     * RARE METHICILLIN RESISTANT STAPHYLOCOCCUS AUREUS    Labs: CBC: Recent Labs  Lab 07/11/22 0632 07/12/22 0645 07/13/22 0527 07/14/22 0632 07/15/22 0611  WBC 4.1 4.7 5.4 6.2 4.9  HGB 10.0* 10.6* 10.5* 10.3* 10.1*  HCT 30.8* 31.9* 31.2* 31.0* 30.8*  MCV 96.6 96.4 96.6 97.8 97.5  PLT 110* 121* 119* 93* 91*   Basic Metabolic Panel: Recent Labs  Lab 07/11/22 0632 07/12/22 0645 07/13/22 0527 07/14/22 0632 07/15/22 0611  NA 132* 132* 133* 130* 132*  K 3.9 3.9 4.0 4.2 3.9  CL 100 102 102 101 105  CO2 GLUCOSE 100* 111* 90 103* 113*  BUN CREATININE 0.60* 0.40* 0.53* 0.57* 0.51*  CALCIUM 7.5* 7.8* 7.8* 7.6* 7.6*  MG 1.6* 1.7 1.7 1.7 1.8  PHOS 2.2* 2.8 3.1  --  2.9   Liver Function Tests: No results for input(s): "AST", "ALT", "ALKPHOS", "BILITOT", "PROT", "ALBUMIN" in the last 168 hours. CBG: No results for input(s): "GLUCAP" in the last 168 hours.  Discharge time spent: greater than 30 minutes.  Signed: Enedina Finner, MD [facility] 07/16/2022

## 2022-07-17 ENCOUNTER — Other Ambulatory Visit: Payer: Self-pay

## 2022-07-17 DIAGNOSIS — K7031 Alcoholic cirrhosis of liver with ascites: Secondary | ICD-10-CM

## 2022-07-17 LAB — ACID FAST CULTURE WITH REFLEXED SENSITIVITIES (MYCOBACTERIA): Acid Fast Culture: NEGATIVE

## 2022-07-17 MED ORDER — ALBUMIN HUMAN 25 % IV SOLN: 25.0000 g | Freq: Once | INTRAVENOUS | Status: AC

## 2022-07-17 NOTE — Progress Notes (Signed)
Signingoutpt standing US paracentesis orders

## 2022-07-17 NOTE — Addendum Note (Signed)
Addended by: Wayna Chalet on: 07/17/2022 10:36 AM   Modules accepted: Orders

## 2022-07-23 ENCOUNTER — Emergency Department: Payer: Medicaid Other

## 2022-07-23 ENCOUNTER — Other Ambulatory Visit: Payer: Self-pay

## 2022-07-23 ENCOUNTER — Inpatient Hospital Stay
Admission: EM | Admit: 2022-07-23 | Discharge: 2022-08-26 | DRG: 441 | Disposition: E | Payer: Medicaid Other | Attending: Internal Medicine | Admitting: Internal Medicine

## 2022-07-23 ENCOUNTER — Ambulatory Visit: Admission: RE | Admit: 2022-07-23 | Payer: Self-pay | Source: Ambulatory Visit

## 2022-07-23 ENCOUNTER — Inpatient Hospital Stay: Payer: Medicaid Other

## 2022-07-23 DIAGNOSIS — T43621A Poisoning by amphetamines, accidental (unintentional), initial encounter: Secondary | ICD-10-CM | POA: Diagnosis present

## 2022-07-23 DIAGNOSIS — J9601 Acute respiratory failure with hypoxia: Secondary | ICD-10-CM | POA: Diagnosis present

## 2022-07-23 DIAGNOSIS — K7031 Alcoholic cirrhosis of liver with ascites: Secondary | ICD-10-CM | POA: Diagnosis present

## 2022-07-23 DIAGNOSIS — E876 Hypokalemia: Secondary | ICD-10-CM | POA: Diagnosis present

## 2022-07-23 DIAGNOSIS — F10129 Alcohol abuse with intoxication, unspecified: Secondary | ICD-10-CM | POA: Diagnosis present

## 2022-07-23 DIAGNOSIS — E875 Hyperkalemia: Secondary | ICD-10-CM | POA: Diagnosis not present

## 2022-07-23 DIAGNOSIS — E440 Moderate protein-calorie malnutrition: Secondary | ICD-10-CM | POA: Diagnosis present

## 2022-07-23 DIAGNOSIS — K7682 Hepatic encephalopathy: Secondary | ICD-10-CM | POA: Diagnosis present

## 2022-07-23 DIAGNOSIS — D6959 Other secondary thrombocytopenia: Secondary | ICD-10-CM | POA: Diagnosis present

## 2022-07-23 DIAGNOSIS — Z66 Do not resuscitate: Secondary | ICD-10-CM | POA: Diagnosis present

## 2022-07-23 DIAGNOSIS — N179 Acute kidney failure, unspecified: Secondary | ICD-10-CM | POA: Diagnosis not present

## 2022-07-23 DIAGNOSIS — R739 Hyperglycemia, unspecified: Secondary | ICD-10-CM | POA: Diagnosis present

## 2022-07-23 DIAGNOSIS — K7200 Acute and subacute hepatic failure without coma: Secondary | ICD-10-CM | POA: Diagnosis present

## 2022-07-23 DIAGNOSIS — B961 Klebsiella pneumoniae [K. pneumoniae] as the cause of diseases classified elsewhere: Secondary | ICD-10-CM | POA: Diagnosis not present

## 2022-07-23 DIAGNOSIS — R680 Hypothermia, not associated with low environmental temperature: Secondary | ICD-10-CM | POA: Diagnosis present

## 2022-07-23 DIAGNOSIS — R4182 Altered mental status, unspecified: Secondary | ICD-10-CM | POA: Diagnosis present

## 2022-07-23 DIAGNOSIS — J9602 Acute respiratory failure with hypercapnia: Secondary | ICD-10-CM | POA: Diagnosis present

## 2022-07-23 DIAGNOSIS — G9341 Metabolic encephalopathy: Secondary | ICD-10-CM | POA: Diagnosis present

## 2022-07-23 DIAGNOSIS — K56609 Unspecified intestinal obstruction, unspecified as to partial versus complete obstruction: Secondary | ICD-10-CM | POA: Diagnosis present

## 2022-07-23 DIAGNOSIS — Z515 Encounter for palliative care: Secondary | ICD-10-CM | POA: Diagnosis not present

## 2022-07-23 DIAGNOSIS — I8510 Secondary esophageal varices without bleeding: Secondary | ICD-10-CM | POA: Diagnosis present

## 2022-07-23 DIAGNOSIS — R319 Hematuria, unspecified: Secondary | ICD-10-CM | POA: Diagnosis present

## 2022-07-23 DIAGNOSIS — J690 Pneumonitis due to inhalation of food and vomit: Secondary | ICD-10-CM | POA: Diagnosis not present

## 2022-07-23 DIAGNOSIS — K766 Portal hypertension: Secondary | ICD-10-CM | POA: Diagnosis present

## 2022-07-23 DIAGNOSIS — R400 Somnolence: Secondary | ICD-10-CM | POA: Diagnosis not present

## 2022-07-23 DIAGNOSIS — R569 Unspecified convulsions: Secondary | ICD-10-CM | POA: Diagnosis present

## 2022-07-23 DIAGNOSIS — R402432 Glasgow coma scale score 3-8, at arrival to emergency department: Principal | ICD-10-CM

## 2022-07-23 DIAGNOSIS — J9811 Atelectasis: Secondary | ICD-10-CM | POA: Diagnosis present

## 2022-07-23 DIAGNOSIS — Z6830 Body mass index (BMI) 30.0-30.9, adult: Secondary | ICD-10-CM

## 2022-07-23 LAB — CBC WITH DIFFERENTIAL/PLATELET
Abs Immature Granulocytes: 0.02 10*3/uL (ref 0.00–0.07)
Basophils Absolute: 0 10*3/uL (ref 0.0–0.1)
Basophils Relative: 0 %
Eosinophils Absolute: 0 10*3/uL (ref 0.0–0.5)
Eosinophils Relative: 1 %
HCT: 33.2 % — ABNORMAL LOW (ref 39.0–52.0)
Hemoglobin: 10.9 g/dL — ABNORMAL LOW (ref 13.0–17.0)
Immature Granulocytes: 0 %
Lymphocytes Relative: 8 %
Lymphs Abs: 0.4 10*3/uL — ABNORMAL LOW (ref 0.7–4.0)
MCH: 31.8 pg (ref 26.0–34.0)
MCHC: 32.8 g/dL (ref 30.0–36.0)
MCV: 96.8 fL (ref 80.0–100.0)
Monocytes Absolute: 0.4 10*3/uL (ref 0.1–1.0)
Monocytes Relative: 8 %
Neutro Abs: 3.9 10*3/uL (ref 1.7–7.7)
Neutrophils Relative %: 83 %
Platelets: 98 10*3/uL — ABNORMAL LOW (ref 150–400)
RBC: 3.43 MIL/uL — ABNORMAL LOW (ref 4.22–5.81)
RDW: 18.1 % — ABNORMAL HIGH (ref 11.5–15.5)
WBC: 4.7 10*3/uL (ref 4.0–10.5)
nRBC: 0 % (ref 0.0–0.2)

## 2022-07-23 LAB — BLOOD GAS, ARTERIAL
Acid-Base Excess: 1.4 mmol/L (ref 0.0–2.0)
Bicarbonate: 24.9 mmol/L (ref 20.0–28.0)
FIO2: 28 %
MECHVT: 550 mL
Mechanical Rate: 16
O2 Saturation: 100 %
PEEP: 5 cmH2O
Patient temperature: 37
RATE: 16 resp/min
Spontaneous VT: 550 mL
pCO2 arterial: 35 mmHg (ref 32–48)
pH, Arterial: 7.46 — ABNORMAL HIGH (ref 7.35–7.45)
pO2, Arterial: 156 mmHg — ABNORMAL HIGH (ref 83–108)

## 2022-07-23 LAB — URINE DRUG SCREEN, QUALITATIVE (ARMC ONLY)
Amphetamines, Ur Screen: POSITIVE — AB
Barbiturates, Ur Screen: NOT DETECTED
Benzodiazepine, Ur Scrn: NOT DETECTED
Cannabinoid 50 Ng, Ur ~~LOC~~: POSITIVE — AB
Cocaine Metabolite,Ur ~~LOC~~: NOT DETECTED
MDMA (Ecstasy)Ur Screen: NOT DETECTED
Methadone Scn, Ur: NOT DETECTED
Opiate, Ur Screen: NOT DETECTED
Phencyclidine (PCP) Ur S: NOT DETECTED
Tricyclic, Ur Screen: POSITIVE — AB

## 2022-07-23 LAB — AMMONIA: Ammonia: 152 umol/L — ABNORMAL HIGH (ref 9–35)

## 2022-07-23 LAB — URINALYSIS, ROUTINE W REFLEX MICROSCOPIC
Bacteria, UA: NONE SEEN
Glucose, UA: NEGATIVE mg/dL
Hgb urine dipstick: NEGATIVE
Ketones, ur: 5 mg/dL — AB
Leukocytes,Ua: NEGATIVE
Nitrite: NEGATIVE
Protein, ur: 100 mg/dL — AB
Specific Gravity, Urine: 1.028 (ref 1.005–1.030)
pH: 6 (ref 5.0–8.0)

## 2022-07-23 LAB — LACTIC ACID, PLASMA
Lactic Acid, Venous: 2.6 mmol/L (ref 0.5–1.9)
Lactic Acid, Venous: 2 mmol/L (ref 0.5–1.9)
Lactic Acid, Venous: 1.7 mmol/L (ref 0.5–1.9)
Lactic Acid, Venous: 1.6 mmol/L (ref 0.5–1.9)

## 2022-07-23 LAB — MRSA NEXT GEN BY PCR, NASAL: MRSA by PCR Next Gen: DETECTED — AB

## 2022-07-23 LAB — ACETAMINOPHEN LEVEL: Acetaminophen (Tylenol), Serum: <10 ug/mL — ABNORMAL LOW (ref 10–30)

## 2022-07-23 LAB — TROPONIN I (HIGH SENSITIVITY)
Troponin I (High Sensitivity): 3 ng/L (ref <18)
Troponin I (High Sensitivity): 3 ng/L (ref <18)

## 2022-07-23 LAB — ETHANOL: Alcohol, Ethyl (B): <10 mg/dL (ref <10)

## 2022-07-23 LAB — BRAIN NATRIURETIC PEPTIDE: B Natriuretic Peptide: 20.2 pg/mL (ref 0.0–100.0)

## 2022-07-23 LAB — GLUCOSE, CAPILLARY
Glucose-Capillary: 121 mg/dL — ABNORMAL HIGH (ref 70–99)
Glucose-Capillary: 109 mg/dL — ABNORMAL HIGH (ref 70–99)

## 2022-07-23 LAB — PROCALCITONIN: Procalcitonin: <0.1 ng/mL

## 2022-07-23 LAB — PHOSPHORUS: Phosphorus: 3.7 mg/dL (ref 2.5–4.6)

## 2022-07-23 LAB — MAGNESIUM: Magnesium: 2.1 mg/dL (ref 1.7–2.4)

## 2022-07-23 MED ORDER — THIAMINE HCL 100 MG/ML IJ SOLN
250.0000 mg | Freq: Every day | INTRAVENOUS | Status: AC
  Administered 2022-07-27 – 2022-07-28 (×2): 250 mg via INTRAVENOUS
  Filled 2022-07-23 (×2): qty 2.5

## 2022-07-23 MED ORDER — KETAMINE HCL 50 MG/5ML IJ SOSY
1.0000 mg/kg | PREFILLED_SYRINGE | Freq: Once | INTRAMUSCULAR | Status: AC
  Administered 2022-07-23: 100 mg via INTRAVENOUS
  Filled 2022-07-23: qty 15

## 2022-07-23 MED ORDER — THIAMINE HCL 100 MG/ML IJ SOLN
500.0000 mg | Freq: Three times a day (TID) | INTRAVENOUS | Status: AC
  Administered 2022-07-23 – 2022-07-26 (×9): 500 mg via INTRAVENOUS
  Filled 2022-07-23 (×9): qty 5

## 2022-07-23 MED ORDER — PROPOFOL 1000 MG/100ML IV EMUL
0.0000 ug/kg/min | INTRAVENOUS | Status: DC
  Administered 2022-07-23: 30 ug/kg/min via INTRAVENOUS
  Administered 2022-07-23: 5 ug/kg/min via INTRAVENOUS
  Administered 2022-07-23: 30 ug/kg/min via INTRAVENOUS
  Administered 2022-07-23: 15 ug/kg/min via INTRAVENOUS
  Administered 2022-07-23 – 2022-07-24 (×5): 30 ug/kg/min via INTRAVENOUS
  Administered 2022-07-24: 20 ug/kg/min via INTRAVENOUS
  Administered 2022-07-25: 25 ug/kg/min via INTRAVENOUS
  Filled 2022-07-23 (×9): qty 100

## 2022-07-23 MED ORDER — ORAL CARE MOUTH RINSE
15.0000 mL | OROMUCOSAL | Status: DC
  Administered 2022-07-23 – 2022-07-26 (×37): 15 mL via OROMUCOSAL

## 2022-07-23 MED ORDER — ENOXAPARIN SODIUM 40 MG/0.4ML IJ SOSY
40.0000 mg | PREFILLED_SYRINGE | INTRAMUSCULAR | Status: DC
  Administered 2022-07-23 – 2022-07-25 (×3): 40 mg via SUBCUTANEOUS
  Filled 2022-07-23 (×3): qty 0.4

## 2022-07-23 MED ORDER — MIDAZOLAM HCL 2 MG/2ML IJ SOLN
2.0000 mg | INTRAMUSCULAR | Status: DC | PRN
  Filled 2022-07-23: qty 2

## 2022-07-23 MED ORDER — FENTANYL BOLUS VIA INFUSION
50.0000 ug | INTRAVENOUS | Status: DC | PRN
  Administered 2022-07-23 (×2): 100 ug via INTRAVENOUS

## 2022-07-23 MED ORDER — INSULIN ASPART 100 UNIT/ML IJ SOLN
0.0000 [IU] | INTRAMUSCULAR | Status: DC
  Administered 2022-07-23 – 2022-07-24 (×2): 2 [IU] via SUBCUTANEOUS
  Administered 2022-07-25: 3 [IU] via SUBCUTANEOUS
  Administered 2022-07-26 (×2): 2 [IU] via SUBCUTANEOUS
  Administered 2022-07-26: 3 [IU] via SUBCUTANEOUS
  Administered 2022-07-26 – 2022-07-31 (×8): 2 [IU] via SUBCUTANEOUS
  Filled 2022-07-23 (×15): qty 1

## 2022-07-23 MED ORDER — FENTANYL CITRATE PF 50 MCG/ML IJ SOSY
50.0000 ug | PREFILLED_SYRINGE | Freq: Once | INTRAMUSCULAR | Status: AC
  Administered 2022-07-23: 50 ug via INTRAVENOUS
  Filled 2022-07-23: qty 1

## 2022-07-23 MED ORDER — POTASSIUM CHLORIDE 20 MEQ PO PACK
20.0000 meq | PACK | Freq: Once | ORAL | Status: AC
  Administered 2022-07-23: 20 meq
  Filled 2022-07-23: qty 1

## 2022-07-23 MED ORDER — CHLORHEXIDINE GLUCONATE CLOTH 2 % EX PADS
6.0000 | MEDICATED_PAD | Freq: Every day | CUTANEOUS | Status: DC
  Administered 2022-07-23: 6 via TOPICAL

## 2022-07-23 MED ORDER — POLYETHYLENE GLYCOL 3350 17 G PO PACK: 17.0000 g | PACK | Freq: Every day | ORAL | Status: DC | PRN

## 2022-07-23 MED ORDER — LACTULOSE 10 GM/15ML PO SOLN
20.0000 g | Freq: Two times a day (BID) | ORAL | Status: DC
  Administered 2022-07-23 – 2022-07-24 (×4): 20 g
  Filled 2022-07-23 (×4): qty 30

## 2022-07-23 MED ORDER — ORAL CARE MOUTH RINSE: 15.0000 mL | OROMUCOSAL | Status: DC | PRN

## 2022-07-23 MED ORDER — PANTOPRAZOLE SODIUM 40 MG IV SOLR
40.0000 mg | Freq: Every day | INTRAVENOUS | Status: DC
  Administered 2022-07-23: 40 mg via INTRAVENOUS
  Filled 2022-07-23 (×2): qty 10

## 2022-07-23 MED ORDER — MUPIROCIN 2 % EX OINT
1.0000 | TOPICAL_OINTMENT | Freq: Two times a day (BID) | CUTANEOUS | Status: AC
  Administered 2022-07-23 – 2022-07-28 (×10): 1 via NASAL
  Filled 2022-07-23 (×2): qty 22

## 2022-07-23 MED ORDER — ADULT MULTIVITAMIN W/MINERALS CH
1.0000 | ORAL_TABLET | Freq: Every day | ORAL | Status: DC
  Administered 2022-07-23 – 2022-07-26 (×4): 1
  Filled 2022-07-23 (×4): qty 1

## 2022-07-23 MED ORDER — DOCUSATE SODIUM 50 MG/5ML PO LIQD
100.0000 mg | Freq: Two times a day (BID) | ORAL | Status: DC
  Administered 2022-07-23 – 2022-07-26 (×8): 100 mg
  Filled 2022-07-23 (×8): qty 10

## 2022-07-23 MED ORDER — THIAMINE HCL 100 MG/ML IJ SOLN
100.0000 mg | Freq: Every day | INTRAMUSCULAR | Status: DC
  Administered 2022-07-29 – 2022-07-31 (×3): 100 mg via INTRAVENOUS
  Filled 2022-07-23 (×3): qty 2

## 2022-07-23 MED ORDER — FENTANYL 2500MCG IN NS 250ML (10MCG/ML) PREMIX INFUSION
50.0000 ug/h | INTRAVENOUS | Status: DC
  Administered 2022-07-23: 50 ug/h via INTRAVENOUS
  Administered 2022-07-23 – 2022-07-24 (×2): 100 ug/h via INTRAVENOUS
  Filled 2022-07-23 (×2): qty 250

## 2022-07-23 MED ORDER — LACTATED RINGERS IV SOLN
INTRAVENOUS | Status: DC
  Administered 2022-07-24: 75 mL/h via INTRAVENOUS

## 2022-07-23 MED ORDER — DOCUSATE SODIUM 100 MG PO CAPS: 100.0000 mg | ORAL_CAPSULE | Freq: Two times a day (BID) | ORAL | Status: DC | PRN

## 2022-07-23 MED ORDER — DOCUSATE SODIUM 50 MG/5ML PO LIQD: 100.0000 mg | Freq: Two times a day (BID) | ORAL | Status: DC | PRN

## 2022-07-23 MED ORDER — ROCURONIUM BROMIDE 10 MG/ML (PF) SYRINGE
100.0000 mg | PREFILLED_SYRINGE | Freq: Once | INTRAVENOUS | Status: AC
  Administered 2022-07-23: 100 mg via INTRAVENOUS
  Filled 2022-07-23: qty 10

## 2022-07-23 MED ORDER — MIDAZOLAM HCL 2 MG/2ML IJ SOLN
2.0000 mg | INTRAMUSCULAR | Status: DC | PRN
  Administered 2022-07-26: 2 mg via INTRAVENOUS
  Filled 2022-07-23: qty 2

## 2022-07-23 MED ORDER — PROPOFOL 10 MG/ML IV BOLUS
100.0000 mg | Freq: Once | INTRAVENOUS | Status: AC
  Administered 2022-07-23: 100 mg via INTRAVENOUS

## 2022-07-23 MED ORDER — FENTANYL BOLUS VIA INFUSION: 50.0000 ug | INTRAVENOUS | Status: DC | PRN

## 2022-07-23 MED ORDER — FOLIC ACID 1 MG PO TABS
1.0000 mg | ORAL_TABLET | Freq: Every day | ORAL | Status: DC
  Administered 2022-07-23 – 2022-07-26 (×4): 1 mg
  Filled 2022-07-23 (×4): qty 1

## 2022-07-23 MED ORDER — POLYETHYLENE GLYCOL 3350 17 G PO PACK
17.0000 g | PACK | Freq: Every day | ORAL | Status: DC
  Administered 2022-07-23 – 2022-07-26 (×4): 17 g
  Filled 2022-07-23 (×4): qty 1

## 2022-07-24 ENCOUNTER — Inpatient Hospital Stay: Payer: Medicaid Other

## 2022-07-24 DIAGNOSIS — K7682 Hepatic encephalopathy: Secondary | ICD-10-CM | POA: Diagnosis not present

## 2022-07-24 LAB — PROCALCITONIN: Procalcitonin: <0.1 ng/mL

## 2022-07-24 LAB — GLUCOSE, CAPILLARY
Glucose-Capillary: 101 mg/dL — ABNORMAL HIGH (ref 70–99)
Glucose-Capillary: 104 mg/dL — ABNORMAL HIGH (ref 70–99)
Glucose-Capillary: 108 mg/dL — ABNORMAL HIGH (ref 70–99)
Glucose-Capillary: 114 mg/dL — ABNORMAL HIGH (ref 70–99)
Glucose-Capillary: 116 mg/dL — ABNORMAL HIGH (ref 70–99)
Glucose-Capillary: 117 mg/dL — ABNORMAL HIGH (ref 70–99)
Glucose-Capillary: 128 mg/dL — ABNORMAL HIGH (ref 70–99)

## 2022-07-24 LAB — CBC
HCT: 35.8 % — ABNORMAL LOW (ref 39.0–52.0)
Hemoglobin: 11.9 g/dL — ABNORMAL LOW (ref 13.0–17.0)
MCH: 32.3 pg (ref 26.0–34.0)
MCHC: 33.2 g/dL (ref 30.0–36.0)
MCV: 97.3 fL (ref 80.0–100.0)
Platelets: 143 10*3/uL — ABNORMAL LOW (ref 150–400)
RBC: 3.68 MIL/uL — ABNORMAL LOW (ref 4.22–5.81)
RDW: 18.9 % — ABNORMAL HIGH (ref 11.5–15.5)
WBC: 9.8 10*3/uL (ref 4.0–10.5)
nRBC: 0 % (ref 0.0–0.2)

## 2022-07-24 LAB — GLUCOSE, PLEURAL OR PERITONEAL FLUID: Glucose, Fluid: 112 mg/dL

## 2022-07-24 LAB — BODY FLUID CELL COUNT WITH DIFFERENTIAL
Eos, Fluid: 0 %
Lymphs, Fluid: 49 %
Monocyte-Macrophage-Serous Fluid: 41 %
Neutrophil Count, Fluid: 10 %
Total Nucleated Cell Count, Fluid: 269 cu mm

## 2022-07-24 LAB — HEMOGLOBIN A1C
Hgb A1c MFr Bld: 3.9 % — ABNORMAL LOW (ref 4.8–5.6)
Mean Plasma Glucose: 65.23 mg/dL

## 2022-07-24 LAB — MAGNESIUM: Magnesium: 2 mg/dL (ref 1.7–2.4)

## 2022-07-24 LAB — PROTIME-INR
INR: 1.6 — ABNORMAL HIGH (ref 0.8–1.2)
Prothrombin Time: 18.5 seconds — ABNORMAL HIGH (ref 11.4–15.2)

## 2022-07-24 LAB — TRIGLYCERIDES: Triglycerides: 53 mg/dL (ref <150)

## 2022-07-24 LAB — PATHOLOGIST SMEAR REVIEW

## 2022-07-24 LAB — PHOSPHORUS: Phosphorus: 4.4 mg/dL (ref 2.5–4.6)

## 2022-07-24 LAB — AMMONIA: Ammonia: 88 umol/L — ABNORMAL HIGH (ref 9–35)

## 2022-07-24 MED ORDER — FREE WATER
30.0000 mL | Status: DC
  Administered 2022-07-24 – 2022-07-27 (×16): 30 mL

## 2022-07-24 MED ORDER — SPIRONOLACTONE 25 MG PO TABS
50.0000 mg | ORAL_TABLET | Freq: Every day | ORAL | Status: DC
  Administered 2022-07-24 – 2022-07-26 (×3): 50 mg
  Filled 2022-07-24 (×3): qty 2

## 2022-07-24 MED ORDER — PROSOURCE TF20 ENFIT COMPATIBL EN LIQD
60.0000 mL | Freq: Two times a day (BID) | ENTERAL | Status: DC
  Administered 2022-07-25 – 2022-07-26 (×4): 60 mL

## 2022-07-24 MED ORDER — FUROSEMIDE 20 MG PO TABS
40.0000 mg | ORAL_TABLET | Freq: Every day | ORAL | Status: DC
  Administered 2022-07-24 – 2022-07-26 (×3): 40 mg
  Filled 2022-07-24 (×3): qty 2

## 2022-07-24 MED ORDER — RIFAXIMIN 550 MG PO TABS
550.0000 mg | ORAL_TABLET | Freq: Two times a day (BID) | ORAL | Status: DC
  Administered 2022-07-24 – 2022-07-26 (×6): 550 mg
  Filled 2022-07-24 (×6): qty 1

## 2022-07-24 MED ORDER — CHLORHEXIDINE GLUCONATE CLOTH 2 % EX PADS
6.0000 | MEDICATED_PAD | Freq: Every day | CUTANEOUS | Status: DC
  Administered 2022-07-24 – 2022-07-31 (×8): 6 via TOPICAL

## 2022-07-24 MED ORDER — VITAL 1.5 CAL PO LIQD
1000.0000 mL | ORAL | Status: DC
  Administered 2022-07-24 – 2022-07-26 (×2): 1000 mL

## 2022-07-24 MED ORDER — PANTOPRAZOLE 2 MG/ML SUSPENSION
40.0000 mg | Freq: Every day | ORAL | Status: DC
  Administered 2022-07-24 – 2022-07-26 (×3): 40 mg
  Filled 2022-07-24 (×3): qty 20

## 2022-07-24 MED ORDER — JUVEN PO PACK
1.0000 | PACK | Freq: Two times a day (BID) | ORAL | Status: DC
  Administered 2022-07-25 – 2022-07-26 (×4): 1

## 2022-07-24 MED ORDER — POTASSIUM CHLORIDE 20 MEQ PO PACK
20.0000 meq | PACK | Freq: Once | ORAL | Status: AC
  Administered 2022-07-24: 20 meq
  Filled 2022-07-24: qty 1

## 2022-07-25 DIAGNOSIS — E440 Moderate protein-calorie malnutrition: Secondary | ICD-10-CM | POA: Insufficient documentation

## 2022-07-25 LAB — CBC
HCT: 34.1 % — ABNORMAL LOW (ref 39.0–52.0)
Hemoglobin: 11.2 g/dL — ABNORMAL LOW (ref 13.0–17.0)
MCH: 31.6 pg (ref 26.0–34.0)
MCHC: 32.8 g/dL (ref 30.0–36.0)
MCV: 96.3 fL (ref 80.0–100.0)
Platelets: 103 10*3/uL — ABNORMAL LOW (ref 150–400)
RBC: 3.54 MIL/uL — ABNORMAL LOW (ref 4.22–5.81)
RDW: 18.5 % — ABNORMAL HIGH (ref 11.5–15.5)
WBC: 6.5 10*3/uL (ref 4.0–10.5)
nRBC: 0 % (ref 0.0–0.2)

## 2022-07-25 LAB — GLUCOSE, CAPILLARY
Glucose-Capillary: 113 mg/dL — ABNORMAL HIGH (ref 70–99)
Glucose-Capillary: 110 mg/dL — ABNORMAL HIGH (ref 70–99)
Glucose-Capillary: 94 mg/dL (ref 70–99)
Glucose-Capillary: 120 mg/dL — ABNORMAL HIGH (ref 70–99)
Glucose-Capillary: 165 mg/dL — ABNORMAL HIGH (ref 70–99)

## 2022-07-25 LAB — PROTIME-INR
INR: 1.4 — ABNORMAL HIGH (ref 0.8–1.2)
Prothrombin Time: 17.2 seconds — ABNORMAL HIGH (ref 11.4–15.2)

## 2022-07-25 LAB — PHOSPHORUS: Phosphorus: 3.6 mg/dL (ref 2.5–4.6)

## 2022-07-25 LAB — MAGNESIUM: Magnesium: 1.9 mg/dL (ref 1.7–2.4)

## 2022-07-25 LAB — AMMONIA: Ammonia: 97 umol/L — ABNORMAL HIGH (ref 9–35)

## 2022-07-25 LAB — PROCALCITONIN: Procalcitonin: <0.1 ng/mL

## 2022-07-25 LAB — HIV ANTIBODY (ROUTINE TESTING W REFLEX): HIV Screen 4th Generation wRfx: NONREACTIVE

## 2022-07-25 MED ORDER — LACTULOSE 10 GM/15ML PO SOLN: 30.0000 g | Freq: Two times a day (BID) | ORAL | Status: DC

## 2022-07-25 MED ORDER — LACTULOSE 10 GM/15ML PO SOLN
30.0000 g | Freq: Three times a day (TID) | ORAL | Status: DC
  Administered 2022-07-25 – 2022-07-26 (×6): 30 g
  Filled 2022-07-25 (×6): qty 60

## 2022-07-25 MED ORDER — DEXMEDETOMIDINE HCL IN NACL 400 MCG/100ML IV SOLN
0.4000 ug/kg/h | INTRAVENOUS | Status: DC
  Administered 2022-07-25: 0.5 ug/kg/h via INTRAVENOUS
  Administered 2022-07-25: 0.4 ug/kg/h via INTRAVENOUS
  Administered 2022-07-26: 1 ug/kg/h via INTRAVENOUS
  Filled 2022-07-25 (×4): qty 100

## 2022-07-26 ENCOUNTER — Inpatient Hospital Stay: Payer: Medicaid Other

## 2022-07-26 DIAGNOSIS — K7682 Hepatic encephalopathy: Secondary | ICD-10-CM | POA: Diagnosis not present

## 2022-07-26 DIAGNOSIS — R400 Somnolence: Secondary | ICD-10-CM | POA: Diagnosis not present

## 2022-07-26 LAB — CULTURE, RESPIRATORY W GRAM STAIN: Culture: NORMAL

## 2022-07-26 LAB — CBC
HCT: 34.6 % — ABNORMAL LOW (ref 39.0–52.0)
Hemoglobin: 11.5 g/dL — ABNORMAL LOW (ref 13.0–17.0)
MCH: 32.4 pg (ref 26.0–34.0)
MCHC: 33.2 g/dL (ref 30.0–36.0)
MCV: 97.5 fL (ref 80.0–100.0)
Platelets: 95 10*3/uL — ABNORMAL LOW (ref 150–400)
RBC: 3.55 MIL/uL — ABNORMAL LOW (ref 4.22–5.81)
RDW: 17.7 % — ABNORMAL HIGH (ref 11.5–15.5)
WBC: 6.2 10*3/uL (ref 4.0–10.5)
nRBC: 0 % (ref 0.0–0.2)

## 2022-07-26 LAB — GLUCOSE, CAPILLARY
Glucose-Capillary: 116 mg/dL — ABNORMAL HIGH (ref 70–99)
Glucose-Capillary: 117 mg/dL — ABNORMAL HIGH (ref 70–99)
Glucose-Capillary: 141 mg/dL — ABNORMAL HIGH (ref 70–99)
Glucose-Capillary: 142 mg/dL — ABNORMAL HIGH (ref 70–99)
Glucose-Capillary: 148 mg/dL — ABNORMAL HIGH (ref 70–99)
Glucose-Capillary: 160 mg/dL — ABNORMAL HIGH (ref 70–99)

## 2022-07-26 LAB — AMMONIA: Ammonia: 78 umol/L — ABNORMAL HIGH (ref 9–35)

## 2022-07-26 LAB — PHOSPHORUS: Phosphorus: 3.5 mg/dL (ref 2.5–4.6)

## 2022-07-26 LAB — MAGNESIUM: Magnesium: 2 mg/dL (ref 1.7–2.4)

## 2022-07-26 MED ORDER — SPIRONOLACTONE 25 MG PO TABS
50.0000 mg | ORAL_TABLET | Freq: Two times a day (BID) | ORAL | Status: DC
  Administered 2022-07-26: 50 mg
  Filled 2022-07-26: qty 2

## 2022-07-26 MED ORDER — MORPHINE SULFATE (PF) 2 MG/ML IV SOLN
INTRAVENOUS | Status: AC
  Administered 2022-07-26: 2 mg via INTRAVENOUS
  Filled 2022-07-26: qty 1

## 2022-07-26 MED ORDER — DEXMEDETOMIDINE HCL IN NACL 400 MCG/100ML IV SOLN
0.4000 ug/kg/h | INTRAVENOUS | Status: DC
  Administered 2022-07-26: 0.4 ug/kg/h via INTRAVENOUS
  Administered 2022-07-27: 0.6 ug/kg/h via INTRAVENOUS
  Filled 2022-07-26 (×2): qty 100

## 2022-07-26 MED ORDER — SODIUM CHLORIDE 0.9 % IV SOLN
1.0000 g | Freq: Two times a day (BID) | INTRAVENOUS | Status: DC
  Administered 2022-07-26 – 2022-07-27 (×3): 1 g via INTRAVENOUS
  Filled 2022-07-26 (×3): qty 1
  Filled 2022-07-26: qty 10

## 2022-07-26 MED ORDER — SODIUM CHLORIDE 0.9 % IV SOLN
500.0000 mg | INTRAVENOUS | Status: DC
  Administered 2022-07-26 – 2022-07-27 (×2): 500 mg via INTRAVENOUS
  Filled 2022-07-26 (×2): qty 500

## 2022-07-26 MED ORDER — FUROSEMIDE 10 MG/ML IJ SOLN
40.0000 mg | Freq: Two times a day (BID) | INTRAMUSCULAR | Status: DC
  Administered 2022-07-26 – 2022-07-27 (×2): 40 mg via INTRAVENOUS
  Filled 2022-07-26 (×2): qty 4

## 2022-07-26 MED ORDER — LACTULOSE ENEMA
300.0000 mL | Freq: Once | ORAL | Status: AC
  Administered 2022-07-26: 300 mL via RECTAL
  Filled 2022-07-26: qty 300

## 2022-07-26 MED ORDER — FUROSEMIDE 20 MG PO TABS: 40.0000 mg | ORAL_TABLET | Freq: Two times a day (BID) | ORAL | Status: DC

## 2022-07-26 MED ORDER — MORPHINE SULFATE (PF) 2 MG/ML IV SOLN
2.0000 mg | Freq: Three times a day (TID) | INTRAVENOUS | Status: DC | PRN
  Administered 2022-07-26 – 2022-07-31 (×2): 2 mg via INTRAVENOUS
  Filled 2022-07-26: qty 1

## 2022-07-26 MED ORDER — FUROSEMIDE 10 MG/ML IJ SOLN: 40.0000 mg | Freq: Two times a day (BID) | INTRAMUSCULAR | Status: DC

## 2022-07-26 MED ORDER — ORAL CARE MOUTH RINSE: 15.0000 mL | OROMUCOSAL | Status: DC | PRN

## 2022-07-26 DEATH — deceased

## 2022-07-27 ENCOUNTER — Inpatient Hospital Stay: Payer: Self-pay

## 2022-07-27 ENCOUNTER — Inpatient Hospital Stay: Payer: Medicaid Other

## 2022-07-27 DIAGNOSIS — R402432 Glasgow coma scale score 3-8, at arrival to emergency department: Secondary | ICD-10-CM

## 2022-07-27 DIAGNOSIS — K7682 Hepatic encephalopathy: Secondary | ICD-10-CM

## 2022-07-27 DIAGNOSIS — E440 Moderate protein-calorie malnutrition: Secondary | ICD-10-CM

## 2022-07-27 DIAGNOSIS — R400 Somnolence: Secondary | ICD-10-CM

## 2022-07-27 LAB — COMPREHENSIVE METABOLIC PANEL
ALT: 50 U/L — ABNORMAL HIGH (ref 0–44)
ALT: 50 U/L — ABNORMAL HIGH (ref 0–44)
ALT: 40 U/L (ref 0–44)
ALT: 32 U/L (ref 0–44)
ALT: 31 U/L (ref 0–44)
AST: 61 U/L — ABNORMAL HIGH (ref 15–41)
AST: 51 U/L — ABNORMAL HIGH (ref 15–41)
AST: 35 U/L (ref 15–41)
AST: 32 U/L (ref 15–41)
AST: 47 U/L — ABNORMAL HIGH (ref 15–41)
Albumin: 2.3 g/dL — ABNORMAL LOW (ref 3.5–5.0)
Albumin: 2.5 g/dL — ABNORMAL LOW (ref 3.5–5.0)
Albumin: 2.2 g/dL — ABNORMAL LOW (ref 3.5–5.0)
Albumin: 2.2 g/dL — ABNORMAL LOW (ref 3.5–5.0)
Albumin: 2.2 g/dL — ABNORMAL LOW (ref 3.5–5.0)
Alkaline Phosphatase: 126 U/L (ref 38–126)
Alkaline Phosphatase: 127 U/L — ABNORMAL HIGH (ref 38–126)
Alkaline Phosphatase: 111 U/L (ref 38–126)
Alkaline Phosphatase: 100 U/L (ref 38–126)
Alkaline Phosphatase: 101 U/L (ref 38–126)
Anion gap: 8 (ref 5–15)
Anion gap: 7 (ref 5–15)
Anion gap: 3 — ABNORMAL LOW (ref 5–15)
Anion gap: 3 — ABNORMAL LOW (ref 5–15)
Anion gap: 8 (ref 5–15)
BUN: 12 mg/dL (ref 6–20)
BUN: 15 mg/dL (ref 6–20)
BUN: 19 mg/dL (ref 6–20)
BUN: 25 mg/dL — ABNORMAL HIGH (ref 6–20)
BUN: 35 mg/dL — ABNORMAL HIGH (ref 6–20)
CO2: 23 mmol/L (ref 22–32)
CO2: 23 mmol/L (ref 22–32)
CO2: 25 mmol/L (ref 22–32)
CO2: 25 mmol/L (ref 22–32)
CO2: 22 mmol/L (ref 22–32)
Calcium: 7.8 mg/dL — ABNORMAL LOW (ref 8.9–10.3)
Calcium: 8.4 mg/dL — ABNORMAL LOW (ref 8.9–10.3)
Calcium: 8.1 mg/dL — ABNORMAL LOW (ref 8.9–10.3)
Calcium: 8.1 mg/dL — ABNORMAL LOW (ref 8.9–10.3)
Calcium: 8.8 mg/dL — ABNORMAL LOW (ref 8.9–10.3)
Chloride: 105 mmol/L (ref 98–111)
Chloride: 106 mmol/L (ref 98–111)
Chloride: 109 mmol/L (ref 98–111)
Chloride: 111 mmol/L (ref 98–111)
Chloride: 111 mmol/L (ref 98–111)
Creatinine, Ser: 0.78 mg/dL (ref 0.61–1.24)
Creatinine, Ser: 0.71 mg/dL (ref 0.61–1.24)
Creatinine, Ser: 0.6 mg/dL — ABNORMAL LOW (ref 0.61–1.24)
Creatinine, Ser: 0.55 mg/dL — ABNORMAL LOW (ref 0.61–1.24)
Creatinine, Ser: 0.64 mg/dL (ref 0.61–1.24)
GFR, Estimated: 60 mL/min — ABNORMAL LOW (ref >60)
GFR, Estimated: >60 mL/min (ref >60)
GFR, Estimated: >60 mL/min (ref >60)
GFR, Estimated: >60 mL/min (ref >60)
GFR, Estimated: >60 mL/min (ref >60)
Glucose, Bld: 147 mg/dL — ABNORMAL HIGH (ref 70–99)
Glucose, Bld: 104 mg/dL — ABNORMAL HIGH (ref 70–99)
Glucose, Bld: 124 mg/dL — ABNORMAL HIGH (ref 70–99)
Glucose, Bld: 143 mg/dL — ABNORMAL HIGH (ref 70–99)
Glucose, Bld: 131 mg/dL — ABNORMAL HIGH (ref 70–99)
Potassium: 3.3 mmol/L — ABNORMAL LOW (ref 3.5–5.1)
Potassium: 3.8 mmol/L (ref 3.5–5.1)
Potassium: 4.3 mmol/L (ref 3.5–5.1)
Potassium: 4.1 mmol/L (ref 3.5–5.1)
Potassium: 5.2 mmol/L — ABNORMAL HIGH (ref 3.5–5.1)
Sodium: 136 mmol/L (ref 135–145)
Sodium: 136 mmol/L (ref 135–145)
Sodium: 137 mmol/L (ref 135–145)
Sodium: 139 mmol/L (ref 135–145)
Sodium: 141 mmol/L (ref 135–145)
Total Bilirubin: 4.4 mg/dL — ABNORMAL HIGH (ref 0.3–1.2)
Total Bilirubin: 6.2 mg/dL — ABNORMAL HIGH (ref 0.3–1.2)
Total Bilirubin: 4.7 mg/dL — ABNORMAL HIGH (ref 0.3–1.2)
Total Bilirubin: 4.2 mg/dL — ABNORMAL HIGH (ref 0.3–1.2)
Total Bilirubin: 6.4 mg/dL — ABNORMAL HIGH (ref 0.3–1.2)
Total Protein: 6.1 g/dL — ABNORMAL LOW (ref 6.5–8.1)
Total Protein: 6.5 g/dL (ref 6.5–8.1)
Total Protein: 6 g/dL — ABNORMAL LOW (ref 6.5–8.1)
Total Protein: 6 g/dL — ABNORMAL LOW (ref 6.5–8.1)
Total Protein: 6.3 g/dL — ABNORMAL LOW (ref 6.5–8.1)

## 2022-07-27 LAB — HCV RT-PCR, QUANT (NON-GRAPH): Hepatitis C Quantitation: NOT DETECTED IU/mL

## 2022-07-27 LAB — CBC
HCT: 46 % (ref 39.0–52.0)
Hemoglobin: 15.1 g/dL (ref 13.0–17.0)
MCH: 32.1 pg (ref 26.0–34.0)
MCHC: 32.8 g/dL (ref 30.0–36.0)
MCV: 97.9 fL (ref 80.0–100.0)
Platelets: 183 10*3/uL (ref 150–400)
RBC: 4.7 MIL/uL (ref 4.22–5.81)
RDW: 19.4 % — ABNORMAL HIGH (ref 11.5–15.5)
WBC: 19.2 10*3/uL — ABNORMAL HIGH (ref 4.0–10.5)
nRBC: 0.2 % (ref 0.0–0.2)

## 2022-07-27 LAB — GLUCOSE, CAPILLARY
Glucose-Capillary: 109 mg/dL — ABNORMAL HIGH (ref 70–99)
Glucose-Capillary: 113 mg/dL — ABNORMAL HIGH (ref 70–99)
Glucose-Capillary: 113 mg/dL — ABNORMAL HIGH (ref 70–99)
Glucose-Capillary: 122 mg/dL — ABNORMAL HIGH (ref 70–99)
Glucose-Capillary: 129 mg/dL — ABNORMAL HIGH (ref 70–99)
Glucose-Capillary: 133 mg/dL — ABNORMAL HIGH (ref 70–99)
Glucose-Capillary: 137 mg/dL — ABNORMAL HIGH (ref 70–99)

## 2022-07-27 LAB — BLOOD GAS, ARTERIAL
Acid-base deficit: 3.9 mmol/L — ABNORMAL HIGH (ref 0.0–2.0)
Bicarbonate: 19.5 mmol/L — ABNORMAL LOW (ref 20.0–28.0)
FIO2: 40 %
MECHVT: 400 mL
O2 Saturation: 94.8 %
PEEP: 5 cmH2O
Patient temperature: 37
RATE: 16 resp/min
pCO2 arterial: 30 mmHg — ABNORMAL LOW (ref 32–48)
pH, Arterial: 7.42 (ref 7.35–7.45)
pO2, Arterial: 72 mmHg — ABNORMAL LOW (ref 83–108)

## 2022-07-27 LAB — LACTIC ACID, PLASMA
Lactic Acid, Venous: 7.7 mmol/L (ref 0.5–1.9)
Lactic Acid, Venous: 5.9 mmol/L (ref 0.5–1.9)

## 2022-07-27 LAB — MAGNESIUM
Magnesium: 2.2 mg/dL (ref 1.7–2.4)
Magnesium: 2.2 mg/dL (ref 1.7–2.4)

## 2022-07-27 LAB — LIPASE, BLOOD: Lipase: 26 U/L (ref 11–51)

## 2022-07-27 LAB — BODY FLUID CULTURE W GRAM STAIN: Culture: NO GROWTH

## 2022-07-27 LAB — PHOSPHORUS: Phosphorus: 4.8 mg/dL — ABNORMAL HIGH (ref 2.5–4.6)

## 2022-07-27 LAB — POTASSIUM: Potassium: 5.5 mmol/L — ABNORMAL HIGH (ref 3.5–5.1)

## 2022-07-27 MED ORDER — PROPOFOL 1000 MG/100ML IV EMUL
0.0000 ug/kg/min | INTRAVENOUS | Status: DC
  Administered 2022-07-27 – 2022-07-28 (×2): 20 ug/kg/min via INTRAVENOUS
  Filled 2022-07-27 (×3): qty 100

## 2022-07-27 MED ORDER — ROCURONIUM BROMIDE 10 MG/ML (PF) SYRINGE
50.0000 mg | PREFILLED_SYRINGE | Freq: Once | INTRAVENOUS | Status: AC
  Administered 2022-07-27: 50 mg via INTRAVENOUS
  Filled 2022-07-27: qty 10

## 2022-07-27 MED ORDER — FENTANYL 2500MCG IN NS 250ML (10MCG/ML) PREMIX INFUSION
50.0000 ug/h | INTRAVENOUS | Status: DC
  Administered 2022-07-28 – 2022-07-30 (×4): 150 ug/h via INTRAVENOUS
  Filled 2022-07-27 (×4): qty 250

## 2022-07-27 MED ORDER — ORAL CARE MOUTH RINSE: 15.0000 mL | OROMUCOSAL | Status: DC | PRN

## 2022-07-27 MED ORDER — ORAL CARE MOUTH RINSE
15.0000 mL | OROMUCOSAL | Status: DC
  Administered 2022-07-27 (×4): 15 mL via OROMUCOSAL

## 2022-07-27 MED ORDER — PROPOFOL 1000 MG/100ML IV EMUL
INTRAVENOUS | Status: AC
  Administered 2022-07-27: 5 ug/kg/min via INTRAVENOUS
  Filled 2022-07-27: qty 100

## 2022-07-27 MED ORDER — FENTANYL CITRATE (PF) 100 MCG/2ML IJ SOLN
100.0000 ug | Freq: Once | INTRAMUSCULAR | Status: AC
  Administered 2022-07-27: 100 ug via INTRAVENOUS
  Filled 2022-07-27: qty 2

## 2022-07-27 MED ORDER — SODIUM CHLORIDE 0.9% FLUSH: 10.0000 mL | INTRAVENOUS | Status: DC | PRN

## 2022-07-27 MED ORDER — LACTATED RINGERS IV BOLUS
1000.0000 mL | Freq: Once | INTRAVENOUS | Status: AC
  Administered 2022-07-27: 1000 mL via INTRAVENOUS

## 2022-07-27 MED ORDER — ETOMIDATE 2 MG/ML IV SOLN
20.0000 mg | Freq: Once | INTRAVENOUS | Status: AC
  Administered 2022-07-27: 20 mg via INTRAVENOUS
  Filled 2022-07-27: qty 10

## 2022-07-27 MED ORDER — PANTOPRAZOLE SODIUM 40 MG IV SOLR
40.0000 mg | Freq: Every day | INTRAVENOUS | Status: DC
  Administered 2022-07-27 – 2022-07-30 (×4): 40 mg via INTRAVENOUS
  Filled 2022-07-27 (×4): qty 10

## 2022-07-27 MED ORDER — PIPERACILLIN-TAZOBACTAM 3.375 G IVPB
3.3750 g | Freq: Three times a day (TID) | INTRAVENOUS | Status: DC
  Administered 2022-07-27 – 2022-07-31 (×11): 3.375 g via INTRAVENOUS
  Filled 2022-07-27 (×11): qty 50

## 2022-07-27 MED ORDER — FENTANYL BOLUS VIA INFUSION: 50.0000 ug | INTRAVENOUS | Status: DC | PRN

## 2022-07-27 MED ORDER — LACTULOSE ENEMA
300.0000 mL | Freq: Two times a day (BID) | ORAL | Status: DC
  Administered 2022-07-27 – 2022-07-28 (×3): 300 mL via RECTAL
  Filled 2022-07-27 (×5): qty 300

## 2022-07-27 MED ORDER — FENTANYL 2500MCG IN NS 250ML (10MCG/ML) PREMIX INFUSION
INTRAVENOUS | Status: AC
  Administered 2022-07-27: 50 ug/h via INTRAVENOUS
  Filled 2022-07-27: qty 250

## 2022-07-27 MED ORDER — SODIUM CHLORIDE 0.9% FLUSH
10.0000 mL | Freq: Two times a day (BID) | INTRAVENOUS | Status: DC
  Administered 2022-07-27 – 2022-07-31 (×8): 10 mL

## 2022-07-27 MED ORDER — NOREPINEPHRINE 4 MG/250ML-% IV SOLN
INTRAVENOUS | Status: AC
  Filled 2022-07-27: qty 250

## 2022-07-27 MED ORDER — FOLIC ACID 5 MG/ML IJ SOLN
1.0000 mg | Freq: Every day | INTRAMUSCULAR | Status: DC
  Administered 2022-07-27 – 2022-07-31 (×5): 1 mg via INTRAVENOUS
  Filled 2022-07-27 (×5): qty 0.2

## 2022-07-28 ENCOUNTER — Encounter: Payer: Self-pay | Admitting: Pulmonary Disease

## 2022-07-28 ENCOUNTER — Inpatient Hospital Stay: Payer: Medicaid Other

## 2022-07-28 DIAGNOSIS — Z515 Encounter for palliative care: Secondary | ICD-10-CM

## 2022-07-28 DIAGNOSIS — Z7189 Other specified counseling: Secondary | ICD-10-CM

## 2022-07-28 DIAGNOSIS — J9601 Acute respiratory failure with hypoxia: Secondary | ICD-10-CM

## 2022-07-28 DIAGNOSIS — J1569 Pneumonia due to other gram-negative bacteria: Secondary | ICD-10-CM

## 2022-07-28 LAB — BASIC METABOLIC PANEL
Anion gap: 9 (ref 5–15)
BUN: 69 mg/dL — ABNORMAL HIGH (ref 6–20)
CO2: 21 mmol/L — ABNORMAL LOW (ref 22–32)
Calcium: 8.6 mg/dL — ABNORMAL LOW (ref 8.9–10.3)
Chloride: 108 mmol/L (ref 98–111)
Creatinine, Ser: 1.91 mg/dL — ABNORMAL HIGH (ref 0.61–1.24)
GFR, Estimated: 43 mL/min — ABNORMAL LOW (ref >60)
Glucose, Bld: 136 mg/dL — ABNORMAL HIGH (ref 70–99)
Potassium: 5.1 mmol/L (ref 3.5–5.1)
Sodium: 138 mmol/L (ref 135–145)

## 2022-07-28 LAB — COMPREHENSIVE METABOLIC PANEL
ALT: 29 U/L (ref 0–44)
AST: 45 U/L — ABNORMAL HIGH (ref 15–41)
Albumin: 2 g/dL — ABNORMAL LOW (ref 3.5–5.0)
Alkaline Phosphatase: 84 U/L (ref 38–126)
Anion gap: 5 (ref 5–15)
BUN: 59 mg/dL — ABNORMAL HIGH (ref 6–20)
CO2: 23 mmol/L (ref 22–32)
Calcium: 8.2 mg/dL — ABNORMAL LOW (ref 8.9–10.3)
Chloride: 109 mmol/L (ref 98–111)
Creatinine, Ser: 1.79 mg/dL — ABNORMAL HIGH (ref 0.61–1.24)
GFR, Estimated: 46 mL/min — ABNORMAL LOW (ref >60)
Glucose, Bld: 135 mg/dL — ABNORMAL HIGH (ref 70–99)
Potassium: 5.6 mmol/L — ABNORMAL HIGH (ref 3.5–5.1)
Sodium: 137 mmol/L (ref 135–145)
Total Bilirubin: 8 mg/dL — ABNORMAL HIGH (ref 0.3–1.2)
Total Protein: 5.9 g/dL — ABNORMAL LOW (ref 6.5–8.1)

## 2022-07-28 LAB — CBC
HCT: 37.9 % — ABNORMAL LOW (ref 39.0–52.0)
Hemoglobin: 12.8 g/dL — ABNORMAL LOW (ref 13.0–17.0)
MCH: 33.6 pg (ref 26.0–34.0)
MCHC: 33.8 g/dL (ref 30.0–36.0)
MCV: 99.5 fL (ref 80.0–100.0)
Platelets: 141 10*3/uL — ABNORMAL LOW (ref 150–400)
RBC: 3.81 MIL/uL — ABNORMAL LOW (ref 4.22–5.81)
RDW: 19.5 % — ABNORMAL HIGH (ref 11.5–15.5)
WBC: 15.9 10*3/uL — ABNORMAL HIGH (ref 4.0–10.5)
nRBC: 0.3 % — ABNORMAL HIGH (ref 0.0–0.2)

## 2022-07-28 LAB — GLUCOSE, CAPILLARY
Glucose-Capillary: 112 mg/dL — ABNORMAL HIGH (ref 70–99)
Glucose-Capillary: 114 mg/dL — ABNORMAL HIGH (ref 70–99)
Glucose-Capillary: 119 mg/dL — ABNORMAL HIGH (ref 70–99)
Glucose-Capillary: 121 mg/dL — ABNORMAL HIGH (ref 70–99)
Glucose-Capillary: 122 mg/dL — ABNORMAL HIGH (ref 70–99)
Glucose-Capillary: 123 mg/dL — ABNORMAL HIGH (ref 70–99)
Glucose-Capillary: 128 mg/dL — ABNORMAL HIGH (ref 70–99)

## 2022-07-28 LAB — HEPATITIS PANEL, ACUTE: Hep A IgM: NEGATIVE — AB

## 2022-07-28 LAB — AMMONIA: Ammonia: 238 umol/L — ABNORMAL HIGH (ref 9–35)

## 2022-07-28 LAB — PROTIME-INR
INR: 1.6 — ABNORMAL HIGH (ref 0.8–1.2)
Prothrombin Time: 18.4 seconds — ABNORMAL HIGH (ref 11.4–15.2)

## 2022-07-28 LAB — LACTIC ACID, PLASMA
Lactic Acid, Venous: 4.4 mmol/L (ref 0.5–1.9)
Lactic Acid, Venous: 3.7 mmol/L (ref 0.5–1.9)

## 2022-07-28 LAB — TRIGLYCERIDES: Triglycerides: 535 mg/dL — ABNORMAL HIGH (ref <150)

## 2022-07-28 LAB — MAGNESIUM: Magnesium: 2.3 mg/dL (ref 1.7–2.4)

## 2022-07-28 LAB — PROCALCITONIN: Procalcitonin: 1.64 ng/mL

## 2022-07-28 LAB — PHOSPHORUS: Phosphorus: 6.1 mg/dL — ABNORMAL HIGH (ref 2.5–4.6)

## 2022-07-28 LAB — POTASSIUM: Potassium: 4.8 mmol/L (ref 3.5–5.1)

## 2022-07-28 MED ORDER — SODIUM ZIRCONIUM CYCLOSILICATE 5 G PO PACK: 10.0000 g | PACK | Freq: Two times a day (BID) | ORAL | Status: DC

## 2022-07-28 MED ORDER — LACTULOSE ENEMA
300.0000 mL | Freq: Four times a day (QID) | ORAL | Status: DC
  Administered 2022-07-28 – 2022-07-30 (×7): 300 mL via RECTAL
  Filled 2022-07-28 (×9): qty 300

## 2022-07-28 MED ORDER — ORAL CARE MOUTH RINSE
15.0000 mL | OROMUCOSAL | Status: DC
  Administered 2022-07-28 – 2022-07-31 (×40): 15 mL via OROMUCOSAL

## 2022-07-28 MED ORDER — SODIUM CHLORIDE 0.9 % IV BOLUS
1000.0000 mL | Freq: Once | INTRAVENOUS | Status: AC
  Administered 2022-07-28: 1000 mL via INTRAVENOUS

## 2022-07-28 MED ORDER — DEXTROSE 50 % IV SOLN
1.0000 | Freq: Once | INTRAVENOUS | Status: AC
  Administered 2022-07-28: 50 mL via INTRAVENOUS
  Filled 2022-07-28: qty 50

## 2022-07-28 MED ORDER — SODIUM POLYSTYRENE SULFONATE 15 GM/60ML PO SUSP
60.0000 g | Freq: Once | ORAL | Status: AC
  Administered 2022-07-28: 60 g via RECTAL
  Filled 2022-07-28: qty 240

## 2022-07-28 MED ORDER — LACTULOSE ENEMA: 300.0000 mL | Freq: Four times a day (QID) | ORAL | Status: DC

## 2022-07-28 MED ORDER — INSULIN REGULAR HUMAN 100 UNIT/ML IJ SOLN
10.0000 [IU] | Freq: Once | INTRAMUSCULAR | Status: DC
  Filled 2022-07-28: qty 3

## 2022-07-28 MED ORDER — MIDAZOLAM-SODIUM CHLORIDE 100-0.9 MG/100ML-% IV SOLN
0.5000 mg/h | INTRAVENOUS | Status: DC
  Administered 2022-07-28: 0.5 mg/h via INTRAVENOUS
  Administered 2022-07-30: 2 mg/h via INTRAVENOUS
  Filled 2022-07-28 (×2): qty 100

## 2022-07-28 MED ORDER — INSULIN ASPART 100 UNIT/ML IV SOLN
10.0000 [IU] | Freq: Once | INTRAVENOUS | Status: AC
  Administered 2022-07-28: 10 [IU] via INTRAVENOUS
  Filled 2022-07-28: qty 0.1

## 2022-07-28 MED ORDER — ORAL CARE MOUTH RINSE: 15.0000 mL | OROMUCOSAL | Status: DC | PRN

## 2022-07-28 MED ORDER — INSULIN ASPART 100 UNIT/ML IJ SOLN
10.0000 [IU] | Freq: Once | INTRAMUSCULAR | Status: DC
  Filled 2022-07-28: qty 0.1

## 2022-07-28 MED ORDER — HEPARIN SODIUM (PORCINE) 5000 UNIT/ML IJ SOLN
5000.0000 [IU] | Freq: Three times a day (TID) | INTRAMUSCULAR | Status: DC
  Administered 2022-07-28 – 2022-07-29 (×4): 5000 [IU] via SUBCUTANEOUS
  Filled 2022-07-28 (×4): qty 1

## 2022-07-28 NOTE — Consult Note (Signed)
Consultation Note Date: 07/28/2022   Patient Name: Marco Spears  DOB: [birth year]  MRN: [id number]  Age / Sex: 46 y.o., male  PCP: No primary care provider on file. Referring Physician: Erin Fulling, MD  Reason for Consultation: Establishing goals of care  HPI/Patient Profile: 46 y.o. male  with past medical history of unknown past medical history admitted on 07/08/2022 with acute metabolic encephalopathy requiring intubation and suspected alcohol and methamphetamine intoxication also found to have cirrhosis with concern for hepatic encephalopathy.   Clinical Assessment and Goals of Care: I have reviewed medical records including EPIC notes, labs and imaging, received report from RN, assessed the patient.  Mr. Kloos is lying quietly in bed.  He is intubated/ventilated and sedated.  There is no family at bedside at this time although nursing staff is present attending to needs.  Call to brother/healthcare surrogate, Reva Bores, to discuss diagnosis prognosis, GOC, EOL wishes, disposition and options.  No answer, went right to voicemail.  Left voicemail message.  PMT to continue to follow.      Conference with attending, bedside nursing staff, transition of care team related to patient condition, needs, goals of care, disposition.   HCPOA NEXT OF KIN -brother, Zael Shuman.    SUMMARY OF RECOMMENDATIONS   At this point continue full scope/full code Time for outcomes   Code Status/Advance Care Planning: Full code  Symptom Management:  Per CCM, no additional needs at this time.  Palliative Prophylaxis:  Frequent Pain Assessment, Oral Care, and Turn Reposition  Additional Recommendations (Limitations, Scope, Preferences): Full Scope Treatment  Psycho-social/Spiritual:  Desire for further Chaplaincy support:no Additional Recommendations: Caregiving  Support/Resources and ICU Family  Guide  Prognosis:  Unable to determine, based on outcomes.  Guarded at this point.  Discharge Planning: To Be Determined      Primary Diagnoses: Present on Admission:  Altered mental status, unspecified   I have reviewed the medical record, interviewed the patient and family, and examined the patient. The following aspects are pertinent.  History reviewed. No pertinent past medical history. Social History   Socioeconomic History   Marital status: Not on file    Spouse name: Not on file   Number of children: Not on file   Years of education: Not on file   Highest education level: Not on file  Occupational History   Not on file  Tobacco Use   Smoking status: Not on file   Smokeless tobacco: Not on file  Substance and Sexual Activity   Alcohol use: Not on file   Drug use: Not on file   Sexual activity: Not on file  Other Topics Concern   Not on file  Social History Narrative   Not on file   Social Determinants of Health   Financial Resource Strain: Not on file  Food Insecurity: Not on file  Transportation Needs: Not on file  Physical Activity: Not on file  Stress: Not on file  Social Connections: Not on file   History reviewed. No pertinent family history. Scheduled  Meds:  Chlorhexidine Gluconate Cloth  6 each Topical H2094   folic acid  1 mg Intravenous Daily   heparin injection (subcutaneous)  5,000 Units Subcutaneous Q8H   insulin aspart  0-15 Units Subcutaneous Q4H   lactulose  300 mL Rectal BID   mouth rinse  15 mL Mouth Rinse Q2H   pantoprazole (PROTONIX) IV  40 mg Intravenous QHS   sodium chloride flush  10-40 mL Intracatheter Q12H   [START ON 07/29/2022] thiamine (VITAMIN B1) injection  100 mg Intravenous Daily   Continuous Infusions:  fentaNYL infusion INTRAVENOUS 150 mcg/hr (07/28/22 1200)   midazolam 2 mg/hr (07/28/22 1200)   piperacillin-tazobactam (ZOSYN)  IV 3.375 g (07/28/22 1309)   PRN Meds:.fentaNYL, morphine injection, mouth rinse, sodium  chloride flush Medications Prior to Admission:  Prior to Admission medications   Not on File   Not on File Review of Systems  Unable to perform ROS: Intubated    Physical Exam Vitals and nursing note reviewed.     Vital Signs: BP 116/72   Pulse (!) 117   Temp 97.9 F (36.6 C) (Rectal)   Resp 14   Ht 6' (1.829 m)   Wt 101.3 kg   SpO2 100%   BMI 30.29 kg/m  Pain Scale: CPOT   Pain Score: Asleep   SpO2: SpO2: 100 % O2 Device:SpO2: 100 % O2 Flow Rate: .O2 Flow Rate (L/min): 3 L/min  IO: Intake/output summary:  Intake/Output Summary (Last 24 hours) at 07/28/2022 1431 Last data filed at 07/28/2022 1200 Gross per 24 hour  Intake 2428.54 ml  Output 1415 ml  Net 1013.54 ml    LBM: Last BM Date : 07/28/22 Baseline Weight: Weight: 104.1 kg Most recent weight: Weight: 101.3 kg     Palliative Assessment/Data:   Flowsheet Rows    Flowsheet Row Most Recent Value  Intake Tab   Referral Department Hospitalist  Unit at Time of Referral ICU  Palliative Care Primary Diagnosis Neurology  Date Notified 07/27/22  Palliative Care Type New Palliative care  Reason for referral Clarify Goals of Care  Date of Admission 08/21/2022  Date first seen by Palliative Care 07/28/22  # of days Palliative referral response time 1 Day(s)  # of days IP prior to Palliative referral 4  Clinical Assessment   Palliative Performance Scale Score 10%  Pain Max last 24 hours Not able to report  Pain Min Last 24 hours Not able to report  Dyspnea Max Last 24 Hours Not able to report  Dyspnea Min Last 24 hours Not able to report  Psychosocial & Spiritual Assessment   Palliative Care Outcomes        Time In: 1015 Time Out: 1055 Time Total: 40 minutes  Greater than 50%  of this time was spent counseling and coordinating care related to the above assessment and plan.  Signed by: Drue Novel, NP   Please contact Palliative Medicine Team phone at [phone] for questions and concerns.  For  individual provider: See Shea Evans

## 2022-07-29 ENCOUNTER — Inpatient Hospital Stay: Payer: Medicaid Other

## 2022-07-29 DIAGNOSIS — F1510 Other stimulant abuse, uncomplicated: Secondary | ICD-10-CM

## 2022-07-29 LAB — COMPREHENSIVE METABOLIC PANEL
ALT: 33 U/L (ref 0–44)
AST: 36 U/L (ref 15–41)
Albumin: 1.9 g/dL — ABNORMAL LOW (ref 3.5–5.0)
Alkaline Phosphatase: 73 U/L (ref 38–126)
Anion gap: 2 — ABNORMAL LOW (ref 5–15)
BUN: 70 mg/dL — ABNORMAL HIGH (ref 6–20)
CO2: 24 mmol/L (ref 22–32)
Calcium: 8.3 mg/dL — ABNORMAL LOW (ref 8.9–10.3)
Chloride: 115 mmol/L — ABNORMAL HIGH (ref 98–111)
Creatinine, Ser: 1.68 mg/dL — ABNORMAL HIGH (ref 0.61–1.24)
GFR, Estimated: 50 mL/min — ABNORMAL LOW (ref >60)
Glucose, Bld: 128 mg/dL — ABNORMAL HIGH (ref 70–99)
Potassium: 4.5 mmol/L (ref 3.5–5.1)
Sodium: 141 mmol/L (ref 135–145)
Total Bilirubin: 7.5 mg/dL — ABNORMAL HIGH (ref 0.3–1.2)
Total Protein: 5.6 g/dL — ABNORMAL LOW (ref 6.5–8.1)

## 2022-07-29 LAB — CBC
HCT: 34.8 % — ABNORMAL LOW (ref 39.0–52.0)
Hemoglobin: 11.1 g/dL — ABNORMAL LOW (ref 13.0–17.0)
MCH: 32.5 pg (ref 26.0–34.0)
MCHC: 31.9 g/dL (ref 30.0–36.0)
MCV: 101.8 fL — ABNORMAL HIGH (ref 80.0–100.0)
Platelets: 90 10*3/uL — ABNORMAL LOW (ref 150–400)
RBC: 3.42 MIL/uL — ABNORMAL LOW (ref 4.22–5.81)
RDW: 20.4 % — ABNORMAL HIGH (ref 11.5–15.5)
WBC: 8.6 10*3/uL (ref 4.0–10.5)
nRBC: 0 % (ref 0.0–0.2)

## 2022-07-29 LAB — LACTIC ACID, PLASMA: Lactic Acid, Venous: 1.8 mmol/L (ref 0.5–1.9)

## 2022-07-29 LAB — GLUCOSE, CAPILLARY
Glucose-Capillary: 110 mg/dL — ABNORMAL HIGH (ref 70–99)
Glucose-Capillary: 118 mg/dL — ABNORMAL HIGH (ref 70–99)
Glucose-Capillary: 109 mg/dL — ABNORMAL HIGH (ref 70–99)
Glucose-Capillary: 110 mg/dL — ABNORMAL HIGH (ref 70–99)
Glucose-Capillary: 114 mg/dL — ABNORMAL HIGH (ref 70–99)
Glucose-Capillary: 114 mg/dL — ABNORMAL HIGH (ref 70–99)

## 2022-07-29 LAB — MAGNESIUM: Magnesium: 2.3 mg/dL (ref 1.7–2.4)

## 2022-07-29 LAB — PROCALCITONIN: Procalcitonin: 0.7 ng/mL

## 2022-07-29 LAB — PHOSPHORUS: Phosphorus: 5 mg/dL — ABNORMAL HIGH (ref 2.5–4.6)

## 2022-07-29 MED ORDER — STERILE WATER FOR INJECTION IJ SOLN
INTRAMUSCULAR | Status: AC
  Filled 2022-07-29: qty 10

## 2022-07-29 MED ORDER — FREE WATER
30.0000 mL | Status: DC
  Administered 2022-07-29 – 2022-07-31 (×8): 30 mL

## 2022-07-29 MED ORDER — ACETAMINOPHEN 10 MG/ML IV SOLN
1000.0000 mg | Freq: Once | INTRAVENOUS | Status: AC
  Administered 2022-07-29: 1000 mg via INTRAVENOUS
  Filled 2022-07-29: qty 100

## 2022-07-29 MED ORDER — IOHEXOL 9 MG/ML PO SOLN
500.0000 mL | ORAL | Status: AC
  Administered 2022-07-29: 500 mL

## 2022-07-29 MED ORDER — VITAL 1.5 CAL PO LIQD
1000.0000 mL | ORAL | Status: DC
  Administered 2022-07-29: 1000 mL

## 2022-07-29 MED ORDER — IOHEXOL 9 MG/ML PO SOLN
500.0000 mL | ORAL | Status: DC
  Administered 2022-07-29: 500 mL via ORAL

## 2022-07-30 ENCOUNTER — Inpatient Hospital Stay: Payer: Medicaid Other

## 2022-07-30 DIAGNOSIS — T43622A Poisoning by amphetamines, intentional self-harm, initial encounter: Secondary | ICD-10-CM

## 2022-07-30 LAB — CBC
HCT: 33.2 % — ABNORMAL LOW (ref 39.0–52.0)
Hemoglobin: 10.5 g/dL — ABNORMAL LOW (ref 13.0–17.0)
MCH: 32.3 pg (ref 26.0–34.0)
MCHC: 31.6 g/dL (ref 30.0–36.0)
MCV: 102.2 fL — ABNORMAL HIGH (ref 80.0–100.0)
Platelets: 72 10*3/uL — ABNORMAL LOW (ref 150–400)
RBC: 3.25 MIL/uL — ABNORMAL LOW (ref 4.22–5.81)
RDW: 20.8 % — ABNORMAL HIGH (ref 11.5–15.5)
WBC: 7 10*3/uL (ref 4.0–10.5)
nRBC: 0 % (ref 0.0–0.2)

## 2022-07-30 LAB — GLUCOSE, CAPILLARY
Glucose-Capillary: 109 mg/dL — ABNORMAL HIGH (ref 70–99)
Glucose-Capillary: 112 mg/dL — ABNORMAL HIGH (ref 70–99)
Glucose-Capillary: 115 mg/dL — ABNORMAL HIGH (ref 70–99)
Glucose-Capillary: 119 mg/dL — ABNORMAL HIGH (ref 70–99)
Glucose-Capillary: 119 mg/dL — ABNORMAL HIGH (ref 70–99)
Glucose-Capillary: 121 mg/dL — ABNORMAL HIGH (ref 70–99)

## 2022-07-30 LAB — COMPREHENSIVE METABOLIC PANEL
ALT: 34 U/L (ref 0–44)
AST: 48 U/L — ABNORMAL HIGH (ref 15–41)
Albumin: 1.8 g/dL — ABNORMAL LOW (ref 3.5–5.0)
Alkaline Phosphatase: 69 U/L (ref 38–126)
Anion gap: 8 (ref 5–15)
BUN: 66 mg/dL — ABNORMAL HIGH (ref 6–20)
CO2: 25 mmol/L (ref 22–32)
Calcium: 8 mg/dL — ABNORMAL LOW (ref 8.9–10.3)
Chloride: 110 mmol/L (ref 98–111)
Creatinine, Ser: 1.39 mg/dL — ABNORMAL HIGH (ref 0.61–1.24)
GFR, Estimated: >60 mL/min (ref >60)
Glucose, Bld: 123 mg/dL — ABNORMAL HIGH (ref 70–99)
Potassium: 4.5 mmol/L (ref 3.5–5.1)
Sodium: 143 mmol/L (ref 135–145)
Total Bilirubin: 8.4 mg/dL — ABNORMAL HIGH (ref 0.3–1.2)
Total Protein: 5.6 g/dL — ABNORMAL LOW (ref 6.5–8.1)

## 2022-07-30 LAB — PHOSPHORUS: Phosphorus: 4.4 mg/dL (ref 2.5–4.6)

## 2022-07-30 LAB — PROCALCITONIN: Procalcitonin: 0.51 ng/mL

## 2022-07-30 LAB — MAGNESIUM: Magnesium: 2.7 mg/dL — ABNORMAL HIGH (ref 1.7–2.4)

## 2022-07-30 LAB — AMMONIA: Ammonia: 146 umol/L — ABNORMAL HIGH (ref 9–35)

## 2022-07-30 MED ORDER — LACTULOSE 10 GM/15ML PO SOLN
30.0000 g | Freq: Three times a day (TID) | ORAL | Status: DC
  Administered 2022-07-30 – 2022-07-31 (×4): 30 g
  Filled 2022-07-30 (×4): qty 60

## 2022-07-30 MED ORDER — VITAL 1.5 CAL PO LIQD
1000.0000 mL | ORAL | Status: DC
  Administered 2022-07-30 – 2022-07-31 (×2): 1000 mL

## 2022-07-30 MED ORDER — PROSOURCE TF20 ENFIT COMPATIBL EN LIQD
60.0000 mL | Freq: Two times a day (BID) | ENTERAL | Status: DC
  Filled 2022-07-30: qty 60

## 2022-07-30 MED ORDER — ALBUMIN HUMAN 25 % IV SOLN
25.0000 g | Freq: Once | INTRAVENOUS | Status: AC
  Administered 2022-07-30: 25 g via INTRAVENOUS
  Filled 2022-07-30: qty 100

## 2022-07-30 MED ORDER — JUVEN PO PACK: 1.0000 | PACK | Freq: Two times a day (BID) | ORAL | Status: DC

## 2022-07-31 ENCOUNTER — Inpatient Hospital Stay: Payer: Medicaid Other

## 2022-07-31 DIAGNOSIS — K7200 Acute and subacute hepatic failure without coma: Principal | ICD-10-CM

## 2022-07-31 LAB — CULTURE, BLOOD (ROUTINE X 2)
Culture: NO GROWTH
Culture: NO GROWTH
Special Requests: ADEQUATE
Special Requests: ADEQUATE

## 2022-07-31 LAB — GLUCOSE, CAPILLARY
Glucose-Capillary: 142 mg/dL — ABNORMAL HIGH (ref 70–99)
Glucose-Capillary: 87 mg/dL (ref 70–99)
Glucose-Capillary: 69 mg/dL — ABNORMAL LOW (ref 70–99)
Glucose-Capillary: 76 mg/dL (ref 70–99)
Glucose-Capillary: 62 mg/dL — ABNORMAL LOW (ref 70–99)
Glucose-Capillary: 63 mg/dL — ABNORMAL LOW (ref 70–99)
Glucose-Capillary: 75 mg/dL (ref 70–99)
Glucose-Capillary: 59 mg/dL — ABNORMAL LOW (ref 70–99)
Glucose-Capillary: 134 mg/dL — ABNORMAL HIGH (ref 70–99)
Glucose-Capillary: 92 mg/dL (ref 70–99)

## 2022-07-31 LAB — BLOOD GAS, ARTERIAL
Acid-base deficit: 8.8 mmol/L — ABNORMAL HIGH (ref 0.0–2.0)
Bicarbonate: 16.7 mmol/L — ABNORMAL LOW (ref 20.0–28.0)
FIO2: 40 %
MECHVT: 500 mL
Mechanical Rate: 18
O2 Saturation: 98.7 %
PEEP: 5 cmH2O
Patient temperature: 37
pCO2 arterial: 34 mmHg (ref 32–48)
pH, Arterial: 7.3 — ABNORMAL LOW (ref 7.35–7.45)
pO2, Arterial: 113 mmHg — ABNORMAL HIGH (ref 83–108)

## 2022-07-31 LAB — CBC
HCT: 37.4 % — ABNORMAL LOW (ref 39.0–52.0)
HCT: 36.9 % — ABNORMAL LOW (ref 39.0–52.0)
Hemoglobin: 11.6 g/dL — ABNORMAL LOW (ref 13.0–17.0)
Hemoglobin: 11.2 g/dL — ABNORMAL LOW (ref 13.0–17.0)
MCH: 32.7 pg (ref 26.0–34.0)
MCH: 33.4 pg (ref 26.0–34.0)
MCHC: 31 g/dL (ref 30.0–36.0)
MCHC: 30.4 g/dL (ref 30.0–36.0)
MCV: 105.4 fL — ABNORMAL HIGH (ref 80.0–100.0)
MCV: 110.1 fL — ABNORMAL HIGH (ref 80.0–100.0)
Platelets: 105 10*3/uL — ABNORMAL LOW (ref 150–400)
Platelets: 132 10*3/uL — ABNORMAL LOW (ref 150–400)
RBC: 3.55 MIL/uL — ABNORMAL LOW (ref 4.22–5.81)
RBC: 3.35 MIL/uL — ABNORMAL LOW (ref 4.22–5.81)
RDW: 21.9 % — ABNORMAL HIGH (ref 11.5–15.5)
RDW: 22.4 % — ABNORMAL HIGH (ref 11.5–15.5)
WBC: 7.8 10*3/uL (ref 4.0–10.5)
WBC: 20.9 10*3/uL — ABNORMAL HIGH (ref 4.0–10.5)
nRBC: 0.3 % — ABNORMAL HIGH (ref 0.0–0.2)
nRBC: 0.2 % (ref 0.0–0.2)

## 2022-07-31 LAB — DIFFERENTIAL
Abs Immature Granulocytes: 0.36 10*3/uL — ABNORMAL HIGH (ref 0.00–0.07)
Basophils Absolute: 0 10*3/uL (ref 0.0–0.1)
Basophils Relative: 0 %
Eosinophils Absolute: 0 10*3/uL (ref 0.0–0.5)
Eosinophils Relative: 0 %
Immature Granulocytes: 2 %
Lymphocytes Relative: 5 %
Lymphs Abs: 1 10*3/uL (ref 0.7–4.0)
Monocytes Absolute: 1.3 10*3/uL — ABNORMAL HIGH (ref 0.1–1.0)
Monocytes Relative: 6 %
Neutro Abs: 18.2 10*3/uL — ABNORMAL HIGH (ref 1.7–7.7)
Neutrophils Relative %: 87 %

## 2022-07-31 LAB — PHOSPHORUS
Phosphorus: 3.6 mg/dL (ref 2.5–4.6)
Phosphorus: 6.8 mg/dL — ABNORMAL HIGH (ref 2.5–4.6)

## 2022-07-31 LAB — COMPREHENSIVE METABOLIC PANEL
ALT: 35 U/L (ref 0–44)
ALT: 74 U/L — ABNORMAL HIGH (ref 0–44)
AST: 60 U/L — ABNORMAL HIGH (ref 15–41)
AST: 211 U/L — ABNORMAL HIGH (ref 15–41)
Albumin: 2.3 g/dL — ABNORMAL LOW (ref 3.5–5.0)
Albumin: 2 g/dL — ABNORMAL LOW (ref 3.5–5.0)
Alkaline Phosphatase: 83 U/L (ref 38–126)
Alkaline Phosphatase: 71 U/L (ref 38–126)
Anion gap: 7 (ref 5–15)
Anion gap: 18 — ABNORMAL HIGH (ref 5–15)
BUN: 58 mg/dL — ABNORMAL HIGH (ref 6–20)
BUN: 71 mg/dL — ABNORMAL HIGH (ref 6–20)
CO2: 26 mmol/L (ref 22–32)
CO2: 18 mmol/L — ABNORMAL LOW (ref 22–32)
Calcium: 8.7 mg/dL — ABNORMAL LOW (ref 8.9–10.3)
Calcium: 8.2 mg/dL — ABNORMAL LOW (ref 8.9–10.3)
Chloride: 112 mmol/L — ABNORMAL HIGH (ref 98–111)
Chloride: 111 mmol/L (ref 98–111)
Creatinine, Ser: 1.39 mg/dL — ABNORMAL HIGH (ref 0.61–1.24)
Creatinine, Ser: 2.48 mg/dL — ABNORMAL HIGH (ref 0.61–1.24)
GFR, Estimated: >60 mL/min (ref >60)
GFR, Estimated: 31 mL/min — ABNORMAL LOW (ref >60)
Glucose, Bld: 135 mg/dL — ABNORMAL HIGH (ref 70–99)
Glucose, Bld: 154 mg/dL — ABNORMAL HIGH (ref 70–99)
Potassium: 5.1 mmol/L (ref 3.5–5.1)
Potassium: 6.8 mmol/L (ref 3.5–5.1)
Sodium: 145 mmol/L (ref 135–145)
Sodium: 147 mmol/L — ABNORMAL HIGH (ref 135–145)
Total Bilirubin: 8.3 mg/dL — ABNORMAL HIGH (ref 0.3–1.2)
Total Bilirubin: 7.9 mg/dL — ABNORMAL HIGH (ref 0.3–1.2)
Total Protein: 6.4 g/dL — ABNORMAL LOW (ref 6.5–8.1)
Total Protein: 5.7 g/dL — ABNORMAL LOW (ref 6.5–8.1)

## 2022-07-31 LAB — CULTURE, RESPIRATORY W GRAM STAIN

## 2022-07-31 LAB — PROTIME-INR
INR: 2 — ABNORMAL HIGH (ref 0.8–1.2)
Prothrombin Time: 22.4 seconds — ABNORMAL HIGH (ref 11.4–15.2)

## 2022-07-31 LAB — MAGNESIUM
Magnesium: 2.7 mg/dL — ABNORMAL HIGH (ref 1.7–2.4)
Magnesium: 3 mg/dL — ABNORMAL HIGH (ref 1.7–2.4)

## 2022-07-31 LAB — TYPE AND SCREEN
ABO/RH(D): O NEG
Antibody Screen: NEGATIVE

## 2022-07-31 LAB — APTT: aPTT: 36 seconds (ref 24–36)

## 2022-07-31 LAB — BILIRUBIN, DIRECT: Bilirubin, Direct: 4.4 mg/dL — ABNORMAL HIGH (ref 0.0–0.2)

## 2022-07-31 MED ORDER — DOCUSATE SODIUM 50 MG/5ML PO LIQD: 100.0000 mg | Freq: Two times a day (BID) | ORAL | Status: DC

## 2022-07-31 MED ORDER — FENTANYL 2500MCG IN NS 250ML (10MCG/ML) PREMIX INFUSION
INTRAVENOUS | Status: AC
  Administered 2022-07-31: 50 ug/h via INTRAVENOUS
  Filled 2022-07-31: qty 250

## 2022-07-31 MED ORDER — POLYVINYL ALCOHOL 1.4 % OP SOLN: 1.0000 [drp] | Freq: Four times a day (QID) | OPHTHALMIC | Status: DC | PRN

## 2022-07-31 MED ORDER — ACETAMINOPHEN 650 MG RE SUPP: 650.0000 mg | Freq: Four times a day (QID) | RECTAL | Status: DC | PRN

## 2022-07-31 MED ORDER — PROPOFOL 1000 MG/100ML IV EMUL
INTRAVENOUS | Status: AC
  Administered 2022-07-31: 40 ug/kg/min via INTRAVENOUS
  Filled 2022-07-31: qty 100

## 2022-07-31 MED ORDER — MIDAZOLAM-SODIUM CHLORIDE 100-0.9 MG/100ML-% IV SOLN
0.5000 mg/h | INTRAVENOUS | Status: DC
  Administered 2022-08-01: 10 mg/h via INTRAVENOUS
  Filled 2022-07-31: qty 100

## 2022-07-31 MED ORDER — ACETAMINOPHEN 325 MG PO TABS
ORAL_TABLET | ORAL | Status: AC
  Filled 2022-07-31: qty 2

## 2022-07-31 MED ORDER — ONDANSETRON HCL 4 MG/2ML IJ SOLN: 4.0000 mg | Freq: Once | INTRAMUSCULAR | Status: AC

## 2022-07-31 MED ORDER — INSULIN ASPART 100 UNIT/ML IV SOLN
10.0000 [IU] | Freq: Once | INTRAVENOUS | Status: AC
  Administered 2022-07-31: 10 [IU] via INTRAVENOUS
  Filled 2022-07-31: qty 0.1

## 2022-07-31 MED ORDER — HALOPERIDOL 0.5 MG PO TABS: 0.5000 mg | ORAL_TABLET | ORAL | Status: DC | PRN

## 2022-07-31 MED ORDER — DEXTROSE 50 % IV SOLN
INTRAVENOUS | Status: AC
  Administered 2022-07-31: 50 mL via INTRAVENOUS
  Filled 2022-07-31: qty 50

## 2022-07-31 MED ORDER — HALOPERIDOL LACTATE 5 MG/ML IJ SOLN
0.5000 mg | INTRAMUSCULAR | Status: DC | PRN
  Administered 2022-08-01: 0.5 mg via INTRAVENOUS
  Filled 2022-07-31: qty 1

## 2022-07-31 MED ORDER — DEXTROSE 50 % IV SOLN: 12.5000 g | INTRAVENOUS | Status: AC

## 2022-07-31 MED ORDER — DEXTROSE 50 % IV SOLN
1.0000 | Freq: Once | INTRAVENOUS | Status: AC
  Administered 2022-07-31: 50 mL via INTRAVENOUS
  Filled 2022-07-31: qty 50

## 2022-07-31 MED ORDER — ACETAMINOPHEN 325 MG PO TABS: 650.0000 mg | ORAL_TABLET | Freq: Four times a day (QID) | ORAL | Status: DC | PRN

## 2022-07-31 MED ORDER — PROPOFOL 1000 MG/100ML IV EMUL
5.0000 ug/kg/min | INTRAVENOUS | Status: DC
  Administered 2022-07-31: 10 ug/kg/min via INTRAVENOUS
  Filled 2022-07-31: qty 100

## 2022-07-31 MED ORDER — MIDAZOLAM-SODIUM CHLORIDE 100-0.9 MG/100ML-% IV SOLN
INTRAVENOUS | Status: AC
  Administered 2022-07-31: 0.5 mg/h via INTRAVENOUS
  Filled 2022-07-31: qty 100

## 2022-07-31 MED ORDER — FENTANYL BOLUS VIA INFUSION
50.0000 ug | INTRAVENOUS | Status: DC | PRN
  Administered 2022-07-31: 50 ug via INTRAVENOUS
  Administered 2022-07-31: 100 ug via INTRAVENOUS
  Administered 2022-07-31: 50 ug via INTRAVENOUS
  Administered 2022-08-01 (×4): 100 ug via INTRAVENOUS

## 2022-07-31 MED ORDER — GLYCOPYRROLATE 0.2 MG/ML IJ SOLN: 0.2000 mg | INTRAMUSCULAR | Status: DC | PRN

## 2022-07-31 MED ORDER — DEXTROSE 50 % IV SOLN: 1.0000 | Freq: Once | INTRAVENOUS | Status: AC

## 2022-07-31 MED ORDER — PIPERACILLIN-TAZOBACTAM IN DEX 2-0.25 GM/50ML IV SOLN
2.2500 g | Freq: Four times a day (QID) | INTRAVENOUS | Status: DC
  Administered 2022-07-31: 2.25 g via INTRAVENOUS
  Filled 2022-07-31 (×2): qty 50

## 2022-07-31 MED ORDER — LEVETIRACETAM IN NACL 1000 MG/100ML IV SOLN: 1000.0000 mg | Freq: Once | INTRAVENOUS | Status: DC

## 2022-07-31 MED ORDER — ACETAMINOPHEN 325 MG PO TABS
ORAL_TABLET | ORAL | Status: AC
  Filled 2022-07-31: qty 1

## 2022-07-31 MED ORDER — NOREPINEPHRINE 16 MG/250ML-% IV SOLN
0.0000 ug/min | INTRAVENOUS | Status: DC
  Administered 2022-07-31: 2 ug/min via INTRAVENOUS
  Filled 2022-07-31: qty 250

## 2022-07-31 MED ORDER — LORAZEPAM 2 MG/ML IJ SOLN
INTRAMUSCULAR | Status: AC
  Filled 2022-07-31: qty 1

## 2022-07-31 MED ORDER — NOREPINEPHRINE 16 MG/250ML-% IV SOLN
0.0000 ug/min | INTRAVENOUS | Status: DC
  Filled 2022-07-31: qty 250

## 2022-07-31 MED ORDER — PROPOFOL 1000 MG/100ML IV EMUL
5.0000 ug/kg/min | INTRAVENOUS | Status: DC
  Administered 2022-07-31: 80 ug/kg/min via INTRAVENOUS
  Filled 2022-07-31: qty 100

## 2022-07-31 MED ORDER — PROPOFOL 1000 MG/100ML IV EMUL
INTRAVENOUS | Status: AC
  Administered 2022-07-31: 25 ug/kg/min via INTRAVENOUS
  Filled 2022-07-31: qty 100

## 2022-07-31 MED ORDER — FENTANYL 2500MCG IN NS 250ML (10MCG/ML) PREMIX INFUSION: 0.0000 ug/h | INTRAVENOUS | Status: DC

## 2022-07-31 MED ORDER — IBUPROFEN 100 MG/5ML PO SUSP
600.0000 mg | Freq: Once | ORAL | Status: AC
  Administered 2022-07-31: 600 mg
  Filled 2022-07-31 (×2): qty 30

## 2022-07-31 MED ORDER — THIAMINE HCL 100 MG/ML IJ SOLN: 100.0000 mg | Freq: Every day | INTRAMUSCULAR | Status: DC

## 2022-07-31 MED ORDER — ACETAMINOPHEN 325 MG PO TABS
325.0000 mg | ORAL_TABLET | Freq: Once | ORAL | Status: AC
  Administered 2022-07-31: 325 mg

## 2022-07-31 MED ORDER — MORPHINE SULFATE (PF) 2 MG/ML IV SOLN: 2.0000 mg | INTRAVENOUS | Status: DC | PRN

## 2022-07-31 MED ORDER — MORPHINE 100MG IN NS 100ML (1MG/ML) PREMIX INFUSION
2.0000 mg/h | INTRAVENOUS | Status: DC
  Administered 2022-08-01: 5 mg/h via INTRAVENOUS
  Filled 2022-07-31: qty 100

## 2022-07-31 MED ORDER — HALOPERIDOL LACTATE 2 MG/ML PO CONC: 0.5000 mg | ORAL | Status: DC | PRN

## 2022-07-31 MED ORDER — MIDAZOLAM HCL 2 MG/2ML IJ SOLN
2.0000 mg | INTRAMUSCULAR | Status: DC | PRN
  Administered 2022-07-31 (×2): 2 mg via INTRAVENOUS
  Filled 2022-07-31: qty 2

## 2022-07-31 MED ORDER — MORPHINE BOLUS VIA INFUSION
4.0000 mg | INTRAVENOUS | Status: DC | PRN
  Administered 2022-08-01 (×3): 4 mg via INTRAVENOUS

## 2022-07-31 MED ORDER — LEVETIRACETAM IN NACL 500 MG/100ML IV SOLN
500.0000 mg | Freq: Two times a day (BID) | INTRAVENOUS | Status: DC
  Filled 2022-07-31: qty 100

## 2022-07-31 MED ORDER — LORAZEPAM 2 MG/ML IJ SOLN
2.0000 mg | Freq: Once | INTRAMUSCULAR | Status: AC
  Administered 2022-07-31: 2 mg via INTRAVENOUS

## 2022-07-31 MED ORDER — ACETAMINOPHEN 325 MG PO TABS: 325.0000 mg | ORAL_TABLET | Freq: Once | ORAL | Status: DC

## 2022-07-31 MED ORDER — ONDANSETRON HCL 4 MG/2ML IJ SOLN
INTRAMUSCULAR | Status: AC
  Administered 2022-07-31: 4 mg via INTRAVENOUS
  Filled 2022-07-31: qty 2

## 2022-07-31 MED ORDER — ONDANSETRON HCL 4 MG/2ML IJ SOLN: 4.0000 mg | Freq: Four times a day (QID) | INTRAMUSCULAR | Status: DC | PRN

## 2022-07-31 MED ORDER — DEXTROSE 50 % IV SOLN
INTRAVENOUS | Status: AC
  Administered 2022-07-31: 12.5 g via INTRAVENOUS
  Filled 2022-07-31: qty 50

## 2022-07-31 MED ORDER — GLYCOPYRROLATE 1 MG PO TABS: 1.0000 mg | ORAL_TABLET | ORAL | Status: DC | PRN

## 2022-07-31 MED ORDER — POLYETHYLENE GLYCOL 3350 17 G PO PACK: 17.0000 g | PACK | Freq: Every day | ORAL | Status: DC

## 2022-07-31 MED ORDER — FENTANYL CITRATE PF 50 MCG/ML IJ SOSY: 50.0000 ug | PREFILLED_SYRINGE | Freq: Once | INTRAMUSCULAR | Status: DC

## 2022-07-31 MED ORDER — MIDAZOLAM HCL 2 MG/2ML IJ SOLN
2.0000 mg | INTRAMUSCULAR | Status: DC | PRN
  Administered 2022-07-31 – 2022-08-01 (×9): 2 mg via INTRAVENOUS
  Filled 2022-07-31 (×3): qty 2

## 2022-07-31 MED ORDER — ONDANSETRON 4 MG PO TBDP: 4.0000 mg | ORAL_TABLET | Freq: Four times a day (QID) | ORAL | Status: DC | PRN

## 2022-07-31 MED ORDER — LEVETIRACETAM IN NACL 500 MG/100ML IV SOLN
500.0000 mg | Freq: Once | INTRAVENOUS | Status: AC
  Administered 2022-07-31: 500 mg via INTRAVENOUS
  Filled 2022-07-31: qty 100

## 2022-07-31 MED ORDER — BIOTENE DRY MOUTH MT LIQD: 15.0000 mL | OROMUCOSAL | Status: DC | PRN

## 2022-07-31 MED ORDER — ACETAMINOPHEN 325 MG PO TABS
650.0000 mg | ORAL_TABLET | Freq: Once | ORAL | Status: AC
  Administered 2022-07-31: 650 mg via NASOGASTRIC

## 2022-07-31 MED ORDER — VECURONIUM BROMIDE 10 MG IV SOLR: 10.0000 mg | Freq: Once | INTRAVENOUS | Status: DC

## 2022-07-31 MED ORDER — FENTANYL CITRATE PF 50 MCG/ML IJ SOSY
50.0000 ug | PREFILLED_SYRINGE | INTRAMUSCULAR | Status: DC | PRN
  Administered 2022-07-31: 50 ug via INTRAVENOUS
  Filled 2022-07-31: qty 1

## 2022-08-01 LAB — CK: Total CK: 91 U/L (ref 49–397)

## 2022-08-01 LAB — HEMOGLOBIN A1C
Hgb A1c MFr Bld: 3.8 % — ABNORMAL LOW (ref 4.8–5.6)
Mean Plasma Glucose: 62.36 mg/dL

## 2022-08-01 LAB — AMYLASE: Amylase: 48 U/L (ref 28–100)

## 2022-08-01 LAB — LIPASE, BLOOD: Lipase: 26 U/L (ref 11–51)

## 2022-08-01 LAB — TROPONIN I (HIGH SENSITIVITY): Troponin I (High Sensitivity): 18 ng/L — ABNORMAL HIGH (ref <18)

## 2022-08-01 MED ORDER — SODIUM CHLORIDE 0.9 % IV SOLN
2.0000 mg/h | INTRAVENOUS | Status: DC
  Filled 2022-08-01: qty 10

## 2022-08-01 MED ORDER — MORPHINE 100MG IN NS 100ML (1MG/ML) PREMIX INFUSION: 2.0000 mg/h | INTRAVENOUS | Status: DC

## 2022-08-02 LAB — BLOOD CULTURE ID PANEL (REFLEXED) - BCID2

## 2022-08-03 ENCOUNTER — Encounter: Payer: Self-pay | Admitting: Emergency Medicine

## 2022-08-03 LAB — CKMB (ARMC ONLY): CK, MB: 1.3 ng/mL (ref 0.5–5.0)

## 2022-08-04 LAB — CULTURE, BAL-QUANTITATIVE W GRAM STAIN: Culture: >=100000 — AB

## 2022-08-04 LAB — CULTURE, BLOOD (ROUTINE X 2): Special Requests: ADEQUATE

## 2022-08-05 LAB — CULTURE, BLOOD (ROUTINE X 2)
Culture: NO GROWTH
Special Requests: ADEQUATE

## 2022-08-26 DEATH — deceased
